# Patient Record
Sex: Female | Born: 1955 | Race: White | Hispanic: No | Marital: Single | Smoking: Former smoker
Health system: Southern US, Community
[De-identification: ages and names within clinical notes are randomized; demographics above are authoritative.]

## PROBLEM LIST (undated history)

## (undated) DIAGNOSIS — M1990 Unspecified osteoarthritis, unspecified site: Secondary | ICD-10-CM

## (undated) DIAGNOSIS — I471 Supraventricular tachycardia, unspecified: Secondary | ICD-10-CM

## (undated) DIAGNOSIS — M810 Age-related osteoporosis without current pathological fracture: Secondary | ICD-10-CM

## (undated) DIAGNOSIS — Z973 Presence of spectacles and contact lenses: Secondary | ICD-10-CM

## (undated) DIAGNOSIS — K219 Gastro-esophageal reflux disease without esophagitis: Secondary | ICD-10-CM

## (undated) DIAGNOSIS — D649 Anemia, unspecified: Secondary | ICD-10-CM

## (undated) DIAGNOSIS — T7840XA Allergy, unspecified, initial encounter: Secondary | ICD-10-CM

## (undated) DIAGNOSIS — N951 Menopausal and female climacteric states: Secondary | ICD-10-CM

## (undated) DIAGNOSIS — M797 Fibromyalgia: Secondary | ICD-10-CM

## (undated) DIAGNOSIS — Z9221 Personal history of antineoplastic chemotherapy: Secondary | ICD-10-CM

## (undated) DIAGNOSIS — K279 Peptic ulcer, site unspecified, unspecified as acute or chronic, without hemorrhage or perforation: Secondary | ICD-10-CM

## (undated) DIAGNOSIS — I499 Cardiac arrhythmia, unspecified: Secondary | ICD-10-CM

## (undated) DIAGNOSIS — I10 Essential (primary) hypertension: Secondary | ICD-10-CM

## (undated) DIAGNOSIS — B1920 Unspecified viral hepatitis C without hepatic coma: Secondary | ICD-10-CM

## (undated) DIAGNOSIS — Z5189 Encounter for other specified aftercare: Secondary | ICD-10-CM

## (undated) DIAGNOSIS — F17200 Nicotine dependence, unspecified, uncomplicated: Secondary | ICD-10-CM

## (undated) DIAGNOSIS — F419 Anxiety disorder, unspecified: Secondary | ICD-10-CM

## (undated) DIAGNOSIS — G709 Myoneural disorder, unspecified: Secondary | ICD-10-CM

## (undated) DIAGNOSIS — F32A Depression, unspecified: Secondary | ICD-10-CM

## (undated) DIAGNOSIS — J449 Chronic obstructive pulmonary disease, unspecified: Secondary | ICD-10-CM

## (undated) DIAGNOSIS — M069 Rheumatoid arthritis, unspecified: Secondary | ICD-10-CM

## (undated) DIAGNOSIS — F329 Major depressive disorder, single episode, unspecified: Secondary | ICD-10-CM

## (undated) DIAGNOSIS — C801 Malignant (primary) neoplasm, unspecified: Secondary | ICD-10-CM

## (undated) HISTORY — PX: ABDOMINAL HYSTERECTOMY: SHX81

## (undated) HISTORY — DX: Rheumatoid arthritis, unspecified: M06.9

## (undated) HISTORY — DX: Myoneural disorder, unspecified: G70.9

## (undated) HISTORY — DX: Encounter for other specified aftercare: Z51.89

## (undated) HISTORY — DX: Anxiety disorder, unspecified: F41.9

## (undated) HISTORY — DX: Fibromyalgia: M79.7

## (undated) HISTORY — DX: Unspecified osteoarthritis, unspecified site: M19.90

## (undated) HISTORY — PX: COLONOSCOPY: SHX174

## (undated) HISTORY — DX: Depression, unspecified: F32.A

## (undated) HISTORY — DX: Supraventricular tachycardia, unspecified: I47.10

## (undated) HISTORY — PX: DILATION AND CURETTAGE OF UTERUS: SHX78

## (undated) HISTORY — DX: Peptic ulcer, site unspecified, unspecified as acute or chronic, without hemorrhage or perforation: K27.9

## (undated) HISTORY — DX: Anemia, unspecified: D64.9

## (undated) HISTORY — PX: JOINT REPLACEMENT: SHX530

## (undated) HISTORY — DX: Menopausal and female climacteric states: N95.1

## (undated) HISTORY — DX: Major depressive disorder, single episode, unspecified: F32.9

## (undated) HISTORY — DX: Allergy, unspecified, initial encounter: T78.40XA

## (undated) HISTORY — PX: UPPER GI ENDOSCOPY: SHX6162

## (undated) HISTORY — DX: Gastro-esophageal reflux disease without esophagitis: K21.9

## (undated) HISTORY — DX: Supraventricular tachycardia: I47.1

## (undated) HISTORY — DX: Age-related osteoporosis without current pathological fracture: M81.0

## (undated) HISTORY — PX: BREAST SURGERY: SHX581

---
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
## 1984-03-20 HISTORY — PX: NASAL SINUS SURGERY: SHX719

## 1998-05-26 ENCOUNTER — Encounter: Payer: Self-pay | Admitting: Cardiology

## 1998-05-26 ENCOUNTER — Ambulatory Visit (HOSPITAL_COMMUNITY): Admission: RE | Admit: 1998-05-26 | Discharge: 1998-05-26 | Payer: Self-pay | Admitting: Cardiology

## 1998-10-29 ENCOUNTER — Emergency Department (HOSPITAL_COMMUNITY): Admission: EM | Admit: 1998-10-29 | Discharge: 1998-10-29 | Payer: Self-pay | Admitting: Emergency Medicine

## 1999-06-29 ENCOUNTER — Ambulatory Visit (HOSPITAL_COMMUNITY): Admission: RE | Admit: 1999-06-29 | Discharge: 1999-06-29 | Payer: Self-pay | Admitting: Cardiology

## 1999-06-29 ENCOUNTER — Encounter: Payer: Self-pay | Admitting: Cardiology

## 1999-08-15 ENCOUNTER — Ambulatory Visit (HOSPITAL_COMMUNITY): Admission: RE | Admit: 1999-08-15 | Discharge: 1999-08-15 | Payer: Self-pay | Admitting: Cardiology

## 1999-08-15 ENCOUNTER — Encounter: Payer: Self-pay | Admitting: Cardiology

## 1999-08-27 ENCOUNTER — Other Ambulatory Visit: Admission: RE | Admit: 1999-08-27 | Discharge: 1999-08-27 | Payer: Self-pay | Admitting: Obstetrics and Gynecology

## 2000-07-11 ENCOUNTER — Other Ambulatory Visit: Admission: RE | Admit: 2000-07-11 | Discharge: 2000-07-11 | Payer: Self-pay | Admitting: *Deleted

## 2000-07-11 ENCOUNTER — Encounter (INDEPENDENT_AMBULATORY_CARE_PROVIDER_SITE_OTHER): Payer: Self-pay | Admitting: Specialist

## 2000-09-17 HISTORY — PX: OTHER SURGICAL HISTORY: SHX169

## 2000-09-30 ENCOUNTER — Other Ambulatory Visit: Admission: RE | Admit: 2000-09-30 | Discharge: 2000-09-30 | Payer: Self-pay | Admitting: *Deleted

## 2000-09-30 ENCOUNTER — Encounter (INDEPENDENT_AMBULATORY_CARE_PROVIDER_SITE_OTHER): Payer: Self-pay

## 2002-05-18 ENCOUNTER — Ambulatory Visit (HOSPITAL_COMMUNITY): Admission: RE | Admit: 2002-05-18 | Discharge: 2002-05-18 | Payer: Self-pay | Admitting: *Deleted

## 2002-05-18 ENCOUNTER — Encounter: Payer: Self-pay | Admitting: *Deleted

## 2002-06-25 ENCOUNTER — Encounter: Payer: Self-pay | Admitting: *Deleted

## 2002-06-25 ENCOUNTER — Ambulatory Visit (HOSPITAL_COMMUNITY): Admission: RE | Admit: 2002-06-25 | Discharge: 2002-06-25 | Payer: Self-pay | Admitting: *Deleted

## 2002-06-26 ENCOUNTER — Inpatient Hospital Stay (HOSPITAL_COMMUNITY): Admission: EM | Admit: 2002-06-26 | Discharge: 2002-06-29 | Payer: Self-pay | Admitting: Emergency Medicine

## 2002-07-20 ENCOUNTER — Encounter: Admission: RE | Admit: 2002-07-20 | Discharge: 2002-07-20 | Payer: Self-pay | Admitting: Sports Medicine

## 2002-09-18 ENCOUNTER — Encounter (INDEPENDENT_AMBULATORY_CARE_PROVIDER_SITE_OTHER): Payer: Self-pay | Admitting: *Deleted

## 2002-10-14 ENCOUNTER — Encounter (INDEPENDENT_AMBULATORY_CARE_PROVIDER_SITE_OTHER): Payer: Self-pay | Admitting: Family Medicine

## 2002-10-14 ENCOUNTER — Encounter: Admission: RE | Admit: 2002-10-14 | Discharge: 2002-10-14 | Payer: Self-pay | Admitting: Family Medicine

## 2002-10-28 ENCOUNTER — Encounter: Payer: Self-pay | Admitting: Sports Medicine

## 2002-10-28 ENCOUNTER — Encounter: Admission: RE | Admit: 2002-10-28 | Discharge: 2002-10-28 | Payer: Self-pay | Admitting: Sports Medicine

## 2003-11-18 DIAGNOSIS — N951 Menopausal and female climacteric states: Secondary | ICD-10-CM

## 2003-11-18 HISTORY — DX: Menopausal and female climacteric states: N95.1

## 2003-12-02 ENCOUNTER — Encounter: Admission: RE | Admit: 2003-12-02 | Discharge: 2003-12-02 | Payer: Self-pay | Admitting: Family Medicine

## 2004-04-16 ENCOUNTER — Encounter: Admission: RE | Admit: 2004-04-16 | Discharge: 2004-04-16 | Payer: Self-pay | Admitting: Cardiology

## 2004-06-14 ENCOUNTER — Other Ambulatory Visit: Admission: RE | Admit: 2004-06-14 | Discharge: 2004-06-14 | Payer: Self-pay | Admitting: *Deleted

## 2005-09-03 ENCOUNTER — Encounter: Admission: RE | Admit: 2005-09-03 | Discharge: 2005-09-03 | Payer: Self-pay | Admitting: Cardiology

## 2006-01-02 ENCOUNTER — Other Ambulatory Visit: Admission: RE | Admit: 2006-01-02 | Discharge: 2006-01-02 | Payer: Self-pay | Admitting: *Deleted

## 2006-01-05 ENCOUNTER — Encounter: Admission: RE | Admit: 2006-01-05 | Discharge: 2006-01-05 | Payer: Self-pay | Admitting: Orthopedic Surgery

## 2006-01-22 ENCOUNTER — Encounter: Admission: RE | Admit: 2006-01-22 | Discharge: 2006-01-22 | Payer: Self-pay | Admitting: *Deleted

## 2006-07-17 DIAGNOSIS — K219 Gastro-esophageal reflux disease without esophagitis: Secondary | ICD-10-CM | POA: Insufficient documentation

## 2006-07-17 DIAGNOSIS — B1710 Acute hepatitis C without hepatic coma: Secondary | ICD-10-CM

## 2006-07-17 DIAGNOSIS — B1910 Unspecified viral hepatitis B without hepatic coma: Secondary | ICD-10-CM | POA: Insufficient documentation

## 2006-07-18 ENCOUNTER — Encounter (INDEPENDENT_AMBULATORY_CARE_PROVIDER_SITE_OTHER): Payer: Self-pay | Admitting: *Deleted

## 2007-02-16 ENCOUNTER — Emergency Department (HOSPITAL_COMMUNITY): Admission: EM | Admit: 2007-02-16 | Discharge: 2007-02-16 | Payer: Self-pay | Admitting: Emergency Medicine

## 2007-02-20 ENCOUNTER — Ambulatory Visit: Payer: Self-pay | Admitting: Cardiology

## 2007-03-16 ENCOUNTER — Ambulatory Visit: Payer: Self-pay

## 2007-03-16 ENCOUNTER — Encounter: Payer: Self-pay | Admitting: Cardiology

## 2007-04-10 ENCOUNTER — Ambulatory Visit: Payer: Self-pay | Admitting: Cardiology

## 2008-04-07 ENCOUNTER — Ambulatory Visit: Payer: Self-pay | Admitting: Cardiology

## 2008-07-23 ENCOUNTER — Emergency Department (HOSPITAL_COMMUNITY): Admission: EM | Admit: 2008-07-23 | Discharge: 2008-07-23 | Payer: Self-pay | Admitting: Emergency Medicine

## 2009-01-12 ENCOUNTER — Encounter (INDEPENDENT_AMBULATORY_CARE_PROVIDER_SITE_OTHER): Payer: Self-pay | Admitting: *Deleted

## 2009-01-16 ENCOUNTER — Telehealth: Payer: Self-pay | Admitting: Cardiology

## 2009-01-20 ENCOUNTER — Ambulatory Visit: Payer: Self-pay | Admitting: Oncology

## 2009-02-02 LAB — ERYTHROCYTE SEDIMENTATION RATE: Sed Rate: 32 mm/hr — ABNORMAL HIGH (ref 0–30)

## 2009-02-02 LAB — CBC WITH DIFFERENTIAL/PLATELET
BASO%: 0.2 % (ref 0.0–2.0)
HCT: 37.1 % (ref 34.8–46.6)
MCHC: 34.2 g/dL (ref 31.5–36.0)
MONO#: 0.5 10*3/uL (ref 0.1–0.9)
NEUT%: 65.6 % (ref 38.4–76.8)
RBC: 4.38 10*6/uL (ref 3.70–5.45)
WBC: 10.7 10*3/uL — ABNORMAL HIGH (ref 3.9–10.3)
lymph#: 3 10*3/uL (ref 0.9–3.3)

## 2009-02-02 LAB — CHCC SMEAR

## 2009-02-03 LAB — COMPREHENSIVE METABOLIC PANEL
ALT: 15 U/L (ref 0–35)
Albumin: 4.1 g/dL (ref 3.5–5.2)
CO2: 23 mEq/L (ref 19–32)
Calcium: 9 mg/dL (ref 8.4–10.5)
Chloride: 104 mEq/L (ref 96–112)
Potassium: 3.8 mEq/L (ref 3.5–5.3)
Sodium: 138 mEq/L (ref 135–145)
Total Bilirubin: 0.5 mg/dL (ref 0.3–1.2)
Total Protein: 7 g/dL (ref 6.0–8.3)

## 2009-02-03 LAB — JAK2 GENOTYPR: JAK2 GenotypR: NOT DETECTED

## 2009-02-28 ENCOUNTER — Ambulatory Visit: Payer: Self-pay | Admitting: Oncology

## 2009-03-02 LAB — IRON AND TIBC
%SAT: 19 % — ABNORMAL LOW (ref 20–55)
Iron: 68 ug/dL (ref 42–145)

## 2009-03-02 LAB — CBC WITH DIFFERENTIAL/PLATELET
Basophils Absolute: 0 10*3/uL (ref 0.0–0.1)
Eosinophils Absolute: 0.3 10*3/uL (ref 0.0–0.5)
HGB: 12.9 g/dL (ref 11.6–15.9)
MCV: 84.9 fL (ref 79.5–101.0)
MONO%: 5.2 % (ref 0.0–14.0)
NEUT#: 5.4 10*3/uL (ref 1.5–6.5)
Platelets: 316 10*3/uL (ref 145–400)
RDW: 13.1 % (ref 11.2–14.5)

## 2009-03-14 ENCOUNTER — Ambulatory Visit: Payer: Self-pay | Admitting: Gastroenterology

## 2009-03-14 ENCOUNTER — Encounter (INDEPENDENT_AMBULATORY_CARE_PROVIDER_SITE_OTHER): Payer: Self-pay | Admitting: *Deleted

## 2009-03-30 ENCOUNTER — Telehealth (INDEPENDENT_AMBULATORY_CARE_PROVIDER_SITE_OTHER): Payer: Self-pay | Admitting: *Deleted

## 2009-04-06 ENCOUNTER — Ambulatory Visit: Payer: Self-pay | Admitting: Gastroenterology

## 2009-04-06 ENCOUNTER — Ambulatory Visit (HOSPITAL_COMMUNITY): Admission: RE | Admit: 2009-04-06 | Discharge: 2009-04-06 | Payer: Self-pay | Admitting: Gastroenterology

## 2009-04-10 ENCOUNTER — Encounter: Payer: Self-pay | Admitting: Gastroenterology

## 2009-06-16 ENCOUNTER — Ambulatory Visit: Payer: Self-pay | Admitting: Cardiology

## 2009-06-16 DIAGNOSIS — IMO0001 Reserved for inherently not codable concepts without codable children: Secondary | ICD-10-CM

## 2009-06-16 DIAGNOSIS — I471 Supraventricular tachycardia: Secondary | ICD-10-CM | POA: Insufficient documentation

## 2009-06-16 DIAGNOSIS — F17200 Nicotine dependence, unspecified, uncomplicated: Secondary | ICD-10-CM | POA: Insufficient documentation

## 2009-07-06 ENCOUNTER — Ambulatory Visit (HOSPITAL_COMMUNITY): Admission: RE | Admit: 2009-07-06 | Discharge: 2009-07-06 | Payer: Self-pay | Admitting: Cardiology

## 2009-07-06 ENCOUNTER — Ambulatory Visit: Payer: Self-pay

## 2009-07-06 ENCOUNTER — Ambulatory Visit: Payer: Self-pay | Admitting: Cardiovascular Disease

## 2009-07-06 ENCOUNTER — Encounter: Payer: Self-pay | Admitting: Cardiology

## 2010-03-22 ENCOUNTER — Telehealth: Payer: Self-pay | Admitting: Cardiology

## 2010-05-24 ENCOUNTER — Ambulatory Visit: Admit: 2010-05-24 | Payer: Self-pay | Admitting: Cardiology

## 2010-06-19 NOTE — Assessment & Plan Note (Signed)
Summary: f1y/dm  Medications Added FERROUS SULFATE 325 (65 FE) MG TABS (FERROUS SULFATE) as needed MIRALAX  POWD (POLYETHYLENE GLYCOL 3350) as needed CITRUCEL  POWD (METHYLCELLULOSE (LAXATIVE)) as needed        Referring Provider:  n/a Primary Provider:  Abbe Amsterdam, MD    History of Present Illness: [name] is a pleasant  female who I have seen in the pastsecondary to a documented SVT.  She apparently had an episode in the ambulance and was documented to be as high as 200, but the strips are not available. Stress echocardiogram in October 2008 showed normal LV function and there was no ischemia. Previous monitor in October of 2008 showed sinus with occasional PACs and PVCs. I last saw her in November of 2009. Since then she apparently has been diagnosed with fibromyalgia. There is also a question of rheumatoid arthritis. She has significant dyspnea on exertion by her report. It is relieved with rest. It is not associated with chest pain. There is no orthopnea, PND or pedal edema. She also complains of significant fatigue. Note she occasionally feels her heart "skipping" but she has not had sustained palpitations similar to her SVT symptoms.  Current Medications (verified): 1)  Atenolol 50 Mg Tabs (Atenolol) .... One Tablet By Mouth Two Times A Day 2)  Savella 50 Mg Tabs (Milnacipran Hcl) .... One Tablet By Mouth Two Times A Day 3)  Xanax 0.5 Mg Tabs (Alprazolam) .... One Tablet By Mouth Two Times A Day 4)  Oxycodone-Acetaminophen 7.5-325 Mg Tabs (Oxycodone-Acetaminophen) .... One Tablet By Mouth Four Times Daily 5)  Ferrous Sulfate 325 (65 Fe) Mg Tabs (Ferrous Sulfate) .... As Needed 6)  Vitamin D 1000 Unit Tabs (Cholecalciferol) .... One Tablet By Mouth Three Times A Day 7)  Senior Multivitamin Plus  Tabs (Multiple Vitamins-Minerals) .... One Tablet By Mouth Once Daily 8)  Prilosec Otc 20 Mg Tbec (Omeprazole Magnesium) .... One Tablet By Mouth Once Daily 9)  Ibuprofen 200 Mg Tabs  (Ibuprofen) .... As Needed 10)  Aleve 220 Mg Tabs (Naproxen Sodium) .... As Needed 11)  Senna-Plus 8.6-50 Mg Tabs (Sennosides-Docusate Sodium) .... As Needed For Constipaiton 12)  Lyrica 50 Mg Caps (Pregabalin) .Marland Kitchen.. 1 By Mouth Two Times A Day 13)  Miralax  Powd (Polyethylene Glycol 3350) .... As Needed 14)  Citrucel  Powd (Methylcellulose (Laxative)) .... As Needed  Allergies: 1)  ! 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 Normand Sloop with  6-pack on weekend); H/o 12-pack/day EtOH abuse in 1993; Had prescription drug abuse problem  1993; Has 2 dogs and 5 cats; Did inpatient substance abuse rehab in 1993    Review of Systems       Patient complain of fatigue and symptoms of fibromyalgia but no  fevers or chills, productive cough, hemoptysis, dysphasia, odynophagia, melena, hematochezia, dysuria, hematuria, rash, seizure activity, orthopnea, PND, pedal edema, claudication. Remaining systems are negative.   Vital Signs:  Patient profile:   55 year old female Height:      64 inches Weight:      170 pounds BMI:      29.29 Pulse rate:   73 / minute Resp:     14 per minute BP sitting:   102 / 70  (left arm)  Vitals Entered By: Kem Parkinson (June 16, 2009 10:06 AM)  Physical Exam  General:  Well-developed well-nourished in no acute distress.  Skin is warm and dry.  HEENT is normal.  Neck is supple. No thyromegaly.  Chest is clear to auscultation with normal expansion.  Cardiovascular exam is regular rate and rhythm.  Abdominal exam nontender or distended. No masses palpated. Extremities show no edema. neuro grossly intact    EKG  Procedure date:  06/16/2009  Findings:      Sinus rhythm at a rate of 73. Axis normal. No ST changes noted.  Impression & Recommendations:  Problem # 1:  PSVT (ICD-427.0) Symptoms are reasonably well controlled on atenolol. Will continue. If they worsen in the future then we will consider a repeat monitor. Her updated medication list for this problem includes:    Atenolol 50 Mg Tabs (Atenolol) ..... One tablet by mouth two times a day  Problem # 2:  TOBACCO ABUSE (ICD-305.1) Patient counseled on discontinuing for between 3-10 minutes.  Problem # 3:  DYSPNEA (ICD-786.05) This may be related to deconditioning. She is not volume overloaded on examination. Check echocardiogram to quantify LV function. Her updated medication list for this problem includes:    Atenolol 50 Mg Tabs (Atenolol) ..... One tablet by mouth two times a day  Problem # 4:  FIBROMYALGIA (ICD-729.1) Management per primary care.  Problem # 5:  HEPATITIS C (ICD-070.51)  Problem # 6:  GASTROESOPHAGEAL REFLUX, NO ESOPHAGITIS (ICD-530.81)  Her updated medication list for this problem includes:    Prilosec Otc 20 Mg Tbec (Omeprazole magnesium) ..... One tablet by mouth once daily  Other Orders: Echocardiogram (Echo)  Patient Instructions: 1)  Your physician recommends that you schedule a follow-up appointment in: ONE YEAR 2)  Your physician has requested that you have an  echocardiogram.  Echocardiography is a painless test that uses sound waves to create images of your heart. It provides your doctor with information about the size and shape of your heart and how well your heart's chambers and valves are working.  This procedure takes approximately one hour. There are no restrictions for this procedure.

## 2010-06-19 NOTE — Progress Notes (Signed)
Summary: SOB   Phone Note Call from Patient Call back at [facility] Phone [phone]   Caller: Patient Summary of Call: SOB Initial call taken by: Judie Grieve,  March 22, 2010 2:45 PM  Follow-up for Phone Call        lmtcb Scherrie Bateman, LPN  March 27, 2010 5:08 PM SPOKE WITH PT MADE APPT WITH DR Jens Som FOR JAN 2012  STATES MOTHER JUST EXPIRED ATTRIBUTES COMPLAINTS TO  THE LOSS. INSTRUCTED IF S/S WORSEN TO CALL BACK TO GET EARLIER APPT PT VERBALIZED UNDERSTANDING. Follow-up by: Scherrie Bateman, LPN,  March 30, 2010 9:15 AM  Additional Follow-up for Phone Call Additional follow up Details #1::        ok Ferman Hamming, MD, [facility]  March 30, 2010 9:17 AM

## 2010-07-17 ENCOUNTER — Encounter: Payer: Self-pay | Admitting: Cardiology

## 2010-07-17 ENCOUNTER — Ambulatory Visit (INDEPENDENT_AMBULATORY_CARE_PROVIDER_SITE_OTHER): Payer: Self-pay | Admitting: Cardiology

## 2010-07-17 DIAGNOSIS — R0602 Shortness of breath: Secondary | ICD-10-CM

## 2010-07-17 DIAGNOSIS — I471 Supraventricular tachycardia: Secondary | ICD-10-CM

## 2010-07-26 NOTE — Assessment & Plan Note (Signed)
Summary: per check [email or web address]  Medications Added XANAX 0.5 MG TABS (ALPRAZOLAM) one tablet by mouth three times a day OXYCODONE-ACETAMINOPHEN 10-325 MG TABS (OXYCODONE-ACETAMINOPHEN) 4 times daily LYRICA 100 MG CAPS (PREGABALIN) 1 tab by mouth two times a day ENBREL 50 MG/ML SOLN (ETANERCEPT) weekly        Referring Provider:  n/a Primary Provider:  Abbe Amsterdam, MD   CC:  sob.  History of Present Illness: [name] is a pleasant  female who I have seen in the pastsecondary to a documented SVT.  She apparently had an episode in the ambulance and was documented to be as high as 200, but the strips are not available. Stress echocardiogram in October 2008 showed normal LV function and there was no ischemia. Previous monitor in October of 2008 showed sinus with occasional PACs and PVCs. Echocardiogram in February of 2011 showed normal LV function. I last saw her in Jan 2011. Since then, the patient describes continuing dyspnea both with exertion and at rest. There is no orthopnea or PND. She does not have exertional chest pain. She has had 2 brief episodes of palpitations but her SVT is reasonably well controlled.  Current Medications (verified): 1)  Atenolol 50 Mg Tabs (Atenolol) .... One Tablet By Mouth Two Times A Day 2)  Xanax 0.5 Mg Tabs (Alprazolam) .... One Tablet By Mouth Three Times A Day 3)  Oxycodone-Acetaminophen 10-325 Mg Tabs (Oxycodone-Acetaminophen) .... 4 Times Daily 4)  Ferrous Sulfate 325 (65 Fe) Mg Tabs (Ferrous Sulfate) .... As Needed 5)  Vitamin D 1000 Unit Tabs (Cholecalciferol) .... One Tablet By Mouth Three Times A Day 6)  Senior Multivitamin Plus  Tabs (Multiple Vitamins-Minerals) .... One Tablet By Mouth Once Daily 7)  Prilosec Otc 20 Mg Tbec (Omeprazole Magnesium) .... One Tablet By Mouth Once Daily 8)  Ibuprofen 200 Mg Tabs (Ibuprofen) .... As Needed 9)  Aleve 220 Mg Tabs (Naproxen Sodium) .... As Needed 10)  Senna-Plus 8.6-50 Mg Tabs  (Sennosides-Docusate Sodium) .... As Needed For Constipaiton 11)  Lyrica 100 Mg Caps (Pregabalin) .Marland Kitchen.. 1 Tab By Mouth Two Times A Day 12)  Miralax  Powd (Polyethylene Glycol 3350) .... As Needed 13)  Citrucel  Powd (Methylcellulose (Laxative)) .... As Needed 14)  Enbrel 50 Mg/ml Soln (Etanercept) .... Weekly  Allergies: 1)  ! 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 C-section - 09/17/1980, D&C -, Hysterectomy--for dysmenorrhea - 09/17/2000, Sinus surgery for deviated septum - 05/21/1983, Tonsillectomy - 05/20/1961  Social History: Reviewed history from 03/14/2009 and no changes required. 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 Normand Sloop with  6-pack on weekend); H/o 12-pack/day EtOH abuse in 1993; Had prescription drug abuse problem  1993; Has 2 dogs and 5 cats; Did inpatient substance abuse rehab in 1993    Review of Systems       Multiple complaints including dyspnea, fibromyalgia but no fevers or chills, productive cough, hemoptysis, dysphasia, odynophagia,  melena, hematochezia, dysuria, hematuria, rash, seizure activity, orthopnea, PND, pedal edema, claudication. Remaining systems are negative.   Vital Signs:  Patient profile:   55 year old female Height:      64 inches Weight:      179 pounds BMI:      30.84 Pulse rate:   66 / minute Resp:     14 per minute BP sitting:   115 / 70  (right arm)  Vitals Entered By: Kem Parkinson (July 17, 2010 12:27 PM)  Physical Exam  General:  Well-developed well-nourished in no acute distress.  Skin is warm and dry.  HEENT is normal.  Neck is supple. No thyromegaly.  Chest is clear to auscultation with normal expansion.  Cardiovascular exam is regular rate and rhythm.  Abdominal exam nontender or distended. No masses palpated. Extremities show no edema. neuro grossly intact    EKG  Procedure date:  07/17/2010  Findings:      Sinus rhythm at a rate of 66. Minor nonspecific ST changes.  Impression & Recommendations:  Problem # 1:  DYSPNEA (ICD-786.05)  LV function normal. Not volume overloaded on exam. I do not think is cardiac related. Her updated medication list for this problem includes:    Atenolol 50 Mg Tabs (Atenolol) ..... One tablet by mouth two times a day  Her updated medication list for this problem includes:    Atenolol 50 Mg Tabs (Atenolol) ..... One tablet by mouth two times a day  Problem # 2:  TOBACCO ABUSE (ICD-305.1) Patient counseled on discontinuing.  Problem # 3:  FIBROMYALGIA (ICD-729.1) Management per primary care.  Problem # 4:  PSVT (ICD-427.0)  Continue beta blocker. Her updated medication list for this problem includes:    Atenolol 50 Mg Tabs (Atenolol) ..... One tablet by mouth two times a day  Her updated medication list for this problem includes:    Atenolol 50 Mg Tabs (Atenolol) ..... One tablet by mouth two times a day  Patient Instructions: 1)  Your physician wants you to follow-up in: ONE YEAR   You will receive a reminder letter in the mail two months in advance. If you don't receive a letter, please call our office to schedule the follow-up appointment. Prescriptions: ATENOLOL 50 MG TABS (ATENOLOL) one tablet by mouth two times a day  #180 x 4   Entered by:   Deliah Goody, RN    Authorized by:   Ferman Hamming, MD, [facility]   Signed by:   Deliah Goody, RN on 07/17/2010   Method used:   Electronically to        [facility] (retail)       [address]       [place], [place]  [address]       Ph: [phone] or [phone]       Fax: [phone]   RxID:   [id number]

## 2010-08-22 LAB — DIFFERENTIAL
Basophils Absolute: 0 10*3/uL (ref 0.0–0.1)
Basophils Relative: 0 % (ref 0–1)
Neutro Abs: 5.8 10*3/uL (ref 1.7–7.7)
Neutrophils Relative %: 53 % (ref 43–77)

## 2010-08-22 LAB — CBC
MCHC: 34.1 g/dL (ref 30.0–36.0)
RDW: 14 % (ref 11.5–15.5)

## 2010-08-30 LAB — URINALYSIS, ROUTINE W REFLEX MICROSCOPIC
Glucose, UA: NEGATIVE mg/dL
Hgb urine dipstick: NEGATIVE
Specific Gravity, Urine: 1.007 (ref 1.005–1.030)
pH: 5.5 (ref 5.0–8.0)

## 2010-08-30 LAB — BODY FLUID CULTURE: Culture: NO GROWTH

## 2010-08-30 LAB — DIFFERENTIAL
Eosinophils Relative: 2 % (ref 0–5)
Lymphocytes Relative: 21 % (ref 12–46)
Lymphs Abs: 2.9 10*3/uL (ref 0.7–4.0)
Neutro Abs: 10.2 10*3/uL — ABNORMAL HIGH (ref 1.7–7.7)

## 2010-08-30 LAB — BASIC METABOLIC PANEL
BUN: 12 mg/dL (ref 6–23)
Calcium: 9.3 mg/dL (ref 8.4–10.5)
GFR calc non Af Amer: >60 mL/min (ref >60)
Potassium: 4.6 mEq/L (ref 3.5–5.1)
Sodium: 140 mEq/L (ref 135–145)

## 2010-08-30 LAB — SYNOVIAL CELL COUNT + DIFF, W/ CRYSTALS
Crystals, Fluid: NONE SEEN
Lymphocytes-Synovial Fld: 2 % (ref 0–20)

## 2010-08-30 LAB — LACTATE DEHYDROGENASE, PLEURAL OR PERITONEAL FLUID: LD, Fluid: 265 U/L — ABNORMAL HIGH (ref 3–23)

## 2010-08-30 LAB — CBC
HCT: 42 % (ref 36.0–46.0)
Hemoglobin: 14.7 g/dL (ref 12.0–15.0)
Platelets: 278 10*3/uL (ref 150–400)
WBC: 14.1 10*3/uL — ABNORMAL HIGH (ref 4.0–10.5)

## 2010-08-30 LAB — PROTEIN, BODY FLUID: Total protein, fluid: 3 g/dL

## 2010-10-02 NOTE — Assessment & Plan Note (Signed)
[facility] OFFICE NOTE   NAME:FIELDSAnalie, [name]                        MRN:          [id number]  DATE:04/10/2007                            DOB:          Mar 05, 1956    [name] is a very pleasant 55 year old female who I recently saw on  February 20, 2007.  She has a history of what sounds to be SVT.  On  September 29, she apparently had an episode and was taken to the  emergency room at [facility].  She had documented SVT per the  EMS record, but I do not have those strips available.  It broke before  she reached the hospital.  Since I last saw her, we did schedule her to  have a stress echocardiogram which was performed on March 16, 2007.  The stress echo was negative based on the dictated report by Dr.  Dietrich Pates.  She also had a transthoracic echocardiogram on October 27  that showed normal left ventricular function and no other significant  abnormalities were noted.  A CardioNet monitor showed normal sinus  rhythm with premature atrial contractions and premature ventricular  contractions, but there were no sustained arrhythmias.  However, note  she did not have any episodes of sustained palpitations during that  time.  Since I saw her, she occasionally feels her heart skip, but  there were no sustained arrhythmias.   MEDICATIONS:  1. Atenolol 25 mg p.o. b.i.d.  2. Prilosec over the counter.   PHYSICAL EXAMINATION:  Blood pressure of 126/90 and pulse was 78.  She  weighs 155 pounds.  Her HEENT is normal.  Her neck is supple.  Her chest is clear.  CARDIOVASCULAR EXAM:  Reveals a regular rate and rhythm.  ABDOMINAL EXAM:  Shows no tenderness.  EXTREMITIES:  Show no edema.   DIAGNOSES:  1. Probable supraventricular tachycardia:  [name] had no sustained      arrhythmias while she had her CardioNet monitor.  However,      description is consistent with supraventricular tachycardia, and  she apparently had this documented en route to [facility]      with previous episode.  We were not able to obtain those strips.      We have discussed the options today, and we have decided to      increase her atenolol to 50 mg in the morning and 25 in the      evening.  If her symptoms worsen in the future, then we could      consider ablation at that time.  If she does have these symptoms      without resolving, we would like for her to be seen for an      electrocardiogram or rhythm strip to document her arrhythmia.  2. History of peptic ulcer disease:  She will continue on Prilosec.  3. Tobacco abuse:  We discussed the importance of discontinuing this.   I will see her back in approximately 6 months.     Madolyn Frieze Jens Som, MD, [facility]  Electronically  Signed    BSC/MedQ  DD: 04/10/2007  DT: 04/10/2007  Job #: [id number]   cc:   Pearline Cables, MD

## 2010-10-02 NOTE — Assessment & Plan Note (Signed)
[facility] OFFICE NOTE   NAME:FIELDSRetaj, [name]                        MRN:          [id number]  DATE:02/20/2007                            DOB:          February 08, 1956    [name] Crawford is a very pleasant 55 year old female who we are asked to  evaluate for supraventricular tachycardia.   The patient states that since age 44, she has had intermittent  palpitations.  They are sudden in onset and described as her heart  racing.  Her heart rate can increase to 200 beats per minute by her  report.  She states she can usually control this with deep breathing and  it resolves after several minutes.  She has seen Dr. Smitty Cords in cardiology  in the past and has been treated with beta-blockers including Inderal  and is now on atenolol.  However, she had a recent episode on February 16, 2007, where her symptoms would not resolve.  There was associated  chest tightness as well as dyspnea and mild presyncope.  There was no  frank syncope or pedal edema.  She called EMS but en route her SVT  resolved.  I do not have any of the strips available.  Note, in the emergency room her hemoglobin was normal as was were her  electrolytes.  She also had a negative CK-MB and troponin I x1.  A chest  x-ray showed chronic changes but there was no active disease.  Because  of her SVT, she was referred to [place].  Note, she otherwise does not have  significant dyspnea on exertion, orthopnea, PND, pedal edema,  presyncope, syncope, exertional chest pain.  Also note there is no  relation of her palpitations to activities and they can occur at any  time.  She says they typically resolve when she takes a deep breath and  holds it by her report.   MEDICATIONS:  1. Atenolol 25 mg p.o. b.i.d.  2. Prilosec over-the-counter.  3. She recently took a Z-Pak.   ALLERGIES:  CODEINE.   FAMILY HISTORY:  Negative for rhythm disturbances or sudden death.  There is no  coronary artery disease in her immediate family.   SOCIAL HISTORY:  She does smoke and has for approximately 36 years.  She  consumes alcohol occasionally as well.   PAST MEDICAL HISTORY:  There is no diabetes mellitus, hypertension, or  hyperlipidemia.  She does have a history of:  1. Gastroesophageal reflux disease.  2. Peptic ulcer disease.  3. Hepatitis C.  4. She has had a prior hysterectomy.  5. C-section.  6. Sinus surgery.  7. She does have a history of sinusitis.   REVIEW OF SYSTEMS:  She denies any headaches, fever or chills.  She has  had a problem with sinusitis recently as well as mild malaise and  nausea.  There is no hemoptysis at present.  There is no dysphagia,  odynophagia, melena, or hematochezia.  There is no dysuria or hematuria.  There are no rashes or seizure activity.  There is no orthopnea, PND, or  pedal  edema.  The remaining systems are negative.   PHYSICAL EXAMINATION:  VITAL SIGNS:  Show a blood pressure of 110/72 and  her pulse is 74.  She weighs 146 pounds.  GENERAL:  She is well developed and well nourished in no acute distress.  SKIN:  Warm and dry.  MENTAL STATUS:  She does not appear to be depressed and there is  peripheral clubbing.  BACK:  Normal.  HEENT:  Normal, other than sinusitis.  Her eyelids are normal.  NECK:  Supple with a normal upstroke bilaterally.  I could not  appreciate bruits.  There is no jugular venous distention and no  thyromegaly is noted.  CHEST:  Clear to auscultation with normal expansion.  CARDIOVASCULAR:  Revealed a regular rate and rhythm.  Normal S1 S2.  There are no murmurs, rubs, or gallops noted.  Her PMI is nondisplaced.  ABDOMEN:  Nontender and nondistended.  Positive bowel sounds.  No  hepatosplenomegaly.  No mass appreciated. There is no abdominal bruit.  She has 2 plus femoral pulses bilaterally with no bruits.  EXTREMITIES:  Show no edema and I can palpate no cords.  She has 2 plus  dorsalis pedis pulses  bilaterally.  NEUROLOGIC:  Grossly intact.   Her electrocardiogram from February 16, 2007, shows a sinus rhythm at a  rate of 89.  There are no significant ST changes.   DIAGNOSES:  1. Supraventricular tachycardia.  [name] Crawford' description is      consistent with an supraventricular tachycardia and apparently her      rhythm strip from the ambulance showed an supraventricular      tachycardia.  We will try to obtain those.  She will continue on      her atenolol.  Given the recurrence of her symptoms and the      frequency despite beta-blockade, she will most likely benefit for      referral to one of our electrophysiologists for consideration of      ablation.  However, we need to document the rhythm first.  I will      schedule her to have a monitor to further evaluate.  She also is      complaining of chest tightness at times when she has these      symptoms.  We will schedule her for a stress echocardiogram.  I      will also check a TSH.  2. Chest pain.  As per number one, we will plan to proceed with a      stress echocardiogram.  3. History of peptic ulcer disease, reflux.  She will continue on her      Prilosec.  4. Recent sinusitis.  She just completed a course of azithromycin.      She will follow up with Dr. Patsy Lager concerning this issue.  I will      see her back in 4-6 weeks.     Madolyn Frieze Jens Som, MD, [facility]  Electronically Signed    BSC/MedQ  DD: 02/20/2007  DT: 02/20/2007  Job #: [id number]   cc:   Pearline Cables, MD

## 2010-10-02 NOTE — Cardiovascular Report (Signed)
[facility]                                ECHOCARDIOGRAM   NAME:[name], [name]                        MRN:          [id number]  DATE:03/16/2007                            DOB:          1956/02/11    DATE OF SERVICE:  March 16, 2007.   REFERRED BY:  Dr. Madolyn Frieze. Crenshaw and Dr. Shanda Bumps C Copland.   CLINICAL DATA:  This 55 year old woman with possible PSVT and chest  pain.   DESCRIPTION OF PROCEDURE:  The treadmill exercise performed to a work  load of 8.1 METS and a heart rate of 137, 81% of the patient's age-  predicted maximum.  Exercise was discontinued due to dyspnea and leg  fatigue.  No chest discomfort was reported.  Her blood pressure  increased from a resting value of 115/80 to 180/80 immediately following  exercise, a normal response.  No arrhythmias were noted.   ELECTROCARDIOGRAM:  Normal sinus rhythm.  Delayed R-wave progression.  Minor ST segment flattening.   STRESS ELECTROCARDIOGRAM:  Insignificant up-sloping ST segment  depression.   BASELINE ECHOCARDIOGRAM:  Normal changer dimension; normal aortic,  mitral and tricuspid valves.  Normal left ventricular dimension.  No  left ventricular dysfunction.  Hyperdynamic regional and global  function.   POST-EXERCISE ECHOCARDIOGRAPHIC IMAGES:  Slight increase in left  ventricular contractility.   IMPRESSION:  Negative stress echocardiogram, revealing somewhat impaired  exercise tolerance, a negative stress electrocardiogram and no  convincing echocardiographic evidence for ischemia.  Due to hyperdynamic  systolic function at baseline, the post-stress images failed to reveal  much change.     Gerrit Friends. Dietrich Pates, MD, [facility]  Electronically Signed    RMR/MedQ  DD: 03/16/2007  DT: 03/17/2007  Job #: [id number]

## 2010-10-02 NOTE — Assessment & Plan Note (Signed)
[facility]                            CARDIOLOGY OFFICE NOTE   NAME:FIELDSRoena, [name]                        MRN:          [id number]  DATE:04/07/2008                            DOB:          February 29, 1956    [name] is a pleasant 55 year old female who I last saw in October  2008.  She does have a history of SVT that we have not been able to  document.  She apparently had an episode in the ambulance and was  documented to be as high as 200, but the strips are not available.  Since I last saw her, she occasionally feels her heart skip and has  had brief palpitations that feel like her SVT, but these terminated  easily with Valsalva.  She has these approximately 2 times per month.  She has not had dyspnea, chest pain, or syncope.   Her medications include:  1. Atenolol 50 mg in the morning and 25 in the evening.  2. Prilosec.  3. Xanax 0.25 mg p.o. b.i.d.  4. Multivitamin.  5. Vitamin D.   PHYSICAL EXAMINATION:  GENERAL:  Today shows a blood pressure of 131/85  and a pulse of 76.  HEENT:  Normal.  NECK:  Supple.  CHEST:  Clear.  CARDIOVASCULAR:  Regular rate and rhythm.  ABDOMEN:  No tenderness.  EXTREMITIES:  No edema.   Her electrocardiogram shows a sinus rhythm at a rate of 78.  The axis is  normal.  There are nonspecific ST changes.   DIAGNOSES:  1. Supraventricular tachycardia - Mrs. Prest' symptoms appear to be      reasonably well controlled.  She is having mild increased      palpitations, and I will increase her atenolol to 50 mg p.o. b.i.d.      She has not had sustained arrhythmias.  If these worsen in the      future, then we could consider a CardioNet monitor and possible      referral to EP for ablation.  2. Tobacco abuse - we discussed the importance of discontinuing this      for between 3-10 minutes.  3. History of vague chest pain previously - she did have a stress      echocardiogram on March 16, 2007, that showed no  significant      ischemia.  4. History of peptic ulcer disease and reflux - she will continue on      her present medications.  5. Remote history of sinusitis.   I will see her back in 1 year or sooner as necessary.     Madolyn Frieze Jens Som, MD, [facility]  Electronically Signed    BSC/MedQ  DD: 04/07/2008  DT: 04/07/2008  Job #: [id number]

## 2010-10-05 NOTE — Discharge Summary (Signed)
NAME:  [name], [name]                           ACCOUNT NO.:  [id number]   MEDICAL RECORD NO.:  [id number]                   PATIENT TYPE:  INP   LOCATION:  3018                                 FACILITY:  [facility]   PHYSICIAN:  Alvester Morin, M.D.               DATE OF BIRTH:  1956/01/31   DATE OF ADMISSION:  06/26/2002  DATE OF DISCHARGE:  06/29/2002                                 DISCHARGE SUMMARY   DISCHARGE DIAGNOSES:  1. Anemia.  2. Hypotension.  3. Chronic hepatitis C.  4. Chronic hepatitis B.  5. Paroxysmal atrial tachycardia.  6. Hypocalcemia.  7. Thrombocytopenia.  8. Leukopenia.  9. Dyslipidemia.  10.      Tobacco use.  11.      Nausea.   DISCHARGE MEDICATIONS:  1. Viscous lidocaine 3-4 tsp. apply to lips and/or mouth q.3h. p.r.n. pain.  2. Os-Cal +D 500 mg tabs take 4 tabs p.o. with breakfast, 3 tabs p.o. with     lunch, and 3 tabs p.o. with dinner.  3. Atenolol 25 mg 1 tab p.o. b.i.d.  4. Multivitamin 1 p.o. daily.  5. Celexa 10 mg p.o. daily.   DISCHARGE INSTRUCTIONS:  Pain management with ibuprofen as needed and with  viscous lidocaine, as above, for mouth pain.  Activity as tolerated.   DISCHARGE DIET:  Balanced diet with good calcium intake (dairy products,  calcium fortified OJ).   SPECIAL INSTRUCTIONS:  Call the office or return to the emergency room if  develops extreme weakness, lethargy, muscle spasm, seizure activity,  bleeding, fainting, or otherwise feels worse.  Follow up Tuesday, July 20, 2002, 3 p.m. at the [facility] with Georgina Peer, M.D.   CONSULTATIONS:  None.   PROCEDURES:  Transfusion/infusion of packed red blood cells.   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  In October 2003 the  patient began treatment with pegylated Interferon and  ribavirin.  Since  beginning treatment, the patient reported that  the Interferon injections  usually were followed by 2-3 days of nausea, diarrhea, myalgias, and  sleepiness which then resolved only to return following the next weekly  Interferon injection.  However, the patient reports that since her last  Interferon injection which was prior to admission, she had no respite from  her symptoms such as nausea, diarrhea, myalgias, or weakness.  Over the  course of eight days she became progressively weaker, feeling faint and not  like herself.  She reported diarrhea after eating which was nonbloody and  with no foul odor.  Two days prior to admission she saw her  gastroenterologist Dortha Kern, M.D. who encouraged her to come to the  emergency room.  The patient did not  do so at that time thinking that it  would get better.  She did have blood work done on the day prior to  admission as well as a chest x-ray and finally came into the emergency room  on the day of admission because she had not improved and was in fact,  getting worse.  She denied any fevers.  She reported chills which were  normal for her.  She also denied any sex contacts, any blood in her urine or  stool although she did report some bleeding of her gums and some blisters on  her tongue and lips.  The patient also reported weight loss since beginning  treatment.  Prior to beginning on the hepatitis treatments she weighed  approximately 148 pounds and on her last visit to Dr. Ardeen Garland office on  February 5th she weighed 119 pounds.  Most notably, she had a 10-pound  weight loss over the last month.  During this recent illness the patient  reports dizziness and imbalanced when on her feet as well as some vertigo.  She has had to walk by holding onto the wall recently secondary to these  symptoms.   On exam in the emergency room the patient was afebrile, her blood pressure  initially 85/56 which increased to 93/55 after 600  mL of IV fluids.  Her  heart rate was in the 80s with a normal respiratory rate  and an O2  saturation of 100% on room air.  In general she was an ill-appearing, middle  aged white female awake and alert and slender in habitus.  Her conjunctivae  were pale.  Her sclerae were anicteric.  Her lips were dry but her mucosa  moist.  No gross bleeding was seen in the oropharynx although what appeared  to be dried blood was seen on her teeth around the gum line.  Small blisters  were seen on her tongue and few petechia were seen on her palates.  No  lymphadenopathy was present.  Lungs were clear to auscultation with some  good effort in air flow.  Her heart sounds were distant with a normal S1 and  S2.  No murmurs and strong symmetric radial pulses.  Her nail beds did  appear pale.  Her abdomen was soft with normal bowel sounds.  Mild  suprapubic tenderness.  No guarding or rebound and no hepatosplenomegaly.  On rectal exam she was heme-negative.  Her neurological exam was nonfocal.  Skin exam showed no rashes but dry, cracked skin diffusely on left palmar  surface and several of her fingers.   ADMISSION LABORATORIES:  Actually from the day prior to admission and an  outside lab showed a white count decreased at 3.0 with 50% granulocytes, 31%  lymphs, and 11% eosinophils.  A hemoglobin of 8.1 with a hematocrit of 28.4.  The MCV was elevated at 111.8 and the RDW was elevated at 16.7.  Platelets  were normal at 216.  A BMP was normal with sodium 137, potassium 4.4,  chloride 108,  bicarb 20, BUN 8, creatinine 1.30, and glucose of 90.  LFTs  were normal except for a slightly elevated alk phos of 131.  Her AST was 17,  ALT less than 8, bilirubin 1.0, protein 7.0, albumin 3.8, and calcium  slightly decreased at 3.1.  A pre-albumin was 17.1 slightly decreased.  A  fasting lipid showed a total cholesterol of 102, triglycerides elevated at 190, HDL decreased at 25, and LDL good at 39.  Hepatitis C-RNA  and hepatitis  B-DNA quantitative levels are pending.  In the emergency room her urinalysis  was normal.  Blood and urine cultures are obtained and a H[place] showed a  hemoglobin of 7.1 with hematocrit of 21.7.   ADMISSION DIAGNOSTIC DATA:  Chest x-ray PA and lateral on February 6th  showed bronchial changes on unchanged from a previous May 18, 2002,  chest x-ray.   The patient was admitted for workup and alteration of her anemia as well as  her hypotension.   HOSPITAL COURSE:  Problem #1 - ANEMIA:  A CBC drawn upon admission showed  severe anemia with a hemoglobin of 5.8 and a hematocrit of 17.8 so the  patient was transfused with two units packed red blood cells.  Prior to the  transfusion studies were done on her blood to determine the etiology of her  anemia.  An LDH was normal at 175, haptoglobin was low at 8, B12 and folate  were normal at 528 and 16.4 respectively.  A retic count was found to be 2.0  which went uncorrected for her anemia produced a retic index of 0.3 which  was an inadequate response to her anemia.  Throughout her hospitalization  the patient continued to have heme-negative stools.  Following transfusion  the patient's hemoglobin increased to 8.7 and over the course of the next  two days remained stable so that at discharge her hemoglobin was 8.5 with a  hematocrit of 25.6.  Research into the effects of ribavirin and Interferon  found that both can produce a mixed anemia secondary to hemolysis as well as  bone marrow suppression.  No evidence of hemolysis was seen on her labs.  Evidence of bone marrow suppression was found and that her reticulocyte  count was also decreased.  This suppression was the most likely etiology for  her anemia and during hospitalization and at the time of discharge, the  patient's ribavirin and Interferon were held.  However, she states that  previous medications should be discontinued if hemoglobin becomes less than  8.5 during  treatment and that the anemia is usually reversible within weeks.  At the time of discharge, the patient's weakness had resolved as did her  pallor.  Her hemoglobin should be monitored closely over the coming weeks  and if she decides to restart this medication, it is possible that Epogen or  Procrit are options to help counteract the anemia caused by these  medications.  Her folate was elevated at 743.   Problem #2 - HYPOTENSION:  As mentioned above, on admission the patient's  systolic blood pressures were in the 80s-90s.  However, with IV fluids her  systolic blood pressures increased so that at the time of discharge they  were in the 120s.  Therefore, her hypotension was likely secndary to  dehydration.   Problem #3 - CHRONIC HEPATITIS B AND CHRONIC HEPATITIS C:  Per the patient's  old records she has type 1 hepatitis C which is the type that does not  respond as quickly to Interferon.  As mentioned above, the patient's Ribavirin and Interferon were discontinued during this hospitalization and  the patient decided not to restart these medications at the time of  discharge secondary to the adverse side effects including problem #1.  Her  quantitative viral loads need to be followed up on.  The patient is also  interested in finding another doctor to help with the management of her  hepatitis.  She possibly would be a good candidate  for the new hepatitis  clinic which opened in town.  As mentioned above, on admission her LFTs were  within normal limits.  The patient was placed on Celexa at admission _____  of her hepatitis medications secondary to the possible side effect of  depression.  The patient is no longer interested in continuing on this  medicine but did agree to remain on it until she is followed up in an  outpatient setting.  As she is on the lowest dose of Celexa, it is probably  unnecessary but I will look into this.  In addition, the small blisters and  irritation of her  lips is likely a side effect from the Interferon and  ribavirin.  The patient was using magic mouthwash which somewhat relieved  the pain during hospitalization.  She was discharged home with viscous  lidocaine to help with the pain.  She was instructed to contact me if these  blisters did not improve within the next week.   Problem #4 - HYPOCALCEMIA:  On admission the patient's calcium was low even  when corrected for albumin as it was 8.26.  On hospital day #2 her calcium  continued to decline.  A magnesium level was checked and found to be within  normal limits at 2.3, phosphate was a low normal at 2.5, albumin was low at  2.7, and the ionized calcium was very low at 0.98.  Prior to hospitalization  the patient was asymptomatic and therefore received oral supplementation  with Os-Cal + vitamin D.  The etiology of her hypocalcemia is at this time  unresolved.  A serum PTH and vitamin B levels are pending and need to be  followed up on.  The differential diagnosis included renal failure which is  unlikely as the patient had normal BUN and creatinine and no elevated  phosphates.  Rhabdomyolysis which is unlikely as the patient was without  symptoms and had no elevated phosphate.  Vitamin D deficiency which is a  possibility considering the patient's recent diarrhea and illness which may  have kept her from adequate intake.  In addition, hypoparathyroidism is a  possibility.  At discharge the patient was given a prescription for Os-Cal  +D which if taken as directed should provide 2 grams of elemental calcium a  day.  Her calcium level needs to be checked within the next couple of weeks  to see if it is correcting.  HIV is in the differential for hypocalcemia and  needs to be considered.  The patient states that she was tested  approximately in 1995 and was negative at that time.  She expressed no  interest in being tested for it at this time but this should be addressed in the future if  she remains hypocalcemic with an unknown etiology.   Problem #5 - NAUSEA:  During hospitalization the patient was without nausea  and did not require any medications for this.  She was able to tolerate p.o.  It is likely that this nausea was an exaggerated effect of the Interferon  and Ribavirin as it resolved on cessation of these medications.   Problem #6 - DYSLIPIDEMIA:  As mentioned above, the patient had a very low  HDL on her fasting lipid panel which is possibly secondary to her hepatitis  as she also has a very low LDL.  However, her triglyceride level was  elevated and this should be addressed as an outpatient with possible  medication use to control this.   Problem #7 -  THROMBOCYTOPENIA:  On admission the patient's platelets were  163 and as expected, decreased somewhat following transfusion to a nadir of  135.  At the time of discharge the platelets had increased back up to 141.  This should be followed in the outpatient setting to ensure improvement.   Problem #8 - LEUKOPENIA:  On admission the patient was found to have a  white count of approximately 3.  This decreased to a nadir of 2.7 but at the  time of discharge had increased somewhat to 3.4.  Etiology is possible bone  marrow suppression secondary to her hepatitis medications and therefore,  should improve following cessation of these medications.  This should be  followed as an outpatient.   Problem #9 - TOBACCO USE:  During hospitalization the patient refrained from  smoking and did not require a nicotine patch.  She received smoking  cessation counseling while in the hospital and cessation needs to continue  to be encouraged as an outpatient.  It is likely that the findings on the  chest x-rays in December of 2003 and June 25, 2002, are secondary to  smoking more other than any other sort of infectious process.           Georgina Peer, M.D.                 Alvester Morin, M.D.    JM/MEDQ  D:   06/29/2002  T:  06/30/2002  Job:  [id number]   cc:   Georgina Peer, M.D.  [address] [place], [place] [address]  Fax: [phone]

## 2010-11-06 ENCOUNTER — Encounter: Payer: Self-pay | Admitting: Cardiovascular Disease

## 2011-02-28 LAB — COMPREHENSIVE METABOLIC PANEL WITH GFR
ALT: 27
AST: 29
Alkaline Phosphatase: 125 — ABNORMAL HIGH
CO2: 23
Calcium: 9.2
Chloride: 111
GFR calc Af Amer: >60
GFR calc non Af Amer: >60
Glucose, Bld: 95
Potassium: 3.7
Sodium: 140

## 2011-02-28 LAB — CBC
HCT: 44.2
Hemoglobin: 14.9
MCHC: 33.8
MCV: 88.2
Platelets: 337
RBC: 5.02
RDW: 13.1
WBC: 12.8 — ABNORMAL HIGH

## 2011-02-28 LAB — URINALYSIS, ROUTINE W REFLEX MICROSCOPIC
Bilirubin Urine: NEGATIVE
Glucose, UA: NEGATIVE
Hgb urine dipstick: NEGATIVE
Ketones, ur: NEGATIVE
Nitrite: NEGATIVE
Protein, ur: NEGATIVE
Specific Gravity, Urine: 1.005
Urobilinogen, UA: 0.2
pH: 7

## 2011-02-28 LAB — DIFFERENTIAL
Basophils Absolute: 0.1
Basophils Relative: 0
Eosinophils Absolute: 0.1
Eosinophils Relative: 1
Lymphocytes Relative: 25
Lymphs Abs: 3.2
Monocytes Absolute: 0.7
Monocytes Relative: 6
Neutro Abs: 8.7 — ABNORMAL HIGH
Neutrophils Relative %: 68

## 2011-02-28 LAB — COMPREHENSIVE METABOLIC PANEL
Albumin: 3.6
BUN: 7
Creatinine, Ser: 0.68
Total Bilirubin: 0.9
Total Protein: 7.3

## 2011-02-28 LAB — POCT CARDIAC MARKERS
CKMB, poc: 1.7
Myoglobin, poc: 117
Operator id: 285841
Troponin i, poc: <0.05

## 2011-08-27 ENCOUNTER — Encounter: Payer: Self-pay | Admitting: Cardiology

## 2011-08-27 ENCOUNTER — Ambulatory Visit (INDEPENDENT_AMBULATORY_CARE_PROVIDER_SITE_OTHER): Payer: Self-pay | Admitting: Cardiology

## 2011-08-27 VITALS — BP 123/92 | HR 90 | Ht 65.0 in | Wt 163.0 lb

## 2011-08-27 DIAGNOSIS — F17200 Nicotine dependence, unspecified, uncomplicated: Secondary | ICD-10-CM

## 2011-08-27 DIAGNOSIS — I471 Supraventricular tachycardia, unspecified: Secondary | ICD-10-CM

## 2011-08-27 MED ORDER — ATENOLOL 50 MG PO TABS: 50.0000 mg | ORAL_TABLET | Freq: Two times a day (BID) | ORAL | Status: DC

## 2011-08-27 NOTE — Assessment & Plan Note (Signed)
Patient is having worsening palpitations off of her beta blocker. Resume atenolol 50 mg by mouth twice a day. We can consider referral for ablation if symptoms continue despite beta-blockade.

## 2011-08-27 NOTE — Patient Instructions (Signed)
The current medical regimen is effective;  continue present plan and medications.  Follow up in 6 months with Dr Crenshaw.  You will receive a letter in the mail 2 months before you are due.  Please call us when you receive this letter to schedule your follow up appointment.  

## 2011-08-27 NOTE — Assessment & Plan Note (Signed)
Patient counseled on discontinuing. 

## 2011-08-27 NOTE — Progress Notes (Signed)
HPI: [name] is a pleasant  female who I have seen in the past secondary to documented SVT.  She apparently had an episode in the ambulance and was documented to be as high as 200, but the strips are not available. Stress echocardiogram in October 2008 showed normal LV function and there was no ischemia. Previous monitor in October of 2008 showed sinus with occasional PACs and PVCs. Echocardiogram in February of 2011 showed normal LV function. I last saw her in Feb 2012. Since then, she does have dyspnea on exertion but no orthopnea, PND, pedal edema or chest pain. She ran out of her atenolol and since then has developed recurrent episodes of palpitations. They are sudden in onset and associated with dizziness. They resolve with taking atenolol. She was doing well with no palpitations prior to running out of her atenolol.  Current Outpatient Prescriptions  Medication Sig Dispense Refill  . ibuprofen (ADVIL,MOTRIN) 200 MG tablet Take 200 mg by mouth every 6 (six) hours as needed.        . Multiple Vitamin (MULTIVITAMIN) tablet Take 1 tablet by mouth daily.        Marland Kitchen ALPRAZolam (XANAX) 0.5 MG tablet Take 0.5 mg by mouth 3 (three) times daily.        Marland Kitchen atenolol (TENORMIN) 50 MG tablet Take 50 mg by mouth 2 (two) times daily.        . cholecalciferol (VITAMIN D) 1000 UNITS tablet Take 1,000 Units by mouth 3 (three) times daily.        Marland Kitchen etanercept (ENBREL) 50 MG/ML injection Inject 50 mg into the skin once a week.        . Ferrous Sulfate Dried (KP FERROUS SULFATE) 202 (65 FE) MG TABS Take by mouth as needed.        . methylcellulose (CITRUCEL) oral powder Take by mouth as needed.        . naproxen sodium (ANAPROX) 220 MG tablet Take 220 mg by mouth as needed.        Marland Kitchen omeprazole (PRILOSEC OTC) 20 MG tablet Take 20 mg by mouth daily.        Marland Kitchen oxyCODONE-acetaminophen (PERCOCET) 10-325 MG per tablet Take 1 tablet by mouth 4 (four) times daily.        . polyethylene glycol powder (MIRALAX) powder Take  17 g by mouth as needed.        . pregabalin (LYRICA) 100 MG capsule Take 100 mg by mouth 2 (two) times daily.        Marland Kitchen senna-docusate (SENOKOT-S) 8.6-50 MG per tablet Take 1 tablet by mouth as needed.           Past Medical History  Diagnosis Date  . 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), hypoglycemia 2/04-resolved with PO calcium + D, SVT, was treated with iterferon and ribavirin-10/3-2/04  . GERD (gastroesophageal reflux disease)   . PUD (peptic ulcer disease)   . Fibromyalgia   . Rheumatoid arthritis     possible  . SVT (supraventricular tachycardia)     Past Surgical History  Procedure Date  . Cesarean section 09/17/80  . Dilation and curettage of uterus   . Hysterectomy-for dysmenorrhea 09/17/00  . Nasal sinus surgery 03/20/84    for deviated septum  . Tonsillectomy 05/20/61    History   Social History  . Marital Status: Divorced    Spouse Name: N/A    Number of Children:  N/A  . Years of Education: N/A   Occupational History  . Not on file.   Social History Main Topics  . Smoking status: Current Everyday Smoker  . Smokeless tobacco: Not on file   Comment: 2 ppd since 56 yo   . Alcohol Use: No     denies; reports 3-4 beers/night to Dr. 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 Sexually Active: Not on file   Other Topics Concern  . Not on file   Social History Narrative   Divorced, lives with son [name] (born 6); parents nearby. 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 Hepatitis B & CHas 2 dogs and 5 catsDid inpatient substance abuse rehab in 1993    ROS: Arthralgias from rheumatoid arthritis but no fevers or chills, productive cough, hemoptysis, dysphasia, odynophagia, melena, hematochezia,  dysuria, hematuria, rash, seizure activity, orthopnea, PND, pedal edema, claudication. Remaining systems are negative.  Physical Exam: Well-developed well-nourished in no acute distress.  Skin is warm and dry.  HEENT is normal.  Neck is supple. No thyromegaly.  Chest is clear to auscultation with normal expansion.  Cardiovascular exam is regular rate and rhythm.  Abdominal exam nontender or distended. No masses palpated. Extremities show no edema. neuro grossly intact  ECG sinus rhythm at a rate of 90. Left axis deviation. No ST changes.

## 2012-05-19 ENCOUNTER — Other Ambulatory Visit: Payer: Self-pay | Admitting: Cardiology

## 2013-01-16 ENCOUNTER — Ambulatory Visit: Payer: Self-pay | Admitting: Family Medicine

## 2013-01-16 VITALS — BP 154/90 | HR 92 | Temp 99.1°F | Resp 18 | Ht 64.25 in | Wt 174.2 lb

## 2013-01-16 DIAGNOSIS — F418 Other specified anxiety disorders: Secondary | ICD-10-CM

## 2013-01-16 DIAGNOSIS — F430 Acute stress reaction: Secondary | ICD-10-CM

## 2013-01-16 DIAGNOSIS — F341 Dysthymic disorder: Secondary | ICD-10-CM

## 2013-01-16 MED ORDER — ALPRAZOLAM 0.5 MG PO TABS: 0.5000 mg | ORAL_TABLET | Freq: Two times a day (BID) | ORAL | Status: DC | PRN

## 2013-01-16 MED ORDER — SERTRALINE HCL 50 MG PO TABS: 50.0000 mg | ORAL_TABLET | Freq: Every day | ORAL | Status: DC

## 2013-01-16 NOTE — Progress Notes (Signed)
[address]   [place], [place]  [address]   [phone]  Subjective:    Patient ID: [name], female    DOB: [birth year], 57 y.o.   MRN: [id number]  HPI This 57 y.o. female presents for evaluation of depression/anxiety.  Father underwent lung cancer resection in July; one week later, suffered PTX.  Admitted x 21 days.  Developed hospital psychosis.  Father losing weight; at discharge, was talking pain.   Pt stopped narcotics due to delirium.  Got father in [facility] for rehab.  Father with dementia.   Dr. Patsy Lager previously prescribed Xanax which worked well.   Last Xanax two years ago.   Diagnosed with RA in 2010; diagnosed with fibromyalgia at that time as well. Tried Celexa in past; made lips feel funny.  Review of Systems  Constitutional: Negative for fever, chills, diaphoresis, activity change, appetite change and fatigue.  Musculoskeletal: Positive for arthralgias.  Neurological: Negative for dizziness, syncope, speech difficulty, weakness, light-headedness, numbness and headaches.  Psychiatric/Behavioral: Positive for sleep disturbance, dysphoric mood and decreased concentration. Negative for suicidal ideas, confusion and self-injury. The patient is nervous/anxious.    Past Medical History  Diagnosis Date  . 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), hypoglycemia 2/04-resolved with PO calcium + D, SVT, was treated with iterferon and ribavirin-10/3-2/04  . GERD (gastroesophageal reflux disease)   . PUD (peptic ulcer disease)   . Fibromyalgia   . Rheumatoid arthritis(714.0)     possible  . SVT (supraventricular tachycardia)   . Depression   . Osteoporosis    Allergies  Allergen Reactions  . Codeine    Current Outpatient Prescriptions on File Prior to Visit  Medication Sig Dispense Refill  . atenolol (TENORMIN) 50 MG tablet TAKE 1 TABLET BY MOUTH 2 TIMES A DAY  60 tablet  6  . Multiple  Vitamin (MULTIVITAMIN) tablet Take 1 tablet by mouth daily.         No current facility-administered medications on file prior to visit.   History   Social History  . Marital Status: Divorced    Spouse Name: N/A    Number of Children: N/A  . Years of Education: N/A   Occupational History  . Not on file.   Social History Main Topics  . Smoking status: Current Every Day Smoker  . Smokeless tobacco: Not on file     Comment: 2 ppd since 57 yo   . Alcohol Use: No     Comment: denies; reports 3-4 beers/night to Dr. 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 Sexual Activity: Not on file   Other Topics Concern  . Not on file   Social History Narrative   Divorced, lives with son [name] (born [birth year]); parents nearby. 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 Colon cancer Neg Hx   . Cancer Father 75    lung  . Dementia Mother       Objective:   Physical Exam  Nursing note  and vitals reviewed. Constitutional: She is oriented to person, place, and time. She appears well-developed and well-nourished. No distress.  HENT:  Head: Normocephalic and atraumatic.  Cardiovascular: Normal rate, regular rhythm and normal heart sounds.   Pulmonary/Chest: Effort normal and breath sounds normal.  Neurological: She is alert and oriented to person, place, and time. No cranial nerve deficit. She exhibits normal muscle tone. Coordination normal.  Skin: She is not diaphoretic.  Psychiatric: She has a normal mood and affect. Her behavior is normal. Judgment and thought content normal.  Tearful.       Assessment & Plan:  Depression with anxiety - Plan:  sertraline (ZOLOFT) 50 MG tablet, ALPRAZolam (XANAX) 0.5 MG tablet  Stress reaction   1.  Stress Reaction:  New.  Counseling provided during visit; father recently with declining health and delirium.   2.  Depression with anxiety: New.  Rx for Zoloft and Xanax provided; counseling provided in office; no SI.  Follow-up in two months.    Meds ordered this encounter  Medications  . sertraline (ZOLOFT) 50 MG tablet    Sig: Take 1 tablet (50 mg total) by mouth daily.    Dispense:  30 tablet    Refill:  5  . ALPRAZolam (XANAX) 0.5 MG tablet    Sig: Take 1 tablet (0.5 mg total) by mouth 2 (two) times daily as needed for sleep.    Dispense:  45 tablet    Refill:  0

## 2013-02-14 ENCOUNTER — Other Ambulatory Visit: Payer: Self-pay | Admitting: Family Medicine

## 2013-02-15 NOTE — Telephone Encounter (Signed)
Please call in refill of Xanax as approved. 

## 2013-02-16 ENCOUNTER — Other Ambulatory Visit: Payer: Self-pay | Admitting: Family Medicine

## 2013-02-16 ENCOUNTER — Telehealth: Payer: Self-pay

## 2013-02-16 NOTE — Telephone Encounter (Signed)
Prescription phoned in on 9/28 not received by pharmacy.  Gave verbal order for Alprazolam 0.5 mg, 1 PO BID PRN, #45, No Refills per Dr. Michaelle Copas original order.

## 2013-02-16 NOTE — Telephone Encounter (Signed)
I already called this to the pharmacy.

## 2013-02-16 NOTE — Telephone Encounter (Signed)
Pt states that [facility] [address] does not have rx for alprazelam that we called in for pt on 02/14/13 she would like to have someone call the rx in. Best# [phone]

## 2013-02-17 NOTE — Telephone Encounter (Signed)
Called in.

## 2013-03-04 ENCOUNTER — Other Ambulatory Visit: Payer: Self-pay | Admitting: Cardiology

## 2013-03-11 ENCOUNTER — Other Ambulatory Visit: Payer: Self-pay | Admitting: Family Medicine

## 2013-03-11 NOTE — Telephone Encounter (Signed)
Called in Rx and [facility] for pt that done and needs OV before next RF needed.

## 2013-03-11 NOTE — Telephone Encounter (Signed)
Please call in refill of Alprazolam to pharmacy; please also call patient and advise that she needs to RTC in upcoming month to follow-up on anxiety (Zoloft, Xanax).

## 2013-04-03 ENCOUNTER — Ambulatory Visit: Payer: Self-pay | Admitting: Family Medicine

## 2013-04-03 VITALS — BP 142/86 | HR 68 | Temp 97.6°F | Resp 18 | Ht 64.5 in | Wt 172.4 lb

## 2013-04-03 DIAGNOSIS — M797 Fibromyalgia: Secondary | ICD-10-CM

## 2013-04-03 DIAGNOSIS — F341 Dysthymic disorder: Secondary | ICD-10-CM

## 2013-04-03 DIAGNOSIS — F418 Other specified anxiety disorders: Secondary | ICD-10-CM

## 2013-04-03 DIAGNOSIS — IMO0001 Reserved for inherently not codable concepts without codable children: Secondary | ICD-10-CM

## 2013-04-03 DIAGNOSIS — Z8719 Personal history of other diseases of the digestive system: Secondary | ICD-10-CM

## 2013-04-03 DIAGNOSIS — M069 Rheumatoid arthritis, unspecified: Secondary | ICD-10-CM | POA: Insufficient documentation

## 2013-04-03 LAB — COMPREHENSIVE METABOLIC PANEL
ALT: 17 U/L (ref 0–35)
AST: 18 U/L (ref 0–37)
Albumin: 4.5 g/dL (ref 3.5–5.2)
Alkaline Phosphatase: 107 U/L (ref 39–117)
BUN: 15 mg/dL (ref 6–23)
CO2: 25 mEq/L (ref 19–32)
Calcium: 9.3 mg/dL (ref 8.4–10.5)
Chloride: 104 mEq/L (ref 96–112)
Creat: 0.76 mg/dL (ref 0.50–1.10)
Glucose, Bld: 84 mg/dL (ref 70–99)
Potassium: 4.5 mEq/L (ref 3.5–5.3)
Sodium: 139 mEq/L (ref 135–145)
Total Bilirubin: 0.4 mg/dL (ref 0.3–1.2)
Total Protein: 7.8 g/dL (ref 6.0–8.3)

## 2013-04-03 MED ORDER — ALPRAZOLAM 0.5 MG PO TABS: 0.5000 mg | ORAL_TABLET | Freq: Every day | ORAL | Status: DC | PRN

## 2013-04-03 MED ORDER — SERTRALINE HCL 50 MG PO TABS: 50.0000 mg | ORAL_TABLET | Freq: Every day | ORAL | Status: DC

## 2013-04-03 NOTE — Progress Notes (Signed)
  Subjective:    Patient ID: [name], female    DOB: 1956-03-16, 57 y.o.   MRN: [id number]  HPI    Review of Systems     Objective:   Physical Exam        Assessment & Plan:

## 2013-04-03 NOTE — Progress Notes (Signed)
Subjective:  This chart was scribed for [name] Sidle, MD by Carl Best, Medical Scribe. This patient was seen in Room 5 and the patient's care was started at 2:00 PM.  Patient ID: [name], female    DOB: 1955/06/28, 57 y.o.   MRN: [id number]  HPI HPI Comments: [name] is a 57 y.o. female with a history of rheumatoid arthritis and fibromyalgia who presents to the [facility] complaining of anxiety that started on December 02, 2012 after finding out that her father was having medical problems.  She states that her father was diagnosed with a tumor on his lung on December 02, 2012 and was hospitalized after his lung collapsed.  The patient states that her father was later diagnosed with dementia and is now living in the alzheimer's unit at [facility].  The patient states that her mother had dementia as well.  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  The patient denies being married.  She states that she is experiencing some financial difficulties.    She states that she has been advised to take antidepressants and Xanax but stopped taking them in 2012.  She states that she would like to refill her Xanax and Zoloft medication.  She states that she experienced some nausea when she first started taking Zoloft but it has since subsided.  The patient denies shortness of breath and constipation as an associated symptomsShe states that she takes 1.5 doses of Xanax a day.    She states that she was treated for Hepatitis C and was cured of her symptoms.  She states that she has not had a recheck of her symptoms since 2005.   Past Medical History  Diagnosis Date  . 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), hypoglycemia 2/04-resolved with PO calcium + D, SVT, was treated with iterferon and ribavirin-10/3-2/04  . GERD (gastroesophageal reflux disease)   . PUD  (peptic ulcer disease)   . Fibromyalgia   . Rheumatoid arthritis(714.0)     possible  . SVT (supraventricular tachycardia)   . Depression   . Osteoporosis    Past Surgical History  Procedure Laterality Date  . Cesarean section  09/17/80  . Dilation and curettage of uterus    . Hysterectomy-for dysmenorrhea  09/17/00  . Nasal sinus surgery  03/20/84    for deviated septum  . Tonsillectomy  05/20/61  . Abdominal hysterectomy     History   Social History  . Marital Status: Divorced    Spouse Name: N/A    Number of Children: N/A  . Years of Education: N/A   Occupational History  . Not on file.   Social History Main Topics  . Smoking status: Current Every Day Smoker  . Smokeless tobacco: Not on file     Comment: 2 ppd since 57 yo   . Alcohol Use: No     Comment: denies; reports 3-4 beers/night to Dr. 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 Sexual Activity: Not on file   Other Topics Concern  . Not on file   Social History Narrative   Divorced, lives with son [name] (born 68); parents nearby. 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 of technical college    Hepatitis B & C   Has 2 dogs and 5 cats   Did inpatient substance abuse rehab in 1993         Filed Vitals:   04/03/13 1350  BP: 142/86  Pulse: 68  Temp: 97.6 F (36.4 C)  TempSrc: Oral  Resp: 18  Height: 5' 4.5" (1.638 m)  Weight: 172 lb 6.4 oz (78.2 kg)  SpO2: 97%   Overall patient is doing much better Review of Systems History of hepatitis B and hepatitis C  Objective:  Physical Exam HEENT: Unremarkable Chest: Clear Heart: Regular no murmur Abdomen: Soft nontender without HSM or mass    Assessment & Plan:   Overall patient is doing much better Depression with anxiety - Plan: sertraline (ZOLOFT) 50 MG tablet, Comprehensive  metabolic panel, ALPRAZolam (XANAX) 0.5 MG tablet, DISCONTINUED: ALPRAZolam (XANAX) 0.5 MG tablet  Fibromyalgia - Plan: Comprehensive metabolic panel  Rheumatoid arthritis - Plan: Comprehensive metabolic panel  H/O chronic hepatitis - Plan: Comprehensive metabolic panel Check CMET Follow up 6 months.  Signed, [name] Sidle, MD

## 2013-04-16 ENCOUNTER — Other Ambulatory Visit: Payer: Self-pay | Admitting: Cardiology

## 2013-05-28 ENCOUNTER — Other Ambulatory Visit: Payer: Self-pay | Admitting: Cardiology

## 2013-06-01 ENCOUNTER — Other Ambulatory Visit: Payer: Self-pay | Admitting: *Deleted

## 2013-06-01 MED ORDER — ATENOLOL 50 MG PO TABS: ORAL_TABLET | ORAL | Status: DC

## 2013-07-05 ENCOUNTER — Encounter: Payer: Self-pay | Admitting: Cardiology

## 2013-07-05 ENCOUNTER — Ambulatory Visit (INDEPENDENT_AMBULATORY_CARE_PROVIDER_SITE_OTHER): Payer: Self-pay | Admitting: Cardiology

## 2013-07-05 VITALS — BP 130/88 | HR 70 | Ht 64.0 in | Wt 166.0 lb

## 2013-07-05 DIAGNOSIS — I471 Supraventricular tachycardia, unspecified: Secondary | ICD-10-CM

## 2013-07-05 MED ORDER — ATENOLOL 50 MG PO TABS
ORAL_TABLET | ORAL | Status: DC
Start: 2013-07-05 — End: 2014-02-26

## 2013-07-05 NOTE — Assessment & Plan Note (Signed)
Patient counseled on discontinuing. 

## 2013-07-05 NOTE — Assessment & Plan Note (Signed)
No recent episodes.continue beta blocker. We can refer for ablation if she has frequent episodes in the future.

## 2013-07-05 NOTE — Patient Instructions (Signed)
Your physician wants you to follow-up in: ONE YEAR WITH DR CRENSHAW You will receive a reminder letter in the mail two months in advance. If you don't receive a letter, please call our office to schedule the follow-up appointment.  

## 2013-07-05 NOTE — Progress Notes (Signed)
HPI: FU SVT. She apparently had an episode in the ambulance and was documented to be as high as 200, but the strips are not available. Stress echocardiogram in October 2008 showed normal LV function and there was no ischemia. Previous monitor in October of 2008 showed sinus with occasional PACs and PVCs. Echocardiogram in February of 2011 showed normal LV function. I last saw her in April 2013. Since then, she denies dyspnea, chest pain or syncope. Occasional brief flutters but no sustained palpitations.   Current Outpatient Prescriptions  Medication Sig Dispense Refill  . ALPRAZolam (XANAX) 0.5 MG tablet Take 1 tablet (0.5 mg total) by mouth daily as needed for anxiety.  60 tablet  4  . atenolol (TENORMIN) 50 MG tablet TAKE 1 TABLET BY MOUTH 2 TIMES A DAY  60 tablet  0  . Multiple Vitamin (MULTIVITAMIN) tablet Take 1 tablet by mouth daily.        Marland Kitchen omeprazole (PRILOSEC) 20 MG capsule Take 20 mg by mouth daily.      . sertraline (ZOLOFT) 50 MG tablet Take 1 tablet (50 mg total) by mouth daily.  30 tablet  5   No current facility-administered medications for this visit.     Past Medical History  Diagnosis Date  . 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), hypoglycemia 2/04-resolved with PO calcium + D, SVT, was treated with iterferon and ribavirin-10/3-2/04  . GERD (gastroesophageal reflux disease)   . PUD (peptic ulcer disease)   . Fibromyalgia   . Rheumatoid arthritis(714.0)     possible  . SVT (supraventricular tachycardia)   . Depression   . Osteoporosis     Past Surgical History  Procedure Laterality Date  . Cesarean section  09/17/80  . Dilation and curettage of uterus    . Hysterectomy-for dysmenorrhea  09/17/00  . Nasal sinus surgery  03/20/84    for deviated septum  . Tonsillectomy  05/20/61  . Abdominal hysterectomy      History   Social History  . Marital Status: Divorced    Spouse  Name: N/A    Number of Children: N/A  . Years of Education: N/A   Occupational History  . Not on file.   Social History Main Topics  . Smoking status: Current Every Day Smoker -- 1.50 packs/day    Types: Cigarettes  . Smokeless tobacco: Not on file     Comment: 2 ppd since 58 yo   . Alcohol Use: No     Comment: denies; reports 3-4 beers/night to Dr. 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 Sexual Activity: Not on file   Other Topics Concern  . Not on file   Social History Narrative   Divorced, lives with son Grayland Ormond (born 68); parents nearby. 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 Remaining systems are negative.  Physical Exam: Well-developed well-nourished in no  acute distress.  Skin is warm and dry.  HEENT is normal.  Neck is supple.  Chest is clear to auscultation with normal expansion.  Cardiovascular exam is regular rate and rhythm.  Abdominal exam nontender or distended. No masses palpated. Extremities show no edema. neuro grossly intact  ECG sinus rhythm at a rate of 70. No ST changes.

## 2013-07-26 ENCOUNTER — Other Ambulatory Visit: Payer: Self-pay | Admitting: Family Medicine

## 2013-09-14 ENCOUNTER — Other Ambulatory Visit: Payer: Self-pay | Admitting: Family Medicine

## 2013-09-15 NOTE — Telephone Encounter (Signed)
Called in.

## 2013-12-01 ENCOUNTER — Other Ambulatory Visit (HOSPITAL_COMMUNITY): Payer: Self-pay | Admitting: *Deleted

## 2013-12-01 DIAGNOSIS — N6320 Unspecified lump in the left breast, unspecified quadrant: Secondary | ICD-10-CM

## 2013-12-14 ENCOUNTER — Telehealth: Payer: Self-pay | Admitting: Cardiology

## 2013-12-14 ENCOUNTER — Encounter (HOSPITAL_COMMUNITY): Payer: Self-pay

## 2013-12-14 ENCOUNTER — Ambulatory Visit (HOSPITAL_COMMUNITY)
Admission: RE | Admit: 2013-12-14 | Discharge: 2013-12-14 | Disposition: A | Discharge status: Home / Self Care | Payer: Medicaid Other | Source: Ambulatory Visit | Attending: Obstetrics and Gynecology | Admitting: Obstetrics and Gynecology

## 2013-12-14 VITALS — BP 146/102 | Temp 98.2°F | Ht 64.0 in | Wt 171.8 lb

## 2013-12-14 DIAGNOSIS — R5383 Other fatigue: Secondary | ICD-10-CM | POA: Diagnosis not present

## 2013-12-14 DIAGNOSIS — R42 Dizziness and giddiness: Secondary | ICD-10-CM | POA: Diagnosis not present

## 2013-12-14 DIAGNOSIS — I4902 Ventricular flutter: Secondary | ICD-10-CM | POA: Insufficient documentation

## 2013-12-14 DIAGNOSIS — N63 Unspecified lump in unspecified breast: Secondary | ICD-10-CM | POA: Diagnosis not present

## 2013-12-14 DIAGNOSIS — Z90710 Acquired absence of both cervix and uterus: Secondary | ICD-10-CM | POA: Insufficient documentation

## 2013-12-14 DIAGNOSIS — R5381 Other malaise: Secondary | ICD-10-CM | POA: Diagnosis not present

## 2013-12-14 DIAGNOSIS — Z01419 Encounter for gynecological examination (general) (routine) without abnormal findings: Secondary | ICD-10-CM

## 2013-12-14 DIAGNOSIS — Z124 Encounter for screening for malignant neoplasm of cervix: Secondary | ICD-10-CM | POA: Diagnosis not present

## 2013-12-14 DIAGNOSIS — N6489 Other specified disorders of breast: Secondary | ICD-10-CM | POA: Diagnosis not present

## 2013-12-14 DIAGNOSIS — R030 Elevated blood-pressure reading, without diagnosis of hypertension: Secondary | ICD-10-CM | POA: Diagnosis not present

## 2013-12-14 HISTORY — DX: Unspecified viral hepatitis C without hepatic coma: B19.20

## 2013-12-14 NOTE — Addendum Note (Signed)
Encounter addended by: Rondell Reams, LPN on: 10/24/3014  0:10 PM<BR>     Documentation filed: Clinical Notes

## 2013-12-14 NOTE — Addendum Note (Signed)
Encounter addended by: Shirley Muscat, RN on: 12/14/2013 12:17 PM<BR>     Documentation filed: Visit Diagnoses, Notes Section

## 2013-12-14 NOTE — Addendum Note (Signed)
Encounter addended by: Rondell Reams, LPN on: 3/53/6144  3:15 PM<BR>     Documentation filed: Visit Diagnoses, Orders

## 2013-12-14 NOTE — Telephone Encounter (Signed)
I talked to this pt and she stated her job was really stressing her out and was causing her blood pressure to be elevated. I told the the pt to see her PCP and if her PCP thought Dr Stanford Breed needs to see then make an appointment

## 2013-12-14 NOTE — Progress Notes (Signed)
Patient ID: [name], female   DOB: [birth year], 58 y.o.   MRN: [id number] Patient seen in [facility] clinic today. Complained of fatigue, heart flutters, and dizziness. Blood pressure was elevated today. Advised patient to contact cardiologist or go to ED for evaluation.

## 2013-12-14 NOTE — Progress Notes (Addendum)
Complaints of left lower breast being bruised that started in March. Patient stated it has gotten smaller since she first noticed it. Patient stated there is a lump that is painful and itchy within the left breast. Patient stated she has noticed some left breast redness and that the nipple is larger than the right.   Pap Smear:  Pap smear completed today. Patients last Pap smear was 10/14/2002 and normal. Per patient has no history of an abnormal Pap smear. Patient has a history of Genital Warts in 1998 or 1999. Patient has a history of a hysterectomy 09/30/2000 for AUB that only her uterus was removed. Patient still has her cervix and bilateral ovaries. Last Pap smear result and hysterectomy report is in EPIC.   Physical exam: Breasts Breasts symmetrical. No skin abnormalities right breast. Small bruised area left lower breast, small reddened patch of skin left outer breast, and reddened area under breast that patient stated itches that is consistent with yeast. Suggested to patient to try OTC Lotrimin cream and keep area dry. No nipple retraction bilateral breasts. No nipple discharge bilateral breasts. No lymphadenopathy. No lumps palpated bilateral breasts. Patient complained of left outer breast and right center breast tenderness on exam. Referred patient to the [facility] for bilateral diagnostic mammogram. Appointment scheduled for Thursday, December 16, 2013 at [place].         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 Offered to refer patient to the [facility] to have the HPV lesion treated. Patient refused treatment at this time.        Vagina Vagina pink and normal texture. No lesions or discharge observed in vagina.          Cervix Cervix is present. Cervix pink and of normal texture. No discharge observed.     Uterus Uterus is absent due to  history of hysterectomy.   Adnexae Bilateral ovaries present and palpable. No tenderness on palpation.          Rectovaginal No rectal exam completed today since patient had no rectal complaints. No skin abnormalities observed on exam.       Smoking cessation discussed with patient. Patient referred to the [facility] Quit line and given information about smoking cessation classes offered at the [facility].

## 2013-12-14 NOTE — Patient Instructions (Signed)
Explained to [name] that [name] will cover Pap smears and co-testing every 5 years unless has a history of abnormal Pap smears. Referred patient to the [name] for bilateral diagnostic mammogram. Appointment scheduled for Thursday, December 16, 2013 at [place]. Patient aware of appointment and will be there. Let patient know will follow up with her within the next couple weeks with results of Pap smear by phone. [name] Verbalized understanding.

## 2013-12-14 NOTE — Telephone Encounter (Signed)
New Message  Pt called states that she is feeling very bad. Within the last 3 weeks she has progressively experienced SOB Weakness and Nausea. She says as of today her BP is 146/102 sitting down. She reports that she had an appt with the breast center who informed her to contact her cardiologist. Please call back to discuss.

## 2013-12-16 ENCOUNTER — Ambulatory Visit
Admission: RE | Admit: 2013-12-16 | Discharge: 2013-12-16 | Disposition: A | Discharge status: Home / Self Care | Payer: No Typology Code available for payment source | Source: Ambulatory Visit | Attending: Obstetrics and Gynecology | Admitting: Obstetrics and Gynecology

## 2013-12-16 ENCOUNTER — Other Ambulatory Visit (HOSPITAL_COMMUNITY): Payer: Self-pay | Admitting: Obstetrics and Gynecology

## 2013-12-16 DIAGNOSIS — N6320 Unspecified lump in the left breast, unspecified quadrant: Secondary | ICD-10-CM

## 2013-12-16 DIAGNOSIS — N6310 Unspecified lump in the right breast, unspecified quadrant: Secondary | ICD-10-CM

## 2013-12-20 ENCOUNTER — Other Ambulatory Visit (INDEPENDENT_AMBULATORY_CARE_PROVIDER_SITE_OTHER): Payer: Self-pay | Admitting: General Surgery

## 2013-12-20 ENCOUNTER — Telehealth (HOSPITAL_COMMUNITY): Payer: Self-pay | Admitting: *Deleted

## 2013-12-20 ENCOUNTER — Telehealth: Payer: Self-pay | Admitting: *Deleted

## 2013-12-20 DIAGNOSIS — C50911 Malignant neoplasm of unspecified site of right female breast: Secondary | ICD-10-CM

## 2013-12-20 DIAGNOSIS — C50411 Malignant neoplasm of upper-outer quadrant of right female breast: Secondary | ICD-10-CM | POA: Insufficient documentation

## 2013-12-20 DIAGNOSIS — D0511 Intraductal carcinoma in situ of right breast: Secondary | ICD-10-CM

## 2013-12-20 LAB — CYTOLOGY - PAP

## 2013-12-20 NOTE — Telephone Encounter (Signed)
Received call back from patient.  Confirmed [facility] for 12/29/13 at 8am .  Instructions and contact information given.

## 2013-12-20 NOTE — Telephone Encounter (Signed)
Left message for a return phone call to schedule patient for [facility] 12/29/13.  Awaiting patient response.

## 2013-12-20 NOTE — Telephone Encounter (Signed)
Telephoned patient at home # and advised of normal pap smear results. Next pap smear due in 5 years. Patient will meet with me next week 8/12 to fill out [facility] paperwork. Patient voiced understanding.

## 2013-12-23 ENCOUNTER — Ambulatory Visit
Admission: RE | Admit: 2013-12-23 | Discharge: 2013-12-23 | Disposition: A | Discharge status: Home / Self Care | Payer: No Typology Code available for payment source | Source: Ambulatory Visit | Attending: General Surgery | Admitting: General Surgery

## 2013-12-23 DIAGNOSIS — C50911 Malignant neoplasm of unspecified site of right female breast: Secondary | ICD-10-CM

## 2013-12-23 DIAGNOSIS — D0511 Intraductal carcinoma in situ of right breast: Secondary | ICD-10-CM

## 2013-12-23 MED ORDER — GADOBENATE DIMEGLUMINE 529 MG/ML IV SOLN
16.0000 mL | Freq: Once | INTRAVENOUS | Status: AC | PRN
  Administered 2013-12-23: 16 mL via INTRAVENOUS

## 2013-12-28 ENCOUNTER — Telehealth: Payer: Self-pay

## 2013-12-28 NOTE — Telephone Encounter (Signed)
PT STATES SHE WAS DIAGNOSED AT [facility] WITH CANCER AND GIVEN XANAX 0.5 MGS, WOULD LIKE TO HAVE A REFILL AND TAKE 3 A DAY, IT MAY BE TO EARLY TO GET REFILLS BUT SHE TOOK MORE THAN USUAL WITH SO MUCH STUFF GOING ON. PLEASE CALL PT AT [phone] OR [phone]

## 2013-12-28 NOTE — Telephone Encounter (Signed)
Patient needs to be seen to increase the xanax

## 2013-12-28 NOTE — Telephone Encounter (Signed)
She is having a great deal of anxiety and would like to have her xanax increased to TID Please advise.

## 2013-12-29 ENCOUNTER — Telehealth: Payer: Self-pay | Admitting: *Deleted

## 2013-12-29 ENCOUNTER — Encounter: Payer: Self-pay | Admitting: Dietician

## 2013-12-29 ENCOUNTER — Telehealth: Payer: Self-pay | Admitting: Hematology and Oncology

## 2013-12-29 ENCOUNTER — Ambulatory Visit (HOSPITAL_BASED_OUTPATIENT_CLINIC_OR_DEPARTMENT_OTHER): Payer: Medicaid Other | Admitting: General Surgery

## 2013-12-29 ENCOUNTER — Ambulatory Visit
Admission: RE | Admit: 2013-12-29 | Discharge: 2013-12-29 | Disposition: A | Discharge status: Home / Self Care | Payer: Medicaid Other | Source: Ambulatory Visit | Attending: Radiation Oncology | Admitting: Radiation Oncology

## 2013-12-29 ENCOUNTER — Encounter: Payer: Self-pay | Admitting: *Deleted

## 2013-12-29 ENCOUNTER — Encounter: Payer: Self-pay | Admitting: Hematology and Oncology

## 2013-12-29 ENCOUNTER — Other Ambulatory Visit (INDEPENDENT_AMBULATORY_CARE_PROVIDER_SITE_OTHER): Payer: Self-pay | Admitting: General Surgery

## 2013-12-29 ENCOUNTER — Other Ambulatory Visit (HOSPITAL_BASED_OUTPATIENT_CLINIC_OR_DEPARTMENT_OTHER): Payer: Medicaid Other

## 2013-12-29 ENCOUNTER — Ambulatory Visit: Payer: No Typology Code available for payment source

## 2013-12-29 ENCOUNTER — Ambulatory Visit (HOSPITAL_BASED_OUTPATIENT_CLINIC_OR_DEPARTMENT_OTHER): Payer: Medicaid Other | Admitting: Hematology and Oncology

## 2013-12-29 ENCOUNTER — Ambulatory Visit: Payer: No Typology Code available for payment source | Admitting: Physical Therapy

## 2013-12-29 VITALS — BP 162/88 | HR 65 | Temp 98.0°F | Resp 20 | Ht 64.0 in | Wt 174.6 lb

## 2013-12-29 DIAGNOSIS — C50411 Malignant neoplasm of upper-outer quadrant of right female breast: Secondary | ICD-10-CM

## 2013-12-29 DIAGNOSIS — C50419 Malignant neoplasm of upper-outer quadrant of unspecified female breast: Secondary | ICD-10-CM

## 2013-12-29 DIAGNOSIS — Z170 Estrogen receptor positive status [ER+]: Secondary | ICD-10-CM

## 2013-12-29 DIAGNOSIS — R928 Other abnormal and inconclusive findings on diagnostic imaging of breast: Secondary | ICD-10-CM

## 2013-12-29 DIAGNOSIS — M810 Age-related osteoporosis without current pathological fracture: Secondary | ICD-10-CM

## 2013-12-29 LAB — CBC WITH DIFFERENTIAL/PLATELET
BASO%: 0.6 % (ref 0.0–2.0)
Basophils Absolute: 0.1 10*3/uL (ref 0.0–0.1)
EOS%: 2.7 % (ref 0.0–7.0)
Eosinophils Absolute: 0.3 10*3/uL (ref 0.0–0.5)
HEMATOCRIT: 45 % (ref 34.8–46.6)
HGB: 14.8 g/dL (ref 11.6–15.9)
LYMPH#: 3 10*3/uL (ref 0.9–3.3)
LYMPH%: 31.2 % (ref 14.0–49.7)
MCH: 29.5 pg (ref 25.1–34.0)
MCHC: 32.9 g/dL (ref 31.5–36.0)
MCV: 89.8 fL (ref 79.5–101.0)
MONO#: 0.6 10*3/uL (ref 0.1–0.9)
MONO%: 6.2 % (ref 0.0–14.0)
NEUT#: 5.7 10*3/uL (ref 1.5–6.5)
NEUT%: 59.3 % (ref 38.4–76.8)
Platelets: 243 10*3/uL (ref 145–400)
RBC: 5.01 10*6/uL (ref 3.70–5.45)
RDW: 12.9 % (ref 11.2–14.5)
WBC: 9.6 10*3/uL (ref 3.9–10.3)

## 2013-12-29 LAB — COMPREHENSIVE METABOLIC PANEL (CC13)
ALT: 20 U/L (ref 0–55)
AST: 24 U/L (ref 5–34)
Albumin: 3.6 g/dL (ref 3.5–5.0)
Alkaline Phosphatase: 99 U/L (ref 40–150)
Anion Gap: 11 mEq/L (ref 3–11)
BILIRUBIN TOTAL: 0.46 mg/dL (ref 0.20–1.20)
BUN: 11 mg/dL (ref 7.0–26.0)
CALCIUM: 8.7 mg/dL (ref 8.4–10.4)
CHLORIDE: 107 meq/L (ref 98–109)
CO2: 21 mEq/L — ABNORMAL LOW (ref 22–29)
Creatinine: 0.7 mg/dL (ref 0.6–1.1)
Glucose: 91 mg/dl (ref 70–140)
Potassium: 3.9 mEq/L (ref 3.5–5.1)
Sodium: 139 mEq/L (ref 136–145)
Total Protein: 7.2 g/dL (ref 6.4–8.3)

## 2013-12-29 NOTE — Telephone Encounter (Signed)
Lm for pt to RTC 

## 2013-12-29 NOTE — Progress Notes (Signed)
Chief complaint:  Right breast cancer  Referring MD: Edilia Bo, MD  HISTORY:  Pt is a 58 yo F who presents with a new diagnosis of right breast cancer.  [name] is here for consultation at the request of Dr. Edilia Bo.  [name] presented with new bruising and soreness of the left breast.  [name] was found to have a palpable mass and asymmetry on the right on imaging.  The left breast was negative for any findings.  [name] did not palpate the mass prior to this.  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  Ki67 is 24%.  MRI demonstrated this finding with a satellite nodule inferior 1.5 cm to the main mass and calcifications extending to nipple.  [name] has a history of possible RA and fibromyalgia.  [name] was on many chronic pain meds, but [name] discontinued them due to the side effects of sedation and loss of concentration.   [name] has a family history of father with lung cancer, paternal cousin with breast cancer, paternal uncle with lung cancer, 2 maternal aunts with breast cancer, and a maternal aunt with stomach cancer.  [name] had menarche at age 55, and had stopped having periods in 2002.  [name] had one child at age 64.  [name] did use hormonal contraception for around 10 years.  [name] is up to date on her colonoscopy, bone density, and pap smear.    Past Medical History  Diagnosis Date  . 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), hypoglycemia 2/04-resolved with PO calcium + D, SVT, was treated with iterferon and ribavirin-10/3-2/04  . GERD (gastroesophageal reflux disease)   . PUD (peptic ulcer disease)   . Fibromyalgia   . Rheumatoid arthritis(714.0)     possible  . SVT (supraventricular tachycardia)   . Depression   . Osteoporosis   . Hepatitis C     Pt was given too much interferon - critical care x2d, 5 days total hosp  . Anxiety     Past Surgical History  Procedure Laterality  Date  . Cesarean section  09/17/80  . Dilation and curettage of uterus    . Hysterectomy-for dysmenorrhea  09/17/00  . Nasal sinus surgery  03/20/84    for deviated septum  . Tonsillectomy  05/20/61  . Abdominal hysterectomy      Current Outpatient Prescriptions  Medication Sig Dispense Refill  . ALPRAZolam (XANAX) 0.5 MG tablet TAKE 1 TABLET BY MOUTH TWICE A DAY AS NEEDED  60 tablet  5  . atenolol (TENORMIN) 50 MG tablet TAKE 1 TABLET BY MOUTH 2 TIMES A DAY  60 tablet  12  . Multiple Vitamin (MULTIVITAMIN) tablet Take 1 tablet by mouth daily.        . naproxen sodium (ANAPROX) 220 MG tablet Take 220 mg by mouth 2 (two) times daily with a meal.      . omeprazole (PRILOSEC) 20 MG capsule Take 20 mg by mouth daily.      . sertraline (ZOLOFT) 50 MG tablet Take 1 tablet (50 mg total) by mouth daily.  30 tablet  5   No current facility-administered medications for this visit.     Allergies  Allergen Reactions  . Codeine Itching and Nausea Only    Pt has had codeine with pre-administration to prevent reaction without any problem.      Family History  Problem Relation Age of Onset  .  Colon cancer Neg Hx   . Cancer Father 89    lung  . Dementia Father   . Dementia Mother   . Hypertension Mother   . Hyperlipidemia Mother   . Breast cancer Maternal Aunt   . Cancer Paternal Uncle   . Breast cancer Maternal Aunt   . Breast cancer Maternal Aunt      History   Social History  . Marital Status: Divorced    Spouse Name: N/A    Number of Children: N/A  . Years of Education: N/A   Social History Main Topics  . Smoking status: Current Every Day Smoker -- 2.00 packs/day for 45 years    Types: Cigarettes  . Smokeless tobacco: Never Used     Comment: 2 ppd since 58 yo   . Alcohol Use: Yes     Comment: denies; reports 3-4 beers/night to Dr. 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 Sexual Activity: Not Currently    Birth Control/ Protection: Surgical   Other Topics Concern  . Not on file   Social History Narrative   Divorced, lives with son [name] (born [birth year]); parents nearby. 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    Hepatitis B & C   Has 2 dogs and 5 cats   Did inpatient substance abuse rehab in [place]: 12 point review of systems negative other than HPI and PMH except for fatigue, palpitations, shortness of breath, heartburn, ulcer, joint pain, back pain, headaches, anxiety, depression, h/o transfusions, sinus problems.    EXAM: Wt Readings from Last 3 Encounters:  12/29/13 174 lb 9.6 oz (79.198 kg)  12/14/13 171 lb 12.8 oz (77.928 kg)  07/05/13 166 lb (75.297 kg)   Temp Readings from Last 3 Encounters:  12/29/13 98 F (36.7 C) Oral  12/14/13 98.2 F (36.8 C) Oral  04/03/13 97.6 F (36.4 C) Oral   BP Readings from Last 3 Encounters:  12/29/13 162/88  12/14/13 146/102  07/05/13 130/88   Pulse Readings from Last 3 Encounters:  12/29/13 65  07/05/13 70  04/03/13 68     Wt Readings from Last 3 Encounters:  12/29/13 174 lb 9.6 oz (79.198 kg)  12/14/13 171 lb 12.8 oz (77.928 kg)  07/05/13 166 lb (75.297 kg)     Gen:  No acute distress.  Well nourished and well groomed.   Neurological: Alert and oriented to person, place, and time. Coordination normal.  Head: Normocephalic and atraumatic.  Eyes: Conjunctivae are normal. Pupils are equal, round, and reactive to light. No scleral icterus.  Neck: Normal range of motion. Neck supple. No tracheal deviation or thyromegaly present.  Cardiovascular: Normal rate, regular rhythm, normal heart sounds and intact distal pulses.  Exam reveals no gallop and no friction rub.  No murmur heard. Breast: palpable mass on right, no palpable mass on the left.  No  palpable lymphadenopathy.  No skin dimpling or nipple retraction, no nipple discharge.   Respiratory: Effort normal.  No respiratory distress. No chest wall tenderness. Breath sounds normal.  No wheezes, rales or rhonchi.  GI: Soft. Bowel sounds are normal. The abdomen is soft and nontender.  There is no rebound and no  guarding.  Musculoskeletal: Normal range of motion. Extremities are nontender.  Lymphadenopathy: No cervical, preauricular, postauricular or axillary adenopathy is present Skin: Skin is warm and dry. No rash noted. No diaphoresis. No erythema. No pallor. No clubbing, cyanosis, or edema.   Psychiatric: Normal mood and affect. Behavior is normal. Judgment and thought content normal.    LABORATORY RESULTS: Available labs are reviewed   Recent Results (from the past 2160 hour(s))  CYTOLOGY - PAP     Status: None   Collection Time    12/14/13 12:00 AM      Result Value Ref Range   CYTOLOGY - PAP PAP RESULT    CBC WITH DIFFERENTIAL     Status: None   Collection Time    12/29/13  7:59 AM      Result Value Ref Range   WBC 9.6  3.9 - 10.3 10e3/uL   NEUT# 5.7  1.5 - 6.5 10e3/uL   HGB 14.8  11.6 - 15.9 g/dL   HCT 45.0  34.8 - 46.6 %   Platelets 243  145 - 400 10e3/uL   MCV 89.8  79.5 - 101.0 fL   MCH 29.5  25.1 - 34.0 pg   MCHC 32.9  31.5 - 36.0 g/dL   RBC 5.01  3.70 - 5.45 10e6/uL   RDW 12.9  11.2 - 14.5 %   lymph# 3.0  0.9 - 3.3 10e3/uL   MONO# 0.6  0.1 - 0.9 10e3/uL   Eosinophils Absolute 0.3  0.0 - 0.5 10e3/uL   Basophils Absolute 0.1  0.0 - 0.1 10e3/uL   NEUT% 59.3  38.4 - 76.8 %   LYMPH% 31.2  14.0 - 49.7 %   MONO% 6.2  0.0 - 14.0 %   EOS% 2.7  0.0 - 7.0 %   BASO% 0.6  0.0 - 2.0 %  COMPREHENSIVE METABOLIC PANEL (SV77)     Status: Abnormal   Collection Time    12/29/13  7:59 AM      Result Value Ref Range   Sodium 139  136 - 145 mEq/L   Potassium 3.9  3.5 - 5.1 mEq/L   Chloride 107  98 - 109 mEq/L   CO2 21 (*) 22 - 29 mEq/L   Glucose 91  70 - 140 mg/dl   BUN  11.0  7.0 - 26.0 mg/dL   Creatinine 0.7  0.6 - 1.1 mg/dL   Total Bilirubin 0.46  0.20 - 1.20 mg/dL   Alkaline Phosphatase 99  40 - 150 U/L   AST 24  5 - 34 U/L   ALT 20  0 - 55 U/L   Total Protein 7.2  6.4 - 8.3 g/dL   Albumin 3.6  3.5 - 5.0 g/dL   Calcium 8.7  8.4 - 10.4 mg/dL   Anion Gap 11  3 - 11 mEq/L     RADIOLOGY RESULTS: See E-Chart or I-Site for most recent results.  Images and reports are reviewed.  Mr Breast Bilateral W Wo Contrast  12/23/2013   CLINICAL DATA:  Patient is a 58 year old female with recent diagnosis of right breast IDC and DCIS on image guided biopsy.  LABS:  Creatinine 0.5, BUN 13, GFR 127  EXAM: BILATERAL BREAST MRI WITH AND WITHOUT CONTRAST  TECHNIQUE: Multiplanar, multisequence MR images of both breasts were obtained prior to and following the intravenous administration of 61m of MultiHance.  THREE-DIMENSIONAL MR IMAGE RENDERING ON INDEPENDENT WORKSTATION:  Three-dimensional MR images were rendered by post-processing of the original MR data on  an independent workstation. The three-dimensional MR images were interpreted, and findings are reported in the following complete MRI report for this study. Three dimensional images were evaluated at the independent DynaCad workstation  COMPARISON:  12/16/2013 mammography  FINDINGS: Breast composition: Heterogeneous fibroglandular tissue.  Background parenchymal enhancement: The multiplanar, multi sequence bilateral breast MR demonstrates moderate background parenchymal enhancement with scattered foci of non mass enhancement with benign MR features in each breast.  Right breast: A large heterogeneously enhancing irregular mass with spiculated and irregular margins is identified in the upper midline right breast at anterior depth with internal focus of metallic clip artifact consistent with biopsy-proven disease. This mass measures approximately 4.4 x 3 x 2.5 cm (transverse by AP by CC dimension). Incidental note is made of  prominent associated vasculature. Directly inferior to the index mass by approximately 1.5 cm, note is made of a sub cm round mass with irregular margins most consistent with a small satellite lesion. Clumped non mass enhancement extends from the anterior margin of the index mass to less than 1 cm from the base of the nipple.  Left breast: No abnormal mass or enhancement in the left breast.  Lymph nodes: The axillary and internal mammary lymph node chains are symmetric.  Ancillary findings:  None.  IMPRESSION: 1. Biopsy-proven malignancy in the upper midline right breast measures 4.4 x 3 x 2.5 cm. A small satellite mass is identified approximately 1.5 cm directly inferior to the index lesion. 2. Abnormal enhancement at site of disease extends to within 1 cm from the base of the right nipple. 3. No MR findings to suggest contralateral malignancy. 4. No adenopathy.  RECOMMENDATION: Recommend continued surgical management.  BI-RADS CATEGORY  6: Known biopsy-proven malignancy.   Electronically Signed   By: Andres Shad   On: 12/23/2013 12:01   Mm Digital Diagnostic Bilat  12/16/2013   CLINICAL DATA:  Patient is a 58 year old with a family history breast cancer diagnosed in aunt at age 24 who reports focal bruising on the left breast since March. [name] also describes bilateral breast tenderness.  EXAM: DIGITAL DIAGNOSTIC  BILATERAL MAMMOGRAM WITH CAD  ULTRASOUND BILATERAL BREAST  COMPARISON:  01/22/2006 mammogram  ACR Breast Density Category c: The breast tissue is heterogeneously dense, which may obscure small masses.  FINDINGS: Digital bilateral diagnostic mammography demonstrates a heterogeneously dense parenchymal pattern which may obscure detection of small masses. A metallic BB has been placed by the technologist overlying the lower inner left breast in region of concern as indicated by the patient. Spot-compression view in the tangential projection reveals effacement of normal fibroglandular tissue.  In the  right breast, an asymmetric density is identified in the upper midline right breast at anterior depth. This minimally effaces with spot compression magnification views and is associated with coarse heterogeneous microcalcifications spanning approximately 4 x 4 x 4.5 cm. This finding is highly suggestive of malignancy.  Mammographic images were processed with CAD.  On physical exam, a firm palpable mass is identified in the upper slightly outer right breast.  Targeted ultrasound is performed in the palpable area of concern in the right breast in the focal area of skin discoloration in the left breast. In the right breast examination demonstrates a heterogeneously hypoechoic irregular mass with indistinct margins at 11 o'clock 2 cm from the nipple. Given the indistinct margins exact dimensions are difficult to determine, though this measures approximately 4.1 x 3.3 x 1.9 cm in diameter and represents an excellent sonographic correlate to the mammographic finding of  note. Scattered areas of internal increased echogenicity are identified under real-time sonography suggestive of internal calcium. The leading edge of this mass is approximately 2 cm from the base of the nipple. Ultrasound of the right axilla demonstrates a few morphologically normal lymph nodes.  A focal spot of discoloration/erythema is visually identified in the inferior midline left breast at 6 o'clock approximately 7 cm from the nipple. The patient reports this has been present and unchanged since March of this year. Ultrasound evaluation demonstrates a heterogeneously hypoechoic oval mass with parallel orientation located entirely within the skin surface measuring approximately 1 x 1.2 x 0.2 cm in diameter. These findings are nonspecific on ultrasound, but differential considerations include a benign dermal cyst  IMPRESSION: 1. A palpable 4.1 cm diameter mass/asymmetry with associated coarse heterogeneous microcalcifications at 11 o'clock 2 cm from  the right nipple demonstrates mammographic and sonographic features highly suggestive of malignancy. 2. Focal discoloration of the skin in the inferior midline left breast corresponds to a 1.2 cm diameter dermal based mass.  RECOMMENDATION: 1. Recommend ultrasound-guided core needle biopsy of the palpable mass in the upper slightly outer right breast. 2. Recommend continued clinical management and consideration of referral to a dermatologist for the 1.2 cm diameter dermal based mass with associated discoloration of the skin in the inferior midline left breast. 3. Patient is tentatively scheduled for ultrasound-guided core needle biopsy the right breast later today.  I have discussed the findings and recommendations with the patient. Results were also provided in writing at the conclusion of the visit. If applicable, a reminder letter will be sent to the patient regarding the next appointment.  BI-RADS CATEGORY  5: Highly suggestive of malignancy.   Electronically Signed   By: Andres Shad   On: 12/16/2013 11:00   Mm Digital Diagnostic Unilat R  12/16/2013   CLINICAL DATA:  Status post ultrasound-guided core needle biopsy of the right breast  EXAM: DIAGNOSTIC RIGHT MAMMOGRAM POST ULTRASOUND BIOPSY  COMPARISON:  Previous exams  FINDINGS: Mammographic images were obtained following ultrasound guided biopsy of a palpable mass in the upper outer right breast. This demonstrates satisfactory positioning of the ribbon metal tissue marker located near the center of the biopsied mass/asymmetry with associated heterogeneous microcalcification. Minimal post biopsy changes are also identified.  IMPRESSION: Satisfactory positioning of the ribbon shaped metal tissue marker following ultrasound-guided core needle biopsy. Pathology is pending.  Final Assessment: Post Procedure Mammograms for Marker Placement   Electronically Signed   By: Andres Shad   On: 12/16/2013 11:50   US Breast Ltd Uni Left Inc Axilla  12/16/2013    CLINICAL DATA:  Patient is a 58 year old with a family history breast cancer diagnosed in aunt at age 58 who reports focal bruising on the left breast since March. [name] also describes bilateral breast tenderness.  EXAM: DIGITAL DIAGNOSTIC  BILATERAL MAMMOGRAM WITH CAD  ULTRASOUND BILATERAL BREAST  COMPARISON:  01/22/2006 mammogram  ACR Breast Density Category c: The breast tissue is heterogeneously dense, which may obscure small masses.  FINDINGS: Digital bilateral diagnostic mammography demonstrates a heterogeneously dense parenchymal pattern which may obscure detection of small masses. A metallic BB has been placed by the technologist overlying the lower inner left breast in region of concern as indicated by the patient. Spot-compression view in the tangential projection reveals effacement of normal fibroglandular tissue.  In the right breast, an asymmetric density is identified in the upper midline right breast at anterior depth. This minimally effaces with spot compression magnification views  and is associated with coarse heterogeneous microcalcifications spanning approximately 4 x 4 x 4.5 cm. This finding is highly suggestive of malignancy.  Mammographic images were processed with CAD.  On physical exam, a firm palpable mass is identified in the upper slightly outer right breast.  Targeted ultrasound is performed in the palpable area of concern in the right breast in the focal area of skin discoloration in the left breast. In the right breast examination demonstrates a heterogeneously hypoechoic irregular mass with indistinct margins at 11 o'clock 2 cm from the nipple. Given the indistinct margins exact dimensions are difficult to determine, though this measures approximately 4.1 x 3.3 x 1.9 cm in diameter and represents an excellent sonographic correlate to the mammographic finding of note. Scattered areas of internal increased echogenicity are identified under real-time sonography suggestive of internal  calcium. The leading edge of this mass is approximately 2 cm from the base of the nipple. Ultrasound of the right axilla demonstrates a few morphologically normal lymph nodes.  A focal spot of discoloration/erythema is visually identified in the inferior midline left breast at 6 o'clock approximately 7 cm from the nipple. The patient reports this has been present and unchanged since March of this year. Ultrasound evaluation demonstrates a heterogeneously hypoechoic oval mass with parallel orientation located entirely within the skin surface measuring approximately 1 x 1.2 x 0.2 cm in diameter. These findings are nonspecific on ultrasound, but differential considerations include a benign dermal cyst  IMPRESSION: 1. A palpable 4.1 cm diameter mass/asymmetry with associated coarse heterogeneous microcalcifications at 11 o'clock 2 cm from the right nipple demonstrates mammographic and sonographic features highly suggestive of malignancy. 2. Focal discoloration of the skin in the inferior midline left breast corresponds to a 1.2 cm diameter dermal based mass.  RECOMMENDATION: 1. Recommend ultrasound-guided core needle biopsy of the palpable mass in the upper slightly outer right breast. 2. Recommend continued clinical management and consideration of referral to a dermatologist for the 1.2 cm diameter dermal based mass with associated discoloration of the skin in the inferior midline left breast. 3. Patient is tentatively scheduled for ultrasound-guided core needle biopsy the right breast later today.  I have discussed the findings and recommendations with the patient. Results were also provided in writing at the conclusion of the visit. If applicable, a reminder letter will be sent to the patient regarding the next appointment.  BI-RADS CATEGORY  5: Highly suggestive of malignancy.   Electronically Signed   By: Andres Shad   On: 12/16/2013 11:00   US Breast Ltd Uni Right Inc Axilla  12/16/2013   CLINICAL DATA:   Patient is a 58 year old with a family history breast cancer diagnosed in aunt at age 37 who reports focal bruising on the left breast since March. [name] also describes bilateral breast tenderness.  EXAM: DIGITAL DIAGNOSTIC  BILATERAL MAMMOGRAM WITH CAD  ULTRASOUND BILATERAL BREAST  COMPARISON:  01/22/2006 mammogram  ACR Breast Density Category c: The breast tissue is heterogeneously dense, which may obscure small masses.  FINDINGS: Digital bilateral diagnostic mammography demonstrates a heterogeneously dense parenchymal pattern which may obscure detection of small masses. A metallic BB has been placed by the technologist overlying the lower inner left breast in region of concern as indicated by the patient. Spot-compression view in the tangential projection reveals effacement of normal fibroglandular tissue.  In the right breast, an asymmetric density is identified in the upper midline right breast at anterior depth. This minimally effaces with spot compression magnification views and is  associated with coarse heterogeneous microcalcifications spanning approximately 4 x 4 x 4.5 cm. This finding is highly suggestive of malignancy.  Mammographic images were processed with CAD.  On physical exam, a firm palpable mass is identified in the upper slightly outer right breast.  Targeted ultrasound is performed in the palpable area of concern in the right breast in the focal area of skin discoloration in the left breast. In the right breast examination demonstrates a heterogeneously hypoechoic irregular mass with indistinct margins at 11 o'clock 2 cm from the nipple. Given the indistinct margins exact dimensions are difficult to determine, though this measures approximately 4.1 x 3.3 x 1.9 cm in diameter and represents an excellent sonographic correlate to the mammographic finding of note. Scattered areas of internal increased echogenicity are identified under real-time sonography suggestive of internal calcium. The leading  edge of this mass is approximately 2 cm from the base of the nipple. Ultrasound of the right axilla demonstrates a few morphologically normal lymph nodes.  A focal spot of discoloration/erythema is visually identified in the inferior midline left breast at 6 o'clock approximately 7 cm from the nipple. The patient reports this has been present and unchanged since March of this year. Ultrasound evaluation demonstrates a heterogeneously hypoechoic oval mass with parallel orientation located entirely within the skin surface measuring approximately 1 x 1.2 x 0.2 cm in diameter. These findings are nonspecific on ultrasound, but differential considerations include a benign dermal cyst  IMPRESSION: 1. A palpable 4.1 cm diameter mass/asymmetry with associated coarse heterogeneous microcalcifications at 11 o'clock 2 cm from the right nipple demonstrates mammographic and sonographic features highly suggestive of malignancy. 2. Focal discoloration of the skin in the inferior midline left breast corresponds to a 1.2 cm diameter dermal based mass.  RECOMMENDATION: 1. Recommend ultrasound-guided core needle biopsy of the palpable mass in the upper slightly outer right breast. 2. Recommend continued clinical management and consideration of referral to a dermatologist for the 1.2 cm diameter dermal based mass with associated discoloration of the skin in the inferior midline left breast. 3. Patient is tentatively scheduled for ultrasound-guided core needle biopsy the right breast later today.  I have discussed the findings and recommendations with the patient. Results were also provided in writing at the conclusion of the visit. If applicable, a reminder letter will be sent to the patient regarding the next appointment.  BI-RADS CATEGORY  5: Highly suggestive of malignancy.   Electronically Signed   By: Andres Shad   On: 12/16/2013 11:00   [place] Rt Breast Bx W Loc Dev 1st Lesion Img Bx Spec US Guide  12/17/2013   ADDENDUM REPORT:  12/17/2013 17:03  ADDENDUM: Pathology for the ultrasound-guided core needle biopsy of the right breast is reported as invasive ductal carcinoma and ductal carcinoma in situ. Please refer to pathology report for further details. The malignant histology is concordant with pre biopsy imaging. Recommend consultation with a breast surgeon. The patient is scheduled for the multidisciplinary Clinic on 12/29/2013 at a.m. These results and recommendations were discussed with the patient by telephone at 4:15 p.m. on 12/17/2013.   Electronically Signed   By: Andres Shad   On: 12/17/2013 17:03   12/17/2013   CLINICAL DATA:  Patient is a 58 year old female with a palpable mass in the upper outer right breast highly suggestive for malignancy.  EXAM: ULTRASOUND GUIDED RIGHT BREAST CORE NEEDLE BIOPSY  COMPARISON:  Previous exams.  PROCEDURE: I met with the patient and we discussed the procedure of ultrasound-guided  biopsy, including benefits and alternatives. We discussed the high likelihood of a successful procedure. We discussed the risks of the procedure including infection, bleeding, tissue injury, clip migration, and inadequate sampling. Informed written consent was given. The usual time-out protocol was performed immediately prior to the procedure.  Using sterile technique and 2% Lidocaine as local anesthetic, under direct ultrasound visualization, a 12 gauge vacuum-assisteddevice was used to perform biopsy of hypoechoic irregular palpable mass using a lateral approach. At the conclusion of the procedure, a ribbon shaped tissue marker clip was deployed into biopsied mass. Follow-up 2-view mammogram was performed and dictated separately.  IMPRESSION: Ultrasound-guided biopsy of a palpable irregular mass in the upper outer right breast. No apparent complications. Pathology is pending.  Electronically Signed: By: Andres Shad On: 12/16/2013 11:51      ASSESSMENT AND PLAN: Breast cancer of upper-outer quadrant of right  female breast Pt has a T2 tumor with a sattellite nodule inferior to this.  We will biopsy this to ascertain if [name] is a breast conservation candidate.    [name] will get neoadjuvant chemotherapy for her 2 positive disease and for size.  If the second area is not cancer, we may be able to do breast conservation.    I reviewed port a cath placement with her.  I discussed risks of bleeding, infection, damage to adjacent structures, need for additional procedures, malposition, malfunction, pneumothorax, etc.    I discussed post op restrictions and recovery.  I advised that [name] will have restrictions for 1 week.    I will see her back in for the port, then near the midpoint of the chemotherapy.     45 min spent in evaluation, examination, counseling, and coordination of care.    Milus Height MD Surgical Oncology, General and [facility]      Visit Diagnoses: 1. Breast cancer of upper-outer quadrant of right female breast     Primary Care Physician: Lamar Blinks, MD  Other care team Bertis Ruddy

## 2013-12-29 NOTE — Progress Notes (Signed)
Checked in new patient with no financial issues prior to seeing the dr. She has appt card and I gave her [facility] packet. She has not been out of the country and I left message for Gabriel Cirri that she is here for [facility]

## 2013-12-29 NOTE — Progress Notes (Unsigned)
Patient was seen by RD during [facility] on 12/29/2013  Provided pt with folder of educational materials regarding general nutrition recommendations for breast cancer patients, plant-based diets, antioxidants, cancer facts vs myths, and information on organic foods  Explained importance of healthy nutrition during treatments and encouraged pt to consume daily recommended amount of fruits and vegetables, emphasizing variety of intake for maximum antioxidant and synergistic health benefits. Promoted adequate fiber intake, with use of whole grain and whole wheat products, beans, and lentils. Encouraged patient to follow a low fat diet with use of heart healthy fats, and to opt for plant-based proteins weekly  Diet recall indicated pt typically skips breakfast, and consumes large fast food breakfast on weekends. Pt tends to consume fast food during lunch break, and will occasionally cook pasta dishes or pizza for dinners.  Patient had questions regarding general breast cancer diet recommendation. Pt's son concerned with pt's pain/fatigue from rheumatoid arthritis and the relationship with his mother's poor eating habits. Discussed affordable breakfast supplements, healthier fast food options, and reviewed possible snack/lunch ideas to bring to work.   Encouraged pt to maintain healthy weight during treatments, with gradual weight loss as warranted post procedures.  Expect fair compliance. Pt's son in agreement to assist with patient's compliance with diet recommendations. Encouraged pt to set weekly goals to make specific, obtainable changes. Appeared apprehensive at first, however more agreeable and optimistic and end of education.  Provided pt with outpatient oncology RD contact information. Encouraged pt to contact RD with additional follow up questions or nutrition-related concerns.  Atlee Abide MS RD LDN Clinical Dietitian [phone]

## 2013-12-29 NOTE — Assessment & Plan Note (Signed)
Pt has a T2 tumor with a sattellite nodule inferior to this.  We will biopsy this to ascertain if she is a breast conservation candidate.    She will get neoadjuvant chemotherapy for her 2 positive disease and for size.  If the second area is not cancer, we may be able to do breast conservation.    I reviewed port a cath placement with her.  I discussed risks of bleeding, infection, damage to adjacent structures, need for additional procedures, malposition, malfunction, pneumothorax, etc.    I discussed post op restrictions and recovery.  I advised that she will have restrictions for 1 week.    I will see her back in for the port, then near the midpoint of the chemotherapy.

## 2013-12-29 NOTE — Telephone Encounter (Signed)
Per staff message and POF I have scheduled appts. Advised scheduler of appts. JMW  

## 2013-12-29 NOTE — Assessment & Plan Note (Addendum)
1. 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  Discussed with her extensively about her pathology report including the receptors and their significance based on the chewable discussion we recommend neoadjuvant chemotherapy with Taxotere carboplatin Herceptin and Perclose amount given once every 3 weeks for 6 cycles followed by restaging MRI of the breasts followed by surgery. Menarche complete staging I would like to get a PET/CT scan.  We will obtain consultation from surgery to place a port. we'll also get an echocardiogram (pre-herceptin) and establish her for chemotherapy education class. Genetics consultation would also be obtained in her extensive family history of multiple cancers including breast cancer.  Discussed healthcare power of attorney: she will be provided paperwork to fill that.  2. Goals of chemotherapy: cure assuming PET/CT is negative for distant metastatic disease 3. chemotherapy counseling: I discussed the risks and benefits of chemotherapy including the risk of allergy or anaphylaxis risk of infection fatigue bruising from low blood counts risk of nausea vomiting cardiac toxicities related to Herceptin Perjeta and she appears to understand these risks and benefits and is willing to proceed with the plan.

## 2013-12-29 NOTE — Patient Instructions (Signed)
Implanted Port Insertion  An implanted port is a central line that has a round shape and is placed under the skin. It is used as a long-term IV access for:    Medicines, such as chemotherapy.    Fluids.    Liquid nutrition, such as total parenteral nutrition (TPN).    Blood samples.   LET YOUR HEALTH CARE PROVIDER KNOW ABOUT:   Allergies to food or medicine.    Medicines taken, including vitamins, herbs, eye drops, creams, and over-the-counter medicines.    Any allergies to heparin.   Use of steroids (by mouth or creams).    Previous problems with anesthetics or numbing medicines.    History of bleeding problems or blood clots.    Previous surgery.    Other health problems, including diabetes and kidney problems.    Possibility of pregnancy, if this applies.  RISKS AND COMPLICATIONS  Generally, this is a safe procedure. However, as with any procedure, problems can occur. Possible problems include:   Damage to the blood vessel, bruising, or bleeding at the puncture site.    Infection.   Blood clot in the vessel that the port is in.   Breakdown of the skin over your port.   Very rarely a person may develop a condition called a pneumothorax, a collection of air in the chest that may cause one of the lungs to collapse. The placement of these catheters with the appropriate imaging guidance significantly decreases the risk of a pneumothorax.   BEFORE THE PROCEDURE    Your health care provider may want you to have blood tests. These tests can help tell how well your kidneys and liver are working. They can also show how well your blood clots.    If you take blood thinners (anticoagulant medicines), ask your health care provider when you should stop taking them.    Make arrangements for someone to drive you home. This is necessary if you have been sedated for your procedure.   PROCEDURE   Port insertion usually takes about 30-45 minutes.    An IV needle will be inserted in your arm.  Medicine for pain and medicine to help relax you (sedative) will flow directly into your body through this needle.    You will lie on an exam table, and you will be connected to monitors to keep track of your heart rate, blood pressure, and breathing throughout the procedure.   An oxygen monitoring device may be attached to your finger. Oxygen will be given.    Everything will be kept as germ free (sterile) as possible during the procedure. The skin near the point of the incision will be cleansed with antiseptic, and the area will be draped with sterile towels. The skin and deeper tissues over the port area will be made numb with a local anesthetic.   Two small cuts (incisions) will be made in the skin to insert the port. One will be made in the neck to obtain access to the vein where the catheter will lie.    Because the port reservoir will be placed under the skin, a small skin incision will be made in the upper chest, and a small pocket for the port will be made under the skin. The catheter that will be connected to the port tunnels to a large central vein in the chest. A small, raised area will remain on your body at the site of the reservoir when the procedure is complete.   The port placement   will be done under imaging guidance to ensure the proper placement.   The reservoir has a silicone covering that can be punctured with a special needle.    The port will be flushed with normal saline, and blood will be drawn to make sure it is working properly.   There will be nothing remaining outside the skin when the procedure is finished.    Incisions will be held together by stitches, surgical glue, or a special tape.  AFTER THE PROCEDURE   You will stay in a recovery area until the anesthesia has worn off. Your blood pressure and pulse will be checked.   A final chest X-ray will be taken to check the placement of the port and to ensure that there is no injury to your lung.  Document Released:  02/24/2013 Document Revised: 09/20/2013 Document Reviewed: 02/24/2013  [employer] Patient Information 2015 [employer]. This information is not intended to replace advice given to you by your health care provider. Make sure you discuss any questions you have with your health care provider.

## 2013-12-29 NOTE — Progress Notes (Signed)
Radiation Oncology         [phone]) [phone] ________________________________  Initial outpatient Consultation - Date: 12/29/2013   Name: [name] MRN: [id number]   DOB: 08-Oct-1955  REFERRING PHYSICIAN: [name], MD  STAGE: Breast cancer of upper-outer quadrant of right female breast   Primary site: Breast (Right)   Staging method: AJCC 7th Edition   Clinical: Stage IIA (T2, N0, cM0)   Summary: Stage IIA (T2, N0, cM0)   Clinical comments: Staged at breast conference 12/29/13.  HISTORY OF PRESENT ILLNESS::[name] is a 58 y.o. female  Who presented for a mammogram after noticing bruising on the left breast.  Her mammogram on that side was negative for malignancy.  However on the right breast calcifications were noted measuring about 4 cm. 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 This was ER positive PR negative HER-2 positive with a Ki-67 of 24%. MRI of the bilateral breasts was performed which showed a 4 cm mass with a satellite nodule about a centimeter away and clumped enhancement extending almost to the base of nipple. She has had some soreness in her right breast since her biopsy. She presents with her son today for treatment recommendations for multidisciplinary clinic. She has met with surgery medical oncology. She is interested in breast conservation. She has no personal history of breast cancer. She is GX P1. She had menses at 13 and is postmenopausal. She never took hormone replacement therapy.Marland Kitchen  PREVIOUS RADIATION THERAPY: No  PAST MEDICAL HISTORY:  has a past medical history of Perimenopausal (7/05); GERD (gastroesophageal reflux disease); PUD (peptic ulcer disease); Fibromyalgia; Rheumatoid arthritis(714.0); SVT (supraventricular tachycardia); Depression; Osteoporosis; Hepatitis C; and Anxiety.    PAST SURGICAL HISTORY: Past Surgical History  Procedure  Laterality Date  . Cesarean section  09/17/80  . Dilation and curettage of uterus    . Hysterectomy-for dysmenorrhea  09/17/00  . Nasal sinus surgery  03/20/84    for deviated septum  . Tonsillectomy  05/20/61  . Abdominal hysterectomy      FAMILY HISTORY:  Family History  Problem Relation Age of Onset  . Colon cancer Neg Hx   . Cancer Father 33    lung  . Dementia Father   . Dementia Mother   . Hypertension Mother   . Hyperlipidemia Mother   . Breast cancer Maternal Aunt   . Cancer Paternal Uncle   . Breast cancer Maternal Aunt   . Breast cancer Maternal Aunt     SOCIAL HISTORY:  History  Substance Use Topics  . Smoking status: Current Every Day Smoker -- 2.00 packs/day for 45 years    Types: Cigarettes  . Smokeless tobacco: Never Used     Comment: 2 ppd since 58 yo   . Alcohol Use: Yes     Comment: denies; reports 3-4 beers/night to Dr. 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 ALPRAZolam (XANAX) 0.5 MG tablet TAKE 1 TABLET BY MOUTH TWICE A DAY AS NEEDED  60 tablet  5  . atenolol (TENORMIN) 50 MG tablet TAKE 1 TABLET BY MOUTH 2 TIMES A DAY  60 tablet  12  . Multiple Vitamin (MULTIVITAMIN) tablet Take 1 tablet by mouth daily.        . naproxen sodium (  ANAPROX) 220 MG tablet Take 220 mg by mouth 2 (two) times daily with a meal.      . omeprazole (PRILOSEC) 20 MG capsule Take 20 mg by mouth daily.      . sertraline (ZOLOFT) 50 MG tablet Take 1 tablet (50 mg total) by mouth daily.  30 tablet  5   No current facility-administered medications for this encounter.    REVIEW OF SYSTEMS:  A 15 point review of systems is documented in the electronic medical record. This was obtained by the nursing staff. However, I reviewed this with the patient to discuss relevant findings and make appropriate changes.  Pertinent items are noted in HPI.  PHYSICAL  EXAM: There were no vitals filed for this visit.. . Pleasant female in no distress sitting comfortably examining table. Alert and oriented x3. Bruising associated with a palpable mass in the upper outer position of the right breast. No cervical, supraclavicular, or axillary adenopathy. No palpable abnormalities of the left breast. CN II-XII are tested and intact.   LABORATORY DATA:  Lab Results  Component Value Date   WBC 9.6 12/29/2013   HGB 14.8 12/29/2013   HCT 45.0 12/29/2013   MCV 89.8 12/29/2013   PLT 243 12/29/2013   Lab Results  Component Value Date   NA 139 12/29/2013   K 3.9 12/29/2013   CL 104 04/03/2013   CO2 21* 12/29/2013   Lab Results  Component Value Date   ALT 20 12/29/2013   AST 24 12/29/2013   ALKPHOS 99 12/29/2013   BILITOT 0.46 12/29/2013     RADIOGRAPHY: Mr Breast Bilateral W Wo Contrast  12/23/2013   CLINICAL DATA:  Patient is a 58 year old female with recent diagnosis of right breast IDC and DCIS on image guided biopsy.  LABS:  Creatinine 0.5, BUN 13, GFR 127  EXAM: BILATERAL BREAST MRI WITH AND WITHOUT CONTRAST  TECHNIQUE: Multiplanar, multisequence MR images of both breasts were obtained prior to and following the intravenous administration of 82m of MultiHance.  THREE-DIMENSIONAL MR IMAGE RENDERING ON INDEPENDENT WORKSTATION:  Three-dimensional MR images were rendered by post-processing of the original MR data on an independent workstation. The three-dimensional MR images were interpreted, and findings are reported in the following complete MRI report for this study. Three dimensional images were evaluated at the independent DynaCad workstation  COMPARISON:  12/16/2013 mammography  FINDINGS: Breast composition: Heterogeneous fibroglandular tissue.  Background parenchymal enhancement: The multiplanar, multi sequence bilateral breast MR demonstrates moderate background parenchymal enhancement with scattered foci of non mass enhancement with benign MR features in each breast.   Right breast: A large heterogeneously enhancing irregular mass with spiculated and irregular margins is identified in the upper midline right breast at anterior depth with internal focus of metallic clip artifact consistent with biopsy-proven disease. This mass measures approximately 4.4 x 3 x 2.5 cm (transverse by AP by CC dimension). Incidental note is made of prominent associated vasculature. Directly inferior to the index mass by approximately 1.5 cm, note is made of a sub cm round mass with irregular margins most consistent with a small satellite lesion. Clumped non mass enhancement extends from the anterior margin of the index mass to less than 1 cm from the base of the nipple.  Left breast: No abnormal mass or enhancement in the left breast.  Lymph nodes: The axillary and internal mammary lymph node chains are symmetric.  Ancillary findings:  None.  IMPRESSION: 1. Biopsy-proven malignancy in the upper midline right breast measures 4.4 x 3 x  2.5 cm. A small satellite mass is identified approximately 1.5 cm directly inferior to the index lesion. 2. Abnormal enhancement at site of disease extends to within 1 cm from the base of the right nipple. 3. No MR findings to suggest contralateral malignancy. 4. No adenopathy.  RECOMMENDATION: Recommend continued surgical management.  BI-RADS CATEGORY  6: Known biopsy-proven malignancy.   Electronically Signed   By: Andres Shad   On: 12/23/2013 12:01   Mm Digital Diagnostic Bilat  12/16/2013   CLINICAL DATA:  Patient is a 58 year old with a family history breast cancer diagnosed in aunt at age 56 who reports focal bruising on the left breast since March. She also describes bilateral breast tenderness.  EXAM: DIGITAL DIAGNOSTIC  BILATERAL MAMMOGRAM WITH CAD  ULTRASOUND BILATERAL BREAST  COMPARISON:  01/22/2006 mammogram  ACR Breast Density Category c: The breast tissue is heterogeneously dense, which may obscure small masses.  FINDINGS: Digital bilateral diagnostic  mammography demonstrates a heterogeneously dense parenchymal pattern which may obscure detection of small masses. A metallic BB has been placed by the technologist overlying the lower inner left breast in region of concern as indicated by the patient. Spot-compression view in the tangential projection reveals effacement of normal fibroglandular tissue.  In the right breast, an asymmetric density is identified in the upper midline right breast at anterior depth. This minimally effaces with spot compression magnification views and is associated with coarse heterogeneous microcalcifications spanning approximately 4 x 4 x 4.5 cm. This finding is highly suggestive of malignancy.  Mammographic images were processed with CAD.  On physical exam, a firm palpable mass is identified in the upper slightly outer right breast.  Targeted ultrasound is performed in the palpable area of concern in the right breast in the focal area of skin discoloration in the left breast. In the right breast examination demonstrates a heterogeneously hypoechoic irregular mass with indistinct margins at 11 o'clock 2 cm from the nipple. Given the indistinct margins exact dimensions are difficult to determine, though this measures approximately 4.1 x 3.3 x 1.9 cm in diameter and represents an excellent sonographic correlate to the mammographic finding of note. Scattered areas of internal increased echogenicity are identified under real-time sonography suggestive of internal calcium. The leading edge of this mass is approximately 2 cm from the base of the nipple. Ultrasound of the right axilla demonstrates a few morphologically normal lymph nodes.  A focal spot of discoloration/erythema is visually identified in the inferior midline left breast at 6 o'clock approximately 7 cm from the nipple. The patient reports this has been present and unchanged since March of this year. Ultrasound evaluation demonstrates a heterogeneously hypoechoic oval mass with  parallel orientation located entirely within the skin surface measuring approximately 1 x 1.2 x 0.2 cm in diameter. These findings are nonspecific on ultrasound, but differential considerations include a benign dermal cyst  IMPRESSION: 1. A palpable 4.1 cm diameter mass/asymmetry with associated coarse heterogeneous microcalcifications at 11 o'clock 2 cm from the right nipple demonstrates mammographic and sonographic features highly suggestive of malignancy. 2. Focal discoloration of the skin in the inferior midline left breast corresponds to a 1.2 cm diameter dermal based mass.  RECOMMENDATION: 1. Recommend ultrasound-guided core needle biopsy of the palpable mass in the upper slightly outer right breast. 2. Recommend continued clinical management and consideration of referral to a dermatologist for the 1.2 cm diameter dermal based mass with associated discoloration of the skin in the inferior midline left breast. 3. Patient is tentatively scheduled for  ultrasound-guided core needle biopsy the right breast later today.  I have discussed the findings and recommendations with the patient. Results were also provided in writing at the conclusion of the visit. If applicable, a reminder letter will be sent to the patient regarding the next appointment.  BI-RADS CATEGORY  5: Highly suggestive of malignancy.   Electronically Signed   By: Andres Shad   On: 12/16/2013 11:00   Mm Digital Diagnostic Unilat R  12/16/2013   CLINICAL DATA:  Status post ultrasound-guided core needle biopsy of the right breast  EXAM: DIAGNOSTIC RIGHT MAMMOGRAM POST ULTRASOUND BIOPSY  COMPARISON:  Previous exams  FINDINGS: Mammographic images were obtained following ultrasound guided biopsy of a palpable mass in the upper outer right breast. This demonstrates satisfactory positioning of the ribbon metal tissue marker located near the center of the biopsied mass/asymmetry with associated heterogeneous microcalcification. Minimal post biopsy  changes are also identified.  IMPRESSION: Satisfactory positioning of the ribbon shaped metal tissue marker following ultrasound-guided core needle biopsy. Pathology is pending.  Final Assessment: Post Procedure Mammograms for Marker Placement   Electronically Signed   By: Andres Shad   On: 12/16/2013 11:50   US Breast Ltd Uni Left Inc Axilla  12/16/2013   CLINICAL DATA:  Patient is a 58 year old with a family history breast cancer diagnosed in aunt at age 55 who reports focal bruising on the left breast since March. She also describes bilateral breast tenderness.  EXAM: DIGITAL DIAGNOSTIC  BILATERAL MAMMOGRAM WITH CAD  ULTRASOUND BILATERAL BREAST  COMPARISON:  01/22/2006 mammogram  ACR Breast Density Category c: The breast tissue is heterogeneously dense, which may obscure small masses.  FINDINGS: Digital bilateral diagnostic mammography demonstrates a heterogeneously dense parenchymal pattern which may obscure detection of small masses. A metallic BB has been placed by the technologist overlying the lower inner left breast in region of concern as indicated by the patient. Spot-compression view in the tangential projection reveals effacement of normal fibroglandular tissue.  In the right breast, an asymmetric density is identified in the upper midline right breast at anterior depth. This minimally effaces with spot compression magnification views and is associated with coarse heterogeneous microcalcifications spanning approximately 4 x 4 x 4.5 cm. This finding is highly suggestive of malignancy.  Mammographic images were processed with CAD.  On physical exam, a firm palpable mass is identified in the upper slightly outer right breast.  Targeted ultrasound is performed in the palpable area of concern in the right breast in the focal area of skin discoloration in the left breast. In the right breast examination demonstrates a heterogeneously hypoechoic irregular mass with indistinct margins at 11 o'clock 2 cm  from the nipple. Given the indistinct margins exact dimensions are difficult to determine, though this measures approximately 4.1 x 3.3 x 1.9 cm in diameter and represents an excellent sonographic correlate to the mammographic finding of note. Scattered areas of internal increased echogenicity are identified under real-time sonography suggestive of internal calcium. The leading edge of this mass is approximately 2 cm from the base of the nipple. Ultrasound of the right axilla demonstrates a few morphologically normal lymph nodes.  A focal spot of discoloration/erythema is visually identified in the inferior midline left breast at 6 o'clock approximately 7 cm from the nipple. The patient reports this has been present and unchanged since March of this year. Ultrasound evaluation demonstrates a heterogeneously hypoechoic oval mass with parallel orientation located entirely within the skin surface measuring approximately 1 x 1.2 x 0.2  cm in diameter. These findings are nonspecific on ultrasound, but differential considerations include a benign dermal cyst  IMPRESSION: 1. A palpable 4.1 cm diameter mass/asymmetry with associated coarse heterogeneous microcalcifications at 11 o'clock 2 cm from the right nipple demonstrates mammographic and sonographic features highly suggestive of malignancy. 2. Focal discoloration of the skin in the inferior midline left breast corresponds to a 1.2 cm diameter dermal based mass.  RECOMMENDATION: 1. Recommend ultrasound-guided core needle biopsy of the palpable mass in the upper slightly outer right breast. 2. Recommend continued clinical management and consideration of referral to a dermatologist for the 1.2 cm diameter dermal based mass with associated discoloration of the skin in the inferior midline left breast. 3. Patient is tentatively scheduled for ultrasound-guided core needle biopsy the right breast later today.  I have discussed the findings and recommendations with the patient.  Results were also provided in writing at the conclusion of the visit. If applicable, a reminder letter will be sent to the patient regarding the next appointment.  BI-RADS CATEGORY  5: Highly suggestive of malignancy.   Electronically Signed   By: Andres Shad   On: 12/16/2013 11:00   US Breast Ltd Uni Right Inc Axilla  12/16/2013   CLINICAL DATA:  Patient is a 58 year old with a family history breast cancer diagnosed in aunt at age 49 who reports focal bruising on the left breast since March. She also describes bilateral breast tenderness.  EXAM: DIGITAL DIAGNOSTIC  BILATERAL MAMMOGRAM WITH CAD  ULTRASOUND BILATERAL BREAST  COMPARISON:  01/22/2006 mammogram  ACR Breast Density Category c: The breast tissue is heterogeneously dense, which may obscure small masses.  FINDINGS: Digital bilateral diagnostic mammography demonstrates a heterogeneously dense parenchymal pattern which may obscure detection of small masses. A metallic BB has been placed by the technologist overlying the lower inner left breast in region of concern as indicated by the patient. Spot-compression view in the tangential projection reveals effacement of normal fibroglandular tissue.  In the right breast, an asymmetric density is identified in the upper midline right breast at anterior depth. This minimally effaces with spot compression magnification views and is associated with coarse heterogeneous microcalcifications spanning approximately 4 x 4 x 4.5 cm. This finding is highly suggestive of malignancy.  Mammographic images were processed with CAD.  On physical exam, a firm palpable mass is identified in the upper slightly outer right breast.  Targeted ultrasound is performed in the palpable area of concern in the right breast in the focal area of skin discoloration in the left breast. In the right breast examination demonstrates a heterogeneously hypoechoic irregular mass with indistinct margins at 11 o'clock 2 cm from the nipple. Given the  indistinct margins exact dimensions are difficult to determine, though this measures approximately 4.1 x 3.3 x 1.9 cm in diameter and represents an excellent sonographic correlate to the mammographic finding of note. Scattered areas of internal increased echogenicity are identified under real-time sonography suggestive of internal calcium. The leading edge of this mass is approximately 2 cm from the base of the nipple. Ultrasound of the right axilla demonstrates a few morphologically normal lymph nodes.  A focal spot of discoloration/erythema is visually identified in the inferior midline left breast at 6 o'clock approximately 7 cm from the nipple. The patient reports this has been present and unchanged since March of this year. Ultrasound evaluation demonstrates a heterogeneously hypoechoic oval mass with parallel orientation located entirely within the skin surface measuring approximately 1 x 1.2 x 0.2 cm in  diameter. These findings are nonspecific on ultrasound, but differential considerations include a benign dermal cyst  IMPRESSION: 1. A palpable 4.1 cm diameter mass/asymmetry with associated coarse heterogeneous microcalcifications at 11 o'clock 2 cm from the right nipple demonstrates mammographic and sonographic features highly suggestive of malignancy. 2. Focal discoloration of the skin in the inferior midline left breast corresponds to a 1.2 cm diameter dermal based mass.  RECOMMENDATION: 1. Recommend ultrasound-guided core needle biopsy of the palpable mass in the upper slightly outer right breast. 2. Recommend continued clinical management and consideration of referral to a dermatologist for the 1.2 cm diameter dermal based mass with associated discoloration of the skin in the inferior midline left breast. 3. Patient is tentatively scheduled for ultrasound-guided core needle biopsy the right breast later today.  I have discussed the findings and recommendations with the patient. Results were also provided  in writing at the conclusion of the visit. If applicable, a reminder letter will be sent to the patient regarding the next appointment.  BI-RADS CATEGORY  5: Highly suggestive of malignancy.   Electronically Signed   By: Andres Shad   On: 12/16/2013 11:00   [place] Rt Breast Bx W Loc Dev 1st Lesion Img Bx Spec US Guide  12/17/2013   ADDENDUM REPORT: 12/17/2013 17:03  ADDENDUM: Pathology for the ultrasound-guided core needle biopsy of the right breast is reported as invasive ductal carcinoma and ductal carcinoma in situ. Please refer to pathology report for further details. The malignant histology is concordant with pre biopsy imaging. Recommend consultation with a breast surgeon. The patient is scheduled for the multidisciplinary Clinic on 12/29/2013 at a.m. These results and recommendations were discussed with the patient by telephone at 4:15 p.m. on 12/17/2013.   Electronically Signed   By: Andres Shad   On: 12/17/2013 17:03   12/17/2013   CLINICAL DATA:  Patient is a 58 year old female with a palpable mass in the upper outer right breast highly suggestive for malignancy.  EXAM: ULTRASOUND GUIDED RIGHT BREAST CORE NEEDLE BIOPSY  COMPARISON:  Previous exams.  PROCEDURE: I met with the patient and we discussed the procedure of ultrasound-guided biopsy, including benefits and alternatives. We discussed the high likelihood of a successful procedure. We discussed the risks of the procedure including infection, bleeding, tissue injury, clip migration, and inadequate sampling. Informed written consent was given. The usual time-out protocol was performed immediately prior to the procedure.  Using sterile technique and 2% Lidocaine as local anesthetic, under direct ultrasound visualization, a 12 gauge vacuum-assisteddevice was used to perform biopsy of hypoechoic irregular palpable mass using a lateral approach. At the conclusion of the procedure, a ribbon shaped tissue marker clip was deployed into biopsied mass.  Follow-up 2-view mammogram was performed and dictated separately.  IMPRESSION: Ultrasound-guided biopsy of a palpable irregular mass in the upper outer right breast. No apparent complications. Pathology is pending.  Electronically Signed: By: Andres Shad On: 12/16/2013 11:51      IMPRESSION: T2N0 Invasive Ductal Carcinoma of the right breast.   PLAN: We discussed the role of radiation and decreasing local failures in patients who undergo lumpectomy. We discussed the retrospective data showing an increase in failure rates in patients who have a pathologic complete response and do not undergo radiation. For this reason I would recommended radiation to the whole breast followed by boost to the tumor bed if she is a breast conservation candidate after neoadjuvant chemotherapy.  She would not require post mastectomy radiation with the knowledge we have now  but of course that would need to be evaluated after chemo for final recommendations. We discussed the process of simulation the placement tattoos. We discussed possible side effects during treatment including but not limited to skin irritation darkness and fatigue. We discussed long-term effects of treatment which are extremely unlikely but possible including damage to the lungs and ribs. We discussed the low likelihood of secondary malignancies. I will see her back for radiation recommendations following her chemotherapy and surgery.    I spent 60 minutes  face to face with the patient and more than 50% of that time was spent in counseling and/or coordination of care.   ------------------------------------------------  Thea Silversmith, MD

## 2013-12-29 NOTE — Progress Notes (Signed)
[place] Psychosocial Distress Screening Clinical Social Work  Patient completed distress screening protocol, and scored a 9 on the Psychosocial Distress Thermometer which indicates severe distress. Clinical Social Worker met with patient and patient's son in [facility] to assess for distress and other psychosocial needs.  Patient stated that after meeting with the treatment team her level of distress had decreased.  Patient reported a history of anxiety and depression.  Patient had been prescribed medication which is managed through her PCP.  CSW and patient discussed the emotional effects of treatment and the importance of emotional support.  Patient stated she has multiple family members that live close to her and are positive support.  Patient also plans to continue working through treatment, but she is aware of the financial advocates and resources available.  CSw informed patient of the support team and support services at [facility].  CSw encouraged patient to call with any questions or concerns.           Clinical Social Worker follow up needed: No.     ONCBCN DISTRESS SCREENING 12/29/2013  Screening Type Initial Screening  Elta [place] the number that describes how much distress you have been experiencing in the past week 9  Practical problem type Housing;Insurance;Work/school;Transportation  Family Problem type Other (comment)  Emotional problem type Depression;Nervousness/Anxiety;Adjusting to illness;Feeling hopeless  Physical Problem type Pain;Nausea/vomiting;Breathing;Changes in urination  Physician notified of physical symptoms Yes  Referral to clinical psychology No  Referral to clinical social work Yes  Referral to dietition No  Referral to financial advocate Yes  Referral to support programs No  Referral to palliative care No  Other death of father in Dec   Zakyra [place], MSW, LCSW, OSW-C Clinical Social Worker [employer] [phone]

## 2013-12-29 NOTE — Progress Notes (Signed)
[place] NOTE  Patient Care Team: Darreld Mclean, MD as PCP - General (Family Medicine) Rulon Eisenmenger, MD as Consulting Physician (Hematology and Oncology) [name], MD as Consulting Physician (General Surgery) Thea Silversmith, MD as Consulting Physician (Radiation Oncology)  CHIEF COMPLAINTS/PURPOSE OF CONSULTATION:  New patient visit for breast cancer  HISTORY OF PRESENTING ILLNESS:  [name] 58 y.o. female is here because of recent diagnosis of right sided invasive breast cancer. She has extensive history of breast cancer diagnoses date 3 she borders and focal bruising on the left breast in since March and chills bilateral breast tenderness she underwent diagnostic mammogram on 12/16/2013 that revealed a palpable 4.1 cm mass with microcalcifications at the 11:00 2 cm from the right nipple along with focal discoloration the left breast 1.2 cm Demal based abnormality she underwent ultrasound on 12/06/2013 that continued to show the 4.1 cm mass in the discoloration in the left breast she underwent ultrasound guided core needle biopsy of the right breast mass that revealed invasive ductal carcinoma along with DCIS ER 100% positive PR 0% and HER-2 was amplified for her to/CEP 17 ratio of 4.28 it was grade 3 she underwent MRI of the breasts that revealed a 4.4 x 3 x 2.5 cm mass along with a subcentimeter satellite lesion. She was presented at the multidisciplinary tumor Board this morning and she see her today to discuss the treatment plan she reports no new concerns other than the anxiety of a diagnosed with breast cancer.  I reviewed her records extensively and collaborated the history with the patient.  SUMMARY OF ONCOLOGIC HISTORY:   Breast cancer of upper-outer quadrant of right female breast   12/16/2013 Initial Biopsy Breast cancer of upper-outer quadrant of right female breast; Invasive Ductal Cancer Er 100%, PR: 0%; Her2 Positive Ratio 4.28: Ki 67:24%;     12/23/2013 Breast MRI Right Breast 4.4 x 3 x 2.5 cm and a sub cm satellite lesion. No LN    In terms of breast cancer risk profile:  She menarched at early age of 31 and went to menopause at age 54  She had one pregnancy, her first child was born at age 58  She has received birth control pills for approximately 5 years.  She was never exposed to fertility medications or hormone replacement therapy.  She has family history of Breast and GI cancer   MEDICAL HISTORY:  Past Medical History  Diagnosis Date  . 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), hypoglycemia 2/04-resolved with PO calcium + D, SVT, was treated with iterferon and ribavirin-10/3-2/04  . GERD (gastroesophageal reflux disease)   . PUD (peptic ulcer disease)   . Fibromyalgia   . Rheumatoid arthritis(714.0)     possible  . SVT (supraventricular tachycardia)   . Depression   . Osteoporosis   . Hepatitis C     Pt was given too much interferon - critical care x2d, 5 days total hosp  . Anxiety     SURGICAL HISTORY: Past Surgical History  Procedure Laterality Date  . Cesarean section  09/17/80  . Dilation and curettage of uterus    . Hysterectomy-for dysmenorrhea  09/17/00  . Nasal sinus surgery  03/20/84    for deviated septum  . Tonsillectomy  05/20/61  . Abdominal hysterectomy      SOCIAL HISTORY: History   Social History  . Marital Status: Divorced    Spouse Name:  N/A    Number of Children: N/A  . Years of Education: N/A   Occupational History  . Not on file.   Social History Main Topics  . Smoking status: Current Every Day Smoker -- 2.00 packs/day for 45 years    Types: Cigarettes  . Smokeless tobacco: Never Used     Comment: 2 ppd since 58 yo   . Alcohol Use: Yes     Comment: denies; reports 3-4 beers/night to Dr. 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 Sexual Activity: Not Currently    Birth Control/ Protection: Surgical   Other Topics Concern  . Not on file   Social History Narrative   Divorced, lives with son Grayland Ormond (born 61); parents nearby. 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    Hepatitis B & C   Has 2 dogs and 5 cats   Did inpatient substance abuse rehab in [place]: Family History  Problem Relation Age of Onset  . Colon cancer Neg Hx   . Cancer Father 68    lung  . Dementia Father   . Dementia Mother   . Hypertension Mother   . Hyperlipidemia Mother   . Breast cancer Maternal Aunt   . Cancer Paternal Uncle   . Breast cancer Maternal Aunt   . Breast cancer Maternal Aunt     ALLERGIES:  is allergic to codeine.  MEDICATIONS:  Current Outpatient Prescriptions  Medication Sig Dispense Refill  . ALPRAZolam (XANAX) 0.5 MG tablet TAKE 1 TABLET BY MOUTH TWICE A DAY AS NEEDED  60 tablet  5  . atenolol (TENORMIN) 50 MG tablet TAKE 1 TABLET BY MOUTH 2 TIMES A DAY  60 tablet  12  . Multiple Vitamin (MULTIVITAMIN) tablet Take 1 tablet by mouth daily.        . naproxen sodium (ANAPROX) 220 MG tablet Take 220 mg by mouth 2 (two) times daily with a meal.      . omeprazole (PRILOSEC) 20 MG capsule Take 20 mg by mouth daily.      . sertraline (ZOLOFT) 50 MG tablet Take 1 tablet (50 mg total) by mouth daily.  30 tablet  5   No current facility-administered medications for this visit.    REVIEW OF SYSTEMS:   Constitutional: Denies fevers, chills or abnormal night sweats Eyes: Denies blurriness of vision, double vision or watery eyes Ears, nose, mouth, throat, and face: Denies mucositis or sore throat Respiratory: Denies cough, dyspnea or wheezes Cardiovascular: Denies palpitation, chest discomfort or lower extremity swelling Gastrointestinal:  Denies nausea, heartburn  or change in bowel habits Skin: Denies abnormal skin rashes Lymphatics: Denies new lymphadenopathy or easy bruising Neurological:Denies numbness, tingling or new weaknesses Behavioral/Psych: Mood is stable, no new changes  Breasts: Tender and nodule in the right breast All other systems were reviewed with the patient and are negative.  PHYSICAL EXAMINATION: ECOG PERFORMANCE STATUS: 0 - Asymptomatic  Filed Vitals:   12/29/13 0831  BP: 162/88  Pulse: 65  Temp: 98 F (36.7 C)  Resp: 20   Filed Weights   12/29/13 0831  Weight: 174 lb 9.6 oz (79.198 kg)    GENERAL:alert, no distress and comfortable SKIN: skin color, texture, turgor are  normal, no rashes or significant lesions EYES: normal, conjunctiva are pink and non-injected, sclera clear OROPHARYNX:no exudate, no erythema and lips, buccal mucosa, and tongue normal  NECK: supple, thyroid normal size, non-tender, without nodularity LYMPH:  no palpable lymphadenopathy in the cervical, axillary or inguinal LUNGS: clear to auscultation and percussion with normal breathing effort HEART: regular rate & rhythm and no murmurs and no lower extremity edema ABDOMEN:abdomen soft, non-tender and normal bowel sounds Musculoskeletal:no cyanosis of digits and no clubbing  PSYCH: alert & oriented x 3 with fluent speech NEURO: no focal motor/sensory deficits BREAST: Right breast palpable abnormality is felt in the upper outer quadrant at least 4-5 cm in size no palpable lymphadenopathy no abnormalities felt in the left breast it appears to be a small bruise  LABORATORY DATA:  I have reviewed the data as listed Lab Results  Component Value Date   WBC 9.6 12/29/2013   HGB 14.8 12/29/2013   HCT 45.0 12/29/2013   MCV 89.8 12/29/2013   PLT 243 12/29/2013   Lab Results  Component Value Date   NA 139 12/29/2013   K 3.9 12/29/2013   CL 104 04/03/2013   CO2 21* 12/29/2013    RADIOGRAPHIC STUDIES: I have personally reviewed the radiological reports  and agreed with the findings in the report.   ASSESSMENT AND PLAN:  Breast cancer of upper-outer quadrant of right female breast 1. 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  Discussed with her extensively about her pathology report including the receptors and their significance based on the chewable discussion we recommend neoadjuvant chemotherapy with Taxotere carboplatin Herceptin and Perclose amount given once every 3 weeks for 6 cycles followed by restaging MRI of the breasts followed by surgery. Menarche complete staging I would like to get a PET/CT scan.  We will obtain consultation from surgery to place a port. we'll also get an echocardiogram (pre-herceptin) and establish her for chemotherapy education class. Genetics consultation would also be obtained in her extensive family history of multiple cancers including breast cancer.  Discussed healthcare power of attorney: she will be provided paperwork to fill that.  2. Goals of chemotherapy: cure assuming PET/CT is negative for distant metastatic disease 3. chemotherapy counseling: I discussed the risks and benefits of chemotherapy including the risk of allergy or anaphylaxis risk of infection fatigue bruising from low blood counts risk of nausea vomiting cardiac toxicities related to Herceptin [place] and she appears to understand these risks and benefits and is willing to proceed with the plan.   All questions were answered. The patient knows to call the clinic with any problems, questions or concerns. I spent 40 minutes counseling the patient face to face. The total time spent in the appointment was 60 minutes and more than 50% was on counseling.     Rulon Eisenmenger, MD 12/29/2013 11:01 AM

## 2014-01-02 ENCOUNTER — Telehealth: Payer: Self-pay

## 2014-01-02 NOTE — Telephone Encounter (Signed)
PATIENT STATES SHE CALLED LAST WEEK REGARDING HER XANAX. SOMEONE CALLED AND LEFT HER A MESSAGE, BUT SHE THINKS THE MESSAGE WAS NOT TAKEN CORRECTLY. SHE WAS NOT ASKING FOR HER XANAX TO BE CHANGED. SHE WANTS TO KNOW IF IT CAN BE FILLED ON MON. OR TUES. INSTEAD OF ON Friday? SHE WAS DIAGNOSED WITH CANCER AND HAS A LOT OF ANXIETY. SHE IS WORRIED ABOUT HER CANCER TREATMENTS, HER JOB AND NOT HAVING ANY MONEY. SHE IS TAKING A FEW MORE XANAX THAN SHE NORMALLY WOULD. SHE CAN COME INTO THE OFFICE NEXT MONTH IF NEED BE. (XANAX 0.5 MG) BEST PHONE [phone] (HOME)  House ([place])  [place]

## 2014-01-03 ENCOUNTER — Telehealth: Payer: Self-pay | Admitting: *Deleted

## 2014-01-03 MED ORDER — ALPRAZOLAM 0.5 MG PO TABS
0.5000 mg | ORAL_TABLET | Freq: Two times a day (BID) | ORAL | Status: DC | PRN
Start: 2014-01-03 — End: 2014-02-26

## 2014-01-03 NOTE — Telephone Encounter (Signed)
Left vm for pt to return call regarding [place] from 12/29/13. Contact information given.

## 2014-01-03 NOTE — Telephone Encounter (Signed)
Pt would like a refill on xanax not an increase Please advise

## 2014-01-03 NOTE — Telephone Encounter (Signed)
Faxed and pt notified on VM.

## 2014-01-04 ENCOUNTER — Ambulatory Visit
Admission: RE | Admit: 2014-01-04 | Discharge: 2014-01-04 | Disposition: A | Discharge status: Home / Self Care | Payer: No Typology Code available for payment source | Source: Ambulatory Visit | Attending: General Surgery | Admitting: General Surgery

## 2014-01-04 ENCOUNTER — Encounter: Payer: Self-pay | Admitting: *Deleted

## 2014-01-04 DIAGNOSIS — R928 Other abnormal and inconclusive findings on diagnostic imaging of breast: Secondary | ICD-10-CM

## 2014-01-04 MED ORDER — GADOBENATE DIMEGLUMINE 529 MG/ML IV SOLN
15.0000 mL | Freq: Once | INTRAVENOUS | Status: AC | PRN
  Administered 2014-01-04: 15 mL via INTRAVENOUS

## 2014-01-04 NOTE — Progress Notes (Signed)
Spoke to pt concerning [place] from 12/29/13. Pt denies questions regarding dx or treatment care plan. Confirmed future appts. Pt relate she had to delay having port d/t financial responsibilities. Informed pt we will r/s her f/u with Dr. Lindi Adie and 1st chemo and will call with new appt times. Encourage pt to call with further needs. Received verbal understanding. Contact information given.

## 2014-01-05 ENCOUNTER — Telehealth: Payer: Self-pay | Admitting: *Deleted

## 2014-01-05 ENCOUNTER — Other Ambulatory Visit: Payer: No Typology Code available for payment source

## 2014-01-05 NOTE — Telephone Encounter (Signed)
Per staff message and POF I have scheduled appts. Advised scheduler of appts. JMW  

## 2014-01-06 ENCOUNTER — Ambulatory Visit (HOSPITAL_COMMUNITY)
Admission: RE | Admit: 2014-01-06 | Discharge: 2014-01-06 | Disposition: A | Discharge status: Home / Self Care | Payer: Medicaid Other | Source: Ambulatory Visit | Attending: Hematology and Oncology | Admitting: Hematology and Oncology

## 2014-01-06 ENCOUNTER — Encounter: Payer: Self-pay | Admitting: *Deleted

## 2014-01-06 ENCOUNTER — Ambulatory Visit (HOSPITAL_COMMUNITY)
Admission: RE | Admit: 2014-01-06 | Discharge: 2014-01-06 | Disposition: A | Discharge status: Home / Self Care | Payer: No Typology Code available for payment source | Source: Ambulatory Visit | Attending: Hematology and Oncology | Admitting: Hematology and Oncology

## 2014-01-06 ENCOUNTER — Other Ambulatory Visit: Payer: No Typology Code available for payment source

## 2014-01-06 DIAGNOSIS — I517 Cardiomegaly: Secondary | ICD-10-CM

## 2014-01-06 DIAGNOSIS — C50411 Malignant neoplasm of upper-outer quadrant of right female breast: Secondary | ICD-10-CM

## 2014-01-06 DIAGNOSIS — I471 Supraventricular tachycardia, unspecified: Secondary | ICD-10-CM | POA: Insufficient documentation

## 2014-01-06 DIAGNOSIS — Z5111 Encounter for antineoplastic chemotherapy: Secondary | ICD-10-CM | POA: Insufficient documentation

## 2014-01-06 DIAGNOSIS — C50419 Malignant neoplasm of upper-outer quadrant of unspecified female breast: Secondary | ICD-10-CM | POA: Insufficient documentation

## 2014-01-06 LAB — GLUCOSE, CAPILLARY: Glucose-Capillary: 110 mg/dL — ABNORMAL HIGH (ref 70–99)

## 2014-01-06 MED ORDER — FLUDEOXYGLUCOSE F - 18 (FDG) INJECTION
10.1000 | Freq: Once | INTRAVENOUS | Status: AC | PRN
  Administered 2014-01-06: 10.1 via INTRAVENOUS

## 2014-01-06 NOTE — Progress Notes (Signed)
Echocardiogram 2D Echocardiogram has been performed.  [name] 01/06/2014, 11:07 AM

## 2014-01-07 ENCOUNTER — Telehealth: Payer: Self-pay

## 2014-01-07 ENCOUNTER — Telehealth: Payer: Self-pay | Admitting: *Deleted

## 2014-01-07 NOTE — Telephone Encounter (Signed)
Per staff message from Desk RN I have moved appt from 9/9 to 9/11

## 2014-01-07 NOTE — Telephone Encounter (Signed)
Returned pt to call to reschedule 1st chemo.  Moved chemo from 9/9 to 9/11.  Moved lab/MD from 9/8 to 9/10.  Confirmed genetics appt on 8/26.  Cxl appt on 9/17, reschedule for 10/2.    POF sent

## 2014-01-11 ENCOUNTER — Encounter: Payer: Self-pay | Admitting: *Deleted

## 2014-01-11 ENCOUNTER — Telehealth: Payer: Self-pay | Admitting: Hematology and Oncology

## 2014-01-11 NOTE — Telephone Encounter (Signed)
, °

## 2014-01-11 NOTE — Progress Notes (Signed)
Per Dr. Lindi Adie, he called patient and left message with PET/echo results. Patient to call clinic with any questions.

## 2014-01-12 ENCOUNTER — Ambulatory Visit: Payer: No Typology Code available for payment source | Admitting: Hematology and Oncology

## 2014-01-12 ENCOUNTER — Telehealth: Payer: Self-pay | Admitting: *Deleted

## 2014-01-12 ENCOUNTER — Other Ambulatory Visit: Payer: No Typology Code available for payment source

## 2014-01-12 NOTE — Telephone Encounter (Signed)
Patient called to request times of lab/MD appts. Called and LMOVM

## 2014-01-13 ENCOUNTER — Ambulatory Visit: Payer: No Typology Code available for payment source

## 2014-01-14 ENCOUNTER — Encounter (HOSPITAL_BASED_OUTPATIENT_CLINIC_OR_DEPARTMENT_OTHER): Payer: Self-pay | Admitting: *Deleted

## 2014-01-14 NOTE — Progress Notes (Signed)
Labs done cc-cbc cmet-12/29/13-echo-01/06/14,ekg 2/15 Smokes and drinks heavily

## 2014-01-20 ENCOUNTER — Ambulatory Visit (HOSPITAL_COMMUNITY): Payer: Medicaid Other

## 2014-01-20 ENCOUNTER — Encounter (HOSPITAL_BASED_OUTPATIENT_CLINIC_OR_DEPARTMENT_OTHER): Payer: Self-pay | Admitting: Anesthesiology

## 2014-01-20 ENCOUNTER — Ambulatory Visit (HOSPITAL_BASED_OUTPATIENT_CLINIC_OR_DEPARTMENT_OTHER)
Admission: RE | Admit: 2014-01-20 | Discharge: 2014-01-20 | Disposition: A | Discharge status: Home / Self Care | Payer: No Typology Code available for payment source | Source: Ambulatory Visit | Attending: General Surgery | Admitting: General Surgery

## 2014-01-20 ENCOUNTER — Encounter (HOSPITAL_BASED_OUTPATIENT_CLINIC_OR_DEPARTMENT_OTHER)
Admission: RE | Disposition: A | Discharge status: Home / Self Care | Payer: Self-pay | Source: Ambulatory Visit | Attending: General Surgery

## 2014-01-20 ENCOUNTER — Ambulatory Visit (HOSPITAL_BASED_OUTPATIENT_CLINIC_OR_DEPARTMENT_OTHER): Payer: Medicaid Other | Admitting: Anesthesiology

## 2014-01-20 DIAGNOSIS — IMO0001 Reserved for inherently not codable concepts without codable children: Secondary | ICD-10-CM | POA: Insufficient documentation

## 2014-01-20 DIAGNOSIS — K219 Gastro-esophageal reflux disease without esophagitis: Secondary | ICD-10-CM | POA: Insufficient documentation

## 2014-01-20 DIAGNOSIS — Z780 Asymptomatic menopausal state: Secondary | ICD-10-CM | POA: Insufficient documentation

## 2014-01-20 DIAGNOSIS — K279 Peptic ulcer, site unspecified, unspecified as acute or chronic, without hemorrhage or perforation: Secondary | ICD-10-CM | POA: Insufficient documentation

## 2014-01-20 DIAGNOSIS — M810 Age-related osteoporosis without current pathological fracture: Secondary | ICD-10-CM | POA: Insufficient documentation

## 2014-01-20 DIAGNOSIS — F17200 Nicotine dependence, unspecified, uncomplicated: Secondary | ICD-10-CM | POA: Insufficient documentation

## 2014-01-20 DIAGNOSIS — C50419 Malignant neoplasm of upper-outer quadrant of unspecified female breast: Secondary | ICD-10-CM | POA: Insufficient documentation

## 2014-01-20 DIAGNOSIS — Z801 Family history of malignant neoplasm of trachea, bronchus and lung: Secondary | ICD-10-CM | POA: Insufficient documentation

## 2014-01-20 DIAGNOSIS — Z8619 Personal history of other infectious and parasitic diseases: Secondary | ICD-10-CM | POA: Insufficient documentation

## 2014-01-20 DIAGNOSIS — I498 Other specified cardiac arrhythmias: Secondary | ICD-10-CM | POA: Insufficient documentation

## 2014-01-20 DIAGNOSIS — B1920 Unspecified viral hepatitis C without hepatic coma: Secondary | ICD-10-CM | POA: Insufficient documentation

## 2014-01-20 DIAGNOSIS — Z803 Family history of malignant neoplasm of breast: Secondary | ICD-10-CM | POA: Insufficient documentation

## 2014-01-20 DIAGNOSIS — Z920 Personal history of contraception: Secondary | ICD-10-CM | POA: Insufficient documentation

## 2014-01-20 DIAGNOSIS — F341 Dysthymic disorder: Secondary | ICD-10-CM | POA: Insufficient documentation

## 2014-01-20 HISTORY — DX: Presence of spectacles and contact lenses: Z97.3

## 2014-01-20 HISTORY — DX: Nicotine dependence, unspecified, uncomplicated: F17.200

## 2014-01-20 HISTORY — PX: PORTACATH PLACEMENT: SHX2246

## 2014-01-20 LAB — POCT HEMOGLOBIN-HEMACUE: Hemoglobin: 16.2 g/dL — ABNORMAL HIGH (ref 12.0–15.0)

## 2014-01-20 SURGERY — INSERTION, TUNNELED CENTRAL VENOUS DEVICE, WITH PORT
Anesthesia: General | Site: Chest | Laterality: Left

## 2014-01-20 MED ORDER — SODIUM CHLORIDE 0.9 % IV SOLN: 250.0000 mL | INTRAVENOUS | Status: DC | PRN

## 2014-01-20 MED ORDER — SUCCINYLCHOLINE CHLORIDE 20 MG/ML IJ SOLN
INTRAMUSCULAR | Status: DC | PRN
  Administered 2014-01-20: 100 mg via INTRAVENOUS

## 2014-01-20 MED ORDER — MEPERIDINE HCL 25 MG/ML IJ SOLN: 6.2500 mg | INTRAMUSCULAR | Status: DC | PRN

## 2014-01-20 MED ORDER — SODIUM CHLORIDE 0.9 % IJ SOLN: 3.0000 mL | Freq: Two times a day (BID) | INTRAMUSCULAR | Status: DC

## 2014-01-20 MED ORDER — BUPIVACAINE-EPINEPHRINE 0.5% -1:200000 IJ SOLN
INTRAMUSCULAR | Status: DC | PRN
  Administered 2014-01-20: 10 mL

## 2014-01-20 MED ORDER — ONDANSETRON HCL 4 MG/2ML IJ SOLN: 4.0000 mg | Freq: Once | INTRAMUSCULAR | Status: DC | PRN

## 2014-01-20 MED ORDER — FENTANYL CITRATE 0.05 MG/ML IJ SOLN
INTRAMUSCULAR | Status: DC | PRN
  Administered 2014-01-20 (×2): 50 ug via INTRAVENOUS

## 2014-01-20 MED ORDER — CEFAZOLIN SODIUM-DEXTROSE 2-3 GM-% IV SOLR
2.0000 g | INTRAVENOUS | Status: AC
  Administered 2014-01-20: 2 g via INTRAVENOUS

## 2014-01-20 MED ORDER — LIDOCAINE HCL (CARDIAC) 20 MG/ML IV SOLN
INTRAVENOUS | Status: DC | PRN
  Administered 2014-01-20: 100 mg via INTRAVENOUS

## 2014-01-20 MED ORDER — FENTANYL CITRATE 0.05 MG/ML IJ SOLN
INTRAMUSCULAR | Status: AC
  Filled 2014-01-20: qty 4

## 2014-01-20 MED ORDER — HYDROMORPHONE HCL PF 1 MG/ML IJ SOLN: 0.2500 mg | INTRAMUSCULAR | Status: DC | PRN

## 2014-01-20 MED ORDER — FENTANYL CITRATE 0.05 MG/ML IJ SOLN
50.0000 ug | INTRAMUSCULAR | Status: DC | PRN
Start: 2014-01-20 — End: 2014-01-20

## 2014-01-20 MED ORDER — MIDAZOLAM HCL 2 MG/2ML IJ SOLN: 1.0000 mg | INTRAMUSCULAR | Status: DC | PRN

## 2014-01-20 MED ORDER — HEPARIN SOD (PORK) LOCK FLUSH 100 UNIT/ML IV SOLN
INTRAVENOUS | Status: AC
  Filled 2014-01-20: qty 5

## 2014-01-20 MED ORDER — MIDAZOLAM HCL 5 MG/5ML IJ SOLN
INTRAMUSCULAR | Status: DC | PRN
  Administered 2014-01-20: 2 mg via INTRAVENOUS

## 2014-01-20 MED ORDER — SODIUM CHLORIDE 0.9 % IJ SOLN: 3.0000 mL | INTRAMUSCULAR | Status: DC | PRN

## 2014-01-20 MED ORDER — OXYCODONE HCL 5 MG PO TABS: 5.0000 mg | ORAL_TABLET | ORAL | Status: DC | PRN

## 2014-01-20 MED ORDER — HEPARIN SOD (PORK) LOCK FLUSH 100 UNIT/ML IV SOLN
INTRAVENOUS | Status: DC | PRN
  Administered 2014-01-20: 500 [IU] via INTRAVENOUS

## 2014-01-20 MED ORDER — DEXAMETHASONE SODIUM PHOSPHATE 4 MG/ML IJ SOLN
INTRAMUSCULAR | Status: DC | PRN
  Administered 2014-01-20: 8 mg via INTRAVENOUS

## 2014-01-20 MED ORDER — MIDAZOLAM HCL 2 MG/2ML IJ SOLN
INTRAMUSCULAR | Status: AC
  Filled 2014-01-20: qty 2

## 2014-01-20 MED ORDER — OXYCODONE HCL 5 MG PO TABS
5.0000 mg | ORAL_TABLET | Freq: Once | ORAL | Status: AC | PRN
  Administered 2014-01-20: 5 mg via ORAL

## 2014-01-20 MED ORDER — LACTATED RINGERS IV SOLN
INTRAVENOUS | Status: DC
  Administered 2014-01-20: 11:00:00 via INTRAVENOUS

## 2014-01-20 MED ORDER — ACETAMINOPHEN 325 MG PO TABS: 650.0000 mg | ORAL_TABLET | ORAL | Status: DC | PRN

## 2014-01-20 MED ORDER — ACETAMINOPHEN 650 MG RE SUPP: 650.0000 mg | RECTAL | Status: DC | PRN

## 2014-01-20 MED ORDER — PROPOFOL 10 MG/ML IV BOLUS
INTRAVENOUS | Status: DC | PRN
  Administered 2014-01-20: 150 mg via INTRAVENOUS

## 2014-01-20 MED ORDER — CEFAZOLIN SODIUM-DEXTROSE 2-3 GM-% IV SOLR
INTRAVENOUS | Status: AC
  Filled 2014-01-20: qty 50

## 2014-01-20 MED ORDER — OXYCODONE HCL 5 MG PO TABS
ORAL_TABLET | ORAL | Status: AC
  Filled 2014-01-20: qty 1

## 2014-01-20 MED ORDER — HEPARIN (PORCINE) IN NACL 2-0.9 UNIT/ML-% IJ SOLN
INTRAMUSCULAR | Status: DC | PRN
  Administered 2014-01-20: 1 via INTRAVENOUS

## 2014-01-20 MED ORDER — ONDANSETRON HCL 4 MG/2ML IJ SOLN
INTRAMUSCULAR | Status: DC | PRN
  Administered 2014-01-20: 4 mg via INTRAVENOUS

## 2014-01-20 MED ORDER — OXYCODONE-ACETAMINOPHEN 5-325 MG PO TABS: 1.0000 | ORAL_TABLET | ORAL | Status: DC | PRN

## 2014-01-20 MED ORDER — OXYCODONE HCL 5 MG/5ML PO SOLN: 5.0000 mg | Freq: Once | ORAL | Status: AC | PRN

## 2014-01-20 SURGICAL SUPPLY — 44 items
ADH SKN CLS APL DERMABOND .7 (GAUZE/BANDAGES/DRESSINGS) ×1
BAG DECANTER FOR FLEXI CONT (MISCELLANEOUS) ×3 IMPLANT
BLADE HEX COATED 2.75 (ELECTRODE) ×3 IMPLANT
BLADE SURG 11 STRL SS (BLADE) ×3 IMPLANT
BLADE SURG 15 STRL LF DISP TIS (BLADE) ×1 IMPLANT
BLADE SURG 15 STRL SS (BLADE) ×3
CHLORAPREP W/TINT 26ML (MISCELLANEOUS) ×3 IMPLANT
COVER MAYO STAND STRL (DRAPES) ×3 IMPLANT
COVER TABLE BACK 60X90 (DRAPES) ×3 IMPLANT
DECANTER SPIKE VIAL GLASS SM (MISCELLANEOUS) IMPLANT
DERMABOND ADVANCED (GAUZE/BANDAGES/DRESSINGS) ×2
DERMABOND ADVANCED .7 DNX12 (GAUZE/BANDAGES/DRESSINGS) ×1 IMPLANT
DRAPE C-ARM 42X72 X-RAY (DRAPES) ×3 IMPLANT
DRAPE LAPAROTOMY TRNSV 102X78 (DRAPE) ×3 IMPLANT
DRAPE UTILITY XL STRL (DRAPES) ×3 IMPLANT
DRSG TEGADERM 4X4.75 (GAUZE/BANDAGES/DRESSINGS) IMPLANT
ELECT REM PT RETURN 9FT ADLT (ELECTROSURGICAL) ×3
ELECTRODE REM PT RTRN 9FT ADLT (ELECTROSURGICAL) ×1 IMPLANT
GLOVE BIO SURGEON STRL SZ 6 (GLOVE) ×3 IMPLANT
GLOVE BIOGEL PI IND STRL 6.5 (GLOVE) ×1 IMPLANT
GLOVE BIOGEL PI IND STRL 7.0 (GLOVE) IMPLANT
GLOVE BIOGEL PI INDICATOR 6.5 (GLOVE) ×2
GLOVE BIOGEL PI INDICATOR 7.0 (GLOVE) ×2
GLOVE ECLIPSE 7.0 STRL STRAW (GLOVE) ×2 IMPLANT
GOWN STRL REUS W/ TWL LRG LVL3 (GOWN DISPOSABLE) ×1 IMPLANT
GOWN STRL REUS W/TWL 2XL LVL3 (GOWN DISPOSABLE) ×3 IMPLANT
GOWN STRL REUS W/TWL LRG LVL3 (GOWN DISPOSABLE) ×3
KIT PORT POWER 8FR ISP CVUE (Catheter) ×2 IMPLANT
NDL HYPO 25X1 1.5 SAFETY (NEEDLE) ×1 IMPLANT
NEEDLE HYPO 25X1 1.5 SAFETY (NEEDLE) ×3 IMPLANT
PACK BASIN DAY SURGERY FS (CUSTOM PROCEDURE TRAY) ×3 IMPLANT
PENCIL BUTTON HOLSTER BLD 10FT (ELECTRODE) ×3 IMPLANT
SLEEVE SCD COMPRESS KNEE MED (MISCELLANEOUS) ×3 IMPLANT
SPONGE GAUZE 4X4 12PLY STER LF (GAUZE/BANDAGES/DRESSINGS) IMPLANT
SUT MNCRL AB 4-0 PS2 18 (SUTURE) ×3 IMPLANT
SUT PROLENE 2 0 SH DA (SUTURE) ×6 IMPLANT
SUT VIC AB 3-0 SH 27 (SUTURE) ×3
SUT VIC AB 3-0 SH 27X BRD (SUTURE) ×1 IMPLANT
SUT VICRYL 3-0 CR8 SH (SUTURE) IMPLANT
SYR 5ML LUER SLIP (SYRINGE) ×3 IMPLANT
SYR CONTROL 10ML LL (SYRINGE) ×3 IMPLANT
SYRINGE 10CC LL (SYRINGE) ×1 IMPLANT
TOWEL OR 17X24 6PK STRL BLUE (TOWEL DISPOSABLE) ×3 IMPLANT
TOWEL OR NON WOVEN STRL DISP B (DISPOSABLE) ×3 IMPLANT

## 2014-01-20 NOTE — Anesthesia Preprocedure Evaluation (Signed)
Anesthesia Evaluation  Patient identified by MRN, date of birth, ID band Patient awake    Reviewed: Allergy & Precautions, H&P , NPO status , Patient's Chart, lab work & pertinent test results  Airway Mallampati: I TM Distance: >3 FB Neck ROM: Full    Dental   Pulmonary Current Smoker,          Cardiovascular     Neuro/Psych Anxiety Depression    GI/Hepatic GERD-  Medicated and Controlled,(+) Hepatitis -, C  Endo/Other    Renal/GU      Musculoskeletal   Abdominal   Peds  Hematology   Anesthesia Other Findings   Reproductive/Obstetrics                           Anesthesia Physical Anesthesia Plan  ASA: III  Anesthesia Plan: General   Post-op Pain Management:    Induction: Intravenous  Airway Management Planned: LMA  Additional Equipment:   Intra-op Plan:   Post-operative Plan: Extubation in OR  Informed Consent: I have reviewed the patients History and Physical, chart, labs and discussed the procedure including the risks, benefits and alternatives for the proposed anesthesia with the patient or authorized representative who has indicated his/her understanding and acceptance.     Plan Discussed with: CRNA and Surgeon  Anesthesia Plan Comments:         Anesthesia Quick Evaluation

## 2014-01-20 NOTE — Anesthesia Procedure Notes (Signed)
Procedure Name: Intubation Date/Time: 01/20/2014 11:05 AM Performed by: Lyndee Leo Pre-anesthesia Checklist: Patient identified, Emergency Drugs available, Suction available and Patient being monitored Patient Re-evaluated:Patient Re-evaluated prior to inductionOxygen Delivery Method: Circle System Utilized Preoxygenation: Pre-oxygenation with 100% oxygen Intubation Type: IV induction Ventilation: Mask ventilation without difficulty Laryngoscope Size: Mac and 3 Grade View: Grade IV Tube type: Oral Number of attempts: 1 Airway Equipment and Method: stylet and oral airway Placement Confirmation: ETT inserted through vocal cords under direct vision,  positive ETCO2 and breath sounds checked- equal and bilateral Secured at: 21 cm Tube secured with: Tape Dental Injury: Teeth and Oropharynx as per pre-operative assessment

## 2014-01-20 NOTE — Anesthesia Postprocedure Evaluation (Signed)
Anesthesia Post Note  Patient: [name]  Procedure(s) Performed: Procedure(s) (LRB): INSERTION PORT-A-CATH (Left)  Anesthesia type: general  Patient location: PACU  Post pain: Pain level controlled  Post assessment: Patient's Cardiovascular Status Stable  Last Vitals:  Filed Vitals:   01/20/14 1147  BP: 125/99  Pulse: 84  Temp:   Resp: 15    Post vital signs: Reviewed and stable  Level of consciousness: sedated  Complications: No apparent anesthesia complications

## 2014-01-20 NOTE — Op Note (Signed)
PREOPERATIVE DIAGNOSIS:  Right breast cancer     POSTOPERATIVE DIAGNOSIS:  Same     PROCEDURE: Right subclavian port placement, Bard ClearVue  Power Port, MRI safe, 8-French.      SURGEON:  [name], MD      ANESTHESIA:  General   FINDINGS:  Good venous return, easy flush, and tip of the catheter and   SVC 23.5 cm.      SPECIMEN:  None.      ESTIMATED BLOOD LOSS:  Minimal.      COMPLICATIONS:  None known.      PROCEDURE:  Pt was identified in the holding area and taken to   the operating room, where patient was placed supine on the operating room   table.  General LMA anesthesia was induced.  Patient's arms were tucked and the upper   chest and neck were prepped and draped in sterile fashion.  Time-out was   performed according to the surgical safety check list.  When all was   correct, we continued.   Local anesthetic was administered over this   area at the angle of the clavicle.  The vein was accessed with 1 pass of the needle. There was good venous return and the wire passed easily with no ectopy.   Fluoroscopy was used to confirm that the wire was in the vena cava.      The patient was placed back level and the area for the pocket was anethetized   with local anesthetic.  A 3-cm transverse incision was made with a #15   blade.  Cautery was used to divide the subcutaneous tissues down to the   pectoralis muscle.  An [employer] retractor was used to elevate the skin   while a pocket was created on top of the pectoralis fascia.  The port   was placed into the pocket to confirm that it was of adequate size.  The   catheter was preattached to the port.  The port was then secured to the   pectoralis fascia with four 2-0 Prolene sutures.  These were clamped and   not tied down yet.    The catheter was tunneled through to the wire exit   site.  The catheter was placed along the wire to determine what length it should be to be in the SVC.  The catheter was cut at 23.5 cm.  The  tunneler sheath and dilator were passed over the wire and the dilator and wire were removed.  The catheter was advanced through the tunneler sheath and the tunneler sheath was pulled away.  Care was taken to keep the catheter in the tunneler sheath as this occurred. This was advanced and the tunneler sheath was removed.  There was good venous   return and easy flush of the catheter.  The Prolene sutures were tied   down to the pectoral fascia.  The skin was reapproximated using 3-0   Vicryl interrupted deep dermal sutures.    Fluoroscopy was used to re-confirm good position of the catheter.  The skin   was then closed using 4-0 Monocryl in a subcuticular fashion.  The port was flushed with concentrated heparin flush as well.  The wounds were then cleaned, dried, and dressed with Dermabond.  The patient was awakened from anesthesia and taken to the PACU in stable condition.  Needle, sponge, and instrument counts were correct.               [name], MD

## 2014-01-20 NOTE — Transfer of Care (Signed)
Immediate Anesthesia Transfer of Care Note  Patient: [name]  Procedure(s) Performed: Procedure(s): INSERTION PORT-A-CATH (Left)  Patient Location: PACU  Anesthesia Type:General  Level of Consciousness: awake, oriented and patient cooperative  Airway & Oxygen Therapy: Patient Spontanous Breathing and Patient connected to face mask oxygen  Post-op Assessment: Report given to PACU RN and Post -op Vital signs reviewed and stable  Post vital signs: Reviewed and stable  Complications: No apparent anesthesia complications

## 2014-01-20 NOTE — H&P (View-Only) (Signed)
Chief complaint:  Right breast cancer  Referring MD: Edilia Bo, MD  HISTORY:  Pt is a 58 yo F who presents with a new diagnosis of right breast cancer.  She is here for consultation at the request of Dr. Edilia Bo.  She presented with new bruising and soreness of the left breast.  She was found to have a palpable mass and asymmetry on the right on imaging.  The left breast was negative for any findings.  She did not palpate the mass prior to this.  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  Ki67 is 24%.  MRI demonstrated this finding with a satellite nodule inferior 1.5 cm to the main mass and calcifications extending to nipple.  She has a history of possible RA and fibromyalgia.  She was on many chronic pain meds, but she discontinued them due to the side effects of sedation and loss of concentration.   She has a family history of father with lung cancer, paternal cousin with breast cancer, paternal uncle with lung cancer, 2 maternal aunts with breast cancer, and a maternal aunt with stomach cancer.  She had menarche at age 77, and had stopped having periods in 2002.  She had one child at age 42.  She did use hormonal contraception for around 10 years.  She is up to date on her colonoscopy, bone density, and pap smear.    Past Medical History  Diagnosis Date  . 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), hypoglycemia 2/04-resolved with PO calcium + D, SVT, was treated with iterferon and ribavirin-10/3-2/04  . GERD (gastroesophageal reflux disease)   . PUD (peptic ulcer disease)   . Fibromyalgia   . Rheumatoid arthritis(714.0)     possible  . SVT (supraventricular tachycardia)   . Depression   . Osteoporosis   . Hepatitis C     Pt was given too much interferon - critical care x2d, 5 days total hosp  . Anxiety     Past Surgical History  Procedure Laterality  Date  . Cesarean section  09/17/80  . Dilation and curettage of uterus    . Hysterectomy-for dysmenorrhea  09/17/00  . Nasal sinus surgery  03/20/84    for deviated septum  . Tonsillectomy  05/20/61  . Abdominal hysterectomy      Current Outpatient Prescriptions  Medication Sig Dispense Refill  . ALPRAZolam (XANAX) 0.5 MG tablet TAKE 1 TABLET BY MOUTH TWICE A DAY AS NEEDED  60 tablet  5  . atenolol (TENORMIN) 50 MG tablet TAKE 1 TABLET BY MOUTH 2 TIMES A DAY  60 tablet  12  . Multiple Vitamin (MULTIVITAMIN) tablet Take 1 tablet by mouth daily.        . naproxen sodium (ANAPROX) 220 MG tablet Take 220 mg by mouth 2 (two) times daily with a meal.      . omeprazole (PRILOSEC) 20 MG capsule Take 20 mg by mouth daily.      . sertraline (ZOLOFT) 50 MG tablet Take 1 tablet (50 mg total) by mouth daily.  30 tablet  5   No current facility-administered medications for this visit.     Allergies  Allergen Reactions  . Codeine Itching and Nausea Only    Pt has had codeine with pre-administration to prevent reaction without any problem.      Family History  Problem Relation Age of Onset  .  Colon cancer Neg Hx   . Cancer Father 47    lung  . Dementia Father   . Dementia Mother   . Hypertension Mother   . Hyperlipidemia Mother   . Breast cancer Maternal Aunt   . Cancer Paternal Uncle   . Breast cancer Maternal Aunt   . Breast cancer Maternal Aunt      History   Social History  . Marital Status: Divorced    Spouse Name: N/A    Number of Children: N/A  . Years of Education: N/A   Social History Main Topics  . Smoking status: Current Every Day Smoker -- 2.00 packs/day for 45 years    Types: Cigarettes  . Smokeless tobacco: Never Used     Comment: 2 ppd since 58 yo   . Alcohol Use: Yes     Comment: denies; reports 3-4 beers/night to Dr. 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 Sexual Activity: Not Currently    Birth Control/ Protection: Surgical   Other Topics Concern  . Not on file   Social History Narrative   Divorced, lives with son Grayland Ormond (born 9); parents nearby. 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    Hepatitis B & C   Has 2 dogs and 5 cats   Did inpatient substance abuse rehab in [place]: 12 point review of systems negative other than HPI and PMH except for fatigue, palpitations, shortness of breath, heartburn, ulcer, joint pain, back pain, headaches, anxiety, depression, h/o transfusions, sinus problems.    EXAM: Wt Readings from Last 3 Encounters:  12/29/13 174 lb 9.6 oz (79.198 kg)  12/14/13 171 lb 12.8 oz (77.928 kg)  07/05/13 166 lb (75.297 kg)   Temp Readings from Last 3 Encounters:  12/29/13 98 F (36.7 C) Oral  12/14/13 98.2 F (36.8 C) Oral  04/03/13 97.6 F (36.4 C) Oral   BP Readings from Last 3 Encounters:  12/29/13 162/88  12/14/13 146/102  07/05/13 130/88   Pulse Readings from Last 3 Encounters:  12/29/13 65  07/05/13 70  04/03/13 68     Wt Readings from Last 3 Encounters:  12/29/13 174 lb 9.6 oz (79.198 kg)  12/14/13 171 lb 12.8 oz (77.928 kg)  07/05/13 166 lb (75.297 kg)     Gen:  No acute distress.  Well nourished and well groomed.   Neurological: Alert and oriented to person, place, and time. Coordination normal.  Head: Normocephalic and atraumatic.  Eyes: Conjunctivae are normal. Pupils are equal, round, and reactive to light. No scleral icterus.  Neck: Normal range of motion. Neck supple. No tracheal deviation or thyromegaly present.  Cardiovascular: Normal rate, regular rhythm, normal heart sounds and intact distal pulses.  Exam reveals no gallop and no friction rub.  No murmur heard. Breast: palpable mass on right, no palpable mass on the left.  No  palpable lymphadenopathy.  No skin dimpling or nipple retraction, no nipple discharge.   Respiratory: Effort normal.  No respiratory distress. No chest wall tenderness. Breath sounds normal.  No wheezes, rales or rhonchi.  GI: Soft. Bowel sounds are normal. The abdomen is soft and nontender.  There is no rebound and no  guarding.  Musculoskeletal: Normal range of motion. Extremities are nontender.  Lymphadenopathy: No cervical, preauricular, postauricular or axillary adenopathy is present Skin: Skin is warm and dry. No rash noted. No diaphoresis. No erythema. No pallor. No clubbing, cyanosis, or edema.   Psychiatric: Normal mood and affect. Behavior is normal. Judgment and thought content normal.    LABORATORY RESULTS: Available labs are reviewed   Recent Results (from the past 2160 hour(s))  CYTOLOGY - PAP     Status: None   Collection Time    12/14/13 12:00 AM      Result Value Ref Range   CYTOLOGY - PAP PAP RESULT    CBC WITH DIFFERENTIAL     Status: None   Collection Time    12/29/13  7:59 AM      Result Value Ref Range   WBC 9.6  3.9 - 10.3 10e3/uL   NEUT# 5.7  1.5 - 6.5 10e3/uL   HGB 14.8  11.6 - 15.9 g/dL   HCT 45.0  34.8 - 46.6 %   Platelets 243  145 - 400 10e3/uL   MCV 89.8  79.5 - 101.0 fL   MCH 29.5  25.1 - 34.0 pg   MCHC 32.9  31.5 - 36.0 g/dL   RBC 5.01  3.70 - 5.45 10e6/uL   RDW 12.9  11.2 - 14.5 %   lymph# 3.0  0.9 - 3.3 10e3/uL   MONO# 0.6  0.1 - 0.9 10e3/uL   Eosinophils Absolute 0.3  0.0 - 0.5 10e3/uL   Basophils Absolute 0.1  0.0 - 0.1 10e3/uL   NEUT% 59.3  38.4 - 76.8 %   LYMPH% 31.2  14.0 - 49.7 %   MONO% 6.2  0.0 - 14.0 %   EOS% 2.7  0.0 - 7.0 %   BASO% 0.6  0.0 - 2.0 %  COMPREHENSIVE METABOLIC PANEL ([facility]08)     Status: Abnormal   Collection Time    12/29/13  7:59 AM      Result Value Ref Range   Sodium 139  136 - 145 mEq/L   Potassium 3.9  3.5 - 5.1 mEq/L   Chloride 107  98 - 109 mEq/L   CO2 21 (*) 22 - 29 mEq/L   Glucose 91  70 - 140 mg/dl   BUN  11.0  7.0 - 26.0 mg/dL   Creatinine 0.7  0.6 - 1.1 mg/dL   Total Bilirubin 0.46  0.20 - 1.20 mg/dL   Alkaline Phosphatase 99  40 - 150 U/L   AST 24  5 - 34 U/L   ALT 20  0 - 55 U/L   Total Protein 7.2  6.4 - 8.3 g/dL   Albumin 3.6  3.5 - 5.0 g/dL   Calcium 8.7  8.4 - 10.4 mg/dL   Anion Gap 11  3 - 11 mEq/L     RADIOLOGY RESULTS: See E-Chart or I-Site for most recent results.  Images and reports are reviewed.  Mr Breast Bilateral W Wo Contrast  12/23/2013   CLINICAL DATA:  Patient is a 58 year old female with recent diagnosis of right breast IDC and DCIS on image guided biopsy.  LABS:  Creatinine 0.5, BUN 13, GFR 127  EXAM: BILATERAL BREAST MRI WITH AND WITHOUT CONTRAST  TECHNIQUE: Multiplanar, multisequence MR images of both breasts were obtained prior to and following the intravenous administration of 49m of MultiHance.  THREE-DIMENSIONAL MR IMAGE RENDERING ON INDEPENDENT WORKSTATION:  Three-dimensional MR images were rendered by post-processing of the original MR data on  an independent workstation. The three-dimensional MR images were interpreted, and findings are reported in the following complete MRI report for this study. Three dimensional images were evaluated at the independent DynaCad workstation  COMPARISON:  12/16/2013 mammography  FINDINGS: Breast composition: Heterogeneous fibroglandular tissue.  Background parenchymal enhancement: The multiplanar, multi sequence bilateral breast MR demonstrates moderate background parenchymal enhancement with scattered foci of non mass enhancement with benign MR features in each breast.  Right breast: A large heterogeneously enhancing irregular mass with spiculated and irregular margins is identified in the upper midline right breast at anterior depth with internal focus of metallic clip artifact consistent with biopsy-proven disease. This mass measures approximately 4.4 x 3 x 2.5 cm (transverse by AP by CC dimension). Incidental note is made of  prominent associated vasculature. Directly inferior to the index mass by approximately 1.5 cm, note is made of a sub cm round mass with irregular margins most consistent with a small satellite lesion. Clumped non mass enhancement extends from the anterior margin of the index mass to less than 1 cm from the base of the nipple.  Left breast: No abnormal mass or enhancement in the left breast.  Lymph nodes: The axillary and internal mammary lymph node chains are symmetric.  Ancillary findings:  None.  IMPRESSION: 1. Biopsy-proven malignancy in the upper midline right breast measures 4.4 x 3 x 2.5 cm. A small satellite mass is identified approximately 1.5 cm directly inferior to the index lesion. 2. Abnormal enhancement at site of disease extends to within 1 cm from the base of the right nipple. 3. No MR findings to suggest contralateral malignancy. 4. No adenopathy.  RECOMMENDATION: Recommend continued surgical management.  BI-RADS CATEGORY  6: Known biopsy-proven malignancy.   Electronically Signed   By: Andres Shad   On: 12/23/2013 12:01   Mm Digital Diagnostic Bilat  12/16/2013   CLINICAL DATA:  Patient is a 58 year old with a family history breast cancer diagnosed in aunt at age 70 who reports focal bruising on the left breast since March. She also describes bilateral breast tenderness.  EXAM: DIGITAL DIAGNOSTIC  BILATERAL MAMMOGRAM WITH CAD  ULTRASOUND BILATERAL BREAST  COMPARISON:  01/22/2006 mammogram  ACR Breast Density Category c: The breast tissue is heterogeneously dense, which may obscure small masses.  FINDINGS: Digital bilateral diagnostic mammography demonstrates a heterogeneously dense parenchymal pattern which may obscure detection of small masses. A metallic BB has been placed by the technologist overlying the lower inner left breast in region of concern as indicated by the patient. Spot-compression view in the tangential projection reveals effacement of normal fibroglandular tissue.  In the  right breast, an asymmetric density is identified in the upper midline right breast at anterior depth. This minimally effaces with spot compression magnification views and is associated with coarse heterogeneous microcalcifications spanning approximately 4 x 4 x 4.5 cm. This finding is highly suggestive of malignancy.  Mammographic images were processed with CAD.  On physical exam, a firm palpable mass is identified in the upper slightly outer right breast.  Targeted ultrasound is performed in the palpable area of concern in the right breast in the focal area of skin discoloration in the left breast. In the right breast examination demonstrates a heterogeneously hypoechoic irregular mass with indistinct margins at 11 o'clock 2 cm from the nipple. Given the indistinct margins exact dimensions are difficult to determine, though this measures approximately 4.1 x 3.3 x 1.9 cm in diameter and represents an excellent sonographic correlate to the mammographic finding of  note. Scattered areas of internal increased echogenicity are identified under real-time sonography suggestive of internal calcium. The leading edge of this mass is approximately 2 cm from the base of the nipple. Ultrasound of the right axilla demonstrates a few morphologically normal lymph nodes.  A focal spot of discoloration/erythema is visually identified in the inferior midline left breast at 6 o'clock approximately 7 cm from the nipple. The patient reports this has been present and unchanged since March of this year. Ultrasound evaluation demonstrates a heterogeneously hypoechoic oval mass with parallel orientation located entirely within the skin surface measuring approximately 1 x 1.2 x 0.2 cm in diameter. These findings are nonspecific on ultrasound, but differential considerations include a benign dermal cyst  IMPRESSION: 1. A palpable 4.1 cm diameter mass/asymmetry with associated coarse heterogeneous microcalcifications at 11 o'clock 2 cm from  the right nipple demonstrates mammographic and sonographic features highly suggestive of malignancy. 2. Focal discoloration of the skin in the inferior midline left breast corresponds to a 1.2 cm diameter dermal based mass.  RECOMMENDATION: 1. Recommend ultrasound-guided core needle biopsy of the palpable mass in the upper slightly outer right breast. 2. Recommend continued clinical management and consideration of referral to a dermatologist for the 1.2 cm diameter dermal based mass with associated discoloration of the skin in the inferior midline left breast. 3. Patient is tentatively scheduled for ultrasound-guided core needle biopsy the right breast later today.  I have discussed the findings and recommendations with the patient. Results were also provided in writing at the conclusion of the visit. If applicable, a reminder letter will be sent to the patient regarding the next appointment.  BI-RADS CATEGORY  5: Highly suggestive of malignancy.   Electronically Signed   By: Andres Shad   On: 12/16/2013 11:00   Mm Digital Diagnostic Unilat R  12/16/2013   CLINICAL DATA:  Status post ultrasound-guided core needle biopsy of the right breast  EXAM: DIAGNOSTIC RIGHT MAMMOGRAM POST ULTRASOUND BIOPSY  COMPARISON:  Previous exams  FINDINGS: Mammographic images were obtained following ultrasound guided biopsy of a palpable mass in the upper outer right breast. This demonstrates satisfactory positioning of the ribbon metal tissue marker located near the center of the biopsied mass/asymmetry with associated heterogeneous microcalcification. Minimal post biopsy changes are also identified.  IMPRESSION: Satisfactory positioning of the ribbon shaped metal tissue marker following ultrasound-guided core needle biopsy. Pathology is pending.  Final Assessment: Post Procedure Mammograms for Marker Placement   Electronically Signed   By: Andres Shad   On: 12/16/2013 11:50   US Breast Ltd Uni Left Inc Axilla  12/16/2013    CLINICAL DATA:  Patient is a 58 year old with a family history breast cancer diagnosed in aunt at age 23 who reports focal bruising on the left breast since March. She also describes bilateral breast tenderness.  EXAM: DIGITAL DIAGNOSTIC  BILATERAL MAMMOGRAM WITH CAD  ULTRASOUND BILATERAL BREAST  COMPARISON:  01/22/2006 mammogram  ACR Breast Density Category c: The breast tissue is heterogeneously dense, which may obscure small masses.  FINDINGS: Digital bilateral diagnostic mammography demonstrates a heterogeneously dense parenchymal pattern which may obscure detection of small masses. A metallic BB has been placed by the technologist overlying the lower inner left breast in region of concern as indicated by the patient. Spot-compression view in the tangential projection reveals effacement of normal fibroglandular tissue.  In the right breast, an asymmetric density is identified in the upper midline right breast at anterior depth. This minimally effaces with spot compression magnification views  and is associated with coarse heterogeneous microcalcifications spanning approximately 4 x 4 x 4.5 cm. This finding is highly suggestive of malignancy.  Mammographic images were processed with CAD.  On physical exam, a firm palpable mass is identified in the upper slightly outer right breast.  Targeted ultrasound is performed in the palpable area of concern in the right breast in the focal area of skin discoloration in the left breast. In the right breast examination demonstrates a heterogeneously hypoechoic irregular mass with indistinct margins at 11 o'clock 2 cm from the nipple. Given the indistinct margins exact dimensions are difficult to determine, though this measures approximately 4.1 x 3.3 x 1.9 cm in diameter and represents an excellent sonographic correlate to the mammographic finding of note. Scattered areas of internal increased echogenicity are identified under real-time sonography suggestive of internal  calcium. The leading edge of this mass is approximately 2 cm from the base of the nipple. Ultrasound of the right axilla demonstrates a few morphologically normal lymph nodes.  A focal spot of discoloration/erythema is visually identified in the inferior midline left breast at 6 o'clock approximately 7 cm from the nipple. The patient reports this has been present and unchanged since March of this year. Ultrasound evaluation demonstrates a heterogeneously hypoechoic oval mass with parallel orientation located entirely within the skin surface measuring approximately 1 x 1.2 x 0.2 cm in diameter. These findings are nonspecific on ultrasound, but differential considerations include a benign dermal cyst  IMPRESSION: 1. A palpable 4.1 cm diameter mass/asymmetry with associated coarse heterogeneous microcalcifications at 11 o'clock 2 cm from the right nipple demonstrates mammographic and sonographic features highly suggestive of malignancy. 2. Focal discoloration of the skin in the inferior midline left breast corresponds to a 1.2 cm diameter dermal based mass.  RECOMMENDATION: 1. Recommend ultrasound-guided core needle biopsy of the palpable mass in the upper slightly outer right breast. 2. Recommend continued clinical management and consideration of referral to a dermatologist for the 1.2 cm diameter dermal based mass with associated discoloration of the skin in the inferior midline left breast. 3. Patient is tentatively scheduled for ultrasound-guided core needle biopsy the right breast later today.  I have discussed the findings and recommendations with the patient. Results were also provided in writing at the conclusion of the visit. If applicable, a reminder letter will be sent to the patient regarding the next appointment.  BI-RADS CATEGORY  5: Highly suggestive of malignancy.   Electronically Signed   By: Andres Shad   On: 12/16/2013 11:00   US Breast Ltd Uni Right Inc Axilla  12/16/2013   CLINICAL DATA:   Patient is a 58 year old with a family history breast cancer diagnosed in aunt at age 43 who reports focal bruising on the left breast since March. She also describes bilateral breast tenderness.  EXAM: DIGITAL DIAGNOSTIC  BILATERAL MAMMOGRAM WITH CAD  ULTRASOUND BILATERAL BREAST  COMPARISON:  01/22/2006 mammogram  ACR Breast Density Category c: The breast tissue is heterogeneously dense, which may obscure small masses.  FINDINGS: Digital bilateral diagnostic mammography demonstrates a heterogeneously dense parenchymal pattern which may obscure detection of small masses. A metallic BB has been placed by the technologist overlying the lower inner left breast in region of concern as indicated by the patient. Spot-compression view in the tangential projection reveals effacement of normal fibroglandular tissue.  In the right breast, an asymmetric density is identified in the upper midline right breast at anterior depth. This minimally effaces with spot compression magnification views and is  associated with coarse heterogeneous microcalcifications spanning approximately 4 x 4 x 4.5 cm. This finding is highly suggestive of malignancy.  Mammographic images were processed with CAD.  On physical exam, a firm palpable mass is identified in the upper slightly outer right breast.  Targeted ultrasound is performed in the palpable area of concern in the right breast in the focal area of skin discoloration in the left breast. In the right breast examination demonstrates a heterogeneously hypoechoic irregular mass with indistinct margins at 11 o'clock 2 cm from the nipple. Given the indistinct margins exact dimensions are difficult to determine, though this measures approximately 4.1 x 3.3 x 1.9 cm in diameter and represents an excellent sonographic correlate to the mammographic finding of note. Scattered areas of internal increased echogenicity are identified under real-time sonography suggestive of internal calcium. The leading  edge of this mass is approximately 2 cm from the base of the nipple. Ultrasound of the right axilla demonstrates a few morphologically normal lymph nodes.  A focal spot of discoloration/erythema is visually identified in the inferior midline left breast at 6 o'clock approximately 7 cm from the nipple. The patient reports this has been present and unchanged since March of this year. Ultrasound evaluation demonstrates a heterogeneously hypoechoic oval mass with parallel orientation located entirely within the skin surface measuring approximately 1 x 1.2 x 0.2 cm in diameter. These findings are nonspecific on ultrasound, but differential considerations include a benign dermal cyst  IMPRESSION: 1. A palpable 4.1 cm diameter mass/asymmetry with associated coarse heterogeneous microcalcifications at 11 o'clock 2 cm from the right nipple demonstrates mammographic and sonographic features highly suggestive of malignancy. 2. Focal discoloration of the skin in the inferior midline left breast corresponds to a 1.2 cm diameter dermal based mass.  RECOMMENDATION: 1. Recommend ultrasound-guided core needle biopsy of the palpable mass in the upper slightly outer right breast. 2. Recommend continued clinical management and consideration of referral to a dermatologist for the 1.2 cm diameter dermal based mass with associated discoloration of the skin in the inferior midline left breast. 3. Patient is tentatively scheduled for ultrasound-guided core needle biopsy the right breast later today.  I have discussed the findings and recommendations with the patient. Results were also provided in writing at the conclusion of the visit. If applicable, a reminder letter will be sent to the patient regarding the next appointment.  BI-RADS CATEGORY  5: Highly suggestive of malignancy.   Electronically Signed   By: Andres Shad   On: 12/16/2013 11:00   [place] Rt Breast Bx W Loc Dev 1st Lesion Img Bx Spec US Guide  12/17/2013   ADDENDUM REPORT:  12/17/2013 17:03  ADDENDUM: Pathology for the ultrasound-guided core needle biopsy of the right breast is reported as invasive ductal carcinoma and ductal carcinoma in situ. Please refer to pathology report for further details. The malignant histology is concordant with pre biopsy imaging. Recommend consultation with a breast surgeon. The patient is scheduled for the multidisciplinary Clinic on 12/29/2013 at a.m. These results and recommendations were discussed with the patient by telephone at 4:15 p.m. on 12/17/2013.   Electronically Signed   By: Andres Shad   On: 12/17/2013 17:03   12/17/2013   CLINICAL DATA:  Patient is a 58 year old female with a palpable mass in the upper outer right breast highly suggestive for malignancy.  EXAM: ULTRASOUND GUIDED RIGHT BREAST CORE NEEDLE BIOPSY  COMPARISON:  Previous exams.  PROCEDURE: I met with the patient and we discussed the procedure of ultrasound-guided  biopsy, including benefits and alternatives. We discussed the high likelihood of a successful procedure. We discussed the risks of the procedure including infection, bleeding, tissue injury, clip migration, and inadequate sampling. Informed written consent was given. The usual time-out protocol was performed immediately prior to the procedure.  Using sterile technique and 2% Lidocaine as local anesthetic, under direct ultrasound visualization, a 12 gauge vacuum-assisteddevice was used to perform biopsy of hypoechoic irregular palpable mass using a lateral approach. At the conclusion of the procedure, a ribbon shaped tissue marker clip was deployed into biopsied mass. Follow-up 2-view mammogram was performed and dictated separately.  IMPRESSION: Ultrasound-guided biopsy of a palpable irregular mass in the upper outer right breast. No apparent complications. Pathology is pending.  Electronically Signed: By: Andres Shad On: 12/16/2013 11:51      ASSESSMENT AND PLAN: Breast cancer of upper-outer quadrant of right  female breast Pt has a T2 tumor with a sattellite nodule inferior to this.  We will biopsy this to ascertain if she is a breast conservation candidate.    She will get neoadjuvant chemotherapy for her 2 positive disease and for size.  If the second area is not cancer, we may be able to do breast conservation.    I reviewed port a cath placement with her.  I discussed risks of bleeding, infection, damage to adjacent structures, need for additional procedures, malposition, malfunction, pneumothorax, etc.    I discussed post op restrictions and recovery.  I advised that she will have restrictions for 1 week.    I will see her back in for the port, then near the midpoint of the chemotherapy.     45 min spent in evaluation, examination, counseling, and coordination of care.    Milus Height MD Surgical Oncology, General and [facility]      Visit Diagnoses: 1. Breast cancer of upper-outer quadrant of right female breast     Primary Care Physician: Lamar Blinks, MD  Other care team Bertis Ruddy

## 2014-01-20 NOTE — Interval H&P Note (Signed)
History and Physical Interval Note:  01/20/2014 10:36 AM  [name]  has presented today for surgery, with the diagnosis of right breast cancer  The various methods of treatment have been discussed with the patient and family. After consideration of risks, benefits and other options for treatment, the patient has consented to  Procedure(s): INSERTION PORT-A-CATH (N/A) as a surgical intervention .  The patient's history has been reviewed, patient examined, no change in status, stable for surgery.  I have reviewed the patient's chart and labs.  Questions were answered to the patient's satisfaction.     Yolinda Duerr

## 2014-01-20 NOTE — Discharge Instructions (Addendum)
[facility] °Office Phone Number [phone] ° ° POST OP INSTRUCTIONS ° °Always review your discharge instruction sheet given to you by the facility where your surgery was performed. ° °IF YOU HAVE DISABILITY OR FAMILY LEAVE FORMS, YOU MUST BRING THEM TO THE OFFICE FOR PROCESSING.  DO NOT GIVE THEM TO YOUR DOCTOR. ° °1. A prescription for pain medication may be given to you upon discharge.  Take your pain medication as prescribed, if needed.  If narcotic pain medicine is not needed, then you may take acetaminophen (Tylenol) or ibuprofen (Advil) as needed. °2. Take your usually prescribed medications unless otherwise directed °3. If you need a refill on your pain medication, please contact your pharmacy.  They will contact our office to request authorization.  Prescriptions will not be filled after 5pm or on week-ends. °4. You should eat very light the first 24 hours after surgery, such as soup, crackers, pudding, etc.  Resume your normal diet the day after surgery °5. It is common to experience some constipation if taking pain medication after surgery.  Increasing fluid intake and taking a stool softener will usually help or prevent this problem from occurring.  A mild laxative (Milk of Magnesia or Miralax) should be taken according to package directions if there are no bowel movements after 48 hours. °6. You may shower in 48 hours.  The surgical glue will flake off in 2-3 weeks.   °7. ACTIVITIES:  No strenuous activity or heavy lifting for 1 week.   °a. You may drive when you no longer are taking prescription pain medication, you can comfortably wear a seatbelt, and you can safely maneuver your car and apply brakes. °b. RETURN TO WORK:  __________1 week_______________ °You should see your doctor in the office for a follow-up appointment approximately three-four weeks after your surgery.   ° °WHEN TO CALL YOUR DOCTOR: °1. Fever over 101.0 °2. Nausea and/or vomiting. °3. Extreme swelling or  bruising. °4. Continued bleeding from incision. °5. Increased pain, redness, or drainage from the incision. ° °The clinic staff is available to answer your questions during regular business hours.  Please don’t hesitate to call and ask to speak to one of the nurses for clinical concerns.  If you have a medical emergency, go to the nearest emergency room or call 911.  A surgeon from [facility] is always on call at the hospital. ° °For further questions, please visit [email or web address]  ° ° ° °Post Anesthesia Home Care Instructions ° °Activity: °Get plenty of rest for the remainder of the day. A responsible adult should stay with you for 24 hours following the procedure.  °For the next 24 hours, DO NOT: °-Drive a car °-Operate machinery °-Drink alcoholic beverages °-Take any medication unless instructed by your physician °-Make any legal decisions or sign important papers. ° °Meals: °Start with liquid foods such as gelatin or soup. Progress to regular foods as tolerated. Avoid greasy, spicy, heavy foods. If nausea and/or vomiting occur, drink only clear liquids until the nausea and/or vomiting subsides. Call your physician if vomiting continues. ° °Special Instructions/Symptoms: °Your throat may feel dry or sore from the anesthesia or the breathing tube placed in your throat during surgery. If this causes discomfort, gargle with warm salt water. The discomfort should disappear within 24 hours. ° °

## 2014-01-21 ENCOUNTER — Encounter (HOSPITAL_BASED_OUTPATIENT_CLINIC_OR_DEPARTMENT_OTHER): Payer: Self-pay | Admitting: General Surgery

## 2014-01-25 ENCOUNTER — Other Ambulatory Visit: Payer: Medicaid Other

## 2014-01-25 ENCOUNTER — Ambulatory Visit: Payer: Self-pay | Admitting: Hematology and Oncology

## 2014-01-26 ENCOUNTER — Other Ambulatory Visit: Payer: Self-pay | Admitting: Hematology and Oncology

## 2014-01-26 ENCOUNTER — Ambulatory Visit: Payer: Medicaid Other

## 2014-01-26 DIAGNOSIS — C50411 Malignant neoplasm of upper-outer quadrant of right female breast: Secondary | ICD-10-CM

## 2014-01-26 MED ORDER — ONDANSETRON HCL 8 MG PO TABS: 8.0000 mg | ORAL_TABLET | Freq: Two times a day (BID) | ORAL | Status: DC

## 2014-01-26 MED ORDER — DEXAMETHASONE 4 MG PO TABS: 8.0000 mg | ORAL_TABLET | Freq: Two times a day (BID) | ORAL | Status: DC

## 2014-01-26 MED ORDER — LORAZEPAM 0.5 MG PO TABS: 0.5000 mg | ORAL_TABLET | Freq: Four times a day (QID) | ORAL | Status: DC | PRN

## 2014-01-26 MED ORDER — PROCHLORPERAZINE MALEATE 10 MG PO TABS: 10.0000 mg | ORAL_TABLET | Freq: Four times a day (QID) | ORAL | Status: DC | PRN

## 2014-01-27 ENCOUNTER — Other Ambulatory Visit: Payer: Self-pay

## 2014-01-27 ENCOUNTER — Other Ambulatory Visit: Payer: Self-pay | Admitting: *Deleted

## 2014-01-27 DIAGNOSIS — C50412 Malignant neoplasm of upper-outer quadrant of left female breast: Secondary | ICD-10-CM

## 2014-01-27 DIAGNOSIS — C50411 Malignant neoplasm of upper-outer quadrant of right female breast: Secondary | ICD-10-CM

## 2014-01-27 MED ORDER — LIDOCAINE-PRILOCAINE 2.5-2.5 % EX CREA: TOPICAL_CREAM | CUTANEOUS | Status: DC

## 2014-01-27 MED ORDER — LIDOCAINE-PRILOCAINE 2.5-2.5 % EX CREA
1.0000 | TOPICAL_CREAM | CUTANEOUS | Status: DC | PRN
Start: 2014-01-27 — End: 2014-02-26

## 2014-01-28 ENCOUNTER — Ambulatory Visit (HOSPITAL_BASED_OUTPATIENT_CLINIC_OR_DEPARTMENT_OTHER): Payer: Self-pay | Admitting: Hematology and Oncology

## 2014-01-28 ENCOUNTER — Telehealth: Payer: Self-pay | Admitting: Hematology and Oncology

## 2014-01-28 ENCOUNTER — Other Ambulatory Visit (HOSPITAL_BASED_OUTPATIENT_CLINIC_OR_DEPARTMENT_OTHER): Payer: Self-pay

## 2014-01-28 ENCOUNTER — Telehealth: Payer: Self-pay | Admitting: *Deleted

## 2014-01-28 ENCOUNTER — Ambulatory Visit (HOSPITAL_BASED_OUTPATIENT_CLINIC_OR_DEPARTMENT_OTHER): Payer: Self-pay

## 2014-01-28 VITALS — BP 131/82 | HR 77 | Temp 97.9°F | Resp 18 | Ht 64.0 in | Wt 171.4 lb

## 2014-01-28 VITALS — BP 140/80 | HR 69 | Temp 98.7°F | Resp 20

## 2014-01-28 DIAGNOSIS — C50419 Malignant neoplasm of upper-outer quadrant of unspecified female breast: Secondary | ICD-10-CM

## 2014-01-28 DIAGNOSIS — Z170 Estrogen receptor positive status [ER+]: Secondary | ICD-10-CM

## 2014-01-28 DIAGNOSIS — C50411 Malignant neoplasm of upper-outer quadrant of right female breast: Secondary | ICD-10-CM

## 2014-01-28 DIAGNOSIS — F411 Generalized anxiety disorder: Secondary | ICD-10-CM

## 2014-01-28 DIAGNOSIS — Z5112 Encounter for antineoplastic immunotherapy: Secondary | ICD-10-CM

## 2014-01-28 DIAGNOSIS — Z5111 Encounter for antineoplastic chemotherapy: Secondary | ICD-10-CM

## 2014-01-28 LAB — CBC WITH DIFFERENTIAL/PLATELET
BASO%: 0.7 % (ref 0.0–2.0)
BASOS ABS: 0.1 10*3/uL (ref 0.0–0.1)
EOS ABS: 0.3 10*3/uL (ref 0.0–0.5)
EOS%: 2.7 % (ref 0.0–7.0)
HEMATOCRIT: 45.5 % (ref 34.8–46.6)
HGB: 14.9 g/dL (ref 11.6–15.9)
LYMPH%: 29.3 % (ref 14.0–49.7)
MCH: 29.5 pg (ref 25.1–34.0)
MCHC: 32.8 g/dL (ref 31.5–36.0)
MCV: 90 fL (ref 79.5–101.0)
MONO#: 0.6 10*3/uL (ref 0.1–0.9)
MONO%: 5.7 % (ref 0.0–14.0)
NEUT%: 61.6 % (ref 38.4–76.8)
NEUTROS ABS: 6 10*3/uL (ref 1.5–6.5)
Platelets: 243 10*3/uL (ref 145–400)
RBC: 5.05 10*6/uL (ref 3.70–5.45)
RDW: 13.2 % (ref 11.2–14.5)
WBC: 9.8 10*3/uL (ref 3.9–10.3)
lymph#: 2.9 10*3/uL (ref 0.9–3.3)

## 2014-01-28 LAB — COMPREHENSIVE METABOLIC PANEL (CC13)
ALK PHOS: 110 U/L (ref 40–150)
ALT: 18 U/L (ref 0–55)
ANION GAP: 12 meq/L — AB (ref 3–11)
AST: 22 U/L (ref 5–34)
Albumin: 3.6 g/dL (ref 3.5–5.0)
BUN: 8.6 mg/dL (ref 7.0–26.0)
CO2: 23 meq/L (ref 22–29)
Calcium: 8.9 mg/dL (ref 8.4–10.4)
Chloride: 106 mEq/L (ref 98–109)
Creatinine: 0.7 mg/dL (ref 0.6–1.1)
GLUCOSE: 104 mg/dL (ref 70–140)
Potassium: 4.3 mEq/L (ref 3.5–5.1)
Sodium: 140 mEq/L (ref 136–145)
TOTAL PROTEIN: 7.3 g/dL (ref 6.4–8.3)
Total Bilirubin: 0.26 mg/dL (ref 0.20–1.20)

## 2014-01-28 MED ORDER — DIPHENHYDRAMINE HCL 25 MG PO CAPS
50.0000 mg | ORAL_CAPSULE | Freq: Once | ORAL | Status: AC
  Administered 2014-01-28: 50 mg via ORAL

## 2014-01-28 MED ORDER — SODIUM CHLORIDE 0.9 % IV SOLN
Freq: Once | INTRAVENOUS | Status: AC
  Administered 2014-01-28: 10:00:00 via INTRAVENOUS

## 2014-01-28 MED ORDER — DOCETAXEL CHEMO INJECTION 160 MG/16ML
75.0000 mg/m2 | Freq: Once | INTRAVENOUS | Status: AC
  Administered 2014-01-28: 140 mg via INTRAVENOUS
  Filled 2014-01-28: qty 14

## 2014-01-28 MED ORDER — SODIUM CHLORIDE 0.9 % IV SOLN
840.0000 mg | Freq: Once | INTRAVENOUS | Status: AC
  Administered 2014-01-28: 840 mg via INTRAVENOUS
  Filled 2014-01-28: qty 28

## 2014-01-28 MED ORDER — ONDANSETRON 16 MG/50ML IVPB (CHCC)
INTRAVENOUS | Status: AC
  Filled 2014-01-28: qty 16

## 2014-01-28 MED ORDER — ACETAMINOPHEN 325 MG PO TABS
ORAL_TABLET | ORAL | Status: AC
Start: 2014-01-28 — End: 2014-01-28
  Filled 2014-01-28: qty 2

## 2014-01-28 MED ORDER — ACETAMINOPHEN 325 MG PO TABS
650.0000 mg | ORAL_TABLET | Freq: Once | ORAL | Status: AC
  Administered 2014-01-28: 650 mg via ORAL

## 2014-01-28 MED ORDER — ONDANSETRON 16 MG/50ML IVPB (CHCC)
16.0000 mg | Freq: Once | INTRAVENOUS | Status: AC
  Administered 2014-01-28: 16 mg via INTRAVENOUS

## 2014-01-28 MED ORDER — SODIUM CHLORIDE 0.9 % IV SOLN
700.0000 mg | Freq: Once | INTRAVENOUS | Status: AC
  Administered 2014-01-28: 700 mg via INTRAVENOUS
  Filled 2014-01-28: qty 70

## 2014-01-28 MED ORDER — DEXAMETHASONE SODIUM PHOSPHATE 20 MG/5ML IJ SOLN
INTRAMUSCULAR | Status: AC
  Filled 2014-01-28: qty 5

## 2014-01-28 MED ORDER — HEPARIN SOD (PORK) LOCK FLUSH 100 UNIT/ML IV SOLN
500.0000 [IU] | Freq: Once | INTRAVENOUS | Status: AC | PRN
  Administered 2014-01-28: 500 [IU]
  Filled 2014-01-28: qty 5

## 2014-01-28 MED ORDER — SODIUM CHLORIDE 0.9 % IJ SOLN
10.0000 mL | INTRAMUSCULAR | Status: DC | PRN
  Administered 2014-01-28: 10 mL
  Filled 2014-01-28: qty 10

## 2014-01-28 MED ORDER — DIPHENHYDRAMINE HCL 25 MG PO CAPS
ORAL_CAPSULE | ORAL | Status: AC
  Filled 2014-01-28: qty 2

## 2014-01-28 MED ORDER — TRASTUZUMAB CHEMO INJECTION 440 MG
8.0000 mg/kg | Freq: Once | INTRAVENOUS | Status: AC
  Administered 2014-01-28: 609 mg via INTRAVENOUS
  Filled 2014-01-28: qty 29

## 2014-01-28 MED ORDER — DEXAMETHASONE SODIUM PHOSPHATE 20 MG/5ML IJ SOLN
20.0000 mg | Freq: Once | INTRAMUSCULAR | Status: AC
  Administered 2014-01-28: 20 mg via INTRAVENOUS

## 2014-01-28 NOTE — Progress Notes (Signed)
Report received from Call A Nurse 01/27/14 445 pm.  Pt called in reporting she could not afford anti-emetics and needed them transferred to [place].  Pt was instructed by Triage nurse to call [facility] outpatient to have scrips transferred.  Pt voiced understanding.    Called Raquel when she came in to the office -pt needed to sign her grant form.  Pt signed and is making arrangements for pickup.  Pt given prescription for Ativan to fill.

## 2014-01-28 NOTE — Addendum Note (Signed)
Addended by: Prentiss Bells on: 01/28/2014 12:36 PM   Modules accepted: Orders

## 2014-01-28 NOTE — Telephone Encounter (Signed)
, °

## 2014-01-28 NOTE — Patient Instructions (Addendum)
[facility] Discharge Instructions for Patients Receiving Chemotherapy Today you received the following chemotherapy agents  Taxol, Cytoxan, Herceptin,Prejeta  To help prevent nausea and vomiting after your treatment, we encourage you to take your nausea medication  Compazine as directed    If you develop nausea and vomiting that is not controlled by your nausea medication, call the clinic.   BELOW ARE SYMPTOMS THAT SHOULD BE REPORTED IMMEDIATELY:  *FEVER GREATER THAN 100.5 F  *CHILLS WITH OR WITHOUT FEVER  NAUSEA AND VOMITING THAT IS NOT CONTROLLED WITH YOUR NAUSEA MEDICATION  *UNUSUAL SHORTNESS OF BREATH  *UNUSUAL BRUISING OR BLEEDING  TENDERNESS IN MOUTH AND THROAT WITH OR WITHOUT PRESENCE OF ULCERS  *URINARY PROBLEMS  *BOWEL PROBLEMS  UNUSUAL RASH Items with * indicate a potential emergency and should be followed up as soon as possible.  Feel free to call the clinic you have any questions or concerns. The clinic phone number is [phone].

## 2014-01-28 NOTE — Addendum Note (Signed)
Addended by: Prentiss Bells on: 01/28/2014 12:49 PM   Modules accepted: Orders

## 2014-01-28 NOTE — Telephone Encounter (Signed)
Per staff message and POF I have scheduled appts. Advised scheduler of appts. JMW  

## 2014-01-28 NOTE — Assessment & Plan Note (Signed)
1. 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 Patient had a PET/CT scan for staging which did not show distant metastatic disease but did show bilateral parotid nodules of unclear significance. Echocardiogram showed EF of 55-60%. Patient is worried to start neoadjuvant chemotherapy after having gone through chemotherapy education. She has difficulty filling her prescriptions. We were able to obtain assistance for her medications with the help of our cancer found.  2. I went over the chemotherapy regimen once again. All of her questions including antiemetics regimen were answered. She will come back in one week for toxicity check. She will see Mendel Ryder a nurse practitioner at that time. We will do CBC CMP and followup.  3. we will monitor very closely for chemotherapy related toxicities. 4. 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 This will also help calm her the side effects of Decadron. 5. prescription for Decadron, Compazine, Ativan and Zofran were sent along with prescription for EMLA cream

## 2014-01-28 NOTE — Progress Notes (Signed)
Patient Care Team: Darreld Mclean, MD as PCP - General (Family Medicine) Rulon Eisenmenger, MD as Consulting Physician (Hematology and Oncology) [name], MD as Consulting Physician (General Surgery) Thea Silversmith, MD as Consulting Physician (Radiation Oncology)  DIAGNOSIS: Breast cancer of upper-outer quadrant of right female breast   Primary site: Breast (Right)   Staging method: AJCC 7th Edition   Clinical: Stage IIA (T2, N0, cM0)   Summary: Stage IIA (T2, N0, cM0)   Clinical comments: Staged at breast conference 12/29/13.   SUMMARY OF ONCOLOGIC HISTORY:   Breast cancer of upper-outer quadrant of right female breast   12/16/2013 Mammogram 1. A palpable 4.1 cm diameter mass/asymmetry with associated coarse heterogeneous microcalcifications at 11 o'clock 2 cm from the right nipple demonstrates mammographic and sonographic features highly suggestive of malignancy.    12/16/2013 Initial Biopsy Breast cancer of upper-outer quadrant of right female breast; Invasive Ductal Cancer Er 100%, PR: 0%; Her2 Positive Ratio 4.28: Ki 67:24%;    12/23/2013 Breast MRI Right Breast 4.4 x 3 x 2.5 cm and a sub cm satellite lesion. No LN   01/28/2014 -  Neo-Adjuvant Chemotherapy Taxotere, carboplatin, Herceptin, Perjeta every 3 weeks x6 cycles    CHIEF COMPLIANT: Patient is here for cycle one neoadjuvant chemotherapy.  INTERVAL HISTORY: [name] is a 58 year old Caucasian lady with above-mentioned history of large right-sided breast cancer that was HER-2 positive. We recommended neoadjuvant chemotherapy. She had a port placement recently. After couple of days of pain, the pain had gone away. She has been very anxious to get started with chemotherapy. She is here to review the results of the PET CT scan, echocardiogram, to sign the consent and to start for cycle of neoadjuvant chemotherapy with Taxotere, carboplatin, Herceptin, Perjeta. Our plan is to give her 6 cycles of chemotherapy once every 3 weeks with  Neulasta on day 2.   REVIEW OF SYSTEMS:   Constitutional: Denies fevers, chills or abnormal weight loss Eyes: Denies blurriness of vision Ears, nose, mouth, throat, and face: Denies mucositis or sore throat Respiratory: Denies cough, dyspnea or wheezes Cardiovascular: Denies palpitation, chest discomfort or lower extremity swelling Gastrointestinal:  Denies nausea, heartburn or change in bowel habits Skin: Denies abnormal skin rashes Lymphatics: Denies new lymphadenopathy or easy bruising Neurological:Denies numbness, tingling or new weaknesses Behavioral/Psych: Mood is stable, no new changes  Breast: Bruising in the breast fascia biopsy on the right side, left-sided chest port is still mildly tender All other systems were reviewed with the patient and are negative.  I have reviewed the past medical history, past surgical history, social history and family history with the patient and they are unchanged from previous note.  ALLERGIES:  is allergic to codeine.  MEDICATIONS:  Current Outpatient Prescriptions  Medication Sig Dispense Refill  . ALPRAZolam (XANAX) 0.5 MG tablet Take 1 tablet (0.5 mg total) by mouth 2 (two) times daily as needed for anxiety.  60 tablet  5  . atenolol (TENORMIN) 50 MG tablet TAKE 1 TABLET BY MOUTH 2 TIMES A DAY  60 tablet  12  . dexamethasone (DECADRON) 4 MG tablet Take 2 tablets (8 mg total) by mouth 2 (two) times daily. Start the day before Taxotere. Then again the day after chemo for 3 days.  30 tablet  1  . lidocaine-prilocaine (EMLA) cream Apply 1 application topically as needed. Apply to [facility] a cath site 2 hours prior to chemotherapy appointment  30 g  2  . LORazepam (ATIVAN) 0.5 MG tablet Take 1  tablet (0.5 mg total) by mouth every 6 (six) hours as needed (Nausea or vomiting).  30 tablet  0  . Multiple Vitamin (MULTIVITAMIN) tablet Take 1 tablet by mouth daily.        . naproxen sodium (ANAPROX) 220 MG tablet Take 220 mg by mouth 2 (two) times daily  with a meal.      . omeprazole (PRILOSEC) 20 MG capsule Take 20 mg by mouth every evening.       . ondansetron (ZOFRAN) 8 MG tablet Take 1 tablet (8 mg total) by mouth 2 (two) times daily. Start the day after chemo for 3 days. Then take as needed for nausea or vomiting.  30 tablet  1  . oxyCODONE-acetaminophen (ROXICET) 5-325 MG per tablet Take 1-2 tablets by mouth every 4 (four) hours as needed for severe pain.  30 tablet  0  . prochlorperazine (COMPAZINE) 10 MG tablet Take 1 tablet (10 mg total) by mouth every 6 (six) hours as needed (Nausea or vomiting).  30 tablet  1  . sertraline (ZOLOFT) 50 MG tablet Take 50 mg by mouth at bedtime.       No current facility-administered medications for this visit.    PHYSICAL EXAMINATION: ECOG PERFORMANCE STATUS: 1 - Symptomatic but completely ambulatory  Filed Vitals:   01/28/14 0852  BP: 131/82  Pulse: 77  Temp: 97.9 F (36.6 C)  Resp: 18   Filed Weights   01/28/14 0852  Weight: 171 lb 6.4 oz (77.747 kg)    GENERAL:alert, no distress and comfortable SKIN: skin color, texture, turgor are normal, no rashes or significant lesions EYES: normal, Conjunctiva are pink and non-injected, sclera clear OROPHARYNX:no exudate, no erythema and lips, buccal mucosa, and tongue normal  NECK: supple, thyroid normal size, non-tender, without nodularity LYMPH:  no palpable lymphadenopathy in the cervical, axillary or inguinal LUNGS: clear to auscultation and percussion with normal breathing effort HEART: regular rate & rhythm and no murmurs and no lower extremity edema ABDOMEN:abdomen soft, non-tender and normal bowel sounds Musculoskeletal:no cyanosis of digits and no clubbing  NEURO: alert & oriented x 3 with fluent speech, no focal motor/sensory deficits PSYCH: Anxious but not depressed   LABORATORY DATA:  I have reviewed the data as listed   Chemistry      Component Value Date/Time   NA 140 01/28/2014 0832   NA 139 04/03/2013 1420   K 4.3  01/28/2014 0832   K 4.5 04/03/2013 1420   CL 104 04/03/2013 1420   CO2 23 01/28/2014 0832   CO2 25 04/03/2013 1420   BUN 8.6 01/28/2014 0832   BUN 15 04/03/2013 1420   CREATININE 0.7 01/28/2014 0832   CREATININE 0.76 04/03/2013 1420   CREATININE 0.65 02/02/2009 1034      Component Value Date/Time   CALCIUM 8.9 01/28/2014 0832   CALCIUM 9.3 04/03/2013 1420   ALKPHOS 110 01/28/2014 0832   ALKPHOS 107 04/03/2013 1420   AST 22 01/28/2014 0832   AST 18 04/03/2013 1420   ALT 18 01/28/2014 0832   ALT 17 04/03/2013 1420   BILITOT 0.26 01/28/2014 0832   BILITOT 0.4 04/03/2013 1420       Lab Results  Component Value Date   WBC 9.8 01/28/2014   HGB 14.9 01/28/2014   HCT 45.5 01/28/2014   MCV 90.0 01/28/2014   PLT 243 01/28/2014   NEUTROABS 6.0 01/28/2014     RADIOGRAPHIC STUDIES: I have personally reviewed the radiology reports and agreed with their findings. No results found.  ASSESSMENT & PLAN:  Breast cancer of upper-outer quadrant of right female breast 1. 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 Patient had a PET/CT scan for staging which did not show distant metastatic disease but did show bilateral parotid nodules of unclear significance. Echocardiogram showed EF of 55-60%. Patient is worried to start neoadjuvant chemotherapy after having gone through chemotherapy education. She has difficulty filling her prescriptions. We were able to obtain assistance for her medications with the help of our cancer found.  2. I went over the chemotherapy regimen once again. All of her questions including antiemetics regimen were answered. She will come back in one week for toxicity check. She will see Mendel Ryder a nurse practitioner at that time. We will do CBC CMP and followup.  3. we will  monitor very closely for chemotherapy related toxicities. 4. 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 This will also help calm her the side effects of Decadron. 5. prescription for Decadron, Compazine, Ativan and Zofran were sent along with prescription for EMLA cream     No orders of the defined types were placed in this encounter.   The patient has a good understanding of the overall plan. she agrees with it. She will call with any problems that may develop before her next visit here.  I spent 25 minutes counseling the patient face to face. The total time spent in the appointment was 30 minutes and more than 50% was on counseling and review of test results    Rulon Eisenmenger, MD 01/28/2014 9:28 AM

## 2014-01-29 ENCOUNTER — Ambulatory Visit (HOSPITAL_BASED_OUTPATIENT_CLINIC_OR_DEPARTMENT_OTHER): Payer: Self-pay

## 2014-01-29 VITALS — BP 164/95 | HR 82 | Temp 98.0°F

## 2014-01-29 DIAGNOSIS — C50411 Malignant neoplasm of upper-outer quadrant of right female breast: Secondary | ICD-10-CM

## 2014-01-29 DIAGNOSIS — C50419 Malignant neoplasm of upper-outer quadrant of unspecified female breast: Secondary | ICD-10-CM

## 2014-01-29 DIAGNOSIS — Z5189 Encounter for other specified aftercare: Secondary | ICD-10-CM

## 2014-01-29 MED ORDER — PEGFILGRASTIM INJECTION 6 MG/0.6ML
6.0000 mg | Freq: Once | SUBCUTANEOUS | Status: AC
  Administered 2014-01-29: 6 mg via SUBCUTANEOUS

## 2014-01-29 NOTE — Patient Instructions (Signed)
Pegfilgrastim injection What is this medicine? PEGFILGRASTIM (peg fil GRA stim) is a long-acting granulocyte colony-stimulating factor that stimulates the growth of neutrophils, a type of white blood cell important in the body's fight against infection. It is used to reduce the incidence of fever and infection in patients with certain types of cancer who are receiving chemotherapy that affects the bone marrow. This medicine may be used for other purposes; ask your health care provider or pharmacist if you have questions. COMMON BRAND NAME(S): Neulasta What should I tell my health care provider before I take this medicine? They need to know if you have any of these conditions: -latex allergy -ongoing radiation therapy -sickle cell disease -skin reactions to acrylic adhesives (On-Body Injector only) -an unusual or allergic reaction to pegfilgrastim, filgrastim, other medicines, foods, dyes, or preservatives -pregnant or trying to get pregnant -breast-feeding How should I use this medicine? This medicine is for injection under the skin. If you get this medicine at home, you will be taught how to prepare and give the pre-filled syringe or how to use the On-body Injector. Refer to the patient Instructions for Use for detailed instructions. Use exactly as directed. Take your medicine at regular intervals. Do not take your medicine more often than directed. It is important that you put your used needles and syringes in a special sharps container. Do not put them in a trash can. If you do not have a sharps container, call your pharmacist or healthcare provider to get one. Talk to your pediatrician regarding the use of this medicine in children. Special care may be needed. Overdosage: If you think you have taken too much of this medicine contact a poison control center or emergency room at once. NOTE: This medicine is only for you. Do not share this medicine with others. What if I miss a dose? It is  important not to miss your dose. Call your doctor or health care professional if you miss your dose. If you miss a dose due to an On-body Injector failure or leakage, a new dose should be administered as soon as possible using a single prefilled syringe for manual use. What may interact with this medicine? Interactions have not been studied. Give your health care provider a list of all the medicines, herbs, non-prescription drugs, or dietary supplements you use. Also tell them if you smoke, drink alcohol, or use illegal drugs. Some items may interact with your medicine. This list may not describe all possible interactions. Give your health care provider a list of all the medicines, herbs, non-prescription drugs, or dietary supplements you use. Also tell them if you smoke, drink alcohol, or use illegal drugs. Some items may interact with your medicine. What should I watch for while using this medicine? You may need blood work done while you are taking this medicine. If you are going to need a MRI, CT scan, or other procedure, tell your doctor that you are using this medicine (On-Body Injector only). What side effects may I notice from receiving this medicine? Side effects that you should report to your doctor or health care professional as soon as possible: -allergic reactions like skin rash, itching or hives, swelling of the face, lips, or tongue -dizziness -fever -pain, redness, or irritation at site where injected -pinpoint red spots on the skin -shortness of breath or breathing problems -stomach or side pain, or pain at the shoulder -swelling -tiredness -trouble passing urine Side effects that usually do not require medical attention (report to your doctor   or health care professional if they continue or are bothersome): -bone pain -muscle pain This list may not describe all possible side effects. Call your doctor for medical advice about side effects. You may report side effects to FDA at  1-800-FDA-1088. Where should I keep my medicine? Keep out of the reach of children. Store pre-filled syringes in a refrigerator between 2 and 8 degrees C (36 and 46 degrees F). Do not freeze. Keep in carton to protect from light. Throw away this medicine if it is left out of the refrigerator for more than 48 hours. Throw away any unused medicine after the expiration date. NOTE: This sheet is a summary. It may not cover all possible information. If you have questions about this medicine, talk to your doctor, pharmacist, or health care provider.  2015, Elsevier/Gold Standard. (2013-08-05 16:14:05)  

## 2014-01-31 ENCOUNTER — Telehealth: Payer: Self-pay | Admitting: *Deleted

## 2014-01-31 ENCOUNTER — Other Ambulatory Visit: Payer: Self-pay | Admitting: Family Medicine

## 2014-01-31 ENCOUNTER — Encounter: Payer: Self-pay | Admitting: *Deleted

## 2014-01-31 NOTE — Telephone Encounter (Signed)
Does not like taking steroid-took for 2 days post tx and stopped last night. More fatigue today and decreased appetite, but is pushing fluids and snacking. No n/v. Mild aches in back and left hip controlled with Aleve.

## 2014-01-31 NOTE — Progress Notes (Signed)
[facility] Psychosocial Distress Screening Clinical Social Work  Clinical Social Work was referred by distress screening protocol.  The patient scored a 10 on the Psychosocial Distress Thermometer which indicates severe distress. Clinical Social Worker contacted patient at home to assess for distress and other psychosocial needs.  CSW was unable to reach patient by phone.  CSW left message offering support and encouraging patient to call back with needs or concerns.  CSW will continue attempting to contact patient.      [facility] DISTRESS SCREENING 01/28/2014  Screening Type   [place]ta [place] the number that describes how much distress you have been experiencing in the past week 10  Practical problem type   Family Problem type   Emotional problem type Depression;Nervousness/Anxiety;Adjusting to illness;Feeling hopeless  Spiritual/Religous concerns type Facing my mortality  Physical Problem type Pain;Nausea/vomiting  Physician notified of physical symptoms   Referral to clinical psychology   Referral to clinical social work   Referral to dietition   Referral to financial advocate   Referral to support programs   Referral to palliative care   Other     Johnnye Lana, MSW, LCSW, OSW-C Clinical Social Worker [place] [phone]

## 2014-02-02 ENCOUNTER — Other Ambulatory Visit: Payer: Self-pay

## 2014-02-02 ENCOUNTER — Telehealth: Payer: Self-pay | Admitting: Hematology and Oncology

## 2014-02-02 ENCOUNTER — Telehealth: Payer: Self-pay | Admitting: Medical Oncology

## 2014-02-02 ENCOUNTER — Other Ambulatory Visit: Payer: Self-pay | Admitting: Family Medicine

## 2014-02-02 ENCOUNTER — Ambulatory Visit (HOSPITAL_BASED_OUTPATIENT_CLINIC_OR_DEPARTMENT_OTHER): Payer: Self-pay | Admitting: Hematology and Oncology

## 2014-02-02 ENCOUNTER — Telehealth: Payer: Self-pay

## 2014-02-02 ENCOUNTER — Ambulatory Visit (HOSPITAL_BASED_OUTPATIENT_CLINIC_OR_DEPARTMENT_OTHER): Payer: Self-pay

## 2014-02-02 VITALS — BP 138/78 | HR 66 | Temp 97.7°F | Resp 18

## 2014-02-02 VITALS — BP 145/81 | HR 70 | Temp 97.9°F | Resp 20 | Ht 64.0 in | Wt 171.2 lb

## 2014-02-02 DIAGNOSIS — C50419 Malignant neoplasm of upper-outer quadrant of unspecified female breast: Secondary | ICD-10-CM

## 2014-02-02 DIAGNOSIS — B3781 Candidal esophagitis: Secondary | ICD-10-CM

## 2014-02-02 DIAGNOSIS — R197 Diarrhea, unspecified: Secondary | ICD-10-CM

## 2014-02-02 DIAGNOSIS — B370 Candidal stomatitis: Secondary | ICD-10-CM | POA: Insufficient documentation

## 2014-02-02 DIAGNOSIS — Z170 Estrogen receptor positive status [ER+]: Secondary | ICD-10-CM

## 2014-02-02 DIAGNOSIS — K521 Toxic gastroenteritis and colitis: Secondary | ICD-10-CM

## 2014-02-02 DIAGNOSIS — E860 Dehydration: Secondary | ICD-10-CM

## 2014-02-02 DIAGNOSIS — C50411 Malignant neoplasm of upper-outer quadrant of right female breast: Secondary | ICD-10-CM

## 2014-02-02 LAB — CBC WITH DIFFERENTIAL/PLATELET
BASO%: 0.4 % (ref 0.0–2.0)
Basophils Absolute: 0 10*3/uL (ref 0.0–0.1)
EOS%: 0.9 % (ref 0.0–7.0)
Eosinophils Absolute: 0.1 10*3/uL (ref 0.0–0.5)
HCT: 46 % (ref 34.8–46.6)
HGB: 15 g/dL (ref 11.6–15.9)
LYMPH#: 3.3 10*3/uL (ref 0.9–3.3)
LYMPH%: 29.7 % (ref 14.0–49.7)
MCH: 29.5 pg (ref 25.1–34.0)
MCHC: 32.6 g/dL (ref 31.5–36.0)
MCV: 90.3 fL (ref 79.5–101.0)
MONO#: 0.2 10*3/uL (ref 0.1–0.9)
MONO%: 1.5 % (ref 0.0–14.0)
NEUT#: 7.5 10*3/uL — ABNORMAL HIGH (ref 1.5–6.5)
NEUT%: 67.5 % (ref 38.4–76.8)
Platelets: 169 10*3/uL (ref 145–400)
RBC: 5.09 10*6/uL (ref 3.70–5.45)
RDW: 13.5 % (ref 11.2–14.5)
WBC: 11.2 10*3/uL — ABNORMAL HIGH (ref 3.9–10.3)

## 2014-02-02 LAB — COMPREHENSIVE METABOLIC PANEL (CC13)
ALBUMIN: 3.9 g/dL (ref 3.5–5.0)
ALT: 24 U/L (ref 0–55)
AST: 24 U/L (ref 5–34)
Alkaline Phosphatase: 138 U/L (ref 40–150)
Anion Gap: 10 mEq/L (ref 3–11)
BUN: 11.1 mg/dL (ref 7.0–26.0)
CHLORIDE: 106 meq/L (ref 98–109)
CO2: 22 mEq/L (ref 22–29)
Calcium: 8.7 mg/dL (ref 8.4–10.4)
Creatinine: 0.7 mg/dL (ref 0.6–1.1)
Glucose: 86 mg/dl (ref 70–140)
POTASSIUM: 4 meq/L (ref 3.5–5.1)
SODIUM: 138 meq/L (ref 136–145)
Total Bilirubin: 0.83 mg/dL (ref 0.20–1.20)
Total Protein: 7.4 g/dL (ref 6.4–8.3)

## 2014-02-02 LAB — MAGNESIUM (CC13): Magnesium: 2.1 mg/dl (ref 1.5–2.5)

## 2014-02-02 MED ORDER — SODIUM CHLORIDE 0.9 % IJ SOLN
10.0000 mL | INTRAMUSCULAR | Status: DC | PRN
  Administered 2014-02-02: 10 mL via INTRAVENOUS
  Filled 2014-02-02: qty 10

## 2014-02-02 MED ORDER — NYSTATIN 100000 UNIT/ML MT SUSP
5.0000 mL | Freq: Four times a day (QID) | OROMUCOSAL | Status: DC
Start: 2014-02-02 — End: 2014-02-04

## 2014-02-02 MED ORDER — SODIUM CHLORIDE 0.9 % IV SOLN
INTRAVENOUS | Status: DC
  Administered 2014-02-02: 11:00:00 via INTRAVENOUS
  Filled 2014-02-02: qty 1000

## 2014-02-02 MED ORDER — HEPARIN SOD (PORK) LOCK FLUSH 100 UNIT/ML IV SOLN
500.0000 [IU] | Freq: Once | INTRAVENOUS | Status: AC
  Administered 2014-02-02: 500 [IU] via INTRAVENOUS
  Filled 2014-02-02: qty 5

## 2014-02-02 MED ORDER — NYSTATIN 100000 UNIT/ML MT SUSP: 5.0000 mL | Freq: Four times a day (QID) | OROMUCOSAL | Status: DC

## 2014-02-02 NOTE — Progress Notes (Signed)
Triage Call Report rcvd from [facility].  Pt called in 02/02/14 7:50 am.  Sent to scan.

## 2014-02-02 NOTE — Patient Instructions (Signed)

## 2014-02-02 NOTE — Assessment & Plan Note (Signed)
Oral thrush: I prescribed her nystatin swish and swallow.

## 2014-02-02 NOTE — Telephone Encounter (Signed)
Patient requesting a refill on her "Zoloft" medication. Per patient she only has 1 pill left. Patient aware she is do for a follow up and states she will come in one day after work to our walk in clinic. Patient states she is unable to set up an appointment at this time because she is doing chemo for breast cancer. Patient requesting a 1 month supply until she can come in to be seen. Please send to [facility]. Patient's call back number is [phone]

## 2014-02-02 NOTE — Progress Notes (Signed)
Patient Care Team: [name] Mclean, MD as PCP - General (Family Medicine) Rulon Eisenmenger, MD as Consulting Physician (Hematology and Oncology) [name], MD as Consulting Physician (General Surgery) Thea Silversmith, MD as Consulting Physician (Radiation Oncology)  DIAGNOSIS: Breast cancer of upper-outer quadrant of right female breast   Primary site: Breast (Right)   Staging method: AJCC 7th Edition   Clinical: Stage IIA (T2, N0, cM0)   Summary: Stage IIA (T2, N0, cM0)   Clinical comments: Staged at breast conference 12/29/13.  SUMMARY OF ONCOLOGIC HISTORY:   Breast cancer of upper-outer quadrant of right female breast   12/16/2013 Mammogram 1. A palpable 4.1 cm diameter mass/asymmetry with associated coarse heterogeneous microcalcifications at 11 o'clock 2 cm from the right nipple demonstrates mammographic and sonographic features highly suggestive of malignancy.    12/16/2013 Initial Biopsy Breast cancer of upper-outer quadrant of right female breast; Invasive Ductal Cancer Er 100%, PR: 0%; Her2 Positive Ratio 4.28: Ki 67:24%;    12/23/2013 Breast MRI Right Breast 4.4 x 3 x 2.5 cm and a sub cm satellite lesion. No LN   01/28/2014 -  Neo-Adjuvant Chemotherapy Taxotere, carboplatin, Herceptin, Perjeta every 3 weeks x6 cycles    CHIEF COMPLIANT: Diarrhea since she received chemotherapy  INTERVAL HISTORY: [name] is a 58 year old Caucasian lady with above-mentioned history of right-sided breast cancer. She is being started on neoadjuvant chemotherapy with TCH-P. She developed profound diarrhea 3 days after starting chemotherapy. She does not know if she picked up a bug from the colleague in her work place but it is related to chemotherapy. The diarrhea started at 1 AM last night and it lasted for about 7 hours and she called in this morning and we asked her to come back to the clinic. She reports of the diarrhea just stopped about an hour ago. She just feels weak and tired because of all  this diarrhea. She has sore buttock from the diarrhea. She denies any fevers or chills. Mild nausea mainly because of all this diarrhea. She has taken Zofran which has helped somewhat. She is trying to drink as much water as she can but she feels very thirsty in spite of that. She also complains of sore throat.   REVIEW OF SYSTEMS:   Constitutional: Denies fevers, chills or abnormal weight loss Eyes: Denies blurriness of vision Ears, nose, mouth, throat, and face: Throat shows evidence of mild thrush. Respiratory: Denies cough, dyspnea or wheezes Cardiovascular: Denies palpitation, chest discomfort or lower extremity swelling Gastrointestinal:  Diarrhea as stated above Skin: Denies abnormal skin rashes Lymphatics: Denies new lymphadenopathy or easy bruising Neurological:Denies numbness, tingling or new weaknesses Behavioral/Psych: Mood is stable, no new changes  All other systems were reviewed with the patient and are negative.  I have reviewed the past medical history, past surgical history, social history and family history with the patient and they are unchanged from previous note.  ALLERGIES:  is allergic to codeine.  MEDICATIONS:  Current Outpatient Prescriptions  Medication Sig Dispense Refill  . ALPRAZolam (XANAX) 0.5 MG tablet Take 1 tablet (0.5 mg total) by mouth 2 (two) times daily as needed for anxiety.  60 tablet  5  . atenolol (TENORMIN) 50 MG tablet TAKE 1 TABLET BY MOUTH 2 TIMES A DAY  60 tablet  12  . dexamethasone (DECADRON) 4 MG tablet Take 2 tablets (8 mg total) by mouth 2 (two) times daily. Start the day before Taxotere. Then again the day after chemo for 3 days.  30 tablet  1  . lidocaine-prilocaine (EMLA) cream Apply 1 application topically as needed. Apply to [facility] a cath site 2 hours prior to chemotherapy appointment  30 g  2  . LORazepam (ATIVAN) 0.5 MG tablet Take 1 tablet (0.5 mg total) by mouth every 6 (six) hours as needed (Nausea or vomiting).  30 tablet  0  .  Multiple Vitamin (MULTIVITAMIN) tablet Take 1 tablet by mouth daily.        . naproxen sodium (ANAPROX) 220 MG tablet Take 220 mg by mouth 2 (two) times daily with a meal.      . nystatin (MYCOSTATIN) 100000 UNIT/ML suspension Take 5 mLs (500,000 Units total) by mouth 4 (four) times daily.  60 mL  0  . omeprazole (PRILOSEC) 20 MG capsule Take 20 mg by mouth every evening.       . ondansetron (ZOFRAN) 8 MG tablet Take 1 tablet (8 mg total) by mouth 2 (two) times daily. Start the day after chemo for 3 days. Then take as needed for nausea or vomiting.  30 tablet  1  . oxyCODONE-acetaminophen (ROXICET) 5-325 MG per tablet Take 1-2 tablets by mouth every 4 (four) hours as needed for severe pain.  30 tablet  0  . prochlorperazine (COMPAZINE) 10 MG tablet Take 1 tablet (10 mg total) by mouth every 6 (six) hours as needed (Nausea or vomiting).  30 tablet  1  . sertraline (ZOLOFT) 50 MG tablet Take 50 mg by mouth at bedtime.       Current Facility-Administered Medications  Medication Dose Route Frequency Provider Last Rate Last Dose  . sodium chloride 0.9 % 1,000 mL with potassium chloride 10 mEq, magnesium sulfate 2 g infusion   Intravenous Continuous Rulon Eisenmenger, MD        PHYSICAL EXAMINATION: ECOG PERFORMANCE STATUS: 1 - Symptomatic but completely ambulatory  Filed Vitals:   02/02/14 1018  BP: 145/81  Pulse: 70  Temp: 97.9 F (36.6 C)  Resp: 20   Filed Weights   02/02/14 1018  Weight: 171 lb 3.2 oz (77.656 kg)    GENERAL:alert, no distress and comfortable SKIN: skin color, texture, turgor are normal, no rashes or significant lesions EYES: normal, Conjunctiva are pink and non-injected, sclera clear OROPHARYNX: Redness in the posterior pharyngeal wall with evidence of mild thrush NECK: supple, thyroid normal size, non-tender, without nodularity LYMPH:  no palpable lymphadenopathy in the cervical, axillary or inguinal LUNGS: clear to auscultation and percussion with normal breathing  effort HEART: regular rate & rhythm and no murmurs and no lower extremity edema ABDOMEN:abdomen soft, non-tender and normal bowel sounds Musculoskeletal:no cyanosis of digits and no clubbing  NEURO: alert & oriented x 3 with fluent speech, no focal motor/sensory deficits  LABORATORY DATA:  I have reviewed the data as listed   Chemistry      Component Value Date/Time   NA 140 01/28/2014 0832   NA 139 04/03/2013 1420   K 4.3 01/28/2014 0832   K 4.5 04/03/2013 1420   CL 104 04/03/2013 1420   CO2 23 01/28/2014 0832   CO2 25 04/03/2013 1420   BUN 8.6 01/28/2014 0832   BUN 15 04/03/2013 1420   CREATININE 0.7 01/28/2014 0832   CREATININE 0.76 04/03/2013 1420   CREATININE 0.65 02/02/2009 1034      Component Value Date/Time   CALCIUM 8.9 01/28/2014 0832   CALCIUM 9.3 04/03/2013 1420   ALKPHOS 110 01/28/2014 0832   ALKPHOS 107 04/03/2013 1420   AST 22 01/28/2014 6789  AST 18 04/03/2013 1420   ALT 18 01/28/2014 0832   ALT 17 04/03/2013 1420   BILITOT 0.26 01/28/2014 0832   BILITOT 0.4 04/03/2013 1420       Lab Results  Component Value Date   WBC 9.8 01/28/2014   HGB 14.9 01/28/2014   HCT 45.5 01/28/2014   MCV 90.0 01/28/2014   PLT 243 01/28/2014   NEUTROABS 6.0 01/28/2014     RADIOGRAPHIC STUDIES: I have personally reviewed the radiology reports and agreed with their findings. No results found.   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 Receive cycle 1 of chemotherapy last Friday. We will check CBC CMP today along with magnesium.    Diarrhea due to drug Diarrhea for the past 7 hours: It could be related to Perjeta versus viral gastroenteritis. We will give her 1 L of normal saline with 10 of potassium and 2 g of magnesium today. I provided her instructions on how to take Imodium. She will call us if  the diarrhea does not subside. I encouraged her to continue to drink water or Gatorade to get fluids electrolytes.  Thrush of mouth and esophagus Oral thrush: I prescribed her nystatin swish and swallow.    Orders Placed This Encounter  Procedures  . CBC with Differential    Standing Status: Future     Number of Occurrences:      Standing Expiration Date: 02/02/2015  . Comprehensive metabolic panel (Cmet) - [facility]    Standing Status: Future     Number of Occurrences:      Standing Expiration Date: 02/02/2015  . Magnesium - [facility]    Standing Status: Future     Number of Occurrences:      Standing Expiration Date: 02/02/2015   The patient has a good understanding of the overall plan. she agrees with it. She will call with any problems that may develop before her next visit here.  I spent 30 minutes counseling the patient face to face. The total time spent in the appointment was 40 minutes and more than 50% was on counseling and review of test results    Rulon Eisenmenger, MD 02/02/2014 10:43 AM

## 2014-02-02 NOTE — Assessment & Plan Note (Signed)
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 Receive cycle 1 of chemotherapy last Friday. We will check CBC CMP today along with magnesium.

## 2014-02-02 NOTE — Telephone Encounter (Signed)
, °

## 2014-02-02 NOTE — Telephone Encounter (Signed)
Nurse call to treatment room this am reporting patient called reporting to have had "seveer diarrhea x 20 + times between 1am - 8am" reports "bottom raw", headache, sore throat, runny nose, denies fever.  Patient received first time tx 09/11  With herceptin, perjeta, carbo and taxotere, with neulasta @ 09/12.  MD informed.

## 2014-02-02 NOTE — Telephone Encounter (Signed)
Good until yesterday.  Sore throat, cough and cold.  Headache. 1 am - violent diarrhea - cramping - approximately 20 times between 1 and 8 am.  Denies n/v.  People at her work are sick.  Denies temp.  Let pt know I would speak with Dr Lindi Adie and let her know. Per Dr. Lindi Adie - bring her in.   Let pt know Dr Lindi Adie wants to see her - we will work her in.  Patient is on her way.

## 2014-02-02 NOTE — Assessment & Plan Note (Signed)
Diarrhea for the past 7 hours: It could be related to Perjeta versus viral gastroenteritis. We will give her 1 L of normal saline with 10 of potassium and 2 g of magnesium today. I provided her instructions on how to take Imodium. She will call us if the diarrhea does not subside. I encouraged her to continue to drink water or Gatorade to get fluids electrolytes.

## 2014-02-02 NOTE — Telephone Encounter (Signed)
per pof to sch pt sch-sent MW emailt osch appt

## 2014-02-03 ENCOUNTER — Other Ambulatory Visit: Payer: No Typology Code available for payment source

## 2014-02-03 ENCOUNTER — Telehealth: Payer: Self-pay | Admitting: *Deleted

## 2014-02-03 ENCOUNTER — Ambulatory Visit: Payer: No Typology Code available for payment source

## 2014-02-03 NOTE — Telephone Encounter (Signed)
Per staff message and POF I have scheduled appts. Advised scheduler of appts. JMW  

## 2014-02-03 NOTE — Telephone Encounter (Signed)
Pt needs a refill of zoloft please!

## 2014-02-04 ENCOUNTER — Ambulatory Visit: Payer: Medicaid Other

## 2014-02-04 ENCOUNTER — Ambulatory Visit: Payer: Self-pay | Admitting: Adult Health

## 2014-02-04 ENCOUNTER — Other Ambulatory Visit: Payer: Self-pay

## 2014-02-04 ENCOUNTER — Other Ambulatory Visit: Payer: Medicaid Other

## 2014-02-04 DIAGNOSIS — B370 Candidal stomatitis: Secondary | ICD-10-CM

## 2014-02-04 DIAGNOSIS — B3781 Candidal esophagitis: Principal | ICD-10-CM

## 2014-02-04 MED ORDER — NYSTATIN 100000 UNIT/ML MT SUSP: 5.0000 mL | Freq: Four times a day (QID) | OROMUCOSAL | Status: DC

## 2014-02-04 NOTE — Telephone Encounter (Signed)
Returned pt call - requesting refill on [employer].  Per Dr Lindi Adie - ok to refill.  Scrip sent to [facility] - let pt know that receipt was confirmed by pharm.  Pt voiced understanding.

## 2014-02-10 ENCOUNTER — Encounter: Payer: Self-pay | Admitting: Hematology and Oncology

## 2014-02-10 NOTE — Progress Notes (Signed)
Patient left message to call her about funds and what they will pay. I called her back and had to leave a message.

## 2014-02-10 NOTE — Progress Notes (Signed)
Per DSS. Ms [name], no application on file for patient. She does have [facility]. Will confirm if she has applied for regular medicaid.

## 2014-02-11 ENCOUNTER — Other Ambulatory Visit: Payer: Self-pay | Admitting: Hematology and Oncology

## 2014-02-15 ENCOUNTER — Other Ambulatory Visit: Payer: Self-pay | Admitting: *Deleted

## 2014-02-15 ENCOUNTER — Encounter: Payer: Self-pay | Admitting: Hematology and Oncology

## 2014-02-15 ENCOUNTER — Other Ambulatory Visit: Payer: Self-pay

## 2014-02-15 DIAGNOSIS — C50411 Malignant neoplasm of upper-outer quadrant of right female breast: Secondary | ICD-10-CM

## 2014-02-15 MED ORDER — FLUCONAZOLE 100 MG PO TABS: 100.0000 mg | ORAL_TABLET | Freq: Every day | ORAL | Status: DC

## 2014-02-15 MED ORDER — LORAZEPAM 0.5 MG PO TABS: 0.5000 mg | ORAL_TABLET | Freq: Four times a day (QID) | ORAL | Status: DC | PRN

## 2014-02-15 NOTE — Progress Notes (Signed)
Returned call to pt - per chart - Ativan is refilled.  No refill on mouthwash - order for diflucan called in to pharmacy.  Pt will need to discuss percocet with MD when she is in on Friday.  Pt voiced understanding.

## 2014-02-15 NOTE — Progress Notes (Signed)
Left message for patient again.

## 2014-02-17 ENCOUNTER — Other Ambulatory Visit: Payer: Self-pay | Admitting: *Deleted

## 2014-02-17 DIAGNOSIS — B3781 Candidal esophagitis: Principal | ICD-10-CM

## 2014-02-17 DIAGNOSIS — B370 Candidal stomatitis: Secondary | ICD-10-CM

## 2014-02-17 MED ORDER — NYSTATIN 100000 UNIT/ML MT SUSP: 5.0000 mL | Freq: Four times a day (QID) | OROMUCOSAL | Status: DC

## 2014-02-18 ENCOUNTER — Telehealth: Payer: Self-pay | Admitting: Hematology and Oncology

## 2014-02-18 ENCOUNTER — Ambulatory Visit (HOSPITAL_BASED_OUTPATIENT_CLINIC_OR_DEPARTMENT_OTHER): Payer: Self-pay | Admitting: Hematology and Oncology

## 2014-02-18 ENCOUNTER — Other Ambulatory Visit (HOSPITAL_BASED_OUTPATIENT_CLINIC_OR_DEPARTMENT_OTHER): Payer: Self-pay

## 2014-02-18 ENCOUNTER — Ambulatory Visit (HOSPITAL_BASED_OUTPATIENT_CLINIC_OR_DEPARTMENT_OTHER): Payer: Self-pay

## 2014-02-18 ENCOUNTER — Other Ambulatory Visit: Payer: Self-pay

## 2014-02-18 VITALS — BP 157/93 | HR 71 | Temp 98.8°F | Resp 18 | Ht 64.0 in | Wt 173.8 lb

## 2014-02-18 DIAGNOSIS — Z5112 Encounter for antineoplastic immunotherapy: Secondary | ICD-10-CM

## 2014-02-18 DIAGNOSIS — C50412 Malignant neoplasm of upper-outer quadrant of left female breast: Secondary | ICD-10-CM

## 2014-02-18 DIAGNOSIS — C50411 Malignant neoplasm of upper-outer quadrant of right female breast: Secondary | ICD-10-CM

## 2014-02-18 DIAGNOSIS — R197 Diarrhea, unspecified: Secondary | ICD-10-CM

## 2014-02-18 DIAGNOSIS — R439 Unspecified disturbances of smell and taste: Secondary | ICD-10-CM

## 2014-02-18 LAB — CBC WITH DIFFERENTIAL/PLATELET
BASO%: 0.2 % (ref 0.0–2.0)
Basophils Absolute: 0 10*3/uL (ref 0.0–0.1)
EOS ABS: 0 10*3/uL (ref 0.0–0.5)
EOS%: 0.2 % (ref 0.0–7.0)
HCT: 40.4 % (ref 34.8–46.6)
HGB: 13.4 g/dL (ref 11.6–15.9)
LYMPH%: 12.2 % — AB (ref 14.0–49.7)
MCH: 29.6 pg (ref 25.1–34.0)
MCHC: 33.2 g/dL (ref 31.5–36.0)
MCV: 89.2 fL (ref 79.5–101.0)
MONO#: 0.6 10*3/uL (ref 0.1–0.9)
MONO%: 3.4 % (ref 0.0–14.0)
NEUT#: 15.7 10*3/uL — ABNORMAL HIGH (ref 1.5–6.5)
NEUT%: 84 % — ABNORMAL HIGH (ref 38.4–76.8)
PLATELETS: 271 10*3/uL (ref 145–400)
RBC: 4.53 10*6/uL (ref 3.70–5.45)
RDW: 14 % (ref 11.2–14.5)
WBC: 18.7 10*3/uL — ABNORMAL HIGH (ref 3.9–10.3)
lymph#: 2.3 10*3/uL (ref 0.9–3.3)

## 2014-02-18 LAB — COMPREHENSIVE METABOLIC PANEL (CC13)
ALK PHOS: 91 U/L (ref 40–150)
ALT: 18 U/L (ref 0–55)
AST: 11 U/L (ref 5–34)
Albumin: 3.6 g/dL (ref 3.5–5.0)
Anion Gap: 9 mEq/L (ref 3–11)
BILIRUBIN TOTAL: 0.51 mg/dL (ref 0.20–1.20)
BUN: 9.4 mg/dL (ref 7.0–26.0)
CO2: 23 mEq/L (ref 22–29)
Calcium: 8.5 mg/dL (ref 8.4–10.4)
Chloride: 102 mEq/L (ref 98–109)
Creatinine: 0.7 mg/dL (ref 0.6–1.1)
GLUCOSE: 103 mg/dL (ref 70–140)
Potassium: 4.3 mEq/L (ref 3.5–5.1)
Sodium: 134 mEq/L — ABNORMAL LOW (ref 136–145)
TOTAL PROTEIN: 6.3 g/dL — AB (ref 6.4–8.3)

## 2014-02-18 MED ORDER — ACETAMINOPHEN 325 MG PO TABS
ORAL_TABLET | ORAL | Status: AC
  Filled 2014-02-18: qty 2

## 2014-02-18 MED ORDER — SODIUM CHLORIDE 0.9 % IV SOLN
420.0000 mg | Freq: Once | INTRAVENOUS | Status: AC
  Administered 2014-02-18: 420 mg via INTRAVENOUS
  Filled 2014-02-18: qty 14

## 2014-02-18 MED ORDER — DIPHENHYDRAMINE HCL 25 MG PO CAPS
ORAL_CAPSULE | ORAL | Status: AC
  Filled 2014-02-18: qty 1

## 2014-02-18 MED ORDER — DEXAMETHASONE SODIUM PHOSPHATE 20 MG/5ML IJ SOLN
20.0000 mg | Freq: Once | INTRAMUSCULAR | Status: AC
  Administered 2014-02-18: 20 mg via INTRAVENOUS

## 2014-02-18 MED ORDER — OXYCODONE-ACETAMINOPHEN 5-325 MG PO TABS: 1.0000 | ORAL_TABLET | ORAL | Status: DC | PRN

## 2014-02-18 MED ORDER — ONDANSETRON 16 MG/50ML IVPB (CHCC)
INTRAVENOUS | Status: AC
  Filled 2014-02-18: qty 16

## 2014-02-18 MED ORDER — DIPHENHYDRAMINE HCL 25 MG PO CAPS
50.0000 mg | ORAL_CAPSULE | Freq: Once | ORAL | Status: AC
  Administered 2014-02-18: 50 mg via ORAL

## 2014-02-18 MED ORDER — ONDANSETRON 16 MG/50ML IVPB (CHCC)
16.0000 mg | Freq: Once | INTRAVENOUS | Status: AC
  Administered 2014-02-18: 16 mg via INTRAVENOUS

## 2014-02-18 MED ORDER — DEXAMETHASONE SODIUM PHOSPHATE 20 MG/5ML IJ SOLN
INTRAMUSCULAR | Status: AC
  Filled 2014-02-18: qty 5

## 2014-02-18 MED ORDER — DOCETAXEL CHEMO INJECTION 160 MG/16ML
75.0000 mg/m2 | Freq: Once | INTRAVENOUS | Status: AC
  Administered 2014-02-18: 140 mg via INTRAVENOUS
  Filled 2014-02-18: qty 14

## 2014-02-18 MED ORDER — ACETAMINOPHEN 325 MG PO TABS
650.0000 mg | ORAL_TABLET | Freq: Once | ORAL | Status: AC
  Administered 2014-02-18: 650 mg via ORAL

## 2014-02-18 MED ORDER — TRASTUZUMAB CHEMO INJECTION 440 MG
6.0000 mg/kg | Freq: Once | INTRAVENOUS | Status: AC
  Administered 2014-02-18: 462 mg via INTRAVENOUS
  Filled 2014-02-18: qty 22

## 2014-02-18 MED ORDER — SODIUM CHLORIDE 0.9 % IJ SOLN
10.0000 mL | INTRAMUSCULAR | Status: DC | PRN
  Administered 2014-02-18: 10 mL
  Filled 2014-02-18: qty 10

## 2014-02-18 MED ORDER — HEPARIN SOD (PORK) LOCK FLUSH 100 UNIT/ML IV SOLN
500.0000 [IU] | Freq: Once | INTRAVENOUS | Status: AC | PRN
  Administered 2014-02-18: 500 [IU]
  Filled 2014-02-18: qty 5

## 2014-02-18 MED ORDER — SODIUM CHLORIDE 0.9 % IV SOLN
Freq: Once | INTRAVENOUS | Status: AC
  Administered 2014-02-18: 10:00:00 via INTRAVENOUS

## 2014-02-18 MED ORDER — SODIUM CHLORIDE 0.9 % IV SOLN
700.0000 mg | Freq: Once | INTRAVENOUS | Status: AC
  Administered 2014-02-18: 700 mg via INTRAVENOUS
  Filled 2014-02-18: qty 70

## 2014-02-18 NOTE — Assessment & Plan Note (Signed)
1. 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  Currently on neoadjuvant chemotherapy with Taxotere, carboplatin, trastuzumab, Pertuzumab once every 3 weeks. Today is cycle 2 day 1.  2. chemotherapy toxicities: Diarrhea: Started D5 of chemotherapy most likely related to Pertuzumab. Lasted 4-5 days. Instructed her to take prophylactic Imodium starting D5 1 tablet in the morning.  Insomnia: Patient was taking dexamethasone for the entire time over the past 3 weeks instead of the first 3 days after chemotherapy. I instructed her to take 2 tablets of Ativan for the first 3 days after chemotherapy at bedtime. After that she can take 1 tablet as needed at bedtime. I also instructed her to stop taking Decadron after the first 3 days after chemotherapy.  Thrush: I suspect this is also because of taking too much Decadron. It has completely cleared with oral Diflucan.  Loss of taste: Related to chemotherapy. I instructed her to wine foods that taste better and to continue to increase her protein intake.  Alopecia: Related to chemotherapy. I renewed her prescription for oxycodone.  We will place an appointment in one week for IV fluids if she gets diarrhea as needed. Because the patient works full-time, she like to not come for nadir count check and would like to come on the on the day of chemotherapy which would be in 3 weeks.

## 2014-02-18 NOTE — Progress Notes (Signed)
Patient Care Team: Darreld Mclean, MD as PCP - General (Family Medicine) Rulon Eisenmenger, MD as Consulting Physician (Hematology and Oncology) [name], MD as Consulting Physician (General Surgery) Thea Silversmith, MD as Consulting Physician (Radiation Oncology)  DIAGNOSIS: Breast cancer of upper-outer quadrant of right female breast   Primary site: Breast (Right)   Staging method: AJCC 7th Edition   Clinical: Stage IIA (T2, N0, cM0)   Summary: Stage IIA (T2, N0, cM0)   Clinical comments: Staged at breast conference 12/29/13.   SUMMARY OF ONCOLOGIC HISTORY:   Breast cancer of upper-outer quadrant of right female breast   12/16/2013 Mammogram 1. A palpable 4.1 cm diameter mass/asymmetry with associated coarse heterogeneous microcalcifications at 11 o'clock 2 cm from the right nipple demonstrates mammographic and sonographic features highly suggestive of malignancy.    12/16/2013 Initial Biopsy Breast cancer of upper-outer quadrant of right female breast; Invasive Ductal Cancer Er 100%, PR: 0%; Her2 Positive Ratio 4.28: Ki 67:24%;    12/23/2013 Breast MRI Right Breast 4.4 x 3 x 2.5 cm and a sub cm satellite lesion. No LN   01/28/2014 -  Neo-Adjuvant Chemotherapy Taxotere, carboplatin, Herceptin, Perjeta every 3 weeks x6 cycles    CHIEF COMPLIANT: Patient is here for cycle 2 of chemotherapy  INTERVAL HISTORY: [name] is a 58 year old Caucasian with above-mentioned history of HER-2 positive right breast cancer currently being treated with neoadjuvant chemotherapy with Taxotere carboplatin, trastuzumab and Pertuzumab. After cycle one she had profound diarrhea for 4-5 days. The diarrhea resolved with Imodium. She developed thrush that required nystatin swish and swallow but she continued to have excellent thrush symptoms and finding prescribed Diflucan which completely cleared her symptoms. It appears that she has been taking dexamethasone continuously for the last 3 weeks 8 mg twice a day.  This has clearly made her extremely anxious with difficulty in sleeping and also causing oral thrush. She did not have much nausea or vomiting. Her taste is markedly changed but she is trying different foods especially pudding that taste good.   REVIEW OF SYSTEMS:   Constitutional: Denies fevers, chills or abnormal weight loss, alopecia, loss of taste Eyes: Denies blurriness of vision Ears, nose, mouth, throat, and face: Thrush in the mouth is clear Respiratory: Denies cough, dyspnea or wheezes Cardiovascular: Denies palpitation, chest discomfort or lower extremity swelling Gastrointestinal:  Diarrhea has improved Skin: Denies abnormal skin rashes Lymphatics: Denies new lymphadenopathy or easy bruising Neurological:Denies numbness, tingling or new weaknesses Behavioral/Psych: Mood is stable, no new changes  All other systems were reviewed with the patient and are negative.  I have reviewed the past medical history, past surgical history, social history and family history with the patient and they are unchanged from previous note.  ALLERGIES:  is allergic to codeine.  MEDICATIONS:  Current Outpatient Prescriptions  Medication Sig Dispense Refill  . ALPRAZolam (XANAX) 0.5 MG tablet Take 1 tablet (0.5 mg total) by mouth 2 (two) times daily as needed for anxiety.  60 tablet  5  . atenolol (TENORMIN) 50 MG tablet TAKE 1 TABLET BY MOUTH 2 TIMES A DAY  60 tablet  12  . dexamethasone (DECADRON) 4 MG tablet Take 2 tablets (8 mg total) by mouth 2 (two) times daily. Start the day before Taxotere. Then again the day after chemo for 3 days.  30 tablet  1  . fluconazole (DIFLUCAN) 100 MG tablet Take 1 tablet (100 mg total) by mouth daily.  7 tablet  0  . lidocaine-prilocaine (EMLA) cream  Apply 1 application topically as needed. Apply to [facility] a cath site 2 hours prior to chemotherapy appointment  30 g  2  . LORazepam (ATIVAN) 0.5 MG tablet Take 1 tablet (0.5 mg total) by mouth every 6 (six) hours as  needed (Nausea or vomiting).  30 tablet  0  . Multiple Vitamin (MULTIVITAMIN) tablet Take 1 tablet by mouth daily.        . naproxen sodium (ANAPROX) 220 MG tablet Take 220 mg by mouth 2 (two) times daily with a meal.      . nystatin (MYCOSTATIN) 100000 UNIT/ML suspension Take 5 mLs (500,000 Units total) by mouth 4 (four) times daily.  60 mL  0  . omeprazole (PRILOSEC) 20 MG capsule Take 20 mg by mouth every evening.       . ondansetron (ZOFRAN) 8 MG tablet Take 1 tablet (8 mg total) by mouth 2 (two) times daily. Start the day after chemo for 3 days. Then take as needed for nausea or vomiting.  30 tablet  1  . oxyCODONE-acetaminophen (ROXICET) 5-325 MG per tablet Take 1-2 tablets by mouth every 4 (four) hours as needed for severe pain.  30 tablet  0  . prochlorperazine (COMPAZINE) 10 MG tablet Take 1 tablet (10 mg total) by mouth every 6 (six) hours as needed (Nausea or vomiting).  30 tablet  1  . sertraline (ZOLOFT) 50 MG tablet Take 50 mg by mouth at bedtime.      . sertraline (ZOLOFT) 50 MG tablet TAKE 1 TABLET (50 MG TOTAL) BY MOUTH DAILY.  30 tablet  5   No current facility-administered medications for this visit.    PHYSICAL EXAMINATION: ECOG PERFORMANCE STATUS: 1 - Symptomatic but completely ambulatory  Filed Vitals:   02/18/14 0851  BP: 157/93  Pulse: 71  Temp: 98.8 F (37.1 C)  Resp: 18   Filed Weights   02/18/14 0851  Weight: 173 lb 12.8 oz (78.835 kg)    GENERAL:alert, no distress and comfortable SKIN: skin color, texture, turgor are normal, no rashes or significant lesions EYES: normal, Conjunctiva are pink and non-injected, sclera clear OROPHARYNX:no exudate, no erythema and lips, buccal mucosa, and tongue normal  NECK: supple, thyroid normal size, non-tender, without nodularity LYMPH:  no palpable lymphadenopathy in the cervical, axillary or inguinal LUNGS: clear to auscultation and percussion with normal breathing effort HEART: regular rate & rhythm and no murmurs  and no lower extremity edema ABDOMEN:abdomen soft, non-tender and normal bowel sounds Musculoskeletal:no cyanosis of digits and no clubbing  NEURO: alert & oriented x 3 with fluent speech, no focal motor/sensory deficits  LABORATORY DATA:  I have reviewed the data as listed   Chemistry      Component Value Date/Time   NA 134* 02/18/2014 0814   NA 139 04/03/2013 1420   K 4.3 02/18/2014 0814   K 4.5 04/03/2013 1420   CL 104 04/03/2013 1420   CO2 23 02/18/2014 0814   CO2 25 04/03/2013 1420   BUN 9.4 02/18/2014 0814   BUN 15 04/03/2013 1420   CREATININE 0.7 02/18/2014 0814   CREATININE 0.76 04/03/2013 1420   CREATININE 0.65 02/02/2009 1034      Component Value Date/Time   CALCIUM 8.5 02/18/2014 0814   CALCIUM 9.3 04/03/2013 1420   ALKPHOS 91 02/18/2014 0814   ALKPHOS 107 04/03/2013 1420   AST 11 02/18/2014 0814   AST 18 04/03/2013 1420   ALT 18 02/18/2014 0814   ALT 17 04/03/2013 1420   BILITOT 0.51  02/18/2014 0814   BILITOT 0.4 04/03/2013 1420       Lab Results  Component Value Date   WBC 18.7* 02/18/2014   HGB 13.4 02/18/2014   HCT 40.4 02/18/2014   MCV 89.2 02/18/2014   PLT 271 02/18/2014   NEUTROABS 15.7* 02/18/2014     RADIOGRAPHIC STUDIES: I have personally reviewed the radiology reports and agreed with their findings. No results found.   ASSESSMENT & PLAN:  Breast cancer of upper-outer quadrant of right female breast 1. 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  Currently on neoadjuvant chemotherapy with Taxotere, carboplatin, trastuzumab, Pertuzumab once every 3 weeks. Today is cycle 2 day 1.  2. chemotherapy toxicities: Diarrhea: Started D5 of chemotherapy most likely related to Pertuzumab. Lasted 4-5 days. Instructed her to take prophylactic Imodium  starting D5 1 tablet in the morning.  Insomnia: Patient was taking dexamethasone for the entire time over the past 3 weeks instead of the first 3 days after chemotherapy. I instructed her to take 2 tablets of Ativan for the first 3 days after chemotherapy at bedtime. After that she can take 1 tablet as needed at bedtime. I also instructed her to stop taking Decadron after the first 3 days after chemotherapy.  Thrush: I suspect this is also because of taking too much Decadron. It has completely cleared with oral Diflucan.  Loss of taste: Related to chemotherapy. I instructed her to wine foods that taste better and to continue to increase her protein intake.  Alopecia: Related to chemotherapy. I renewed her prescription for oxycodone.  We will place an appointment in one week for IV fluids if she gets diarrhea as needed. Because the patient works full-time, she like to not come for nadir count check and would like to come on the on the day of chemotherapy which would be in 3 weeks.   No orders of the defined types were placed in this encounter.   The patient has a good understanding of the overall plan. she agrees with it. She will call with any problems that may develop before her next visit here.  I spent 25 minutes counseling the patient face to face. The total time spent in the appointment was 30 minutes and more than 50% was on counseling and review of test results    Rulon Eisenmenger, MD 02/18/2014 9:35 AM

## 2014-02-18 NOTE — Telephone Encounter (Signed)
, °

## 2014-02-18 NOTE — Patient Instructions (Addendum)
[name] Discharge Instructions for Patients Receiving Chemotherapy  Today you received the following chemotherapy agents Taxotere, Herceptin, Perjeta and Carboplatin  To help prevent nausea and vomiting after your treatment, we encourage you to take your nausea medication as prescribed.   If you develop nausea and vomiting that is not controlled by your nausea medication, call the clinic.   BELOW ARE SYMPTOMS THAT SHOULD BE REPORTED IMMEDIATELY:  *FEVER GREATER THAN 100.5 F  *CHILLS WITH OR WITHOUT FEVER  NAUSEA AND VOMITING THAT IS NOT CONTROLLED WITH YOUR NAUSEA MEDICATION  *UNUSUAL SHORTNESS OF BREATH  *UNUSUAL BRUISING OR BLEEDING  TENDERNESS IN MOUTH AND THROAT WITH OR WITHOUT PRESENCE OF ULCERS  *URINARY PROBLEMS  *BOWEL PROBLEMS  UNUSUAL RASH Items with * indicate a potential emergency and should be followed up as soon as possible.  Feel free to call the clinic you have any questions or concerns. The clinic phone number is [phone].

## 2014-02-19 ENCOUNTER — Ambulatory Visit (HOSPITAL_BASED_OUTPATIENT_CLINIC_OR_DEPARTMENT_OTHER): Payer: Self-pay

## 2014-02-19 VITALS — BP 153/75 | HR 72 | Temp 98.4°F

## 2014-02-19 DIAGNOSIS — C50411 Malignant neoplasm of upper-outer quadrant of right female breast: Secondary | ICD-10-CM

## 2014-02-19 DIAGNOSIS — C50412 Malignant neoplasm of upper-outer quadrant of left female breast: Secondary | ICD-10-CM

## 2014-02-19 DIAGNOSIS — Z5189 Encounter for other specified aftercare: Secondary | ICD-10-CM

## 2014-02-19 MED ORDER — PEGFILGRASTIM INJECTION 6 MG/0.6ML
6.0000 mg | Freq: Once | SUBCUTANEOUS | Status: AC
  Administered 2014-02-19: 6 mg via SUBCUTANEOUS

## 2014-02-19 NOTE — Patient Instructions (Signed)
Pegfilgrastim injection What is this medicine? PEGFILGRASTIM (peg fil GRA stim) is a long-acting granulocyte colony-stimulating factor that stimulates the growth of neutrophils, a type of white blood cell important in the body's fight against infection. It is used to reduce the incidence of fever and infection in patients with certain types of cancer who are receiving chemotherapy that affects the bone marrow. This medicine may be used for other purposes; ask your health care provider or pharmacist if you have questions. COMMON BRAND NAME(S): Neulasta What should I tell my health care provider before I take this medicine? They need to know if you have any of these conditions: -latex allergy -ongoing radiation therapy -sickle cell disease -skin reactions to acrylic adhesives (On-Body Injector only) -an unusual or allergic reaction to pegfilgrastim, filgrastim, other medicines, foods, dyes, or preservatives -pregnant or trying to get pregnant -breast-feeding How should I use this medicine? This medicine is for injection under the skin. If you get this medicine at home, you will be taught how to prepare and give the pre-filled syringe or how to use the On-body Injector. Refer to the patient Instructions for Use for detailed instructions. Use exactly as directed. Take your medicine at regular intervals. Do not take your medicine more often than directed. It is important that you put your used needles and syringes in a special sharps container. Do not put them in a trash can. If you do not have a sharps container, call your pharmacist or healthcare provider to get one. Talk to your pediatrician regarding the use of this medicine in children. Special care may be needed. Overdosage: If you think you have taken too much of this medicine contact a poison control center or emergency room at once. NOTE: This medicine is only for you. Do not share this medicine with others. What if I miss a dose? It is  important not to miss your dose. Call your doctor or health care professional if you miss your dose. If you miss a dose due to an On-body Injector failure or leakage, a new dose should be administered as soon as possible using a single prefilled syringe for manual use. What may interact with this medicine? Interactions have not been studied. Give your health care provider a list of all the medicines, herbs, non-prescription drugs, or dietary supplements you use. Also tell them if you smoke, drink alcohol, or use illegal drugs. Some items may interact with your medicine. This list may not describe all possible interactions. Give your health care provider a list of all the medicines, herbs, non-prescription drugs, or dietary supplements you use. Also tell them if you smoke, drink alcohol, or use illegal drugs. Some items may interact with your medicine. What should I watch for while using this medicine? You may need blood work done while you are taking this medicine. If you are going to need a MRI, CT scan, or other procedure, tell your doctor that you are using this medicine (On-Body Injector only). What side effects may I notice from receiving this medicine? Side effects that you should report to your doctor or health care professional as soon as possible: -allergic reactions like skin rash, itching or hives, swelling of the face, lips, or tongue -dizziness -fever -pain, redness, or irritation at site where injected -pinpoint red spots on the skin -shortness of breath or breathing problems -stomach or side pain, or pain at the shoulder -swelling -tiredness -trouble passing urine Side effects that usually do not require medical attention (report to your doctor   or health care professional if they continue or are bothersome): -bone pain -muscle pain This list may not describe all possible side effects. Call your doctor for medical advice about side effects. You may report side effects to FDA at  1-800-FDA-1088. Where should I keep my medicine? Keep out of the reach of children. Store pre-filled syringes in a refrigerator between 2 and 8 degrees C (36 and 46 degrees F). Do not freeze. Keep in carton to protect from light. Throw away this medicine if it is left out of the refrigerator for more than 48 hours. Throw away any unused medicine after the expiration date. NOTE: This sheet is a summary. It may not cover all possible information. If you have questions about this medicine, talk to your doctor, pharmacist, or health care provider.  2015, Elsevier/Gold Standard. (2013-08-05 16:14:05)  

## 2014-02-21 ENCOUNTER — Encounter: Payer: Self-pay | Admitting: Hematology and Oncology

## 2014-02-21 ENCOUNTER — Telehealth: Payer: Self-pay | Admitting: *Deleted

## 2014-02-21 ENCOUNTER — Telehealth: Payer: Self-pay

## 2014-02-21 NOTE — Telephone Encounter (Signed)
Returned pt call re: side effects.  Pt reports she woke Saturday night with extreme back and chest pain, palpitations and rash around her waist and in her pubic area.  Pt reports rash is like a diaper rash, no bumps, red and streaky, with a burning sensation.  Pt reports feeling bad Sunday also and worse today with weakness and nausea.  She did not go to work today, "she feels terrible".  Pt denies SOB.  Advised patient to take claritin and Tylenol - verified with Dr Lindi Adie that claritin ok with her tachycardia. If pain pain is very bad, she can take her pain meds.  Advised patient to use benadryl or hydrocortison cream for rash,  Or she can try taking PO benadryl.  Advised patient to call us if anything worsens, or if her rash does not resolve.  Pt voiced understanding.

## 2014-02-21 NOTE — Telephone Encounter (Signed)
Per staff message I have adjusted 10/23.

## 2014-02-21 NOTE — Progress Notes (Signed)
Patient said she told landlord to send me w-9. I will call and see if he can fax it. I have copy of lease. I told her will send original back to her.

## 2014-02-22 ENCOUNTER — Encounter: Payer: Self-pay | Admitting: Hematology and Oncology

## 2014-02-22 NOTE — Progress Notes (Signed)
Called and spoke with landlord and he said he mailed the w-9 to me.

## 2014-02-23 ENCOUNTER — Ambulatory Visit (HOSPITAL_COMMUNITY)
Admission: RE | Admit: 2014-02-23 | Discharge: 2014-02-23 | Disposition: A | Discharge status: Home / Self Care | Payer: Medicaid Other | Source: Ambulatory Visit | Attending: Vascular Surgery | Admitting: Vascular Surgery

## 2014-02-23 ENCOUNTER — Ambulatory Visit (HOSPITAL_BASED_OUTPATIENT_CLINIC_OR_DEPARTMENT_OTHER): Payer: Self-pay

## 2014-02-23 ENCOUNTER — Ambulatory Visit (HOSPITAL_BASED_OUTPATIENT_CLINIC_OR_DEPARTMENT_OTHER): Payer: Medicaid Other

## 2014-02-23 ENCOUNTER — Telehealth: Payer: Self-pay | Admitting: *Deleted

## 2014-02-23 ENCOUNTER — Other Ambulatory Visit: Payer: Self-pay | Admitting: *Deleted

## 2014-02-23 ENCOUNTER — Ambulatory Visit (HOSPITAL_BASED_OUTPATIENT_CLINIC_OR_DEPARTMENT_OTHER): Payer: Self-pay | Admitting: Nurse Practitioner

## 2014-02-23 VITALS — BP 127/77 | HR 63 | Temp 97.8°F | Resp 18 | Ht 64.0 in | Wt 171.3 lb

## 2014-02-23 DIAGNOSIS — Y848 Other medical procedures as the cause of abnormal reaction of the patient, or of later complication, without mention of misadventure at the time of the procedure: Secondary | ICD-10-CM | POA: Diagnosis not present

## 2014-02-23 DIAGNOSIS — E860 Dehydration: Secondary | ICD-10-CM

## 2014-02-23 DIAGNOSIS — T82898A Other specified complication of vascular prosthetic devices, implants and grafts, initial encounter: Secondary | ICD-10-CM | POA: Insufficient documentation

## 2014-02-23 DIAGNOSIS — R600 Localized edema: Secondary | ICD-10-CM

## 2014-02-23 DIAGNOSIS — C50411 Malignant neoplasm of upper-outer quadrant of right female breast: Secondary | ICD-10-CM

## 2014-02-23 DIAGNOSIS — L271 Localized skin eruption due to drugs and medicaments taken internally: Secondary | ICD-10-CM

## 2014-02-23 DIAGNOSIS — C801 Malignant (primary) neoplasm, unspecified: Secondary | ICD-10-CM | POA: Diagnosis not present

## 2014-02-23 LAB — CBC WITH DIFFERENTIAL/PLATELET
BASO%: 0.6 % (ref 0.0–2.0)
Basophils Absolute: 0.1 10*3/uL (ref 0.0–0.1)
EOS%: 0.9 % (ref 0.0–7.0)
Eosinophils Absolute: 0.1 10*3/uL (ref 0.0–0.5)
HEMATOCRIT: 41.2 % (ref 34.8–46.6)
HGB: 14.1 g/dL (ref 11.6–15.9)
LYMPH#: 4 10*3/uL — AB (ref 0.9–3.3)
LYMPH%: 40.8 % (ref 14.0–49.7)
MCH: 30 pg (ref 25.1–34.0)
MCHC: 34.2 g/dL (ref 31.5–36.0)
MCV: 87.7 fL (ref 79.5–101.0)
MONO#: 0.3 10*3/uL (ref 0.1–0.9)
MONO%: 2.8 % (ref 0.0–14.0)
NEUT#: 5.4 10*3/uL (ref 1.5–6.5)
NEUT%: 54.9 % (ref 38.4–76.8)
NRBC: 0 % (ref 0–0)
Platelets: 120 10*3/uL — ABNORMAL LOW (ref 145–400)
RBC: 4.7 10*6/uL (ref 3.70–5.45)
RDW: 13.9 % (ref 11.2–14.5)
WBC: 9.8 10*3/uL (ref 3.9–10.3)

## 2014-02-23 LAB — COMPREHENSIVE METABOLIC PANEL (CC13)
ALK PHOS: 110 U/L (ref 40–150)
ALT: 24 U/L (ref 0–55)
AST: 19 U/L (ref 5–34)
Albumin: 3.6 g/dL (ref 3.5–5.0)
Anion Gap: 6 mEq/L (ref 3–11)
BUN: 9 mg/dL (ref 7.0–26.0)
CO2: 26 mEq/L (ref 22–29)
CREATININE: 0.7 mg/dL (ref 0.6–1.1)
Calcium: 8.5 mg/dL (ref 8.4–10.4)
Chloride: 96 mEq/L — ABNORMAL LOW (ref 98–109)
GLUCOSE: 86 mg/dL (ref 70–140)
Potassium: 4.4 mEq/L (ref 3.5–5.1)
Sodium: 128 mEq/L — ABNORMAL LOW (ref 136–145)
Total Bilirubin: 1.11 mg/dL (ref 0.20–1.20)
Total Protein: 6.1 g/dL — ABNORMAL LOW (ref 6.4–8.3)

## 2014-02-23 LAB — MAGNESIUM (CC13): MAGNESIUM: 2.1 mg/dL (ref 1.5–2.5)

## 2014-02-23 MED ORDER — LORAZEPAM 0.5 MG PO TABS: 0.5000 mg | ORAL_TABLET | Freq: Four times a day (QID) | ORAL | Status: DC | PRN

## 2014-02-23 MED ORDER — SODIUM CHLORIDE 0.9 % IV SOLN
INTRAVENOUS | Status: AC
  Administered 2014-02-23: 12:00:00 via INTRAVENOUS

## 2014-02-23 NOTE — Telephone Encounter (Signed)
Received note from call-a-nurse that patient would like to be seen in office today. Sent to scan.

## 2014-02-23 NOTE — Patient Instructions (Addendum)
Dehydration, Adult Dehydration is when you lose more fluids from the body than you take in. Vital organs like the kidneys, brain, and heart cannot function without a proper amount of fluids and salt. Any loss of fluids from the body can cause dehydration.  CAUSES   Vomiting.  Diarrhea.  Excessive sweating.  Excessive urine output.  Fever. SYMPTOMS  Mild dehydration  Thirst.  Dry lips.  Slightly dry mouth. Moderate dehydration  Very dry mouth.  Sunken eyes.  Skin does not bounce back quickly when lightly pinched and released.  Dark urine and decreased urine production.  Decreased tear production.  Headache. Severe dehydration  Very dry mouth.  Extreme thirst.  Rapid, weak pulse (more than 100 beats per minute at rest).  Cold hands and feet.  Not able to sweat in spite of heat and temperature.  Rapid breathing.  Blue lips.  Confusion and lethargy.  Difficulty being awakened.  Minimal urine production.  No tears. DIAGNOSIS  Your caregiver will diagnose dehydration based on your symptoms and your exam. Blood and urine tests will help confirm the diagnosis. The diagnostic evaluation should also identify the cause of dehydration. TREATMENT  Treatment of mild or moderate dehydration can often be done at home by increasing the amount of fluids that you drink. It is best to drink small amounts of fluid more often. Drinking too much at one time can make vomiting worse. Refer to the home care instructions below. Severe dehydration needs to be treated at the hospital where you will probably be given intravenous (IV) fluids that contain water and electrolytes. HOME CARE INSTRUCTIONS   Ask your caregiver about specific rehydration instructions.  Drink enough fluids to keep your urine clear or pale yellow.  Drink small amounts frequently if you have nausea and vomiting.  Eat as you normally do.  Avoid:  Foods or drinks high in sugar.  Carbonated  drinks.  Juice.  Extremely hot or cold fluids.  Drinks with caffeine.  Fatty, greasy foods.  Alcohol.  Tobacco.  Overeating.  Gelatin desserts.  Wash your hands well to avoid spreading bacteria and viruses.  Only take over-the-counter or prescription medicines for pain, discomfort, or fever as directed by your caregiver.  Ask your caregiver if you should continue all prescribed and over-the-counter medicines.  Keep all follow-up appointments with your caregiver. SEEK MEDICAL CARE IF:  You have abdominal pain and it increases or stays in one area (localizes).  You have a rash, stiff neck, or severe headache.  You are irritable, sleepy, or difficult to awaken.  You are weak, dizzy, or extremely thirsty. SEEK IMMEDIATE MEDICAL CARE IF:   You are unable to keep fluids down or you get worse despite treatment.  You have frequent episodes of vomiting or diarrhea.  You have blood or green matter (bile) in your vomit.  You have blood in your stool or your stool looks black and tarry.  You have not urinated in 6 to 8 hours, or you have only urinated a small amount of very dark urine.  You have a fever.  You faint. MAKE SURE YOU:   Understand these instructions.  Will watch your condition.  Will get help right away if you are not doing well or get worse. Document Released: 05/06/2005 Document Revised: 07/29/2011 Document Reviewed: 12/24/2010 [employer] Patient Information 2015 [employer]. This information is not intended to replace advice given to you by your health care provider. Make sure you discuss any questions you have with your health care   provider.  

## 2014-02-23 NOTE — Telephone Encounter (Signed)
Received notes from call-a-nurse stating that patient would like to be seen since she has been sick since Saturday. Called patient. She states that she is weak and feels terrible. States that she is drinking plenty of fluids and eating. Has some nausea but no vomiting, taking anti-nausea meds. Has had 2-3 episodes of diarrhea that started last night, taking immodium. No fever. Spoke with MD and patient to see Selena Lesser today. Placed orders for labs.

## 2014-02-23 NOTE — Progress Notes (Signed)
*  PRELIMINARY RESULTS* Vascular Ultrasound Left upper extremity venous duplex has been completed.  Preliminary findings: No evidence of deep or superficial thrombosis.  Called results to [employer].  Landry Mellow, RDMS, RVT  02/23/2014, 12:54 PM

## 2014-02-25 ENCOUNTER — Telehealth: Payer: Self-pay

## 2014-02-25 DIAGNOSIS — E860 Dehydration: Secondary | ICD-10-CM | POA: Insufficient documentation

## 2014-02-25 DIAGNOSIS — L271 Localized skin eruption due to drugs and medicaments taken internally: Secondary | ICD-10-CM | POA: Insufficient documentation

## 2014-02-25 DIAGNOSIS — R600 Localized edema: Secondary | ICD-10-CM | POA: Insufficient documentation

## 2014-02-25 NOTE — Assessment & Plan Note (Signed)
Patient symptoms of increased anxiety, insomnia, and overall achiness is most likely secondary to discontinuation of daily dexamethasone the patient was taking inadvertently.  Patient does feel dehydrated today.  Patient was given 1 L normal saline IV fluid rehydration while at the [facility] today.  Also, patient was requesting something to help her sleep.  I patient states that she takes between one and 2 of her lorazepam tablets at night to decrease her anxiety and help her sleep.  Advised patient that she could increase the lorazepam up to 1.5 mg (3 tabs) on occasion for sleeplessness.  Advised   caution with ambulation since lorazepam can make patient more sedated.

## 2014-02-25 NOTE — Assessment & Plan Note (Signed)
Patient has developed some significant hand foot syndrome; secondary to chemotherapy. She has cracks to her bilateral thumb tips; as well as some tiny cracks to her other fingertips.  Patient was advised to keep her hands and feet very well moisturized; wearing cotton gloves and socks to bed after moisturizing.  Also need to protect her hands and feet from extremes in temperature.

## 2014-02-25 NOTE — Assessment & Plan Note (Signed)
Patient received cycle 2 of her neoadjuvant Taxotere/carboplatinum/Herceptin/perjeta chemotherapy regimen on 02/18/2014.  She be due for her next cycle of chemotherapy on 03/11/2014.  Due to patient working a full-time job-patient prefers not to to return for a nadir visit in between chemotherapy days.

## 2014-02-25 NOTE — Telephone Encounter (Signed)
Returned pt call - she reports having diarrhea all night, a cough, red streaks in her throat, difficulty speaking, trouble sleeping.  Also states the rash she has in her groin area smells.  [facility] was able to see her but patient said she did not have transportation to come in today.  Advised patient if symptoms get worse patient should go to ED.  Pt voiced understanding.

## 2014-02-25 NOTE — Progress Notes (Signed)
[place]   Chief Complaint  Patient presents with  . Dehydration    HPI: [name] 58 y.o. female diagnosed with breast cancer.  Currently undergoing neoadjuvant Taxotere/carboplatin/Herceptin/Perjeta chemotherapy regimen.   Patient called the cancer Center today requesting urgent care visit.  She states that she received cycle 2 of her chemotherapy on 02/18/2014.  She inadvertently was taking dexamethasone 8 mg a twice-daily for the past 3 weeks.  She was complaining of increased anxiety, sleeplessness, some facial edema- and only discontinued the dexamethasone last week when she returned for a followup visit here at the [facility].  Since that time-patient has been feeling more achy and fatigued.  She feels that she is dehydrated.  She denies any specific nausea, vomiting, or diarrhea.  She denies any recent fevers or chills.  She does have some significant hand/foot syndrome with cracking of some of her fingertips.  She is also noted some mild edema over her left upper chest Port-A-Cath site.  HPI  CURRENT THERAPY: Upcoming Treatment Dates - BREAST TCH q21d (order Herceptin separately) Days with orders from any treatment category:  03/11/2014      SCHEDULING COMMUNICATION      ondansetron (ZOFRAN) IVPB 16 mg      Dexamethasone Sodium Phosphate (DECADRON) injection 20 mg      DOCEtaxel (TAXOTERE) 140 mg in dextrose 5 % 250 mL chemo infusion      CARBOplatin (PARAPLATIN) 700 mg in sodium chloride 0.9 % 250 mL chemo infusion      sodium chloride 0.9 % injection 10 mL      heparin lock flush 100 unit/mL      heparin lock flush 100 unit/mL      alteplase (CATHFLO ACTIVASE) injection 2 mg      sodium chloride 0.9 % injection 3 mL      Cold Pack 1 packet      diphenhydrAMINE (BENADRYL) injection 25 mg      famotidine (PEPCID) IVPB 20 mg      0.9 %  sodium chloride infusion      methylPREDNISolone sodium succinate (SOLU-MEDROL) 125 mg/2 mL injection 125 mg    EPINEPHrine (ADRENALIN) 0.1 MG/ML injection 0.25 mg      EPINEPHrine (ADRENALIN) 0.1 MG/ML injection 0.25 mg      EPINEPHrine (ADRENALIN) injection 0.5 mg      EPINEPHrine (ADRENALIN) injection 0.5 mg      diphenhydrAMINE (BENADRYL) injection 50 mg      albuterol (PROVENTIL) (2.5 MG/3ML) 0.083% nebulizer solution 2.5 mg      0.9 %  sodium chloride infusion      TREATMENT CONDITIONS 03/12/2014      SCHEDULING COMMUNICATION INJECTION      pegfilgrastim (NEULASTA) injection 6 mg 04/01/2014      SCHEDULING COMMUNICATION      ondansetron (ZOFRAN) IVPB 16 mg      Dexamethasone Sodium Phosphate (DECADRON) injection 20 mg      DOCEtaxel (TAXOTERE) 140 mg in dextrose 5 % 250 mL chemo infusion      CARBOplatin (PARAPLATIN) 700 mg in sodium chloride 0.9 % 250 mL chemo infusion      sodium chloride 0.9 % injection 10 mL      heparin lock flush 100 unit/mL      heparin lock flush 100 unit/mL      alteplase (CATHFLO ACTIVASE) injection 2 mg      sodium chloride 0.9 % injection 3 mL      Cold Pack  1 packet      diphenhydrAMINE (BENADRYL) injection 25 mg      famotidine (PEPCID) IVPB 20 mg      0.9 %  sodium chloride infusion      methylPREDNISolone sodium succinate (SOLU-MEDROL) 125 mg/2 mL injection 125 mg      EPINEPHrine (ADRENALIN) 0.1 MG/ML injection 0.25 mg      EPINEPHrine (ADRENALIN) 0.1 MG/ML injection 0.25 mg      EPINEPHrine (ADRENALIN) injection 0.5 mg      EPINEPHrine (ADRENALIN) injection 0.5 mg      diphenhydrAMINE (BENADRYL) injection 50 mg      albuterol (PROVENTIL) (2.5 MG/3ML) 0.083% nebulizer solution 2.5 mg      0.9 %  sodium chloride infusion      TREATMENT CONDITIONS    ROS  Past Medical History  Diagnosis Date  . 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), hypoglycemia 2/04-resolved with PO calcium + D, SVT, was treated with iterferon and ribavirin-10/3-2/04  . GERD (gastroesophageal  reflux disease)   . PUD (peptic ulcer disease)   . Fibromyalgia   . Rheumatoid arthritis(714.0)     possible  . SVT (supraventricular tachycardia)   . Depression   . Osteoporosis   . Hepatitis C     Pt was given too much interferon - critical care x2d, 5 days total hosp  . Anxiety   . Wears glasses   . Heavy smoker     Past Surgical History  Procedure Laterality Date  . Cesarean section  09/17/80  . Dilation and curettage of uterus    . Hysterectomy-for dysmenorrhea  09/17/00  . Nasal sinus surgery  03/20/84    for deviated septum  . Tonsillectomy  05/20/61  . Abdominal hysterectomy    . Colonoscopy    . Upper gi endoscopy    . Portacath placement Left 01/20/2014    Procedure: INSERTION PORT-A-CATH;  Surgeon: [name], MD;  Location: [facility];  Service: General;  Laterality: Left;    has HEPATITIS B; HEPATITIS C; TOBACCO ABUSE; PSVT; GASTROESOPHAGEAL REFLUX, NO ESOPHAGITIS; FIBROMYALGIA; Rheumatoid arthritis; Breast cancer of upper-outer quadrant of right female breast; Thrush of mouth and esophagus; Diarrhea due to drug; Extremity edema; Dehydration; and Hand foot syndrome on her problem list.     is allergic to codeine.    Medication List       This list is accurate as of: 02/23/14 11:59 PM.  Always use your most recent med list.               ALPRAZolam 0.5 MG tablet  Commonly known as:  XANAX  Take 1 tablet (0.5 mg total) by mouth 2 (two) times daily as needed for anxiety.     atenolol 50 MG tablet  Commonly known as:  TENORMIN  TAKE 1 TABLET BY MOUTH 2 TIMES A DAY     dexamethasone 4 MG tablet  Commonly known as:  DECADRON  Take 2 tablets (8 mg total) by mouth 2 (two) times daily. Start the day before Taxotere. Then again the day after chemo for 3 days.     fluconazole 100 MG tablet  Commonly known as:  DIFLUCAN  Take 1 tablet (100 mg total) by mouth daily.     lidocaine-prilocaine cream  Commonly known as:  EMLA  Apply 1 application  topically as needed. Apply to [facility] a cath site 2 hours prior to chemotherapy appointment  LORazepam 0.5 MG tablet  Commonly known as:  ATIVAN  Take 1-3 tablets (0.5-1.5 mg total) by mouth every 6 (six) hours as needed (do not take when taking Xanax.).     multivitamin tablet  Take 1 tablet by mouth daily.     naproxen sodium 220 MG tablet  Commonly known as:  ANAPROX  Take 220 mg by mouth 2 (two) times daily with a meal.     omeprazole 20 MG capsule  Commonly known as:  PRILOSEC  Take 20 mg by mouth every evening.     ondansetron 8 MG tablet  Commonly known as:  ZOFRAN  Take 1 tablet (8 mg total) by mouth 2 (two) times daily. Start the day after chemo for 3 days. Then take as needed for nausea or vomiting.     oxyCODONE-acetaminophen 5-325 MG per tablet  Commonly known as:  ROXICET  Take 1-2 tablets by mouth every 4 (four) hours as needed for severe pain.     prochlorperazine 10 MG tablet  Commonly known as:  COMPAZINE  Take 1 tablet (10 mg total) by mouth every 6 (six) hours as needed (Nausea or vomiting).     sertraline 50 MG tablet  Commonly known as:  ZOLOFT  Take 50 mg by mouth at bedtime.     sertraline 50 MG tablet  Commonly known as:  ZOLOFT  TAKE 1 TABLET (50 MG TOTAL) BY MOUTH DAILY.         PHYSICAL EXAMINATION  Blood pressure 127/77, pulse 63, temperature 97.8 F (36.6 C), temperature source Oral, resp. rate 18, height 5' 4"  (1.626 m), weight 171 lb 4.8 oz (77.701 kg), SpO2 98.00%.  Physical Exam  Nursing note and vitals reviewed. Constitutional: She is oriented to person, place, and time and well-developed, well-nourished, and in no distress.  HENT:  Head: Normocephalic and atraumatic.  Right Ear: External ear normal.  Left Ear: External ear normal.  Nose: Nose normal.  Mouth/Throat: Oropharynx is clear and moist.  Eyes: Conjunctivae and EOM are normal. Pupils are equal, round, and reactive to light. Right eye exhibits discharge. Left eye exhibits  no discharge. No scleral icterus.  Neck: Normal range of motion. Neck supple. No JVD present. No tracheal deviation present. No thyromegaly present.  Cardiovascular: Normal rate, regular rhythm, normal heart sounds and intact distal pulses.   Pulmonary/Chest: Effort normal. No respiratory distress. She has no wheezes.  Abdominal: Soft. Bowel sounds are normal. She exhibits no distension. There is no tenderness. There is no rebound.  Musculoskeletal: Normal range of motion. She exhibits edema. She exhibits no tenderness.  Left upper chest Port-A-Cath site intact with no evidence of infection or vein distention.  Does have trace edema to left collarbone region which is nontender, with no warmth or erythema.  All pulses are palpable in all extremities are warm.  Lymphadenopathy:    She has no cervical adenopathy.  Neurological: She is alert and oriented to person, place, and time. Gait normal.  Skin: Skin is warm and dry. No rash noted. No erythema.  Patient's bilateral thumb tips have a large cracks in them.  She also has some smaller cracks to her other fingertips as well.  No evidence of active infection to any of these areas.  Psychiatric: Affect normal.    LABORATORY DATA:. CBC  Lab Results  Component Value Date   WBC 9.8 02/23/2014   RBC 4.70 02/23/2014   HGB 14.1 02/23/2014   HCT 41.2 02/23/2014   PLT 120* 02/23/2014  MCV 87.7 02/23/2014   MCH 30.0 02/23/2014   MCHC 34.2 02/23/2014   RDW 13.9 02/23/2014   LYMPHSABS 4.0* 02/23/2014   MONOABS 0.3 02/23/2014   EOSABS 0.1 02/23/2014   BASOSABS 0.1 02/23/2014     CMET  Lab Results  Component Value Date   NA 128* 02/23/2014   K 4.4 02/23/2014   CL 104 04/03/2013   CO2 26 02/23/2014   GLUCOSE 86 02/23/2014   BUN 9.0 02/23/2014   CREATININE 0.7 02/23/2014   CALCIUM 8.5 02/23/2014   PROT 6.1* 02/23/2014   ALBUMIN 3.6 02/23/2014   AST 19 02/23/2014   ALT 24 02/23/2014   ALKPHOS 110 02/23/2014   BILITOT 1.11 02/23/2014   GFRNONAA >60 07/23/2008    GFRAA  Value: >60        The eGFR has been calculated using the MDRD equation. This calculation has not been validated in all clinical situations. eGFR's persistently <60 mL/min signify possible Chronic Kidney Disease. 07/23/2008   Radiology:  Result Narrative      *[facility] [employer]. [place], [place] [id number] [phone]  ------------------------------------------------------------------- Noninvasive Vascular Lab  Left Upper Extremity Venous Duplex Evaluation  Patient: Nguyet, Mercer MR #: [id number] Study Date: 02/23/2014 Gender: F Age: 41 Height: Weight: BSA: Pt. Status: Room:  REFERRING Darreld Mclean SONOGRAPHER Landry Mellow, RVT, RDMS ATTENDING Drue Second [id number], JJHERDE [id number] [place] [id number]  Reports also to:  ------------------------------------------------------------------- History and indications:  History  Diagnostic evaluation. Swelling around Port at clavicle area.  Cancer patient.  ------------------------------------------------------------------- Study information:  Add-on. Study status: Urgent. Procedure: The left internal jugular, left subclavian, left axillary, left basilic, left cephalic, left brachial, left radial, left ulnar, right internal jugular and right subclavian veins were studied. A vascular evaluation was performed with the patient in the supine position. Image quality was adequate. Left upper extremity venous duplex study. Doppler flow study including B-mode compression maneuvers of all visualized segments, color flow Doppler and selected views of pulsed wave Doppler. Birthdate: Patient birthdate: 1955-10-21. Age: Patient is 58 yr old. Sex: Gender: female. Study date: Study date: 02/23/2014. Study time: 12:30 PM. Location: Vascular laboratory. Patient status: Outpatient. Venous  flow:  +---------------------+-------+---------------------------------+ Location OverallFlow properties  +---------------------+-------+---------------------------------+ Left internal jugularPatent Phasic; spontaneous; compressible +---------------------+-------+---------------------------------+ Left subclavian Patent Phasic; spontaneous; compressible +---------------------+-------+---------------------------------+ Left axillary Patent Phasic; spontaneous; compressible +---------------------+-------+---------------------------------+ Left brachial Patent Phasic; spontaneous; compressible +---------------------+-------+---------------------------------+ Left cephalic Patent Compressible  +---------------------+-------+---------------------------------+ Left basilic Patent Compressible  +---------------------+-------+---------------------------------+ Left radial Patent Compressible  +---------------------+-------+---------------------------------+ Left ulnar Patent Compressible  +---------------------+-------+---------------------------------+ Right subclavian Patent Phasic; spontaneous; compressible +---------------------+-------+---------------------------------+  ------------------------------------------------------------------- Summary: No evidence of deep vein or superficial thrombosis involving the left upper extremity and right subclavian vein.  Other specific details can be found in the table(s) above. Prepared and Electronically Authenticated by  Tinnie Gens     ASSESSMENT/PLAN:    Breast cancer of upper-outer quadrant of right female breast  Assessment & Plan Patient received cycle 2 of her neoadjuvant Taxotere/carboplatinum/Herceptin/perjeta chemotherapy regimen on 02/18/2014.  She be due for her next cycle of chemotherapy on 03/11/2014.  Due to patient working a full-time job-patient prefers not to to return for a nadir  visit in between chemotherapy days.   Extremity edema  Assessment & Plan Patient did have some trace edema proximal to her left upper chest Port-A-Cath site.  Area had no erythema, no warmth, and no tenderness.  No vein distention noted.  Doppler ultrasound obtained at to rule  out DVT was negative.  Will continue to monitor closely.   Dehydration  Assessment & Plan Patient symptoms of increased anxiety, insomnia, and overall achiness is most likely secondary to discontinuation of daily dexamethasone the patient was taking inadvertently.  Patient does feel dehydrated today.  Patient was given 1 L normal saline IV fluid rehydration while at the [facility] today.  Also, patient was requesting something to help her sleep.  I patient states that she takes between one and 2 of her lorazepam tablets at night to decrease her anxiety and help her sleep.  Advised patient that she could increase the lorazepam up to 1.5 mg (3 tabs) on occasion for sleeplessness.  Advised   caution with ambulation since lorazepam can make patient more sedated.   Hand foot syndrome  Assessment & Plan Patient has developed some significant hand foot syndrome; secondary to chemotherapy. She has cracks to her bilateral thumb tips; as well as some tiny cracks to her other fingertips.  Patient was advised to keep her hands and feet very well moisturized; wearing cotton gloves and socks to bed after moisturizing.  Also need to protect her hands and feet from extremes in temperature.   Patient stated understanding of all instructions; and was in agreement with this plan of care. The patient knows to call the clinic with any problems, questions or concerns.   Review/collaboration with Dr. Lindi Adie regarding all aspects of patient's visit today.   Total time spent with patient was 40 minutes;  with greater than  75 percent of that time spent in face to face counseling regarding her symptoms, review of her ultrasound results, and  coordination of care and follow up.  Disclaimer: This note was dictated with voice recognition software. Similar sounding words can inadvertently be transcribed and may not be corrected upon review.   Drue Second, NP 02/25/2014

## 2014-02-25 NOTE — Assessment & Plan Note (Signed)
Patient did have some trace edema proximal to her left upper chest Port-A-Cath site.  Area had no erythema, no warmth, and no tenderness.  No vein distention noted.  Doppler ultrasound obtained at to rule out DVT was negative.  Will continue to monitor closely.

## 2014-02-26 ENCOUNTER — Emergency Department (HOSPITAL_COMMUNITY)
Admission: EM | Admit: 2014-02-26 | Discharge: 2014-02-26 | Disposition: A | Discharge status: Home / Self Care | Payer: Medicaid Other | Attending: Emergency Medicine | Admitting: Emergency Medicine

## 2014-02-26 ENCOUNTER — Emergency Department (HOSPITAL_COMMUNITY): Payer: Medicaid Other

## 2014-02-26 ENCOUNTER — Encounter (HOSPITAL_COMMUNITY): Payer: Self-pay | Admitting: Emergency Medicine

## 2014-02-26 DIAGNOSIS — F329 Major depressive disorder, single episode, unspecified: Secondary | ICD-10-CM | POA: Insufficient documentation

## 2014-02-26 DIAGNOSIS — Z720 Tobacco use: Secondary | ICD-10-CM | POA: Insufficient documentation

## 2014-02-26 DIAGNOSIS — Z79899 Other long term (current) drug therapy: Secondary | ICD-10-CM | POA: Insufficient documentation

## 2014-02-26 DIAGNOSIS — Z8742 Personal history of other diseases of the female genital tract: Secondary | ICD-10-CM | POA: Diagnosis not present

## 2014-02-26 DIAGNOSIS — Z8619 Personal history of other infectious and parasitic diseases: Secondary | ICD-10-CM | POA: Insufficient documentation

## 2014-02-26 DIAGNOSIS — F419 Anxiety disorder, unspecified: Secondary | ICD-10-CM | POA: Insufficient documentation

## 2014-02-26 DIAGNOSIS — J9801 Acute bronchospasm: Secondary | ICD-10-CM | POA: Insufficient documentation

## 2014-02-26 DIAGNOSIS — R05 Cough: Secondary | ICD-10-CM | POA: Insufficient documentation

## 2014-02-26 DIAGNOSIS — J029 Acute pharyngitis, unspecified: Secondary | ICD-10-CM | POA: Insufficient documentation

## 2014-02-26 DIAGNOSIS — R21 Rash and other nonspecific skin eruption: Secondary | ICD-10-CM | POA: Insufficient documentation

## 2014-02-26 DIAGNOSIS — K219 Gastro-esophageal reflux disease without esophagitis: Secondary | ICD-10-CM | POA: Insufficient documentation

## 2014-02-26 LAB — CBC WITH DIFFERENTIAL/PLATELET
Basophils Absolute: 0 10*3/uL (ref 0.0–0.1)
Basophils Relative: 0 % (ref 0–1)
EOS ABS: 0 10*3/uL (ref 0.0–0.7)
Eosinophils Relative: 0 % (ref 0–5)
HEMATOCRIT: 39.7 % (ref 36.0–46.0)
Hemoglobin: 13.7 g/dL (ref 12.0–15.0)
Lymphocytes Relative: 26 % (ref 12–46)
Lymphs Abs: 3.3 10*3/uL (ref 0.7–4.0)
MCH: 30.5 pg (ref 26.0–34.0)
MCHC: 34.5 g/dL (ref 30.0–36.0)
MCV: 88.4 fL (ref 78.0–100.0)
MONO ABS: 0.5 10*3/uL (ref 0.1–1.0)
Monocytes Relative: 4 % (ref 3–12)
NEUTROS ABS: 8.8 10*3/uL — AB (ref 1.7–7.7)
Neutrophils Relative %: 70 % (ref 43–77)
Platelets: 124 10*3/uL — ABNORMAL LOW (ref 150–400)
RBC: 4.49 MIL/uL (ref 3.87–5.11)
RDW: 13.9 % (ref 11.5–15.5)
WBC: 12.6 10*3/uL — ABNORMAL HIGH (ref 4.0–10.5)

## 2014-02-26 LAB — COMPREHENSIVE METABOLIC PANEL
ALBUMIN: 3.7 g/dL (ref 3.5–5.2)
ALT: 38 U/L — ABNORMAL HIGH (ref 0–35)
ANION GAP: 14 (ref 5–15)
AST: 25 U/L (ref 0–37)
Alkaline Phosphatase: 151 U/L — ABNORMAL HIGH (ref 39–117)
BUN: 7 mg/dL (ref 6–23)
CALCIUM: 8.6 mg/dL (ref 8.4–10.5)
CO2: 20 mEq/L (ref 19–32)
Chloride: 96 mEq/L (ref 96–112)
Creatinine, Ser: 0.59 mg/dL (ref 0.50–1.10)
GFR calc non Af Amer: >90 mL/min (ref >90)
Glucose, Bld: 100 mg/dL — ABNORMAL HIGH (ref 70–99)
Potassium: 4 mEq/L (ref 3.7–5.3)
Sodium: 130 mEq/L — ABNORMAL LOW (ref 137–147)
TOTAL PROTEIN: 6.5 g/dL (ref 6.0–8.3)
Total Bilirubin: 0.7 mg/dL (ref 0.3–1.2)

## 2014-02-26 MED ORDER — ALBUTEROL SULFATE (2.5 MG/3ML) 0.083% IN NEBU
5.0000 mg | INHALATION_SOLUTION | Freq: Once | RESPIRATORY_TRACT | Status: AC
  Administered 2014-02-26: 5 mg via RESPIRATORY_TRACT
  Filled 2014-02-26: qty 6

## 2014-02-26 MED ORDER — FLUCONAZOLE 200 MG PO TABS
200.0000 mg | ORAL_TABLET | Freq: Once | ORAL | Status: AC
  Administered 2014-02-26: 200 mg via ORAL
  Filled 2014-02-26: qty 1

## 2014-02-26 MED ORDER — ALBUTEROL SULFATE HFA 108 (90 BASE) MCG/ACT IN AERS
2.0000 | INHALATION_SPRAY | Freq: Once | RESPIRATORY_TRACT | Status: AC
  Administered 2014-02-26: 2 via RESPIRATORY_TRACT
  Filled 2014-02-26: qty 6.7

## 2014-02-26 MED ORDER — NYSTATIN 100000 UNIT/GM EX POWD: CUTANEOUS | Status: DC

## 2014-02-26 MED ORDER — FLUCONAZOLE 100 MG PO TABS: 100.0000 mg | ORAL_TABLET | Freq: Every day | ORAL | Status: DC

## 2014-02-26 NOTE — ED Provider Notes (Signed)
CSN: [id number]     Arrival date & time 02/26/14  1051 History   First MD Initiated Contact with Patient 02/26/14 1137     Chief Complaint  Patient presents with  . Rash     (Consider location/radiation/quality/duration/timing/severity/associated sxs/prior Treatment) HPI Comments: This is a 58 year old female with a past medical history of breast cancer currently under chemotherapy, PUD, fibromyalgia, rheumatoid arthritis, SVT, depression, osteoporosis hepatitis C and anxiety who presents to the emergency department complaining of a painful rash in her right groin area x1 week. Patient showed the rash to her oncologist 4 days ago who prescribed her hydrocortisone cream and told her to keep the area dry. States the rash is worsening and she noticed a blister to the area. It is not itchy. She is also complaining of productive cough with yellow mucus, generalized weakness, shortness of breath and a sore throat over the past few days. Denies any fevers. Last chemotherapy treatment was October 2, 8 days ago.  Patient is a 58 y.o. female presenting with rash. The history is provided by the patient.  Rash Associated symptoms: fatigue, shortness of breath and sore throat     Past Medical History  Diagnosis Date  . 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), hypoglycemia 2/04-resolved with PO calcium + D, SVT, was treated with iterferon and ribavirin-10/3-2/04  . GERD (gastroesophageal reflux disease)   . PUD (peptic ulcer disease)   . Fibromyalgia   . Rheumatoid arthritis(714.0)     possible  . SVT (supraventricular tachycardia)   . Depression   . Osteoporosis   . Hepatitis C     Pt was given too much interferon - critical care x2d, 5 days total hosp  . Anxiety   . Wears glasses   . Heavy smoker    Past Surgical History  Procedure Laterality Date  . Cesarean section  09/17/80  . Dilation and curettage of uterus     . Hysterectomy-for dysmenorrhea  09/17/00  . Nasal sinus surgery  03/20/84    for deviated septum  . Tonsillectomy  05/20/61  . Abdominal hysterectomy    . Colonoscopy    . Upper gi endoscopy    . Portacath placement Left 01/20/2014    Procedure: INSERTION PORT-A-CATH;  Surgeon: [name], MD;  Location: [facility];  Service: General;  Laterality: Left;   Family History  Problem Relation Age of Onset  . Colon cancer Neg Hx   . Cancer Father 57    lung  . Dementia Father   . Dementia Mother   . Hypertension Mother   . Hyperlipidemia Mother   . Breast cancer Maternal Aunt   . Cancer Paternal Uncle   . Breast cancer Maternal Aunt   . Breast cancer Maternal Aunt    History  Substance Use Topics  . Smoking status: Current Every Day Smoker -- 2.00 packs/day for 45 years    Types: Cigarettes  . Smokeless tobacco: Never Used     Comment: 2 ppd since 58 yo   . Alcohol Use: Yes     Comment: denies; reports 3-4 beers/night to Dr. 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  HENT: Positive for sore throat.   Respiratory: Positive for cough and shortness of breath.   Skin: Positive for rash.  All other systems reviewed and are negative.     Allergies  Codeine  Home Medications   Prior to Admission medications   Medication Sig Start Date End Date Taking? Authorizing Provider  ALPRAZolam Duanne Moron) 0.5 MG tablet Take 0.5 mg by mouth 3 (three) times daily as needed for anxiety (anxiety).   Yes Historical Provider, MD  atenolol (TENORMIN) 50 MG tablet Take 50 mg by mouth 2 (two) times daily.   Yes Historical Provider, MD  dexamethasone (DECADRON) 4 MG tablet Take 2 tablets (8 mg total) by mouth 2 (two) times daily. Start the day before Taxotere. Then again the day after chemo for 3 days. 01/26/14   Yes Rulon Eisenmenger, MD  lidocaine-prilocaine (EMLA) cream Apply 1 application topically as needed (port a cath site). Apply to [facility] a cath site 2 hours prior to chemotherapy appointment 01/27/14  Yes Rulon Eisenmenger, MD  LORazepam (ATIVAN) 0.5 MG tablet Take 1.5 mg by mouth at bedtime.   Yes Historical Provider, MD  naproxen sodium (ANAPROX) 220 MG tablet Take 440-660 mg by mouth 2 (two) times daily.    Yes Historical Provider, MD  omeprazole (PRILOSEC) 20 MG capsule Take 20 mg by mouth every evening.    Yes Historical Provider, MD  ondansetron (ZOFRAN) 8 MG tablet Take 8 mg by mouth every 8 (eight) hours as needed for nausea or vomiting (nausea).   Yes Historical Provider, MD  oxyCODONE-acetaminophen (ROXICET) 5-325 MG per tablet Take 1 tablet by mouth every 4 (four) hours as needed for severe pain (pain).   Yes Historical Provider, MD  prochlorperazine (COMPAZINE) 10 MG tablet Take 1 tablet (10 mg total) by mouth every 6 (six) hours as needed (Nausea or vomiting). 01/26/14  Yes Rulon Eisenmenger, MD  sertraline (ZOLOFT) 50 MG tablet Take 50 mg by mouth at bedtime. 04/03/13  Yes Kambra Beachem Haber, MD  fluconazole (DIFLUCAN) 100 MG tablet Take 1 tablet (100 mg total) by mouth daily. 02/26/14   Janis Cuffe M Mima Cranmore, PA-C  nystatin (MYCOSTATIN/NYSTOP) 100000 UNIT/GM POWD Apply to the affected area daily until resolved. 02/26/14   Zed Wanninger M Lovie Agresta, PA-C   BP 120/83  Pulse 97  Temp(Src) 97.7 F (36.5 C) (Oral)  Resp 14  SpO2 97% Physical Exam  Nursing note and vitals reviewed. Constitutional: She is oriented to person, place, and time. She appears well-developed and well-nourished. No distress.  HENT:  Head: Normocephalic and atraumatic.  Post oropharyngeal edema and edema without exudate.  Eyes: Conjunctivae and EOM are normal. Pupils are equal, round, and reactive to light.  Neck: Normal range of motion. Neck supple.  Cardiovascular: Normal rate, regular rhythm and normal heart sounds.   Pulmonary/Chest: Effort  normal. No respiratory distress.  Scattered inspiratory < expiratory wheezes bilateral.  Musculoskeletal: Normal range of motion. She exhibits no edema.  Neurological: She is alert and oriented to person, place, and time. No sensory deficit.  Skin: Skin is warm.  Moist, erythematous rash with some skin cracking and few pustules with white drainage in right groin area and under right side of pannus, fungal in appearance.  Psychiatric: She has a normal mood and affect. Her behavior is normal.    ED Course  Procedures (including critical care time) Labs Review Labs Reviewed  CBC WITH DIFFERENTIAL - Abnormal; Notable for the following:    WBC 12.6 (*)    Platelets 124 (*)  Neutro Abs 8.8 (*)    All other components within normal limits  COMPREHENSIVE METABOLIC PANEL - Abnormal; Notable for the following:    Sodium 130 (*)    Glucose, Bld 100 (*)    ALT 38 (*)    Alkaline Phosphatase 151 (*)    All other components within normal limits  WOUND CULTURE    Imaging Review Dg Chest 2 View  02/26/2014   CLINICAL DATA:  Cough, rash and history of breast carcinoma.  EXAM: CHEST - 2 VIEW  COMPARISON:  01/20/2014  FINDINGS: The heart size and mediastinal contours are within normal limits. Stable appearance of left-sided Port-A-Cath. Probable underlying mild chronic lung disease with bibasilar scarring. There is no evidence of pulmonary edema, consolidation, pneumothorax, nodule or pleural fluid. The visualized skeletal structures are unremarkable.  IMPRESSION: No active disease.   Electronically Signed   By: Aletta Edouard M.D.   On: 02/26/2014 13:28     EKG Interpretation None      MDM   Final diagnoses:  Rash  Bronchospasm   Pt non-toxic appearing and in NAD. AFVSS. Rash in groin area has a fungal appearance. No vesicles concerning for shingles. Area with white drainage cultured. Labs with leukocytosis of 12.6, however she did receive Neulasta on 10/3. Plan to treat with oral  fluconazole along with topical nystatin powder. Advised her to stop using hydrocortisone cream. Regarding shortness of breath and cough, wheezing completely subsided with neb treatment and pt reports she is feeling significantly better. CXR clear. O2 sat 97% on RA. No fevers. Pt is stable for d/c home. F/u with both PCP and oncology. Return precautions given. Patient states understanding of treatment care plan and is agreeable.  Case discussed with attending Dr. Tomi Bamberger who agrees with plan of care.   Carman Ching, PA-C 02/26/14 [phone]

## 2014-02-26 NOTE — ED Notes (Signed)
She tells me she has had weakness/cough and general discomfort since beginning chemo. For breast cancer on Sep't. 11 of this year.  She also mentions "burning, painful" vesicular rash at right lower abd/groin area.  She is in no distress and is mildly short of breath.  Plan to take to positive pressure room asap.

## 2014-02-26 NOTE — Discharge Instructions (Signed)
Take fluconazole daily as prescribed beginning tomorrow as you were given the first dose in the emergency department today. Apply topical nystatin powder daily until rash has resolved. Use albuterol inhaler every 4-6 hours as needed for cough and wheezing. Rest and stay well-hydrated. Followup with both your oncologist and PCP.  Asthma, Acute Bronchospasm Acute bronchospasm caused by asthma is also referred to as an asthma attack. Bronchospasm means your air passages become narrowed. The narrowing is caused by inflammation and tightening of the muscles in the air tubes (bronchi) in your lungs. This can make it hard to breathe or cause you to wheeze and cough. CAUSES Possible triggers are:  Animal dander from the skin, hair, or feathers of animals.  Dust mites contained in house dust.  Cockroaches.  Pollen from trees or grass.  Mold.  Cigarette or tobacco smoke.  Air pollutants such as dust, household cleaners, hair sprays, aerosol sprays, paint fumes, strong chemicals, or strong odors.  Cold air or weather changes. Cold air may trigger inflammation. Winds increase molds and pollens in the air.  Strong emotions such as crying or laughing hard.  Stress.  Certain medicines such as aspirin or beta-blockers.  Sulfites in foods and drinks, such as dried fruits and wine.  Infections or inflammatory conditions, such as a flu, cold, or inflammation of the nasal membranes (rhinitis).  Gastroesophageal reflux disease (GERD). GERD is a condition where stomach acid backs up into your esophagus.  Exercise or strenuous activity. SIGNS AND SYMPTOMS   Wheezing.  Excessive coughing, particularly at night.  Chest tightness.  Shortness of breath. DIAGNOSIS  Your health care provider will ask you about your medical history and perform a physical exam. A chest X-ray or blood testing may be performed to look for other causes of your symptoms or other conditions that may have triggered your  asthma attack. TREATMENT  Treatment is aimed at reducing inflammation and opening up the airways in your lungs. Most asthma attacks are treated with inhaled medicines. These include quick relief or rescue medicines (such as bronchodilators) and controller medicines (such as inhaled corticosteroids). These medicines are sometimes given through an inhaler or a nebulizer. Systemic steroid medicine taken by mouth or given through an IV tube also can be used to reduce the inflammation when an attack is moderate or severe. Antibiotic medicines are only used if a bacterial infection is present.  HOME CARE INSTRUCTIONS   Rest.  Drink plenty of liquids. This helps the mucus to remain thin and be easily coughed up. Only use caffeine in moderation and do not use alcohol until you have recovered from your illness.  Do not smoke. Avoid being exposed to secondhand smoke.  You play a critical role in keeping yourself in good health. Avoid exposure to things that cause you to wheeze or to have breathing problems.  Keep your medicines up-to-date and available. Carefully follow your health care provider's treatment plan.  Take your medicine exactly as prescribed.  When pollen or pollution is bad, keep windows closed and use an air conditioner or go to places with air conditioning.  Asthma requires careful medical care. See your health care provider for a follow-up as advised. If you are more than [redacted] weeks pregnant and you were prescribed any new medicines, let your obstetrician know about the visit and how you are doing. Follow up with your health care provider as directed.  After you have recovered from your asthma attack, make an appointment with your outpatient doctor to talk about  ways to reduce the likelihood of future attacks. If you do not have a doctor who manages your asthma, make an appointment with a primary care doctor to discuss your asthma. SEEK IMMEDIATE MEDICAL CARE IF:   You are getting  worse.  You have trouble breathing. If severe, call your local emergency services (911 in the [place]  You develop chest pain or discomfort.  You are vomiting.  You are not able to keep fluids down.  You are coughing up yellow, green, brown, or bloody sputum.  You have a fever and your symptoms suddenly get worse.  You have trouble swallowing. MAKE SURE YOU:   Understand these instructions.  Will watch your condition.  Will get help right away if you are not doing well or get worse. Document Released: 08/21/2006 Document Revised: 05/11/2013 Document Reviewed: 11/11/2012 [facility] Patient Information 2015 [place], [place]. This information is not intended to replace advice given to you by your health care provider. Make sure you discuss any questions you have with your health care provider.  Cutaneous Candidiasis Cutaneous candidiasis is a condition in which there is an overgrowth of yeast (candida) on the skin. Yeast normally live on the skin, but in small enough numbers not to cause any symptoms. In certain cases, increased growth of the yeast may cause an actual yeast infection. This kind of infection usually occurs in areas of the skin that are constantly warm and moist, such as the armpits or the groin. Yeast is the most common cause of diaper rash in babies and in people who cannot control their bowel movements (incontinence). CAUSES  The fungus that most often causes cutaneous candidiasis is Candida albicans. Conditions that can increase the risk of getting a yeast infection of the skin include:  Obesity.  Pregnancy.  Diabetes.  Taking antibiotic medicine.  Taking birth control pills.  Taking steroid medicines.  Thyroid disease.  An iron or zinc deficiency.  Problems with the immune system. SYMPTOMS   Red, swollen area of the skin.  Bumps on the skin.  Itchiness. DIAGNOSIS  The diagnosis of cutaneous candidiasis is usually based on its appearance. Light  scrapings of the skin may also be taken and viewed under a microscope to identify the presence of yeast. TREATMENT  Antifungal creams may be applied to the infected skin. In severe cases, oral medicines may be needed.  HOME CARE INSTRUCTIONS   Keep your skin clean and dry.  Maintain a healthy weight.  If you have diabetes, keep your blood sugar under control. SEEK IMMEDIATE MEDICAL CARE IF:  Your rash continues to spread despite treatment.  You have a fever, chills, or abdominal pain. Document Released: 01/22/2011 Document Revised: 07/29/2011 Document Reviewed: 01/22/2011 [facility] Patient Information 2015 [place], [place]. This information is not intended to replace advice given to you by your health care provider. Make sure you discuss any questions you have with your health care provider.  Rash A rash is a change in the color or texture of your skin. There are many different types of rashes. You may have other problems that accompany your rash. CAUSES   Infections.  Allergic reactions. This can include allergies to pets or foods.  Certain medicines.  Exposure to certain chemicals, soaps, or cosmetics.  Heat.  Exposure to poisonous plants.  Tumors, both cancerous and noncancerous. SYMPTOMS   Redness.  Scaly skin.  Itchy skin.  Dry or cracked skin.  Bumps.  Blisters.  Pain. DIAGNOSIS  Your caregiver may do a physical exam to determine  what type of rash you have. A skin sample (biopsy) may be taken and examined under a microscope. TREATMENT  Treatment depends on the type of rash you have. Your caregiver may prescribe certain medicines. For serious conditions, you may need to see a skin doctor (dermatologist). HOME CARE INSTRUCTIONS   Avoid the substance that caused your rash.  Do not scratch your rash. This can cause infection.  You may take cool baths to help stop itching.  Only take over-the-counter or prescription medicines as directed by your  caregiver.  Keep all follow-up appointments as directed by your caregiver. SEEK IMMEDIATE MEDICAL CARE IF:  You have increasing pain, swelling, or redness.  You have a fever.  You have new or severe symptoms.  You have body aches, diarrhea, or vomiting.  Your rash is not better after 3 days. MAKE SURE YOU:  Understand these instructions.  Will watch your condition.  Will get help right away if you are not doing well or get worse. Document Released: 04/26/2002 Document Revised: 07/29/2011 Document Reviewed: 02/18/2011 [facility] Patient Information 2015 [place], [place]. This information is not intended to replace advice given to you by your health care provider. Make sure you discuss any questions you have with your health care provider.

## 2014-02-26 NOTE — ED Notes (Signed)
Attempted to give neb, meds not in pyxis. Called pharmacy to send meds

## 2014-02-26 NOTE — ED Notes (Signed)
Pt notified that after sends her abx, we will d/c her home

## 2014-02-26 NOTE — ED Provider Notes (Signed)
Medical screening examination/treatment/procedure(s) were performed by non-physician practitioner and as supervising physician I was immediately available for consultation/collaboration.   EKG Interpretation None      Rolland Porter, MD, Abr[facility] Sander   Janice Norrie, MD 02/26/14 [phone]

## 2014-02-28 ENCOUNTER — Other Ambulatory Visit: Payer: Self-pay | Admitting: Hematology and Oncology

## 2014-02-28 ENCOUNTER — Telehealth: Payer: Self-pay

## 2014-02-28 ENCOUNTER — Encounter: Payer: Self-pay | Admitting: Hematology and Oncology

## 2014-02-28 ENCOUNTER — Telehealth: Payer: Self-pay | Admitting: Nurse Practitioner

## 2014-02-28 DIAGNOSIS — C50411 Malignant neoplasm of upper-outer quadrant of right female breast: Secondary | ICD-10-CM

## 2014-02-28 LAB — WOUND CULTURE

## 2014-02-28 MED ORDER — ALPRAZOLAM 0.5 MG PO TABS: 0.5000 mg | ORAL_TABLET | Freq: Three times a day (TID) | ORAL | Status: DC | PRN

## 2014-02-28 MED ORDER — FLUCONAZOLE 100 MG PO TABS: 100.0000 mg | ORAL_TABLET | Freq: Every day | ORAL | Status: DC

## 2014-02-28 MED ORDER — SERTRALINE HCL 50 MG PO TABS: 50.0000 mg | ORAL_TABLET | Freq: Every day | ORAL | Status: DC

## 2014-02-28 MED ORDER — NYSTATIN 100000 UNIT/GM EX POWD: CUTANEOUS | Status: DC

## 2014-02-28 MED ORDER — NYSTATIN 100000 UNIT/GM EX POWD
CUTANEOUS | Status: DC
Start: 2014-02-28 — End: 2014-02-28

## 2014-02-28 MED ORDER — PROCHLORPERAZINE MALEATE 10 MG PO TABS
10.0000 mg | ORAL_TABLET | Freq: Four times a day (QID) | ORAL | Status: DC | PRN
Start: 2014-02-28 — End: 2014-04-05

## 2014-02-28 MED ORDER — ONDANSETRON HCL 8 MG PO TABS: 8.0000 mg | ORAL_TABLET | Freq: Three times a day (TID) | ORAL | Status: DC | PRN

## 2014-02-28 NOTE — Progress Notes (Signed)
Faxed applications for perjeta, herceptin and neulasta assistance.

## 2014-02-28 NOTE — Telephone Encounter (Signed)
Pt notified- Pt request [facility] instead of [facility].

## 2014-02-28 NOTE — Telephone Encounter (Signed)
Pt received prescription for xanax from another prescriber today.  Per Dr Lindi Adie - Ativan refill not necessary.  Diflucan and nystatin printed in error.  Scrip re-entered.

## 2014-02-28 NOTE — Telephone Encounter (Signed)
Meds ordered this encounter  Medications  . ALPRAZolam (XANAX) 0.5 MG tablet    Sig: Take 1 tablet (0.5 mg total) by mouth 3 (three) times daily as needed for anxiety (anxiety).    Dispense:  90 tablet    Refill:  0  . sertraline (ZOLOFT) 50 MG tablet    Sig: Take 1 tablet (50 mg total) by mouth at bedtime.    Dispense:  90 tablet    Refill:  3

## 2014-02-28 NOTE — Telephone Encounter (Signed)
Pt states she is during Chemo and isn't able to come in for her refills, but would like to have her Zoloft 50mg s, Xanax 0.5mg s. Please call [phone] when ready for pick up

## 2014-02-28 NOTE — Telephone Encounter (Signed)
Per 10/12 POF labs/ov pt confirmed apt time .Marland Kitchen... KJ

## 2014-03-03 ENCOUNTER — Ambulatory Visit (HOSPITAL_BASED_OUTPATIENT_CLINIC_OR_DEPARTMENT_OTHER): Payer: Self-pay | Admitting: Nurse Practitioner

## 2014-03-03 ENCOUNTER — Encounter: Payer: Self-pay | Admitting: Hematology and Oncology

## 2014-03-03 ENCOUNTER — Other Ambulatory Visit (HOSPITAL_BASED_OUTPATIENT_CLINIC_OR_DEPARTMENT_OTHER): Payer: Self-pay

## 2014-03-03 VITALS — BP 116/71 | HR 76 | Temp 97.5°F | Resp 19 | Ht 64.0 in | Wt 159.4 lb

## 2014-03-03 DIAGNOSIS — R630 Anorexia: Secondary | ICD-10-CM

## 2014-03-03 DIAGNOSIS — C50811 Malignant neoplasm of overlapping sites of right female breast: Secondary | ICD-10-CM

## 2014-03-03 DIAGNOSIS — G4700 Insomnia, unspecified: Secondary | ICD-10-CM

## 2014-03-03 DIAGNOSIS — R740 Nonspecific elevation of levels of transaminase and lactic acid dehydrogenase [LDH]: Secondary | ICD-10-CM

## 2014-03-03 DIAGNOSIS — R432 Parageusia: Secondary | ICD-10-CM

## 2014-03-03 DIAGNOSIS — L271 Localized skin eruption due to drugs and medicaments taken internally: Secondary | ICD-10-CM

## 2014-03-03 DIAGNOSIS — C50411 Malignant neoplasm of upper-outer quadrant of right female breast: Secondary | ICD-10-CM

## 2014-03-03 DIAGNOSIS — R7401 Elevation of levels of liver transaminase levels: Secondary | ICD-10-CM

## 2014-03-03 DIAGNOSIS — R21 Rash and other nonspecific skin eruption: Secondary | ICD-10-CM

## 2014-03-03 DIAGNOSIS — R634 Abnormal weight loss: Secondary | ICD-10-CM

## 2014-03-03 DIAGNOSIS — B370 Candidal stomatitis: Secondary | ICD-10-CM

## 2014-03-03 DIAGNOSIS — B3781 Candidal esophagitis: Secondary | ICD-10-CM

## 2014-03-03 DIAGNOSIS — J40 Bronchitis, not specified as acute or chronic: Secondary | ICD-10-CM

## 2014-03-03 LAB — COMPREHENSIVE METABOLIC PANEL (CC13)
ALBUMIN: 3.5 g/dL (ref 3.5–5.0)
ALT: 67 U/L — ABNORMAL HIGH (ref 0–55)
AST: 34 U/L (ref 5–34)
Alkaline Phosphatase: 138 U/L (ref 40–150)
Anion Gap: 9 mEq/L (ref 3–11)
BUN: 4.5 mg/dL — AB (ref 7.0–26.0)
CO2: 24 mEq/L (ref 22–29)
Calcium: 9 mg/dL (ref 8.4–10.4)
Chloride: 104 mEq/L (ref 98–109)
Creatinine: 0.8 mg/dL (ref 0.6–1.1)
GLUCOSE: 97 mg/dL (ref 70–140)
POTASSIUM: 3.8 meq/L (ref 3.5–5.1)
SODIUM: 137 meq/L (ref 136–145)
TOTAL PROTEIN: 6.5 g/dL (ref 6.4–8.3)
Total Bilirubin: 0.64 mg/dL (ref 0.20–1.20)

## 2014-03-03 LAB — CBC WITH DIFFERENTIAL/PLATELET
BASO%: 0.5 % (ref 0.0–2.0)
Basophils Absolute: 0 10*3/uL (ref 0.0–0.1)
EOS%: 0.2 % (ref 0.0–7.0)
Eosinophils Absolute: 0 10*3/uL (ref 0.0–0.5)
HCT: 39.3 % (ref 34.8–46.6)
HGB: 13 g/dL (ref 11.6–15.9)
LYMPH%: 32.3 % (ref 14.0–49.7)
MCH: 30 pg (ref 25.1–34.0)
MCHC: 33.2 g/dL (ref 31.5–36.0)
MCV: 90.3 fL (ref 79.5–101.0)
MONO#: 0.3 10*3/uL (ref 0.1–0.9)
MONO%: 4 % (ref 0.0–14.0)
NEUT#: 5 10*3/uL (ref 1.5–6.5)
NEUT%: 63 % (ref 38.4–76.8)
PLATELETS: 162 10*3/uL (ref 145–400)
RBC: 4.35 10*6/uL (ref 3.70–5.45)
RDW: 14.1 % (ref 11.2–14.5)
WBC: 7.9 10*3/uL (ref 3.9–10.3)
lymph#: 2.6 10*3/uL (ref 0.9–3.3)

## 2014-03-03 MED ORDER — LORAZEPAM 0.5 MG PO TABS: 1.0000 mg | ORAL_TABLET | Freq: Every day | ORAL | Status: DC

## 2014-03-03 MED ORDER — MAGIC MOUTHWASH W/LIDOCAINE: 5.0000 mL | Freq: Four times a day (QID) | ORAL | Status: DC | PRN

## 2014-03-03 MED ORDER — LEVOFLOXACIN 500 MG PO TABS: 500.0000 mg | ORAL_TABLET | Freq: Every day | ORAL | Status: DC

## 2014-03-03 MED ORDER — FLUCONAZOLE 100 MG PO TABS: 100.0000 mg | ORAL_TABLET | Freq: Every day | ORAL | Status: DC

## 2014-03-03 NOTE — Progress Notes (Signed)
[employer] denied perjeta and herceptin stating the patient does not meet eligibility requirements, she still receives the [facility] discount.

## 2014-03-04 ENCOUNTER — Encounter: Payer: Self-pay | Admitting: Nurse Practitioner

## 2014-03-04 DIAGNOSIS — J40 Bronchitis, not specified as acute or chronic: Secondary | ICD-10-CM | POA: Insufficient documentation

## 2014-03-04 DIAGNOSIS — R21 Rash and other nonspecific skin eruption: Secondary | ICD-10-CM | POA: Insufficient documentation

## 2014-03-04 DIAGNOSIS — R432 Parageusia: Secondary | ICD-10-CM | POA: Insufficient documentation

## 2014-03-04 DIAGNOSIS — R634 Abnormal weight loss: Secondary | ICD-10-CM | POA: Insufficient documentation

## 2014-03-04 DIAGNOSIS — R630 Anorexia: Secondary | ICD-10-CM | POA: Insufficient documentation

## 2014-03-04 DIAGNOSIS — R740 Nonspecific elevation of levels of transaminase and lactic acid dehydrogenase [LDH]: Secondary | ICD-10-CM

## 2014-03-04 DIAGNOSIS — R7401 Elevation of levels of liver transaminase levels: Secondary | ICD-10-CM | POA: Insufficient documentation

## 2014-03-04 DIAGNOSIS — G4700 Insomnia, unspecified: Secondary | ICD-10-CM | POA: Insufficient documentation

## 2014-03-04 NOTE — Progress Notes (Signed)
SYMPTOM MANAGEMENT CLINIC   HPI: [name] 58 y.o. female diagnosed with right breast cancer.  Currently undergoing Taxotere/carboplatinum/Herceptin/Perjeta chemotherapy regimen.  Patient called the cancer Center today requesting urgent care visit.  She continues to complain of some chronic cough and occasional wheezing.  She also continues with a rash to her right groin and bilateral hands.  She states that this rash is essentially stable at present; but continues to be pruritic.  Patient presented to the emergency department on 02/26/2014 with the same complaints.  Chest x-ray obtained while in the emergency department showed no pneumonia.  Patient was treated for a probable fungal rash to her right groin and bilateral hands.  She was prescribed Diflucan 100 mg on a daily basis; as well as nystatin powder to   use on the rash.  Patient states that her rash appears stable; but she continues with her cough/wheezing.  She is complaining of a dry, non-productive cough.  She denies any recent fevers or chills.  She continues to complain of some mild esophagitis symptoms as well.  Patient also is complaining of her appetite; stating that she has decreased taste sensation and continues to lose some weight.  She also reports that that taking the lorazepam at night has helped her with her insomnia issues.  Also, patient was noted have significant hand/foot syndrome with cracking of multiple fingertips the at exam last week; but on her fingertips  do appear to be slowly healing.   HPI  CURRENT THERAPY: Upcoming Treatment Dates - BREAST TCH q21d (order Herceptin separately) Days with orders from any treatment category:  03/11/2014      SCHEDULING COMMUNICATION      ondansetron (ZOFRAN) IVPB 16 mg      Dexamethasone Sodium Phosphate (DECADRON) injection 20 mg      DOCEtaxel (TAXOTERE) 140 mg in dextrose 5 % 250 mL chemo infusion      CARBOplatin (PARAPLATIN) 700 mg in sodium chloride 0.9 %  250 mL chemo infusion      sodium chloride 0.9 % injection 10 mL      heparin lock flush 100 unit/mL      heparin lock flush 100 unit/mL      alteplase (CATHFLO ACTIVASE) injection 2 mg      sodium chloride 0.9 % injection 3 mL      Cold Pack 1 packet      diphenhydrAMINE (BENADRYL) injection 25 mg      famotidine (PEPCID) IVPB 20 mg      0.9 %  sodium chloride infusion      methylPREDNISolone sodium succinate (SOLU-MEDROL) 125 mg/2 mL injection 125 mg      EPINEPHrine (ADRENALIN) 0.1 MG/ML injection 0.25 mg      EPINEPHrine (ADRENALIN) 0.1 MG/ML injection 0.25 mg      EPINEPHrine (ADRENALIN) injection 0.5 mg      EPINEPHrine (ADRENALIN) injection 0.5 mg      diphenhydrAMINE (BENADRYL) injection 50 mg      albuterol (PROVENTIL) (2.5 MG/3ML) 0.083% nebulizer solution 2.5 mg      0.9 %  sodium chloride infusion      TREATMENT CONDITIONS 03/12/2014      SCHEDULING COMMUNICATION INJECTION      pegfilgrastim (NEULASTA) injection 6 mg 04/01/2014      SCHEDULING COMMUNICATION      ondansetron (ZOFRAN) IVPB 16 mg      Dexamethasone Sodium Phosphate (DECADRON) injection 20 mg      DOCEtaxel (TAXOTERE) 140 mg in dextrose  5 % 250 mL chemo infusion      CARBOplatin (PARAPLATIN) 700 mg in sodium chloride 0.9 % 250 mL chemo infusion      sodium chloride 0.9 % injection 10 mL      heparin lock flush 100 unit/mL      heparin lock flush 100 unit/mL      alteplase (CATHFLO ACTIVASE) injection 2 mg      sodium chloride 0.9 % injection 3 mL      Cold Pack 1 packet      diphenhydrAMINE (BENADRYL) injection 25 mg      famotidine (PEPCID) IVPB 20 mg      0.9 %  sodium chloride infusion      methylPREDNISolone sodium succinate (SOLU-MEDROL) 125 mg/2 mL injection 125 mg      EPINEPHrine (ADRENALIN) 0.1 MG/ML injection 0.25 mg      EPINEPHrine (ADRENALIN) 0.1 MG/ML injection 0.25 mg      EPINEPHrine (ADRENALIN) injection 0.5 mg      EPINEPHrine (ADRENALIN) injection 0.5 mg      diphenhydrAMINE  (BENADRYL) injection 50 mg      albuterol (PROVENTIL) (2.5 MG/3ML) 0.083% nebulizer solution 2.5 mg      0.9 %  sodium chloride infusion      TREATMENT CONDITIONS    ROS  Past Medical History  Diagnosis Date  . 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), hypoglycemia 2/04-resolved with PO calcium + D, SVT, was treated with iterferon and ribavirin-10/3-2/04  . GERD (gastroesophageal reflux disease)   . PUD (peptic ulcer disease)   . Fibromyalgia   . Rheumatoid arthritis(714.0)     possible  . SVT (supraventricular tachycardia)   . Depression   . Osteoporosis   . Hepatitis C     Pt was given too much interferon - critical care x2d, 5 days total hosp  . Anxiety   . Wears glasses   . Heavy smoker     Past Surgical History  Procedure Laterality Date  . Cesarean section  09/17/80  . Dilation and curettage of uterus    . Hysterectomy-for dysmenorrhea  09/17/00  . Nasal sinus surgery  03/20/84    for deviated septum  . Tonsillectomy  05/20/61  . Abdominal hysterectomy    . Colonoscopy    . Upper gi endoscopy    . Portacath placement Left 01/20/2014    Procedure: INSERTION PORT-A-CATH;  Surgeon: [name], MD;  Location: [place];  Service: General;  Laterality: Left;    has HEPATITIS B; HEPATITIS C; TOBACCO ABUSE; PSVT; GASTROESOPHAGEAL REFLUX, NO ESOPHAGITIS; FIBROMYALGIA; Rheumatoid arthritis; Breast cancer of upper-outer quadrant of right female breast; Thrush of mouth and esophagus; Diarrhea due to drug; Extremity edema; Dehydration; Hand foot syndrome; Rash; Bronchitis; Anorexia; Altered taste; Weight loss; Insomnia; and Transaminitis on her problem list.     is allergic to codeine.    Medication List       This list is accurate as of: 03/03/14 11:59 PM.  Always use your most recent med list.               ALPRAZolam 0.5 MG tablet  Commonly known as:  XANAX  Take 1 tablet  (0.5 mg total) by mouth 3 (three) times daily as needed for anxiety (anxiety).     atenolol 50 MG tablet  Commonly known as:  TENORMIN  Take 50 mg by mouth 2 (two) times daily.  dexamethasone 4 MG tablet  Commonly known as:  DECADRON  TAKE 2 TABLETS BY MOUTH 2 TIMES A DAY. START THE DAY BEFORE TAXOTERE, THEN AGAIN THE DAY AFTER CHEMO FOR 3 DAYS     fluconazole 100 MG tablet  Commonly known as:  DIFLUCAN  Take 1 tablet (100 mg total) by mouth daily.     levofloxacin 500 MG tablet  Commonly known as:  LEVAQUIN  Take 1 tablet (500 mg total) by mouth daily.     lidocaine-prilocaine cream  Commonly known as:  EMLA  Apply 1 application topically as needed (port a cath site). Apply to [facility] a cath site 2 hours prior to chemotherapy appointment     LORazepam 0.5 MG tablet  Commonly known as:  ATIVAN  Take 2 tablets (1 mg total) by mouth at bedtime.     magic mouthwash w/lidocaine Soln  Take 5 mLs by mouth 4 (four) times daily as needed for mouth pain (duke's formula with nystatin and lidocaine; all in 1:1 combo please.).     naproxen sodium 220 MG tablet  Commonly known as:  ANAPROX  Take 440-660 mg by mouth 2 (two) times daily.     nystatin 100000 UNIT/GM Powd  Apply to the affected area daily until resolved.     omeprazole 20 MG capsule  Commonly known as:  PRILOSEC  Take 20 mg by mouth every evening.     ondansetron 8 MG tablet  Commonly known as:  ZOFRAN  Take 1 tablet (8 mg total) by mouth every 8 (eight) hours as needed for nausea or vomiting (nausea).     prochlorperazine 10 MG tablet  Commonly known as:  COMPAZINE  Take 1 tablet (10 mg total) by mouth every 6 (six) hours as needed (Nausea or vomiting).     ROXICET 5-325 MG per tablet  Generic drug:  oxyCODONE-acetaminophen  Take 1 tablet by mouth every 4 (four) hours as needed for severe pain (pain).     sertraline 50 MG tablet  Commonly known as:  ZOLOFT  Take 1 tablet (50 mg total) by mouth at bedtime.           PHYSICAL EXAMINATION  Blood pressure 116/71, pulse 76, temperature 97.5 F (36.4 C), temperature source Oral, resp. rate 19, height 5\' 4"  (1.626 m), weight 159 lb 6.4 oz (72.303 kg), SpO2 100.00%.  Physical Exam  LABORATORY DATA:. Appointment on 03/03/2014  Component Date Value Ref Range Status  . WBC 03/03/2014 7.9  3.9 - 10.3 10e3/uL Final  . NEUT# 03/03/2014 5.0  1.5 - 6.5 10e3/uL Final  . HGB 03/03/2014 13.0  11.6 - 15.9 g/dL Final  . HCT 03/03/2014 39.3  34.8 - 46.6 % Final  . Platelets 03/03/2014 162  145 - 400 10e3/uL Final  . MCV 03/03/2014 90.3  79.5 - 101.0 fL Final  . MCH 03/03/2014 30.0  25.1 - 34.0 pg Final  . MCHC 03/03/2014 33.2  31.5 - 36.0 g/dL Final  . RBC 03/03/2014 4.35  3.70 - 5.45 10e6/uL Final  . RDW 03/03/2014 14.1  11.2 - 14.5 % Final  . lymph# 03/03/2014 2.6  0.9 - 3.3 10e3/uL Final  . MONO# 03/03/2014 0.3  0.1 - 0.9 10e3/uL Final  . Eosinophils Absolute 03/03/2014 0.0  0.0 - 0.5 10e3/uL Final  . Basophils Absolute 03/03/2014 0.0  0.0 - 0.1 10e3/uL Final  . NEUT% 03/03/2014 63.0  38.4 - 76.8 % Final  . LYMPH% 03/03/2014 32.3  14.0 - 49.7 % Final  .  MONO% 03/03/2014 4.0  0.0 - 14.0 % Final  . EOS% 03/03/2014 0.2  0.0 - 7.0 % Final  . BASO% 03/03/2014 0.5  0.0 - 2.0 % Final  . Sodium 03/03/2014 137  136 - 145 mEq/L Final  . Potassium 03/03/2014 3.8  3.5 - 5.1 mEq/L Final  . Chloride 03/03/2014 104  98 - 109 mEq/L Final  . CO2 03/03/2014 24  22 - 29 mEq/L Final  . Glucose 03/03/2014 97  70 - 140 mg/dl Final  . BUN 03/03/2014 4.5* 7.0 - 26.0 mg/dL Final  . Creatinine 03/03/2014 0.8  0.6 - 1.1 mg/dL Final  . Total Bilirubin 03/03/2014 0.64  0.20 - 1.20 mg/dL Final  . Alkaline Phosphatase 03/03/2014 138  40 - 150 U/L Final  . AST 03/03/2014 34  5 - 34 U/L Final  . ALT 03/03/2014 67* 0 - 55 U/L Final  . Total Protein 03/03/2014 6.5  6.4 - 8.3 g/dL Final  . Albumin 03/03/2014 3.5  3.5 - 5.0 g/dL Final  . Calcium 03/03/2014 9.0  8.4 - 10.4 mg/dL Final   . Anion Gap 03/03/2014 9  3 - 11 mEq/L Final   Rash culture:  6d ago Specimen Description  LEG   Special Requests  Immunocompromised   Gram Stain  RARE WBC PRESENT, PREDOMINANTLY PMN RARE SQUAMOUS EPITHELIAL CELLS PRESENT FEW GRAM POSITIVE RODS FEW GRAM POSITIVE COCCI IN PAIRS Performed at [facility]  Culture  MULTIPLE ORGANISMS PRESENT, NONE PREDOMINANT Note: NO STAPHYLOCOCCUS AUREUS ISOLATED NO GROUP A STREP (S.PYOGENES) ISOLATED Performed at [facility]  Report Status  02/28/2014 FINAL      RADIOGRAPHIC STUDIES: CLINICAL DATA: Cough, rash and history of breast carcinoma.  EXAM:  CHEST - 2 VIEW  COMPARISON: 01/20/2014  FINDINGS:  The heart size and mediastinal contours are within normal limits.  Stable appearance of left-sided Port-A-Cath. Probable underlying  mild chronic lung disease with bibasilar scarring. There is no  evidence of pulmonary edema, consolidation, pneumothorax, nodule or  pleural fluid. The visualized skeletal structures are unremarkable.  IMPRESSION:  No active disease.  Electronically Signed  By: Aletta Edouard M.D.  ASSESSMENT/PLAN:    Breast cancer of upper-outer quadrant of right female breast  Assessment & Plan Patient received cycle 2 of her neoadjuvant Taxotere/carboplatin/herceptin/perjeta chemotherapy regimen on 02/18/2014.  She will be due for cycle 3 of the same regimen on 03/11/2014.   Thrush of mouth and esophagus  Assessment & Plan No significant oral lesions or mucositis/thrush noted on exam today; the patient does continue to complain of some mild esophagitis.  She has almostcompleted 7 days of Diflucan oral therapy; is requesting a refill for another week.  Patient was prescribed Diflucan 100 mg on a daily basis for another 7 days.  She was also prescribed Magic mouthwash with lidocaine as well.   Hand foot syndrome  Assessment & Plan Chemotherapy -induced hand/foot syndrome once again noted. It does  appear that the cracks to patient's fingertips have slightly improved since exam last week.  Patient was once again encouraged to keep her hands/feet well moisturized; and to keep her hand/feet protected from extremes in temperature.   Rash  Assessment & Plan Patient presented to the emergency department on 02/26/2014 with a mild rash to her right groin area and to her bilateral hands.  A culture of the rash was obtained while in the emergency department; with results showing multiple organisms present; but no predominant.  No Staphylococcus aureus or group A strep isolated.  Patient was prescribed Diflucan 100 mg a day; as well as nystatin powder for rash.  Trace rash to right groin is resolving.  He significant rash has scabbed over on bilateral hands; but patient does also have continued rash on bilateral forearms to elbows.  Patient states the rash does occasionally itch.  Patient was encouraged to try Benadryl 25 mg every 6 hours and Pepcid 20 mg every 12 hours for the rash.  No other allergic symptoms noted whatsoever.   Bronchitis  Assessment & Plan Patient has been complaining of a cough and occasional wheezing for greater than a week.  She was evaluated for her cough/wheezing while in the emergency department last week; it was noted that her chest x-ray was clear with no pneumonia.  Patient has a dry cough on exam today; with no wheezing noted.  Patient was prescribed Levaquin 500 mg daily for probable bronchitis.   Anorexia  Assessment & Plan Patient continues complaining of some chronic anorexia; which is most likely secondary to chemotherapy.  The patient also continues with altered taste sensation secondary to chemotherapy as well.  Patient was encouraged to eat multiple small meals throughout the day at all possible.   Altered taste  Assessment & Plan Dysgeusia secondary to chemotherapy.  Patient encouraged to eat foods that still taste good to her.   Weight  loss  Assessment & Plan Patient continues to lose some weight.  She states that this is due to both  decreased taste sensation and lack of appetite.  Patient was encouraged to eat multiple small meals throughout the day and to eat foods is continue to taste good to her.   Insomnia  Assessment & Plan Patient does suffer with some chronic insomnia; but states that the taking lorazepam at night has helped.  She states she is finally able to sleep throughout the night; and feels much more rested.  She's requesting a refill of her lorazepam.   Transaminitis  Assessment & Plan ALT has increased from 38 up to 67.  Will continue to monitor closely.   Patient stated understanding of all instructions; and was in agreement with this plan of care. The patient knows to call the clinic with any problems, questions or concerns.   Review/collaboration with Dr.  Lindi Adie regarding all aspects of patient's visit today.   Total time spent with patient was  40 minutes;  with greater than  75 percent of that time spent in face to face counseling regarding  her symptoms, review of her emergency department visit and results, and coordination of care and follow up.  Disclaimer: This note was dictated with voice recognition software. Similar sounding words can inadvertently be transcribed and may not be corrected upon review.   Drue Second, NP 03/04/2014

## 2014-03-04 NOTE — Assessment & Plan Note (Signed)
Patient received cycle 2 of her neoadjuvant Taxotere/carboplatin/herceptin/perjeta chemotherapy regimen on 02/18/2014.  She will be due for cycle 3 of the same regimen on 03/11/2014.

## 2014-03-04 NOTE — Assessment & Plan Note (Addendum)
No significant oral lesions or mucositis/thrush noted on exam today; the patient does continue to complain of some mild esophagitis.  She has almostcompleted 7 days of Diflucan oral therapy; is requesting a refill for another week.  Patient was prescribed Diflucan 100 mg on a daily basis for another 7 days.  She was also prescribed Magic mouthwash with lidocaine as well.

## 2014-03-04 NOTE — Assessment & Plan Note (Signed)
Patient has been complaining of a cough and occasional wheezing for greater than a week.  She was evaluated for her cough/wheezing while in the emergency department last week; it was noted that her chest x-ray was clear with no pneumonia.  Patient has a dry cough on exam today; with no wheezing noted.  Patient was prescribed Levaquin 500 mg daily for probable bronchitis.

## 2014-03-04 NOTE — Assessment & Plan Note (Signed)
Dysgeusia secondary to chemotherapy.  Patient encouraged to eat foods that still taste good to her.

## 2014-03-04 NOTE — Assessment & Plan Note (Signed)
Patient continues to lose some weight.  She states that this is due to both  decreased taste sensation and lack of appetite.  Patient was encouraged to eat multiple small meals throughout the day and to eat foods is continue to taste good to her.

## 2014-03-04 NOTE — Assessment & Plan Note (Signed)
Patient does suffer with some chronic insomnia; but states that the taking lorazepam at night has helped.  She states she is finally able to sleep throughout the night; and feels much more rested.  She's requesting a refill of her lorazepam.

## 2014-03-04 NOTE — Assessment & Plan Note (Signed)
Patient continues complaining of some chronic anorexia; which is most likely secondary to chemotherapy.  The patient also continues with altered taste sensation secondary to chemotherapy as well.  Patient was encouraged to eat multiple small meals throughout the day at all possible.

## 2014-03-04 NOTE — Assessment & Plan Note (Signed)
Patient presented to the emergency department on 02/26/2014 with a mild rash to her right groin area and to her bilateral hands.  A culture of the rash was obtained while in the emergency department; with results showing multiple organisms present; but no predominant.  No Staphylococcus aureus or group A strep isolated.  Patient was prescribed Diflucan 100 mg a day; as well as nystatin powder for rash.  Trace rash to right groin is resolving.  He significant rash has scabbed over on bilateral hands; but patient does also have continued rash on bilateral forearms to elbows.  Patient states the rash does occasionally itch.  Patient was encouraged to try Benadryl 25 mg every 6 hours and Pepcid 20 mg every 12 hours for the rash.  No other allergic symptoms noted whatsoever.

## 2014-03-04 NOTE — Assessment & Plan Note (Signed)
Chemotherapy -induced hand/foot syndrome once again noted. It does appear that the cracks to patient's fingertips have slightly improved since exam last week.  Patient was once again encouraged to keep her hands/feet well moisturized; and to keep her hand/feet protected from extremes in temperature.

## 2014-03-04 NOTE — Assessment & Plan Note (Signed)
ALT has increased from 38 up to 67.  Will continue to monitor closely.

## 2014-03-09 ENCOUNTER — Telehealth: Payer: Self-pay

## 2014-03-09 ENCOUNTER — Telehealth: Payer: Self-pay | Admitting: Medical Oncology

## 2014-03-09 ENCOUNTER — Ambulatory Visit (HOSPITAL_BASED_OUTPATIENT_CLINIC_OR_DEPARTMENT_OTHER): Payer: Self-pay | Admitting: Nurse Practitioner

## 2014-03-09 VITALS — BP 129/79 | HR 82 | Temp 97.7°F | Resp 20 | Ht 64.0 in | Wt 167.8 lb

## 2014-03-09 DIAGNOSIS — C50411 Malignant neoplasm of upper-outer quadrant of right female breast: Secondary | ICD-10-CM

## 2014-03-09 DIAGNOSIS — L271 Localized skin eruption due to drugs and medicaments taken internally: Secondary | ICD-10-CM

## 2014-03-09 DIAGNOSIS — C50811 Malignant neoplasm of overlapping sites of right female breast: Secondary | ICD-10-CM

## 2014-03-09 MED ORDER — TRIAMCINOLONE ACETONIDE 0.1 % EX CREA
1.0000 | TOPICAL_CREAM | Freq: Three times a day (TID) | CUTANEOUS | Status: DC
Start: 2014-03-09 — End: 2014-04-08

## 2014-03-09 MED ORDER — DOXYCYCLINE HYCLATE 100 MG PO TABS
100.0000 mg | ORAL_TABLET | Freq: Two times a day (BID) | ORAL | Status: DC
Start: 2014-03-09 — End: 2014-03-28

## 2014-03-09 NOTE — Telephone Encounter (Signed)
Msg rcvd from patient re: hands.  Pt reported her hands are red and look as if they were put in scalding water.  Per Dr Lindi Adie, have pt see Retta Mac.  Spoke with Theatre manager to schedule

## 2014-03-09 NOTE — Telephone Encounter (Signed)
Per Dr. Geralyn Flash desk nurse, Karna Christmas, patient to be evaluated today with NP, Selena Lesser. Patient informed and will be here for 1:30 appt.   POF sent.

## 2014-03-09 NOTE — Telephone Encounter (Signed)
Patient called to inform [name] Lesser, NP that her "hands are still in terrible shape. Looks like someone put them in hot water, itch and blistered." Patient reports has been using lotion and powder as instructed. Patient states it is just on her hands, her arms have cleared up and that she doesn't want this to hold up her treatment that is scheduled for this Friday , "want to stay on schedule with my treatments." Patient states has not been taking benadryl as instructed because she cannot afford it, until Friday when she gets paid. Patient wants to make sure she doesn't need to be re assessed for this and reiterated several times that she does not want to delay her treatments because of this.  Informed patient that she has an appt with Dr. Lindi Adie this Friday prior to treatment and more than likely will assess hands at that time. Informed patient will review with MD and NP and call patient with additional instructions if given.   LOV 03/03/2014 F/U Labs/MD/Tx

## 2014-03-10 ENCOUNTER — Telehealth: Payer: Self-pay | Admitting: *Deleted

## 2014-03-10 ENCOUNTER — Encounter: Payer: Self-pay | Admitting: Nurse Practitioner

## 2014-03-10 ENCOUNTER — Other Ambulatory Visit: Payer: Self-pay

## 2014-03-10 DIAGNOSIS — C50411 Malignant neoplasm of upper-outer quadrant of right female breast: Secondary | ICD-10-CM

## 2014-03-10 NOTE — Assessment & Plan Note (Signed)
Patient's hand/foot syndrome continues.  As the cracking to her fingertips does appear to be slowly resolving.  However, the rash to her bilateral hands and wrists continues.  Advised patient will most likely hold the Taxotere portion of her chemotherapy regimen when she returns for chemotherapy this coming Friday, 03/11/2014.  Also, prescribed doxycycline for patient to take on a twice-daily basis.  Also prescribed triamcinolone cream to apply to her rash.

## 2014-03-10 NOTE — Progress Notes (Signed)
SYMPTOM MANAGEMENT CLINIC   HPI: [name] 58 y.o. female diagnosed with breast cancer.  Currently undergoing neoadjuvant Taxotere/carboplatinum/Herceptin/Perjeta chemotherapy regimen.  Patient called the cancer Center today requesting urgent care visit.  She has been suffering with some chronic hand/foot syndrome since initiating her chemotherapy regimen.  She states that the cracking to her fingertips is slowly improving; but her overall hand/wrist rash and irritation continues.  She denies any other new symptoms whatsoever.  She denies any recent fevers or chills.   Rash    CURRENT THERAPY: Upcoming Treatment Dates - BREAST TCH q21d (order Herceptin separately) Days with orders from any treatment category:  03/11/2014      SCHEDULING COMMUNICATION      ondansetron (ZOFRAN) IVPB 16 mg      Dexamethasone Sodium Phosphate (DECADRON) injection 20 mg      DOCEtaxel (TAXOTERE) 140 mg in dextrose 5 % 250 mL chemo infusion      CARBOplatin (PARAPLATIN) 700 mg in sodium chloride 0.9 % 250 mL chemo infusion      sodium chloride 0.9 % injection 10 mL      heparin lock flush 100 unit/mL      heparin lock flush 100 unit/mL      alteplase (CATHFLO ACTIVASE) injection 2 mg      sodium chloride 0.9 % injection 3 mL      Cold Pack 1 packet      diphenhydrAMINE (BENADRYL) injection 25 mg      famotidine (PEPCID) IVPB 20 mg      0.9 %  sodium chloride infusion      methylPREDNISolone sodium succinate (SOLU-MEDROL) 125 mg/2 mL injection 125 mg      EPINEPHrine (ADRENALIN) 0.1 MG/ML injection 0.25 mg      EPINEPHrine (ADRENALIN) 0.1 MG/ML injection 0.25 mg      EPINEPHrine (ADRENALIN) injection 0.5 mg      EPINEPHrine (ADRENALIN) injection 0.5 mg      diphenhydrAMINE (BENADRYL) injection 50 mg      albuterol (PROVENTIL) (2.5 MG/3ML) 0.083% nebulizer solution 2.5 mg      0.9 %  sodium chloride infusion      TREATMENT CONDITIONS 03/12/2014      SCHEDULING COMMUNICATION INJECTION  pegfilgrastim (NEULASTA) injection 6 mg 04/01/2014      SCHEDULING COMMUNICATION      ondansetron (ZOFRAN) IVPB 16 mg      Dexamethasone Sodium Phosphate (DECADRON) injection 20 mg      DOCEtaxel (TAXOTERE) 140 mg in dextrose 5 % 250 mL chemo infusion      CARBOplatin (PARAPLATIN) 700 mg in sodium chloride 0.9 % 250 mL chemo infusion      sodium chloride 0.9 % injection 10 mL      heparin lock flush 100 unit/mL      heparin lock flush 100 unit/mL      alteplase (CATHFLO ACTIVASE) injection 2 mg      sodium chloride 0.9 % injection 3 mL      Cold Pack 1 packet      diphenhydrAMINE (BENADRYL) injection 25 mg      famotidine (PEPCID) IVPB 20 mg      0.9 %  sodium chloride infusion      methylPREDNISolone sodium succinate (SOLU-MEDROL) 125 mg/2 mL injection 125 mg      EPINEPHrine (ADRENALIN) 0.1 MG/ML injection 0.25 mg      EPINEPHrine (ADRENALIN) 0.1 MG/ML injection 0.25 mg      EPINEPHrine (ADRENALIN) injection 0.5 mg  EPINEPHrine (ADRENALIN) injection 0.5 mg      diphenhydrAMINE (BENADRYL) injection 50 mg      albuterol (PROVENTIL) (2.5 MG/3ML) 0.083% nebulizer solution 2.5 mg      0.9 %  sodium chloride infusion      TREATMENT CONDITIONS    Review of Systems  Constitutional: Negative.   HENT: Negative.   Eyes: Negative.   Respiratory: Negative.   Cardiovascular: Negative.   Gastrointestinal: Negative.   Genitourinary: Negative.   Musculoskeletal: Negative.   Skin: Positive for rash. Negative for itching.  Neurological: Negative.   Endo/Heme/Allergies: Negative.   Psychiatric/Behavioral: Negative.   All other systems reviewed and are negative.   Past Medical History  Diagnosis Date  . 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), hypoglycemia 2/04-resolved with PO calcium + D, SVT, was treated with iterferon and ribavirin-10/3-2/04  . GERD (gastroesophageal reflux disease)   . PUD (peptic  ulcer disease)   . Fibromyalgia   . Rheumatoid arthritis(714.0)     possible  . SVT (supraventricular tachycardia)   . Depression   . Osteoporosis   . Hepatitis C     Pt was given too much interferon - critical care x2d, 5 days total hosp  . Anxiety   . Wears glasses   . Heavy smoker     Past Surgical History  Procedure Laterality Date  . Cesarean section  09/17/80  . Dilation and curettage of uterus    . Hysterectomy-for dysmenorrhea  09/17/00  . Nasal sinus surgery  03/20/84    for deviated septum  . Tonsillectomy  05/20/61  . Abdominal hysterectomy    . Colonoscopy    . Upper gi endoscopy    . Portacath placement Left 01/20/2014    Procedure: INSERTION PORT-A-CATH;  Surgeon: [name], MD;  Location: [facility];  Service: General;  Laterality: Left;    has HEPATITIS B; HEPATITIS C; TOBACCO ABUSE; PSVT; GASTROESOPHAGEAL REFLUX, NO ESOPHAGITIS; FIBROMYALGIA; Rheumatoid arthritis; Breast cancer of upper-outer quadrant of right female breast; Thrush of mouth and esophagus; Diarrhea due to drug; Extremity edema; Dehydration; Hand foot syndrome; Rash; Bronchitis; Anorexia; Altered taste; Weight loss; Insomnia; and Transaminitis on her problem list.     is allergic to codeine.    Medication List       This list is accurate as of: 03/09/14 11:59 PM.  Always use your most recent med list.               ALPRAZolam 0.5 MG tablet  Commonly known as:  XANAX  Take 1 tablet (0.5 mg total) by mouth 3 (three) times daily as needed for anxiety (anxiety).     atenolol 50 MG tablet  Commonly known as:  TENORMIN  Take 50 mg by mouth 2 (two) times daily.     dexamethasone 4 MG tablet  Commonly known as:  DECADRON  TAKE 2 TABLETS BY MOUTH 2 TIMES A DAY. START THE DAY BEFORE TAXOTERE, THEN AGAIN THE DAY AFTER CHEMO FOR 3 DAYS     doxycycline 100 MG tablet  Commonly known as:  VIBRA-TABS  Take 1 tablet (100 mg total) by mouth 2 (two) times daily.     fluconazole 100 MG  tablet  Commonly known as:  DIFLUCAN  Take 1 tablet (100 mg total) by mouth daily.     levofloxacin 500 MG tablet  Commonly known as:  LEVAQUIN  Take 1 tablet (500 mg total) by mouth daily.  lidocaine-prilocaine cream  Commonly known as:  EMLA  Apply 1 application topically as needed (port a cath site). Apply to [facility] a cath site 2 hours prior to chemotherapy appointment     LORazepam 0.5 MG tablet  Commonly known as:  ATIVAN  Take 2 tablets (1 mg total) by mouth at bedtime.     magic mouthwash w/lidocaine Soln  Take 5 mLs by mouth 4 (four) times daily as needed for mouth pain (duke's formula with nystatin and lidocaine; all in 1:1 combo please.).     naproxen sodium 220 MG tablet  Commonly known as:  ANAPROX  Take 440-660 mg by mouth 2 (two) times daily.     nystatin 100000 UNIT/GM Powd  Apply to the affected area daily until resolved.     omeprazole 20 MG capsule  Commonly known as:  PRILOSEC  Take 20 mg by mouth every evening.     ondansetron 8 MG tablet  Commonly known as:  ZOFRAN  Take 1 tablet (8 mg total) by mouth every 8 (eight) hours as needed for nausea or vomiting (nausea).     prochlorperazine 10 MG tablet  Commonly known as:  COMPAZINE  Take 1 tablet (10 mg total) by mouth every 6 (six) hours as needed (Nausea or vomiting).     ROXICET 5-325 MG per tablet  Generic drug:  oxyCODONE-acetaminophen  Take 1 tablet by mouth every 4 (four) hours as needed for severe pain (pain).     sertraline 50 MG tablet  Commonly known as:  ZOLOFT  Take 1 tablet (50 mg total) by mouth at bedtime.     triamcinolone cream 0.1 %  Commonly known as:  KENALOG  Apply 1 application topically 3 (three) times daily.         PHYSICAL EXAMINATION  Blood pressure 129/79, pulse 82, temperature 97.7 F (36.5 C), temperature source Oral, resp. rate 20, height 5\' 4"  (1.626 m), weight 167 lb 12.8 oz (76.114 kg).  Physical Exam  Nursing note and vitals reviewed. Constitutional:  She is oriented to person, place, and time and well-developed, well-nourished, and in no distress.  HENT:  Head: Normocephalic.  Mouth/Throat: Oropharynx is clear and moist.  Eyes: Conjunctivae and EOM are normal. Pupils are equal, round, and reactive to light. No scleral icterus.  Neck: Normal range of motion. Neck supple. No JVD present. No tracheal deviation present. No thyromegaly present.  Cardiovascular: Normal rate, regular rhythm, normal heart sounds and intact distal pulses.   Pulmonary/Chest: Effort normal and breath sounds normal. No respiratory distress. She has no wheezes.  Abdominal: Soft. Bowel sounds are normal. She exhibits no distension. There is no tenderness. There is no rebound.  Musculoskeletal: Normal range of motion. She exhibits no edema and no tenderness.  Lymphadenopathy:    She has no cervical adenopathy.  Neurological: She is alert and oriented to person, place, and time. Gait normal.  Skin: Skin is warm and dry. Rash noted. There is erythema.  Patient has cracks to multiple fingertips; do appear to be healing.  Bilateral hands up to wrist with erythema and scabbed lesions.  Palms of hands within normal limits.  Psychiatric: Affect normal.    LABORATORY DATA:. No visits with results within 3 Day(s) from this visit. Latest known visit with results is:  Appointment on 03/03/2014  Component Date Value Ref Range Status  . WBC 03/03/2014 7.9  3.9 - 10.3 10e3/uL Final  . NEUT# 03/03/2014 5.0  1.5 - 6.5 10e3/uL Final  . HGB 03/03/2014  13.0  11.6 - 15.9 g/dL Final  . HCT 03/03/2014 39.3  34.8 - 46.6 % Final  . Platelets 03/03/2014 162  145 - 400 10e3/uL Final  . MCV 03/03/2014 90.3  79.5 - 101.0 fL Final  . MCH 03/03/2014 30.0  25.1 - 34.0 pg Final  . MCHC 03/03/2014 33.2  31.5 - 36.0 g/dL Final  . RBC 03/03/2014 4.35  3.70 - 5.45 10e6/uL Final  . RDW 03/03/2014 14.1  11.2 - 14.5 % Final  . lymph# 03/03/2014 2.6  0.9 - 3.3 10e3/uL Final  . MONO# 03/03/2014 0.3   0.1 - 0.9 10e3/uL Final  . Eosinophils Absolute 03/03/2014 0.0  0.0 - 0.5 10e3/uL Final  . Basophils Absolute 03/03/2014 0.0  0.0 - 0.1 10e3/uL Final  . NEUT% 03/03/2014 63.0  38.4 - 76.8 % Final  . LYMPH% 03/03/2014 32.3  14.0 - 49.7 % Final  . MONO% 03/03/2014 4.0  0.0 - 14.0 % Final  . EOS% 03/03/2014 0.2  0.0 - 7.0 % Final  . BASO% 03/03/2014 0.5  0.0 - 2.0 % Final  . Sodium 03/03/2014 137  136 - 145 mEq/L Final  . Potassium 03/03/2014 3.8  3.5 - 5.1 mEq/L Final  . Chloride 03/03/2014 104  98 - 109 mEq/L Final  . CO2 03/03/2014 24  22 - 29 mEq/L Final  . Glucose 03/03/2014 97  70 - 140 mg/dl Final  . BUN 03/03/2014 4.5* 7.0 - 26.0 mg/dL Final  . Creatinine 03/03/2014 0.8  0.6 - 1.1 mg/dL Final  . Total Bilirubin 03/03/2014 0.64  0.20 - 1.20 mg/dL Final  . Alkaline Phosphatase 03/03/2014 138  40 - 150 U/L Final  . AST 03/03/2014 34  5 - 34 U/L Final  . ALT 03/03/2014 67* 0 - 55 U/L Final  . Total Protein 03/03/2014 6.5  6.4 - 8.3 g/dL Final  . Albumin 03/03/2014 3.5  3.5 - 5.0 g/dL Final  . Calcium 03/03/2014 9.0  8.4 - 10.4 mg/dL Final  . Anion Gap 03/03/2014 9  3 - 11 mEq/L Final     RADIOGRAPHIC STUDIES: No results found.  ASSESSMENT/PLAN:    Breast cancer of upper-outer quadrant of right female breast  Assessment & Plan Patient received cycle 2 of her neoadjuvant Taxotere/carboplatin/Herceptin/Perjeta chemotherapy regimen on 02/18/2014.  She is scheduled for cycle 3 of the same regimen on 03/11/2014.  Due to continued, chronic hand/foot syndrome; advised patient that we'll most likely hold Taxotere portion of her chemotherapy regimen   when she returns for cycle 3 of her chemotherapy this coming Friday, 03/11/2014.   Hand foot syndrome  Assessment & Plan Patient's hand/foot syndrome continues.  As the cracking to her fingertips does appear to be slowly resolving.  However, the rash to her bilateral hands and wrists continues.  Advised patient will most likely hold the  Taxotere portion of her chemotherapy regimen when she returns for chemotherapy this coming Friday, 03/11/2014.  Also, prescribed doxycycline for patient to take on a twice-daily basis.  Also prescribed triamcinolone cream to apply to her rash.   Patient stated understanding of all instructions; and was in agreement with this plan of care. The patient knows to call the clinic with any problems, questions or concerns.   Review/collaboration with Dr. Lindi Adie regarding all aspects of patient's visit today.   Total time spent with patient was 25 minutes;  with greater than 75 percent of that time spent in face to face counseling regarding her symptoms, and coordination of care and follow  up.  Disclaimer: This note was dictated with voice recognition software. Similar sounding words can inadvertently be transcribed and may not be corrected upon review.   Drue Second, NP 03/10/2014

## 2014-03-10 NOTE — Telephone Encounter (Signed)
Per pharmacy I have scheduled appt 

## 2014-03-10 NOTE — Assessment & Plan Note (Addendum)
Patient received cycle 2 of her neoadjuvant Taxotere/carboplatin/Herceptin/Perjeta chemotherapy regimen on 02/18/2014.  She is scheduled for cycle 3 of the same regimen on 03/11/2014.  Due to continued, chronic hand/foot syndrome; advised patient that we'll most likely hold Taxotere portion of her chemotherapy regimen   when she returns for cycle 3 of her chemotherapy this coming Friday, 03/11/2014.

## 2014-03-11 ENCOUNTER — Telehealth: Payer: Self-pay | Admitting: Hematology and Oncology

## 2014-03-11 ENCOUNTER — Ambulatory Visit (HOSPITAL_BASED_OUTPATIENT_CLINIC_OR_DEPARTMENT_OTHER): Payer: Self-pay | Admitting: Hematology and Oncology

## 2014-03-11 ENCOUNTER — Ambulatory Visit: Payer: Self-pay

## 2014-03-11 ENCOUNTER — Other Ambulatory Visit (HOSPITAL_BASED_OUTPATIENT_CLINIC_OR_DEPARTMENT_OTHER): Payer: Self-pay

## 2014-03-11 VITALS — BP 119/79 | HR 75 | Temp 98.2°F | Resp 18 | Ht 64.0 in | Wt 167.1 lb

## 2014-03-11 DIAGNOSIS — C50411 Malignant neoplasm of upper-outer quadrant of right female breast: Secondary | ICD-10-CM

## 2014-03-11 LAB — CBC WITH DIFFERENTIAL/PLATELET
BASO%: 0.2 % (ref 0.0–2.0)
Basophils Absolute: 0 10*3/uL (ref 0.0–0.1)
EOS ABS: 0.1 10*3/uL (ref 0.0–0.5)
EOS%: 1 % (ref 0.0–7.0)
HEMATOCRIT: 35.5 % (ref 34.8–46.6)
HGB: 11.8 g/dL (ref 11.6–15.9)
LYMPH%: 30 % (ref 14.0–49.7)
MCH: 30 pg (ref 25.1–34.0)
MCHC: 33.2 g/dL (ref 31.5–36.0)
MCV: 90.3 fL (ref 79.5–101.0)
MONO#: 0.7 10*3/uL (ref 0.1–0.9)
MONO%: 7.5 % (ref 0.0–14.0)
NEUT%: 61.3 % (ref 38.4–76.8)
NEUTROS ABS: 5.8 10*3/uL (ref 1.5–6.5)
Platelets: 265 10*3/uL (ref 145–400)
RBC: 3.93 10*6/uL (ref 3.70–5.45)
RDW: 15.4 % — ABNORMAL HIGH (ref 11.2–14.5)
WBC: 9.4 10*3/uL (ref 3.9–10.3)
lymph#: 2.8 10*3/uL (ref 0.9–3.3)

## 2014-03-11 LAB — COMPREHENSIVE METABOLIC PANEL (CC13)
ALK PHOS: 151 U/L — AB (ref 40–150)
ALT: 43 U/L (ref 0–55)
AST: 29 U/L (ref 5–34)
Albumin: 3.3 g/dL — ABNORMAL LOW (ref 3.5–5.0)
Anion Gap: 9 mEq/L (ref 3–11)
BILIRUBIN TOTAL: 0.46 mg/dL (ref 0.20–1.20)
BUN: 4.2 mg/dL — AB (ref 7.0–26.0)
CO2: 23 meq/L (ref 22–29)
Calcium: 8.7 mg/dL (ref 8.4–10.4)
Chloride: 105 mEq/L (ref 98–109)
Creatinine: 0.8 mg/dL (ref 0.6–1.1)
Glucose: 100 mg/dl (ref 70–140)
Potassium: 4.2 mEq/L (ref 3.5–5.1)
Sodium: 138 mEq/L (ref 136–145)
Total Protein: 5.9 g/dL — ABNORMAL LOW (ref 6.4–8.3)

## 2014-03-11 MED ORDER — OXYCODONE-ACETAMINOPHEN 5-325 MG PO TABS: 1.0000 | ORAL_TABLET | ORAL | Status: DC | PRN

## 2014-03-11 NOTE — Progress Notes (Signed)
Patient Care Team: Darreld Mclean, MD as PCP - General (Family Medicine) Rulon Eisenmenger, MD as Consulting Physician (Hematology and Oncology) [name], MD as Consulting Physician (General Surgery) Thea Silversmith, MD as Consulting Physician (Radiation Oncology)  DIAGNOSIS: Breast cancer of upper-outer quadrant of right female breast   Primary site: Breast (Right)   Staging method: AJCC 7th Edition   Clinical: Stage IIA (T2, N0, cM0)   Summary: Stage IIA (T2, N0, cM0)   Clinical comments: Staged at breast conference 12/29/13.   SUMMARY OF ONCOLOGIC HISTORY:   Breast cancer of upper-outer quadrant of right female breast   12/16/2013 Mammogram 1. A palpable 4.1 cm diameter mass/asymmetry with associated coarse heterogeneous microcalcifications at 11 o'clock 2 cm from the right nipple demonstrates mammographic and sonographic features highly suggestive of malignancy.    12/16/2013 Initial Biopsy Breast cancer of upper-outer quadrant of right female breast; Invasive Ductal Cancer Er 100%, PR: 0%; Her2 Positive Ratio 4.28: Ki 67:24%;    12/23/2013 Breast MRI Right Breast 4.4 x 3 x 2.5 cm and a sub cm satellite lesion. No LN   01/28/2014 -  Neo-Adjuvant Chemotherapy Taxotere, carboplatin, Herceptin, Perjeta every 3 weeks , after 2 cycles patient developed hand exfoliating rash related to Taxotere. Chemotherapy changed to gemcitabine, carboplatin, Herceptin, Perjeta from 03/18/2014    CHIEF COMPLIANT: Severe hand exfoliating dermatitis  INTERVAL HISTORY: [name] is a 58 year old Caucasian with above-mentioned history of right breast cancer currently being treated with neoadjuvant chemotherapy with Taxotere, carboplatin, Herceptin, Perjeta. She receives cycle 2 and developed profound rash on the dorsum of her hands. We prescribed her doxycycline orally along with topical triamcinolone. Since then her rash is getting better. She is not having significant pain. She is also here to discuss  further treatment plan with regards to her neoadjuvant therapy.  REVIEW OF SYSTEMS:   Constitutional: Denies fevers, chills or abnormal weight loss Eyes: Denies blurriness of vision Ears, nose, mouth, throat, and face: Denies mucositis or sore throat Respiratory: Denies cough, dyspnea or wheezes Cardiovascular: Denies palpitation, chest discomfort or lower extremity swelling Gastrointestinal:  Denies nausea, heartburn or change in bowel habits Skin: Denies abnormal skin rashes Lymphatics: Denies new lymphadenopathy or easy bruising Neurological:Denies numbness, tingling or new weaknesses Behavioral/Psych: Mood is stable, no new changes  Breast: Breast nodule is smaller All other systems were reviewed with the patient and are negative.  I have reviewed the past medical history, past surgical history, social history and family history with the patient and they are unchanged from previous note.  ALLERGIES:  is allergic to codeine.  MEDICATIONS:  Current Outpatient Prescriptions  Medication Sig Dispense Refill  . ALPRAZolam (XANAX) 0.5 MG tablet Take 1 tablet (0.5 mg total) by mouth 3 (three) times daily as needed for anxiety (anxiety).  90 tablet  0  . Alum & Mag Hydroxide-Simeth (MAGIC MOUTHWASH W/LIDOCAINE) SOLN Take 5 mLs by mouth 4 (four) times daily as needed for mouth pain (duke's formula with nystatin and lidocaine; all in 1:1 combo please.).  240 mL  1  . atenolol (TENORMIN) 50 MG tablet Take 50 mg by mouth 2 (two) times daily.      Marland Kitchen dexamethasone (DECADRON) 4 MG tablet TAKE 2 TABLETS BY MOUTH 2 TIMES A DAY. START THE DAY BEFORE TAXOTERE, THEN AGAIN THE DAY AFTER CHEMO FOR 3 DAYS  30 tablet  1  . doxycycline (VIBRA-TABS) 100 MG tablet Take 1 tablet (100 mg total) by mouth 2 (two) times daily.  20 tablet  0  . fluconazole (DIFLUCAN) 100 MG tablet Take 1 tablet (100 mg total) by mouth daily.  7 tablet  0  . lidocaine-prilocaine (EMLA) cream Apply 1 application topically as needed  (port a cath site). Apply to [facility] a cath site 2 hours prior to chemotherapy appointment      . LORazepam (ATIVAN) 0.5 MG tablet Take 2 tablets (1 mg total) by mouth at bedtime.  30 tablet  0  . naproxen sodium (ANAPROX) 220 MG tablet Take 440-660 mg by mouth 2 (two) times daily.       Marland Kitchen nystatin (MYCOSTATIN/NYSTOP) 100000 UNIT/GM POWD Apply to the affected area daily until resolved.  60 g  1  . omeprazole (PRILOSEC) 20 MG capsule Take 20 mg by mouth every evening.       . ondansetron (ZOFRAN) 8 MG tablet Take 1 tablet (8 mg total) by mouth every 8 (eight) hours as needed for nausea or vomiting (nausea).  30 tablet  1  . oxyCODONE-acetaminophen (ROXICET) 5-325 MG per tablet Take 1 tablet by mouth every 4 (four) hours as needed for severe pain (pain).  30 tablet  0  . prochlorperazine (COMPAZINE) 10 MG tablet Take 1 tablet (10 mg total) by mouth every 6 (six) hours as needed (Nausea or vomiting).  30 tablet  1  . sertraline (ZOLOFT) 50 MG tablet Take 1 tablet (50 mg total) by mouth at bedtime.  90 tablet  3  . triamcinolone cream (KENALOG) 0.1 % Apply 1 application topically 3 (three) times daily.  30 g  0   No current facility-administered medications for this visit.    PHYSICAL EXAMINATION: ECOG PERFORMANCE STATUS: 1 - Symptomatic but completely ambulatory  Filed Vitals:   03/11/14 0922  BP: 119/79  Pulse: 75  Temp: 98.2 F (36.8 C)  Resp: 18   Filed Weights   03/11/14 0922  Weight: 167 lb 1.6 oz (75.796 kg)    GENERAL:alert, no distress and comfortable SKIN: Exfoliative dermatitis on the dorsum of both hands along with cracked nails and fingertips with cracks. EYES: normal, Conjunctiva are pink and non-injected, sclera clear OROPHARYNX:no exudate, no erythema and lips, buccal mucosa, and tongue normal  NECK: supple, thyroid normal size, non-tender, without nodularity LYMPH:  no palpable lymphadenopathy in the cervical, axillary or inguinal LUNGS: clear to auscultation and  percussion with normal breathing effort HEART: regular rate & rhythm and no murmurs and no lower extremity edema ABDOMEN:abdomen soft, non-tender and normal bowel sounds Musculoskeletal:no cyanosis of digits and no clubbing  NEURO: alert & oriented x 3 with fluent speech, no focal motor/sensory deficits BREAST: Right breast mass was palpated it is felt to be significantly smaller than before. No palpable axillary supraclavicular or infraclavicular adenopathy no breast tenderness or nipple discharge.   LABORATORY DATA:  I have reviewed the data as listed   Chemistry      Component Value Date/Time   NA 138 03/11/2014 0914   NA 130* 02/26/2014 1238   K 4.2 03/11/2014 0914   K 4.0 02/26/2014 1238   CL 96 02/26/2014 1238   CO2 23 03/11/2014 0914   CO2 20 02/26/2014 1238   BUN 4.2* 03/11/2014 0914   BUN 7 02/26/2014 1238   CREATININE 0.8 03/11/2014 0914   CREATININE 0.59 02/26/2014 1238   CREATININE 0.76 04/03/2013 1420      Component Value Date/Time   CALCIUM 8.7 03/11/2014 0914   CALCIUM 8.6 02/26/2014 1238   ALKPHOS 151* 03/11/2014 0914   ALKPHOS 151* 02/26/2014 1238  AST 29 03/11/2014 0914   AST 25 02/26/2014 1238   ALT 43 03/11/2014 0914   ALT 38* 02/26/2014 1238   BILITOT 0.46 03/11/2014 0914   BILITOT 0.7 02/26/2014 1238       Lab Results  Component Value Date   WBC 9.4 03/11/2014   HGB 11.8 03/11/2014   HCT 35.5 03/11/2014   MCV 90.3 03/11/2014   PLT 265 03/11/2014   NEUTROABS 5.8 03/11/2014   ASSESSMENT & PLAN:  Breast cancer of 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  Patient received 2 cycles of Taxotere, carboplatin, trastuzumab, Pertuzumab end of a severe rash on the dorsum of her hands it was  exfoliating dermatitis similar to hand-foot syndrome. We started her on oral doxycycline and topical triamcinolone. Since we did that her rash is improving remarkably well.  Because of hand-foot syndrome, we will not be able to continue Taxotere. After much discussion, we decided to switch her chemotherapy to gemcitabine carboplatin trastuzumab and Pertuzumab from cycle 3 which will be postponed by a week to allow the healing of the hands. I discussed the risks and benefits of gemcitabine including the risk of nausea, cytopenias especially thrombocytopenia. We will only give gemcitabine on day 1 once every 3 weeks. Our plan is to still continue and finish all 6 cycles of treatment including the first 2.  Today's breast exam revealed that the mass is much smaller than before. So clinically she is responding to treatment. Hence I do not believe there is any need for interim MRIs.   No orders of the defined types were placed in this encounter.   The patient has a good understanding of the overall plan. she agrees with it. She will call with any problems that may develop before her next visit here.  I spent 20 minutes counseling the patient face to face. The total time spent in the appointment was 25 minutes and more than 50% was on counseling and review of test results    Rulon Eisenmenger, MD 03/11/2014 11:19 AM

## 2014-03-11 NOTE — Telephone Encounter (Signed)
gv pt appt schedule for oct. per 10/23 pof delay tx til next wk. no further appt on schedule - added lb/fu to 10/30 tx.

## 2014-03-11 NOTE — Assessment & Plan Note (Signed)
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  Patient received 2 cycles of Taxotere, carboplatin, trastuzumab, Pertuzumab end of a severe rash on the dorsum of her hands it was exfoliating dermatitis similar to hand-foot syndrome. We started her on oral doxycycline and topical triamcinolone. Since we did that her rash is improving remarkably well.  Because of hand-foot syndrome, we will not be able to continue Taxotere. After much discussion, we decided to switch her chemotherapy to gemcitabine carboplatin trastuzumab and Pertuzumab from cycle 3 which will be postponed by a week to allow the healing of the hands. I discussed the risks and benefits of gemcitabine including the risk of nausea, cytopenias especially thrombocytopenia. We will only give gemcitabine on day 1 once every 3 weeks. Our plan is to still continue and finish all 6 cycles of treatment including the first 2.  Today's breast exam revealed that the mass is much smaller than before. So clinically she is responding to treatment. Hence I do not believe there is any need for interim MRIs.

## 2014-03-12 ENCOUNTER — Ambulatory Visit: Payer: Self-pay

## 2014-03-17 ENCOUNTER — Other Ambulatory Visit: Payer: Self-pay | Admitting: *Deleted

## 2014-03-17 DIAGNOSIS — C50411 Malignant neoplasm of upper-outer quadrant of right female breast: Secondary | ICD-10-CM

## 2014-03-18 ENCOUNTER — Ambulatory Visit (HOSPITAL_BASED_OUTPATIENT_CLINIC_OR_DEPARTMENT_OTHER): Payer: Medicaid Other

## 2014-03-18 ENCOUNTER — Ambulatory Visit (HOSPITAL_BASED_OUTPATIENT_CLINIC_OR_DEPARTMENT_OTHER): Payer: Self-pay | Admitting: Hematology and Oncology

## 2014-03-18 ENCOUNTER — Telehealth: Payer: Self-pay | Admitting: Hematology and Oncology

## 2014-03-18 ENCOUNTER — Telehealth: Payer: Self-pay | Admitting: *Deleted

## 2014-03-18 ENCOUNTER — Ambulatory Visit (HOSPITAL_BASED_OUTPATIENT_CLINIC_OR_DEPARTMENT_OTHER): Payer: Medicaid Other | Admitting: Hematology and Oncology

## 2014-03-18 VITALS — BP 134/72 | HR 68 | Temp 98.4°F | Resp 18 | Ht 64.0 in | Wt 166.1 lb

## 2014-03-18 DIAGNOSIS — Z170 Estrogen receptor positive status [ER+]: Secondary | ICD-10-CM

## 2014-03-18 DIAGNOSIS — C50411 Malignant neoplasm of upper-outer quadrant of right female breast: Secondary | ICD-10-CM

## 2014-03-18 DIAGNOSIS — L271 Localized skin eruption due to drugs and medicaments taken internally: Secondary | ICD-10-CM | POA: Insufficient documentation

## 2014-03-18 DIAGNOSIS — Z5112 Encounter for antineoplastic immunotherapy: Secondary | ICD-10-CM

## 2014-03-18 DIAGNOSIS — Z5111 Encounter for antineoplastic chemotherapy: Secondary | ICD-10-CM

## 2014-03-18 LAB — CBC WITH DIFFERENTIAL/PLATELET
BASO%: 0.4 % (ref 0.0–2.0)
BASOS ABS: 0 10*3/uL (ref 0.0–0.1)
EOS ABS: 0.1 10*3/uL (ref 0.0–0.5)
EOS%: 0.6 % (ref 0.0–7.0)
HCT: 37.9 % (ref 34.8–46.6)
HEMOGLOBIN: 12.6 g/dL (ref 11.6–15.9)
LYMPH#: 2.4 10*3/uL (ref 0.9–3.3)
LYMPH%: 22.6 % (ref 14.0–49.7)
MCH: 30.4 pg (ref 25.1–34.0)
MCHC: 33.3 g/dL (ref 31.5–36.0)
MCV: 91.1 fL (ref 79.5–101.0)
MONO#: 0.6 10*3/uL (ref 0.1–0.9)
MONO%: 6 % (ref 0.0–14.0)
NEUT%: 70.4 % (ref 38.4–76.8)
NEUTROS ABS: 7.4 10*3/uL — AB (ref 1.5–6.5)
PLATELETS: 378 10*3/uL (ref 145–400)
RBC: 4.16 10*6/uL (ref 3.70–5.45)
RDW: 15.5 % — ABNORMAL HIGH (ref 11.2–14.5)
WBC: 10.4 10*3/uL — AB (ref 3.9–10.3)

## 2014-03-18 LAB — COMPREHENSIVE METABOLIC PANEL (CC13)
ALT: 23 U/L (ref 0–55)
AST: 23 U/L (ref 5–34)
Albumin: 3.2 g/dL — ABNORMAL LOW (ref 3.5–5.0)
Alkaline Phosphatase: 140 U/L (ref 40–150)
Anion Gap: 11 mEq/L (ref 3–11)
BUN: 4.9 mg/dL — ABNORMAL LOW (ref 7.0–26.0)
CALCIUM: 8.8 mg/dL (ref 8.4–10.4)
CHLORIDE: 106 meq/L (ref 98–109)
CO2: 24 mEq/L (ref 22–29)
Creatinine: 0.8 mg/dL (ref 0.6–1.1)
GLUCOSE: 111 mg/dL (ref 70–140)
Potassium: 4 mEq/L (ref 3.5–5.1)
Sodium: 141 mEq/L (ref 136–145)
TOTAL PROTEIN: 6.3 g/dL — AB (ref 6.4–8.3)
Total Bilirubin: 0.33 mg/dL (ref 0.20–1.20)

## 2014-03-18 MED ORDER — DEXAMETHASONE SODIUM PHOSPHATE 20 MG/5ML IJ SOLN
20.0000 mg | Freq: Once | INTRAMUSCULAR | Status: AC
  Administered 2014-03-18: 20 mg via INTRAVENOUS

## 2014-03-18 MED ORDER — SODIUM CHLORIDE 0.9 % IV SOLN
800.0000 mg/m2 | Freq: Once | INTRAVENOUS | Status: AC
  Administered 2014-03-18: 1482 mg via INTRAVENOUS
  Filled 2014-03-18: qty 38.98

## 2014-03-18 MED ORDER — ONDANSETRON 16 MG/50ML IVPB (CHCC)
16.0000 mg | Freq: Once | INTRAVENOUS | Status: AC
  Administered 2014-03-18: 16 mg via INTRAVENOUS

## 2014-03-18 MED ORDER — ONDANSETRON 16 MG/50ML IVPB (CHCC)
INTRAVENOUS | Status: AC
  Filled 2014-03-18: qty 16

## 2014-03-18 MED ORDER — DIPHENHYDRAMINE HCL 25 MG PO CAPS
ORAL_CAPSULE | ORAL | Status: AC
  Filled 2014-03-18: qty 2

## 2014-03-18 MED ORDER — SODIUM CHLORIDE 0.9 % IV SOLN
420.0000 mg | Freq: Once | INTRAVENOUS | Status: AC
  Administered 2014-03-18: 420 mg via INTRAVENOUS
  Filled 2014-03-18: qty 14

## 2014-03-18 MED ORDER — SODIUM CHLORIDE 0.9 % IV SOLN
700.0000 mg | Freq: Once | INTRAVENOUS | Status: AC
  Administered 2014-03-18: 700 mg via INTRAVENOUS
  Filled 2014-03-18: qty 70

## 2014-03-18 MED ORDER — ACETAMINOPHEN 325 MG PO TABS
ORAL_TABLET | ORAL | Status: AC
  Filled 2014-03-18: qty 2

## 2014-03-18 MED ORDER — DEXAMETHASONE SODIUM PHOSPHATE 20 MG/5ML IJ SOLN
INTRAMUSCULAR | Status: AC
  Filled 2014-03-18: qty 5

## 2014-03-18 MED ORDER — ACETAMINOPHEN 325 MG PO TABS
650.0000 mg | ORAL_TABLET | Freq: Once | ORAL | Status: AC
  Administered 2014-03-18: 650 mg via ORAL

## 2014-03-18 MED ORDER — SODIUM CHLORIDE 0.9 % IJ SOLN
10.0000 mL | INTRAMUSCULAR | Status: DC | PRN
  Administered 2014-03-18: 10 mL
  Filled 2014-03-18: qty 10

## 2014-03-18 MED ORDER — SODIUM CHLORIDE 0.9 % IV SOLN
Freq: Once | INTRAVENOUS | Status: AC
  Administered 2014-03-18: 10:00:00 via INTRAVENOUS

## 2014-03-18 MED ORDER — DIPHENHYDRAMINE HCL 25 MG PO CAPS
50.0000 mg | ORAL_CAPSULE | Freq: Once | ORAL | Status: AC
  Administered 2014-03-18: 50 mg via ORAL

## 2014-03-18 MED ORDER — SODIUM CHLORIDE 0.9 % IV SOLN
6.0000 mg/kg | Freq: Once | INTRAVENOUS | Status: AC
  Administered 2014-03-18: 462 mg via INTRAVENOUS
  Filled 2014-03-18: qty 22

## 2014-03-18 MED ORDER — HEPARIN SOD (PORK) LOCK FLUSH 100 UNIT/ML IV SOLN
500.0000 [IU] | Freq: Once | INTRAVENOUS | Status: AC | PRN
  Administered 2014-03-18: 500 [IU]
  Filled 2014-03-18: qty 5

## 2014-03-18 NOTE — Telephone Encounter (Signed)
Per staff message and POF I have scheduled appts. Advised scheduler of appts. JMW  

## 2014-03-18 NOTE — Progress Notes (Signed)
Patient Care Team: Darreld Mclean, MD as PCP - General (Family Medicine) Rulon Eisenmenger, MD as Consulting Physician (Hematology and Oncology) [name], MD as Consulting Physician (General Surgery) Thea Silversmith, MD as Consulting Physician (Radiation Oncology)  DIAGNOSIS: Breast cancer of upper-outer quadrant of right female breast   Primary site: Breast (Right)   Staging method: AJCC 7th Edition   Clinical: Stage IIA (T2, N0, cM0)   Summary: Stage IIA (T2, N0, cM0)   Clinical comments: Staged at breast conference 12/29/13.   SUMMARY OF ONCOLOGIC HISTORY:   Breast cancer of upper-outer quadrant of right female breast   12/16/2013 Mammogram 1. A palpable 4.1 cm diameter mass/asymmetry with associated coarse heterogeneous microcalcifications at 11 o'clock 2 cm from the right nipple demonstrates mammographic and sonographic features highly suggestive of malignancy.    12/16/2013 Initial Biopsy Breast cancer of upper-outer quadrant of right female breast; Invasive Ductal Cancer Er 100%, PR: 0%; Her2 Positive Ratio 4.28: Ki 67:24%;    12/23/2013 Breast MRI Right Breast 4.4 x 3 x 2.5 cm and a sub cm satellite lesion. No LN   01/28/2014 -  Neo-Adjuvant Chemotherapy Taxotere, carboplatin, Herceptin, Perjeta every 3 weeks , after 2 cycles patient developed hand exfoliating rash related to Taxotere. Chemotherapy changed to gemcitabine, carboplatin, Herceptin, Perjeta from 03/18/2014    CHIEF COMPLIANT: Patient is here for cycle 3 chemotherapy  INTERVAL HISTORY: IYONNA Crawford is a 58 year old Caucasian with above-mentioned history of HER-2 positive breast cancer currently on neoadjuvant chemotherapy. She is here today for cycle 3 of treatment. She developed profound hand-foot syndrome after 2 cycles. We changed her treatment plan to gemcitabine and carboplatin Herceptin and Perjeta. Her hand-foot syndrome is much improved.  REVIEW OF SYSTEMS:   Constitutional: Denies fevers, chills or abnormal  weight loss Eyes: Denies blurriness of vision Ears, nose, mouth, throat, and face: Denies mucositis or sore throat Respiratory: Denies cough, dyspnea or wheezes Cardiovascular: Denies palpitation, chest discomfort or lower extremity swelling Gastrointestinal:  Denies nausea, heartburn or change in bowel habits Skin: Denies abnormal skin rashes Lymphatics: Denies new lymphadenopathy or easy bruising Neurological:Denies numbness, tingling or new weaknesses Behavioral/Psych: Mood is stable, no new changes  Breast: Improvement in the size of the breast mass. All other systems were reviewed with the patient and are negative.  I have reviewed the past medical history, past surgical history, social history and family history with the patient and they are unchanged from previous note.  ALLERGIES:  is allergic to codeine.  MEDICATIONS:  Current Outpatient Prescriptions  Medication Sig Dispense Refill  . ALPRAZolam (XANAX) 0.5 MG tablet Take 1 tablet (0.5 mg total) by mouth 3 (three) times daily as needed for anxiety (anxiety).  90 tablet  0  . Alum & Mag Hydroxide-Simeth (MAGIC MOUTHWASH W/LIDOCAINE) SOLN Take 5 mLs by mouth 4 (four) times daily as needed for mouth pain (duke's formula with nystatin and lidocaine; all in 1:1 combo please.).  240 mL  1  . atenolol (TENORMIN) 50 MG tablet Take 50 mg by mouth 2 (two) times daily.      Marland Kitchen doxycycline (VIBRA-TABS) 100 MG tablet Take 1 tablet (100 mg total) by mouth 2 (two) times daily.  20 tablet  0  . fluconazole (DIFLUCAN) 100 MG tablet Take 1 tablet (100 mg total) by mouth daily.  7 tablet  0  . lidocaine-prilocaine (EMLA) cream Apply 1 application topically as needed (port a cath site). Apply to [facility] a cath site 2 hours prior to chemotherapy appointment      .  LORazepam (ATIVAN) 0.5 MG tablet Take 2 tablets (1 mg total) by mouth at bedtime.  30 tablet  0  . naproxen sodium (ANAPROX) 220 MG tablet Take 440-660 mg by mouth 2 (two) times daily.       Marland Kitchen  nystatin (MYCOSTATIN/NYSTOP) 100000 UNIT/GM POWD Apply to the affected area daily until resolved.  60 g  1  . omeprazole (PRILOSEC) 20 MG capsule Take 20 mg by mouth every evening.       . ondansetron (ZOFRAN) 8 MG tablet Take 1 tablet (8 mg total) by mouth every 8 (eight) hours as needed for nausea or vomiting (nausea).  30 tablet  1  . oxyCODONE-acetaminophen (ROXICET) 5-325 MG per tablet Take 1 tablet by mouth every 4 (four) hours as needed for severe pain (pain).  30 tablet  0  . prochlorperazine (COMPAZINE) 10 MG tablet Take 1 tablet (10 mg total) by mouth every 6 (six) hours as needed (Nausea or vomiting).  30 tablet  1  . sertraline (ZOLOFT) 50 MG tablet Take 1 tablet (50 mg total) by mouth at bedtime.  90 tablet  3  . triamcinolone cream (KENALOG) 0.1 % Apply 1 application topically 3 (three) times daily.  30 g  0   No current facility-administered medications for this visit.    PHYSICAL EXAMINATION: ECOG PERFORMANCE STATUS: 1 - Symptomatic but completely ambulatory  Filed Vitals:   03/18/14 0850  BP: 134/72  Pulse: 68  Temp: 98.4 F (36.9 C)  Resp: 18   Filed Weights   03/18/14 0850  Weight: 166 lb 1.6 oz (75.342 kg)    GENERAL:alert, no distress and comfortable SKIN: skin color, texture, turgor are normal, no rashes or significant lesions EYES: normal, Conjunctiva are pink and non-injected, sclera clear OROPHARYNX:no exudate, no erythema and lips, buccal mucosa, and tongue normal  NECK: supple, thyroid normal size, non-tender, without nodularity LYMPH:  no palpable lymphadenopathy in the cervical, axillary or inguinal LUNGS: clear to auscultation and percussion with normal breathing effort HEART: regular rate & rhythm and no murmurs and no lower extremity edema ABDOMEN:abdomen soft, non-tender and normal bowel sounds Musculoskeletal:no cyanosis of digits and no clubbing  NEURO: alert & oriented x 3 with fluent speech, no focal motor/sensory deficits  LABORATORY DATA:   I have reviewed the data as listed   Chemistry      Component Value Date/Time   NA 141 03/18/2014 0806   NA 130* 02/26/2014 1238   K 4.0 03/18/2014 0806   K 4.0 02/26/2014 1238   CL 96 02/26/2014 1238   CO2 24 03/18/2014 0806   CO2 20 02/26/2014 1238   BUN 4.9* 03/18/2014 0806   BUN 7 02/26/2014 1238   CREATININE 0.8 03/18/2014 0806   CREATININE 0.59 02/26/2014 1238   CREATININE 0.76 04/03/2013 1420      Component Value Date/Time   CALCIUM 8.8 03/18/2014 0806   CALCIUM 8.6 02/26/2014 1238   ALKPHOS 140 03/18/2014 0806   ALKPHOS 151* 02/26/2014 1238   AST 23 03/18/2014 0806   AST 25 02/26/2014 1238   ALT 23 03/18/2014 0806   ALT 38* 02/26/2014 1238   BILITOT 0.33 03/18/2014 0806   BILITOT 0.7 02/26/2014 1238       Lab Results  Component Value Date   WBC 10.4* 03/18/2014   HGB 12.6 03/18/2014   HCT 37.9 03/18/2014   MCV 91.1 03/18/2014   PLT 378 03/18/2014   NEUTROABS 7.4* 03/18/2014    ASSESSMENT & PLAN:  Breast cancer of upper-outer  quadrant of right female breast 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  Chemotherapy toxicity: Patient developed severe hand-foot syndrome related to Taxotere. New chemotherapy plan: Gemcitabine, carboplatin, trastuzumab and Pertuzumab from today which is cycle 3. Plan is to continue and complete 6 cycles of chemotherapy  Clinical response: Breast exam continues to show improvement in the tumor support plan is to continue and finish 06 cycles of chemotherapy the MRI of the breast. Monitoring very closely for chemotherapy toxicities. I reviewed the blood work and she will be receiving chemotherapy today.  Palmar plantar erythrodysaesthesia Related to Taxotere. He denies any other taxane since it would cause the same  reaction. Hence the chemotherapy has been changed to gemcitabine instead of Taxotere. She was prescribed oral doxycycline and topical triamcinolone. Her rash has improved on the treatment.  No orders of the defined types were placed in this encounter.   The patient has a good understanding of the overall plan. she agrees with it. She will call with any problems that may develop before her next visit here.  I spent 20 minutes counseling the patient face to face. The total time spent in the appointment was 25 minutes and more than 50% was on counseling and review of test results    Rulon Eisenmenger, MD 03/18/2014 8:53 AM

## 2014-03-18 NOTE — Telephone Encounter (Signed)
Gave avs & cal for Nov. Sent mess to sch tx. °

## 2014-03-18 NOTE — Assessment & Plan Note (Signed)
Related to Taxotere. He denies any other taxane since it would cause the same reaction. Hence the chemotherapy has been changed to gemcitabine instead of Taxotere. She was prescribed oral doxycycline and topical triamcinolone. Her rash has improved on the treatment.

## 2014-03-18 NOTE — Patient Instructions (Signed)
[place] Discharge Instructions for Patients Receiving Chemotherapy  Today you received the following chemotherapy agents: Herceptin, Perjeta, Gemzar, Carboplatin.  To help prevent nausea and vomiting after your treatment, we encourage you to take your nausea medication as prescribed.   If you develop nausea and vomiting that is not controlled by your nausea medication, call the clinic.   BELOW ARE SYMPTOMS THAT SHOULD BE REPORTED IMMEDIATELY:  *FEVER GREATER THAN 100.5 F  *CHILLS WITH OR WITHOUT FEVER  NAUSEA AND VOMITING THAT IS NOT CONTROLLED WITH YOUR NAUSEA MEDICATION  *UNUSUAL SHORTNESS OF BREATH  *UNUSUAL BRUISING OR BLEEDING  TENDERNESS IN MOUTH AND THROAT WITH OR WITHOUT PRESENCE OF ULCERS  *URINARY PROBLEMS  *BOWEL PROBLEMS  UNUSUAL RASH Items with * indicate a potential emergency and should be followed up as soon as possible.  Feel free to call the clinic you have any questions or concerns. The clinic phone number is [phone].

## 2014-03-18 NOTE — Assessment & Plan Note (Addendum)
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  Chemotherapy toxicity: Patient developed severe hand-foot syndrome related to Taxotere. New chemotherapy plan: Gemcitabine, carboplatin, trastuzumab and Pertuzumab from today which is cycle 3. Patient will receive gemcitabine only on day 1. Plan is to continue and complete 6 cycles of chemotherapy  Clinical response: Breast exam continues to show improvement in the tumor support plan is to continue and finish 06 cycles of chemotherapy the MRI of the breast. Monitoring very closely for chemotherapy toxicities. I reviewed the blood work and she will be receiving chemotherapy today.

## 2014-03-19 ENCOUNTER — Ambulatory Visit (HOSPITAL_BASED_OUTPATIENT_CLINIC_OR_DEPARTMENT_OTHER): Payer: Medicaid Other

## 2014-03-19 VITALS — BP 126/86 | HR 78 | Temp 97.9°F | Resp 18

## 2014-03-19 DIAGNOSIS — C50411 Malignant neoplasm of upper-outer quadrant of right female breast: Secondary | ICD-10-CM

## 2014-03-19 DIAGNOSIS — C50811 Malignant neoplasm of overlapping sites of right female breast: Secondary | ICD-10-CM

## 2014-03-19 MED ORDER — PEGFILGRASTIM INJECTION 6 MG/0.6ML
6.0000 mg | Freq: Once | SUBCUTANEOUS | Status: AC
  Administered 2014-03-19: 6 mg via SUBCUTANEOUS

## 2014-03-21 ENCOUNTER — Other Ambulatory Visit: Payer: Self-pay | Admitting: *Deleted

## 2014-03-21 ENCOUNTER — Other Ambulatory Visit: Payer: Self-pay | Admitting: Hematology and Oncology

## 2014-03-21 ENCOUNTER — Encounter: Payer: Self-pay | Admitting: Nurse Practitioner

## 2014-03-21 DIAGNOSIS — C50411 Malignant neoplasm of upper-outer quadrant of right female breast: Secondary | ICD-10-CM

## 2014-03-21 MED ORDER — OXYCODONE-ACETAMINOPHEN 5-325 MG PO TABS: 1.0000 | ORAL_TABLET | ORAL | Status: DC | PRN

## 2014-03-21 NOTE — Telephone Encounter (Signed)
CHEMO FOLLOW UP CALL PT. IS FORCING FLUIDS AND EATING SMALL MEALS. SHE HAS HAD SOME NAUSEA WHICH THE ANTIEMETICS HAVE HELPED. NO VOMITING. PT. HAS HAD DIARRHEA X2 WHICH IMODIUM HAS HELPED. NO MOUTH ISSUES. SHE HAD A HEADACHE WHICH CLARITIN RELIEVED. PT.COMPLAINING OF SEVERE PAIN ALL OVER HER BODY. SHE HAS TAKING ALL OF HER PAIN MEDICATION. SEE MEDICATION LIST. PT. TO COME PICK UP A NEW PRESCRIPTION.

## 2014-03-23 NOTE — Progress Notes (Signed)
No answer - attempted to returned pt call re: schedule.  POF sent for Cycle 2, 3 and 4.   Appt 11/20 cancelled.  Per Dr Lindi Adie, adjust herceptin/perjeta dates to gemzar/carbo dates.  Note given to pharmacy.

## 2014-03-24 ENCOUNTER — Telehealth: Payer: Self-pay | Admitting: Hematology and Oncology

## 2014-03-24 ENCOUNTER — Telehealth: Payer: Self-pay | Admitting: *Deleted

## 2014-03-24 NOTE — Telephone Encounter (Signed)
Labs/ov/injection per 11/04 POF, sent msg to add chemo and pt is to p/u sch at next visit 11/09.... KJ

## 2014-03-24 NOTE — Telephone Encounter (Signed)
Per staff message and POF I have scheduled appts. Advised scheduler of appts. JMW  

## 2014-03-28 ENCOUNTER — Ambulatory Visit (HOSPITAL_BASED_OUTPATIENT_CLINIC_OR_DEPARTMENT_OTHER): Payer: Self-pay | Admitting: Adult Health

## 2014-03-28 ENCOUNTER — Ambulatory Visit (HOSPITAL_BASED_OUTPATIENT_CLINIC_OR_DEPARTMENT_OTHER): Payer: Self-pay

## 2014-03-28 ENCOUNTER — Other Ambulatory Visit (HOSPITAL_BASED_OUTPATIENT_CLINIC_OR_DEPARTMENT_OTHER): Payer: Self-pay

## 2014-03-28 ENCOUNTER — Other Ambulatory Visit: Payer: Self-pay

## 2014-03-28 VITALS — BP 104/61 | HR 60 | Temp 98.5°F | Resp 18 | Ht 64.0 in | Wt 162.6 lb

## 2014-03-28 DIAGNOSIS — E860 Dehydration: Secondary | ICD-10-CM

## 2014-03-28 DIAGNOSIS — C50411 Malignant neoplasm of upper-outer quadrant of right female breast: Secondary | ICD-10-CM

## 2014-03-28 DIAGNOSIS — K521 Toxic gastroenteritis and colitis: Secondary | ICD-10-CM

## 2014-03-28 DIAGNOSIS — T451X5A Adverse effect of antineoplastic and immunosuppressive drugs, initial encounter: Secondary | ICD-10-CM

## 2014-03-28 DIAGNOSIS — R112 Nausea with vomiting, unspecified: Secondary | ICD-10-CM | POA: Insufficient documentation

## 2014-03-28 DIAGNOSIS — C50811 Malignant neoplasm of overlapping sites of right female breast: Secondary | ICD-10-CM

## 2014-03-28 DIAGNOSIS — Z170 Estrogen receptor positive status [ER+]: Secondary | ICD-10-CM

## 2014-03-28 LAB — COMPREHENSIVE METABOLIC PANEL (CC13)
ALT: 21 U/L (ref 0–55)
AST: 24 U/L (ref 5–34)
Albumin: 3.7 g/dL (ref 3.5–5.0)
Alkaline Phosphatase: 162 U/L — ABNORMAL HIGH (ref 40–150)
Anion Gap: 10 mEq/L (ref 3–11)
BILIRUBIN TOTAL: 0.37 mg/dL (ref 0.20–1.20)
BUN: 12.4 mg/dL (ref 7.0–26.0)
CALCIUM: 8.5 mg/dL (ref 8.4–10.4)
CHLORIDE: 95 meq/L — AB (ref 98–109)
CO2: 26 meq/L (ref 22–29)
Creatinine: 1.8 mg/dL — ABNORMAL HIGH (ref 0.6–1.1)
Glucose: 101 mg/dl (ref 70–140)
Potassium: 3.3 mEq/L — ABNORMAL LOW (ref 3.5–5.1)
Sodium: 132 mEq/L — ABNORMAL LOW (ref 136–145)
Total Protein: 6.6 g/dL (ref 6.4–8.3)

## 2014-03-28 LAB — CBC WITH DIFFERENTIAL/PLATELET
BASO%: 0.6 % (ref 0.0–2.0)
Basophils Absolute: 0.1 10*3/uL (ref 0.0–0.1)
EOS%: 0.7 % (ref 0.0–7.0)
Eosinophils Absolute: 0.1 10*3/uL (ref 0.0–0.5)
HCT: 33.9 % — ABNORMAL LOW (ref 34.8–46.6)
HGB: 11.3 g/dL — ABNORMAL LOW (ref 11.6–15.9)
LYMPH#: 3.1 10*3/uL (ref 0.9–3.3)
LYMPH%: 14.6 % (ref 14.0–49.7)
MCH: 29.9 pg (ref 25.1–34.0)
MCHC: 33.3 g/dL (ref 31.5–36.0)
MCV: 89.8 fL (ref 79.5–101.0)
MONO#: 1.6 10*3/uL — AB (ref 0.1–0.9)
MONO%: 7.4 % (ref 0.0–14.0)
NEUT#: 16.3 10*3/uL — ABNORMAL HIGH (ref 1.5–6.5)
NEUT%: 76.7 % (ref 38.4–76.8)
Platelets: 122 10*3/uL — ABNORMAL LOW (ref 145–400)
RBC: 3.77 10*6/uL (ref 3.70–5.45)
RDW: 14.9 % — ABNORMAL HIGH (ref 11.2–14.5)
WBC: 21.3 10*3/uL — AB (ref 3.9–10.3)

## 2014-03-28 MED ORDER — SODIUM CHLORIDE 0.9 % IV SOLN
1000.0000 mL | Freq: Once | INTRAVENOUS | Status: AC
  Administered 2014-03-28: 12:00:00 via INTRAVENOUS

## 2014-03-28 MED ORDER — HEPARIN SOD (PORK) LOCK FLUSH 100 UNIT/ML IV SOLN
500.0000 [IU] | Freq: Once | INTRAVENOUS | Status: AC | PRN
  Administered 2014-03-28: 500 [IU]
  Filled 2014-03-28: qty 5

## 2014-03-28 MED ORDER — ONDANSETRON 8 MG/NS 50 ML IVPB
INTRAVENOUS | Status: AC
  Filled 2014-03-28: qty 8

## 2014-03-28 MED ORDER — OXYCODONE-ACETAMINOPHEN 5-325 MG PO TABS: 1.0000 | ORAL_TABLET | ORAL | Status: DC | PRN

## 2014-03-28 MED ORDER — SODIUM CHLORIDE 0.9 % IJ SOLN
10.0000 mL | INTRAMUSCULAR | Status: DC | PRN
  Administered 2014-03-28: 10 mL
  Filled 2014-03-28: qty 10

## 2014-03-28 MED ORDER — ONDANSETRON 8 MG/50ML IVPB (CHCC)
8.0000 mg | Freq: Once | INTRAVENOUS | Status: AC
  Administered 2014-03-28: 8 mg via INTRAVENOUS

## 2014-03-28 NOTE — Assessment & Plan Note (Signed)
Patient's creatinine went up to 1.8. Her urine output has decreased. We will give her IV fluids 500 mL of normal saline today. Patient is mildly hypokalemic. I encouraged her to take potassium containing foods.

## 2014-03-28 NOTE — Progress Notes (Signed)
Patient Care Team: Darreld Mclean, MD as PCP - General (Family Medicine) Rulon Eisenmenger, MD as Consulting Physician (Hematology and Oncology) [name], MD as Consulting Physician (General Surgery) Thea Silversmith, MD as Consulting Physician (Radiation Oncology)  DIAGNOSIS: Breast cancer of upper-outer quadrant of right female breast   Staging form: Breast, AJCC 7th Edition     Clinical: Stage IIA (T2, N0, cM0) - Unsigned       Staging comments: Staged at breast conference 12/29/13.      Pathologic: No stage assigned - Unsigned   SUMMARY OF ONCOLOGIC HISTORY:   Breast cancer of upper-outer quadrant of right female breast   12/16/2013 Mammogram 1. A palpable 4.1 cm diameter mass/asymmetry with associated coarse heterogeneous microcalcifications at 11 o'clock 2 cm from the right nipple demonstrates mammographic and sonographic features highly suggestive of malignancy.    12/16/2013 Initial Biopsy Breast cancer of upper-outer quadrant of right female breast; Invasive Ductal Cancer Er 100%, PR: 0%; Her2 Positive Ratio 4.28: Ki 67:24%;    12/23/2013 Breast MRI Right Breast 4.4 x 3 x 2.5 cm and a sub cm satellite lesion. No LN   01/28/2014 -  Neo-Adjuvant Chemotherapy Taxotere, carboplatin, Herceptin, Perjeta every 3 weeks , after 2 cycles patient developed hand exfoliating rash related to Taxotere. Chemotherapy changed to gemcitabine, carboplatin, Herceptin, Perjeta from 03/18/2014   Today is cycle 3 day 8 CHIEF COMPLIANT: Profound fatigue, nausea related to chemotherapy, improvement of hand foot syndrome  INTERVAL HISTORY: [name] is a 58 year old Caucasian with above-mentioned history of breast cancer currently on neoadjuvant chemotherapy. Her chemotherapy was changed from Taxotere, carboplatin, Herceptin and Perjeta after 2 cycles to gemcitabine, carboplatin, Herceptin and Perjeta due to hand-foot syndrome. She is here one week after that change in treatment plan. She reports profound  fatigue to the point of total exhaustion. She is also complaining of nausea related to chemotherapy. She has not been eating or drinking much. She occasionally gets diarrhea. Lately she's not had any diarrhea other than one episode of loose stools.  REVIEW OF SYSTEMS:   Constitutional: Denies fevers, chills or abnormal weight loss; complains of fatigue and chemotherapy induced nausea vomiting Eyes: Denies blurriness of vision Ears, nose, mouth, throat, and face: Denies mucositis or sore throat Respiratory: Denies cough, dyspnea or wheezes Cardiovascular: Denies palpitation, chest discomfort or lower extremity swelling Gastrointestinal:  nausea, heartburn or change in bowel habits Skin: Denies abnormal skin rashes Lymphatics: Denies new lymphadenopathy or easy bruising Neurological:Denies numbness, tingling or new weaknesses Behavioral/Psych: Mood is stable, no new changes  Breast:  denies any pain or lumps or nodules in either breasts All other systems were reviewed with the patient and are negative.  I have reviewed the past medical history, past surgical history, social history and family history with the patient and they are unchanged from previous note.  ALLERGIES:  is allergic to codeine.  MEDICATIONS:  Current Outpatient Prescriptions  Medication Sig Dispense Refill  . ALPRAZolam (XANAX) 0.5 MG tablet Take 1 tablet (0.5 mg total) by mouth 3 (three) times daily as needed for anxiety (anxiety). 90 tablet 0  . atenolol (TENORMIN) 50 MG tablet Take 50 mg by mouth 2 (two) times daily.    . naproxen sodium (ANAPROX) 220 MG tablet Take 440-660 mg by mouth 2 (two) times daily.     Marland Kitchen omeprazole (PRILOSEC) 20 MG capsule Take 20 mg by mouth every evening.     . ondansetron (ZOFRAN) 8 MG tablet Take 1 tablet (8 mg total)  by mouth every 8 (eight) hours as needed for nausea or vomiting (nausea). 30 tablet 1  . oxyCODONE-acetaminophen (ROXICET) 5-325 MG per tablet Take 1 tablet by mouth every 4  (four) hours as needed for severe pain (pain). 40 tablet 0  . prochlorperazine (COMPAZINE) 10 MG tablet Take 1 tablet (10 mg total) by mouth every 6 (six) hours as needed (Nausea or vomiting). 30 tablet 1  . sertraline (ZOLOFT) 50 MG tablet Take 1 tablet (50 mg total) by mouth at bedtime. 90 tablet 3  . Alum & Mag Hydroxide-Simeth (MAGIC MOUTHWASH W/LIDOCAINE) SOLN Take 5 mLs by mouth 4 (four) times daily as needed for mouth pain (duke's formula with nystatin and lidocaine; all in 1:1 combo please.). 240 mL 1  . fluconazole (DIFLUCAN) 100 MG tablet Take 1 tablet (100 mg total) by mouth daily. 7 tablet 0  . lidocaine-prilocaine (EMLA) cream Apply 1 application topically as needed (port a cath site). Apply to [facility] a cath site 2 hours prior to chemotherapy appointment    . LORazepam (ATIVAN) 0.5 MG tablet Take 2 tablets (1 mg total) by mouth at bedtime. 30 tablet 0  . nystatin (MYCOSTATIN/NYSTOP) 100000 UNIT/GM POWD Apply to the affected area daily until resolved. 60 g 1  . triamcinolone cream (KENALOG) 0.1 % Apply 1 application topically 3 (three) times daily. 30 g 0   No current facility-administered medications for this visit.    PHYSICAL EXAMINATION: ECOG PERFORMANCE STATUS: 2 - Symptomatic, <50% confined to bed  Filed Vitals:   03/28/14 0855  BP: 104/61  Pulse: 60  Temp: 98.5 F (36.9 C)  Resp: 18   Filed Weights   03/28/14 0855  Weight: 162 lb 9.6 oz (73.755 kg)    GENERAL:alert, no distress and comfortable SKIN: dry skin and signs of dehydration EYES: normal, Conjunctiva are pink and non-injected, sclera clear OROPHARYNX:no exudate, no erythema and lips, buccal mucosa, and tongue normal  NECK: supple, thyroid normal size, non-tender, without nodularity LYMPH:  no palpable lymphadenopathy in the cervical, axillary or inguinal LUNGS: clear to auscultation and percussion with normal breathing effort HEART: regular rate & rhythm and no murmurs and no lower extremity  edema ABDOMEN:abdomen soft, non-tender and normal bowel sounds Musculoskeletal:no cyanosis of digits and no clubbing  NEURO: alert & oriented x 3 with fluent speech, no focal motor/sensory deficits  LABORATORY DATA:  I have reviewed the data as listed   Chemistry      Component Value Date/Time   NA 132* 03/28/2014 0828   NA 130* 02/26/2014 1238   K 3.3* 03/28/2014 0828   K 4.0 02/26/2014 1238   CL 96 02/26/2014 1238   CO2 26 03/28/2014 0828   CO2 20 02/26/2014 1238   BUN 12.4 03/28/2014 0828   BUN 7 02/26/2014 1238   CREATININE 1.8* 03/28/2014 0828   CREATININE 0.59 02/26/2014 1238   CREATININE 0.76 04/03/2013 1420      Component Value Date/Time   CALCIUM 8.5 03/28/2014 0828   CALCIUM 8.6 02/26/2014 1238   ALKPHOS 162* 03/28/2014 0828   ALKPHOS 151* 02/26/2014 1238   AST 24 03/28/2014 0828   AST 25 02/26/2014 1238   ALT 21 03/28/2014 0828   ALT 38* 02/26/2014 1238   BILITOT 0.37 03/28/2014 0828   BILITOT 0.7 02/26/2014 1238       Lab Results  Component Value Date   WBC 21.3* 03/28/2014   HGB 11.3* 03/28/2014   HCT 33.9* 03/28/2014   MCV 89.8 03/28/2014   PLT 122* 03/28/2014  NEUTROABS 16.3* 03/28/2014     RADIOGRAPHIC STUDIES: I have personally reviewed the radiology reports and agreed with their findings. No results found.   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  His therapy was changed from [facility] to [place] gemcitabine and Herceptin and Perjeta with cycle 3 due to severe hand-foot syndrome related to Taxotere. Unfortunately she is not able to tolerate chemotherapy. I would like to discontinue gemcitabine from cycle 4 I believe her on carboplatin Herceptin and  Perjeta.  Return to clinic in 2 weeks for cycle 4 of chemotherapy which would be with carboplatin, Herceptin, Perjeta.  Dehydration Patient's creatinine went up to 1.8. Her urine output has decreased. We will give her IV fluids 500 mL of normal saline today. Patient is mildly hypokalemic. I encouraged her to take potassium containing foods.   No orders of the defined types were placed in this encounter.   The patient has a good understanding of the overall plan. she agrees with it. She will call with any problems that may develop before her next visit here.  I spent 25 minutes counseling the patient face to face. The total time spent in the appointment was 30 minutes and more than 50% was on counseling and review of test results    Rulon Eisenmenger, MD 03/28/2014 10:43 AM

## 2014-03-28 NOTE — Assessment & Plan Note (Signed)
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  His therapy was changed from [facility] to [place] gemcitabine and Herceptin and Perjeta with cycle 3 due to severe hand-foot syndrome related to Taxotere. Unfortunately she is not able to tolerate chemotherapy. I would like to discontinue gemcitabine from cycle 4 I believe her on carboplatin Herceptin and Perjeta.  Return to clinic in 2 weeks for cycle 4 of chemotherapy which would be with carboplatin, Herceptin, Perjeta.

## 2014-03-28 NOTE — Patient Instructions (Signed)
Dehydration, Adult Dehydration is when you lose more fluids from the body than you take in. Vital organs like the kidneys, brain, and heart cannot function without a proper amount of fluids and salt. Any loss of fluids from the body can cause dehydration.  CAUSES   Vomiting.  Diarrhea.  Excessive sweating.  Excessive urine output.  Fever. SYMPTOMS  Mild dehydration  Thirst.  Dry lips.  Slightly dry mouth. Moderate dehydration  Very dry mouth.  Sunken eyes.  Skin does not bounce back quickly when lightly pinched and released.  Dark urine and decreased urine production.  Decreased tear production.  Headache. Severe dehydration  Very dry mouth.  Extreme thirst.  Rapid, weak pulse (more than 100 beats per minute at rest).  Cold hands and feet.  Not able to sweat in spite of heat and temperature.  Rapid breathing.  Blue lips.  Confusion and lethargy.  Difficulty being awakened.  Minimal urine production.  No tears. DIAGNOSIS  Your caregiver will diagnose dehydration based on your symptoms and your exam. Blood and urine tests will help confirm the diagnosis. The diagnostic evaluation should also identify the cause of dehydration. TREATMENT  Treatment of mild or moderate dehydration can often be done at home by increasing the amount of fluids that you drink. It is best to drink small amounts of fluid more often. Drinking too much at one time can make vomiting worse. Refer to the home care instructions below. Severe dehydration needs to be treated at the hospital where you will probably be given intravenous (IV) fluids that contain water and electrolytes. HOME CARE INSTRUCTIONS   Ask your caregiver about specific rehydration instructions.  Drink enough fluids to keep your urine clear or pale yellow.  Drink small amounts frequently if you have nausea and vomiting.  Eat as you normally do.  Avoid:  Foods or drinks high in sugar.  Carbonated  drinks.  Juice.  Extremely hot or cold fluids.  Drinks with caffeine.  Fatty, greasy foods.  Alcohol.  Tobacco.  Overeating.  Gelatin desserts.  Wash your hands well to avoid spreading bacteria and viruses.  Only take over-the-counter or prescription medicines for pain, discomfort, or fever as directed by your caregiver.  Ask your caregiver if you should continue all prescribed and over-the-counter medicines.  Keep all follow-up appointments with your caregiver. SEEK MEDICAL CARE IF:  You have abdominal pain and it increases or stays in one area (localizes).  You have a rash, stiff neck, or severe headache.  You are irritable, sleepy, or difficult to awaken.  You are weak, dizzy, or extremely thirsty. SEEK IMMEDIATE MEDICAL CARE IF:   You are unable to keep fluids down or you get worse despite treatment.  You have frequent episodes of vomiting or diarrhea.  You have blood or green matter (bile) in your vomit.  You have blood in your stool or your stool looks black and tarry.  You have not urinated in 6 to 8 hours, or you have only urinated a small amount of very dark urine.  You have a fever.  You faint. MAKE SURE YOU:   Understand these instructions.  Will watch your condition.  Will get help right away if you are not doing well or get worse. Document Released: 05/06/2005 Document Revised: 07/29/2011 Document Reviewed: 12/24/2010 Exit[employer] Patient Information 2015 [employer]. This information is not intended to replace advice given to you by your health care provider. Make sure you discuss any questions you have with your health care   provider.  

## 2014-03-29 ENCOUNTER — Telehealth: Payer: Self-pay | Admitting: *Deleted

## 2014-03-29 ENCOUNTER — Telehealth: Payer: Self-pay | Admitting: Hematology and Oncology

## 2014-03-29 NOTE — Telephone Encounter (Signed)
Per staff message and POF I have scheduled appts. Advised scheduler of appts. JMW  

## 2014-03-29 NOTE — Telephone Encounter (Signed)
, °

## 2014-04-01 ENCOUNTER — Ambulatory Visit: Payer: Medicaid Other | Admitting: Hematology and Oncology

## 2014-04-01 ENCOUNTER — Other Ambulatory Visit: Payer: Medicaid Other

## 2014-04-01 ENCOUNTER — Ambulatory Visit: Payer: Medicaid Other

## 2014-04-02 ENCOUNTER — Ambulatory Visit: Payer: Medicaid Other

## 2014-04-04 ENCOUNTER — Other Ambulatory Visit: Payer: Self-pay | Admitting: Physician Assistant

## 2014-04-04 ENCOUNTER — Other Ambulatory Visit: Payer: Self-pay | Admitting: Hematology and Oncology

## 2014-04-05 ENCOUNTER — Other Ambulatory Visit: Payer: Self-pay

## 2014-04-05 ENCOUNTER — Encounter: Payer: Self-pay | Admitting: Hematology and Oncology

## 2014-04-05 DIAGNOSIS — C50411 Malignant neoplasm of upper-outer quadrant of right female breast: Secondary | ICD-10-CM

## 2014-04-05 MED ORDER — PROCHLORPERAZINE MALEATE 10 MG PO TABS: 10.0000 mg | ORAL_TABLET | Freq: Four times a day (QID) | ORAL | Status: DC | PRN

## 2014-04-05 MED ORDER — ONDANSETRON HCL 8 MG PO TABS: 8.0000 mg | ORAL_TABLET | Freq: Three times a day (TID) | ORAL | Status: DC | PRN

## 2014-04-05 MED ORDER — LORAZEPAM 0.5 MG PO TABS: 1.0000 mg | ORAL_TABLET | Freq: Every day | ORAL | Status: DC

## 2014-04-05 MED ORDER — OXYCODONE-ACETAMINOPHEN 5-325 MG PO TABS: 1.0000 | ORAL_TABLET | ORAL | Status: DC | PRN

## 2014-04-05 NOTE — Telephone Encounter (Signed)
See notes under 02/28/14 ph message. Pt stated then that she was going through chemo and couldn't come in for OV.

## 2014-04-05 NOTE — Progress Notes (Signed)
Per Celso Amy the patient did not meet eligibility requirements for free herceptin and perjeta.

## 2014-04-05 NOTE — Telephone Encounter (Signed)
Clarified - pt needs meds before Friday.  Scrips printed and put in book.  Pt voiced understanding and plans to pick up tomorrow.

## 2014-04-06 NOTE — Telephone Encounter (Signed)
Faxed

## 2014-04-07 ENCOUNTER — Other Ambulatory Visit: Payer: Self-pay

## 2014-04-07 DIAGNOSIS — C50411 Malignant neoplasm of upper-outer quadrant of right female breast: Secondary | ICD-10-CM

## 2014-04-08 ENCOUNTER — Other Ambulatory Visit (HOSPITAL_BASED_OUTPATIENT_CLINIC_OR_DEPARTMENT_OTHER): Payer: Medicaid Other

## 2014-04-08 ENCOUNTER — Ambulatory Visit: Payer: Medicaid Other

## 2014-04-08 ENCOUNTER — Ambulatory Visit (HOSPITAL_BASED_OUTPATIENT_CLINIC_OR_DEPARTMENT_OTHER): Payer: Medicaid Other

## 2014-04-08 ENCOUNTER — Ambulatory Visit (HOSPITAL_BASED_OUTPATIENT_CLINIC_OR_DEPARTMENT_OTHER): Payer: Medicaid Other | Admitting: Hematology and Oncology

## 2014-04-08 ENCOUNTER — Other Ambulatory Visit: Payer: Self-pay | Admitting: Hematology and Oncology

## 2014-04-08 ENCOUNTER — Telehealth: Payer: Self-pay | Admitting: Hematology and Oncology

## 2014-04-08 VITALS — BP 113/67 | HR 66 | Temp 98.3°F | Resp 18 | Ht 64.0 in | Wt 159.5 lb

## 2014-04-08 DIAGNOSIS — Z170 Estrogen receptor positive status [ER+]: Secondary | ICD-10-CM

## 2014-04-08 DIAGNOSIS — C50411 Malignant neoplasm of upper-outer quadrant of right female breast: Secondary | ICD-10-CM

## 2014-04-08 DIAGNOSIS — M79606 Pain in leg, unspecified: Secondary | ICD-10-CM

## 2014-04-08 DIAGNOSIS — D6481 Anemia due to antineoplastic chemotherapy: Secondary | ICD-10-CM

## 2014-04-08 DIAGNOSIS — C50811 Malignant neoplasm of overlapping sites of right female breast: Secondary | ICD-10-CM

## 2014-04-08 DIAGNOSIS — Z5111 Encounter for antineoplastic chemotherapy: Secondary | ICD-10-CM

## 2014-04-08 DIAGNOSIS — Z5112 Encounter for antineoplastic immunotherapy: Secondary | ICD-10-CM

## 2014-04-08 LAB — CBC WITH DIFFERENTIAL/PLATELET
BASO%: 0.2 % (ref 0.0–2.0)
Basophils Absolute: 0 10*3/uL (ref 0.0–0.1)
EOS%: 0.6 % (ref 0.0–7.0)
Eosinophils Absolute: 0.1 10*3/uL (ref 0.0–0.5)
HEMATOCRIT: 34.1 % — AB (ref 34.8–46.6)
HGB: 11.4 g/dL — ABNORMAL LOW (ref 11.6–15.9)
LYMPH#: 2.1 10*3/uL (ref 0.9–3.3)
LYMPH%: 20.1 % (ref 14.0–49.7)
MCH: 30.5 pg (ref 25.1–34.0)
MCHC: 33.4 g/dL (ref 31.5–36.0)
MCV: 91.2 fL (ref 79.5–101.0)
MONO#: 0.9 10*3/uL (ref 0.1–0.9)
MONO%: 8.5 % (ref 0.0–14.0)
NEUT#: 7.3 10*3/uL — ABNORMAL HIGH (ref 1.5–6.5)
NEUT%: 70.6 % (ref 38.4–76.8)
Platelets: 349 10*3/uL (ref 145–400)
RBC: 3.74 10*6/uL (ref 3.70–5.45)
RDW: 16.5 % — ABNORMAL HIGH (ref 11.2–14.5)
WBC: 10.3 10*3/uL (ref 3.9–10.3)

## 2014-04-08 LAB — COMPREHENSIVE METABOLIC PANEL (CC13)
ALT: 11 U/L (ref 0–55)
AST: 19 U/L (ref 5–34)
Albumin: 3.3 g/dL — ABNORMAL LOW (ref 3.5–5.0)
Alkaline Phosphatase: 181 U/L — ABNORMAL HIGH (ref 40–150)
Anion Gap: 12 mEq/L — ABNORMAL HIGH (ref 3–11)
BUN: 3.7 mg/dL — ABNORMAL LOW (ref 7.0–26.0)
CHLORIDE: 104 meq/L (ref 98–109)
CO2: 23 mEq/L (ref 22–29)
CREATININE: 0.8 mg/dL (ref 0.6–1.1)
Calcium: 8.9 mg/dL (ref 8.4–10.4)
Glucose: 111 mg/dl (ref 70–140)
Potassium: 3.7 mEq/L (ref 3.5–5.1)
SODIUM: 139 meq/L (ref 136–145)
TOTAL PROTEIN: 6.6 g/dL (ref 6.4–8.3)
Total Bilirubin: 0.48 mg/dL (ref 0.20–1.20)

## 2014-04-08 MED ORDER — ONDANSETRON 16 MG/50ML IVPB (CHCC)
INTRAVENOUS | Status: AC
  Filled 2014-04-08: qty 16

## 2014-04-08 MED ORDER — GABAPENTIN 100 MG PO CAPS: 100.0000 mg | ORAL_CAPSULE | Freq: Three times a day (TID) | ORAL | Status: DC

## 2014-04-08 MED ORDER — DIPHENHYDRAMINE HCL 25 MG PO CAPS
ORAL_CAPSULE | ORAL | Status: AC
  Filled 2014-04-08: qty 2

## 2014-04-08 MED ORDER — SODIUM CHLORIDE 0.9 % IJ SOLN
10.0000 mL | INTRAMUSCULAR | Status: DC | PRN
  Administered 2014-04-08: 10 mL
  Filled 2014-04-08: qty 10

## 2014-04-08 MED ORDER — DEXAMETHASONE SODIUM PHOSPHATE 20 MG/5ML IJ SOLN
INTRAMUSCULAR | Status: AC
  Filled 2014-04-08: qty 5

## 2014-04-08 MED ORDER — HEPARIN SOD (PORK) LOCK FLUSH 100 UNIT/ML IV SOLN
500.0000 [IU] | Freq: Once | INTRAVENOUS | Status: AC | PRN
  Administered 2014-04-08: 500 [IU]
  Filled 2014-04-08: qty 5

## 2014-04-08 MED ORDER — DIPHENHYDRAMINE HCL 25 MG PO CAPS
50.0000 mg | ORAL_CAPSULE | Freq: Once | ORAL | Status: AC
  Administered 2014-04-08: 50 mg via ORAL

## 2014-04-08 MED ORDER — DEXAMETHASONE SODIUM PHOSPHATE 20 MG/5ML IJ SOLN
20.0000 mg | Freq: Once | INTRAMUSCULAR | Status: AC
  Administered 2014-04-08: 20 mg via INTRAVENOUS

## 2014-04-08 MED ORDER — CARBOPLATIN CHEMO INJECTION 600 MG/60ML
700.0000 mg | Freq: Once | INTRAVENOUS | Status: AC
  Administered 2014-04-08: 700 mg via INTRAVENOUS
  Filled 2014-04-08: qty 70

## 2014-04-08 MED ORDER — SODIUM CHLORIDE 0.9 % IV SOLN
420.0000 mg | Freq: Once | INTRAVENOUS | Status: AC
  Administered 2014-04-08: 420 mg via INTRAVENOUS
  Filled 2014-04-08: qty 14

## 2014-04-08 MED ORDER — SODIUM CHLORIDE 0.9 % IV SOLN
Freq: Once | INTRAVENOUS | Status: AC
  Administered 2014-04-08: 10:00:00 via INTRAVENOUS

## 2014-04-08 MED ORDER — ACETAMINOPHEN 325 MG PO TABS
ORAL_TABLET | ORAL | Status: AC
  Filled 2014-04-08: qty 2

## 2014-04-08 MED ORDER — ONDANSETRON 16 MG/50ML IVPB (CHCC)
16.0000 mg | Freq: Once | INTRAVENOUS | Status: AC
  Administered 2014-04-08: 16 mg via INTRAVENOUS

## 2014-04-08 MED ORDER — TRASTUZUMAB CHEMO INJECTION 440 MG
6.0000 mg/kg | Freq: Once | INTRAVENOUS | Status: AC
  Administered 2014-04-08: 462 mg via INTRAVENOUS
  Filled 2014-04-08: qty 22

## 2014-04-08 MED ORDER — ACETAMINOPHEN 325 MG PO TABS
650.0000 mg | ORAL_TABLET | Freq: Once | ORAL | Status: AC
  Administered 2014-04-08: 650 mg via ORAL

## 2014-04-08 NOTE — Assessment & Plan Note (Addendum)
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  Chemotherapy:  TC HP x2 cycles ( developed hand-foot syndrome) GC HP x1 cycle (developed dehydration and renal failure) CHP from 04/08/2014  Chemotherapy toxicities: Patient of the falling chemotherapy toxicities 1. Hand-foot syndrome 2. Dehydration and renal failure 3. Severe fatigue related to chemotherapy 4. Nausea and vomiting related to chemotherapy 5. Diarrhea related to Perjeta 6. Chemotherapy-induced anemia  Patient will receive carboplatin Herceptin and Perjeta for 3 cycles starting today. Return to clinic in one week for toxicity check and for IV fluids Continuing to closely monitor for chemotherapy toxicities

## 2014-04-08 NOTE — Progress Notes (Signed)
Patient Care Team: Darreld Mclean, MD as PCP - General (Family Medicine) Rulon Eisenmenger, MD as Consulting Physician (Hematology and Oncology) [name], MD as Consulting Physician (General Surgery) Thea Silversmith, MD as Consulting Physician (Radiation Oncology)  DIAGNOSIS: Breast cancer of upper-outer quadrant of right female breast   Staging form: Breast, AJCC 7th Edition     Clinical: Stage IIA (T2, N0, cM0) - Unsigned       Staging comments: Staged at breast conference 12/29/13.      Pathologic: No stage assigned - Unsigned   SUMMARY OF ONCOLOGIC HISTORY:   Breast cancer of upper-outer quadrant of right female breast   12/16/2013 Mammogram 1. A palpable 4.1 cm diameter mass/asymmetry with associated coarse heterogeneous microcalcifications at 11 o'clock 2 cm from the right nipple demonstrates mammographic and sonographic features highly suggestive of malignancy.    12/16/2013 Initial Biopsy Breast cancer of upper-outer quadrant of right female breast; Invasive Ductal Cancer Er 100%, PR: 0%; Her2 Positive Ratio 4.28: Ki 67:24%;    12/23/2013 Breast MRI Right Breast 4.4 x 3 x 2.5 cm and a sub cm satellite lesion. No LN   01/28/2014 - 03/18/2014 Neo-Adjuvant Chemotherapy Taxotere, carboplatin, Herceptin, Perjeta every 3 weeks , after 2 cycles patient developed hand exfoliating rash related to Taxotere. Chemotherapy changed to gemcitabine, carboplatin, Herceptin, Perjeta from 03/18/2014   04/08/2014 -  Neo-Adjuvant Chemotherapy Carboplatin, Herceptin and Perjeta (patient could not tolerate [employer]) plan is for 3 cycles    CHIEF COMPLIANT: carboplatin Herceptin and Perjeta   INTERVAL HISTORY: [name] is a 58 year old Caucasian lady with above-mentioned history of breast cancer was currently on neoadjuvant chemotherapy. Initially she got Taxotere carboplatin Herceptin and Perjeta but developed hand-foot syndrome after 2 cycles. This was discontinued and was changed to carboplatin  gemcitabine Herceptin Perjeta. But patient now profound fatigue , dehydration and had received IV fluids. So we decided to drop gemcitabine and give her carboplatin Herceptin and Perjeta starting today. The plan is to give her 3 more cycles. She complains of severe pain in the legs. It is not classic for neuropathy it appears that she had previous arthritis problems including arthritis. But the symptoms are not classic for rheumatoid arthritis either. It appears that she has constant pain. She takes oxycodone which helps her with the pain. He does not appear to be getting better or worse with chemotherapy. But over the past 3 weeks her pain symptom had markedly worsened.  REVIEW OF SYSTEMS:   Constitutional: Denies fevers, chills or abnormal weight loss alopecia Eyes: Denies blurriness of vision Ears, nose, mouth, throat, and face: Denies mucositis or sore throat Respiratory: Denies cough, dyspnea or wheezes Cardiovascular: Denies palpitation, chest discomfort or lower extremity swelling Gastrointestinal:  Denies nausea, heartburn or change in bowel habits Skin: Denies abnormal skin rashes Lymphatics: Denies new lymphadenopathy or easy bruising Neurological:pain in the legs Behavioral/Psych: Mood is stable, no new changes  All other systems were reviewed with the patient and are negative.  I have reviewed the past medical history, past surgical history, social history and family history with the patient and they are unchanged from previous note.  ALLERGIES:  is allergic to codeine.  MEDICATIONS:  Current Outpatient Prescriptions  Medication Sig Dispense Refill  . ALPRAZolam (XANAX) 0.5 MG tablet TAKE 1 TABLET 3 TIMES A DAY AS NEEDED 90 tablet 0  . Alum & Mag Hydroxide-Simeth (MAGIC MOUTHWASH W/LIDOCAINE) SOLN Take 5 mLs by mouth 4 (four) times daily as needed for mouth pain (duke's  formula with nystatin and lidocaine; all in 1:1 combo please.). 240 mL 1  . atenolol (TENORMIN) 50 MG tablet  Take 50 mg by mouth 2 (two) times daily.    . fluconazole (DIFLUCAN) 100 MG tablet Take 1 tablet (100 mg total) by mouth daily. 7 tablet 0  . gabapentin (NEURONTIN) 100 MG capsule Take 1 capsule (100 mg total) by mouth 3 (three) times daily. 90 capsule 3  . lidocaine-prilocaine (EMLA) cream Apply 1 application topically as needed (port a cath site). Apply to [facility] a cath site 2 hours prior to chemotherapy appointment    . LORazepam (ATIVAN) 0.5 MG tablet Take 2 tablets (1 mg total) by mouth at bedtime. 30 tablet 0  . naproxen sodium (ANAPROX) 220 MG tablet Take 440-660 mg by mouth 2 (two) times daily.     Marland Kitchen nystatin (MYCOSTATIN/NYSTOP) 100000 UNIT/GM POWD Apply to the affected area daily until resolved. 60 g 1  . omeprazole (PRILOSEC) 20 MG capsule Take 20 mg by mouth every evening.     . ondansetron (ZOFRAN) 8 MG tablet Take 1 tablet (8 mg total) by mouth every 8 (eight) hours as needed for nausea or vomiting (nausea). 30 tablet 1  . oxyCODONE-acetaminophen (ROXICET) 5-325 MG per tablet Take 1 tablet by mouth every 4 (four) hours as needed for severe pain (pain). 40 tablet 0  . prochlorperazine (COMPAZINE) 10 MG tablet Take 1 tablet (10 mg total) by mouth every 6 (six) hours as needed (Nausea or vomiting). 30 tablet 1  . sertraline (ZOLOFT) 50 MG tablet Take 1 tablet (50 mg total) by mouth at bedtime. 90 tablet 3   No current facility-administered medications for this visit.    PHYSICAL EXAMINATION: ECOG PERFORMANCE STATUS: 1 - Symptomatic but completely ambulatory  Filed Vitals:   04/08/14 0833  BP: 113/67  Pulse: 66  Temp: 98.3 F (36.8 C)  Resp: 18   Filed Weights   04/08/14 0833  Weight: 159 lb 8 oz (72.349 kg)    GENERAL:alert, no distress and comfortable SKIN: skin color, texture, turgor are normal, no rashes or significant lesions EYES: normal, Conjunctiva are pink and non-injected, sclera clear OROPHARYNX:no exudate, no erythema and lips, buccal mucosa, and tongue normal   NECK: supple, thyroid normal size, non-tender, without nodularity LYMPH:  no palpable lymphadenopathy in the cervical, axillary or inguinal LUNGS: clear to auscultation and percussion with normal breathing effort HEART: regular rate & rhythm and no murmurs and no lower extremity edema ABDOMEN:abdomen soft, non-tender and normal bowel sounds Musculoskeletal:no cyanosis of digits and no clubbing  NEURO: alert & oriented x 3 with fluent speech, no focal motor/sensory deficits  LABORATORY DATA:  I have reviewed the data as listed   Chemistry      Component Value Date/Time   NA 139 04/08/2014 0824   NA 130* 02/26/2014 1238   K 3.7 04/08/2014 0824   K 4.0 02/26/2014 1238   CL 96 02/26/2014 1238   CO2 23 04/08/2014 0824   CO2 20 02/26/2014 1238   BUN 3.7* 04/08/2014 0824   BUN 7 02/26/2014 1238   CREATININE 0.8 04/08/2014 0824   CREATININE 0.59 02/26/2014 1238   CREATININE 0.76 04/03/2013 1420      Component Value Date/Time   CALCIUM 8.9 04/08/2014 0824   CALCIUM 8.6 02/26/2014 1238   ALKPHOS 181* 04/08/2014 0824   ALKPHOS 151* 02/26/2014 1238   AST 19 04/08/2014 0824   AST 25 02/26/2014 1238   ALT 11 04/08/2014 0824   ALT 38*  02/26/2014 1238   BILITOT 0.48 04/08/2014 0824   BILITOT 0.7 02/26/2014 1238       Lab Results  Component Value Date   WBC 10.3 04/08/2014   HGB 11.4* 04/08/2014   HCT 34.1* 04/08/2014   MCV 91.2 04/08/2014   PLT 349 04/08/2014   NEUTROABS 7.3* 04/08/2014   ASSESSMENT & PLAN:  Breast cancer of 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  Chemotherapy:  TC HP x2 cycles ( developed hand-foot syndrome) GC HP x1 cycle (developed dehydration and renal failure) CHP from  04/08/2014  Chemotherapy toxicities: Patient of the falling chemotherapy toxicities 1. Hand-foot syndrome: Resolved with stopping Taxotere 2. Dehydration and renal failure: Creatinine is normal she is drinking more water and it appears to have improved from dehydration. 3. Severe fatigue related to chemotherapy: Rate drop gemcitabine we will see if she does gets better. 4. Nausea and vomiting related to chemotherapy 5. Diarrhea related to Perjeta: Patient has Imodium to take as needed. 6. Chemotherapy-induced anemia: Does not need to be treated. She is asymptomatic 7. Leg pains: Musculoskeletal in nature. Worsened by joint moment. She is currently on oxycodone. I will prescribe her Neurontin 100 mg by mouth 3 times a day in case this is neuropathic in nature.  Patient will receive carboplatin Herceptin and Perjeta for 3 cycles starting today. Return to clinic in one week for toxicity check and for IV fluids Continuing to closely monitor for chemotherapy toxicities   No orders of the defined types were placed in this encounter.   The patient has a good understanding of the overall plan. she agrees with it. She will call with any problems that may develop before her next visit here.   Rulon Eisenmenger, MD 04/08/2014 9:33 AM

## 2014-04-08 NOTE — Patient Instructions (Signed)
[facility] Discharge Instructions for Patients Receiving Chemotherapy  Today you received the following chemotherapy agents Herceptin/Perjeta/Carboplatin.   To help prevent nausea and vomiting after your treatment, we encourage you to take your nausea medication as directed.    If you develop nausea and vomiting that is not controlled by your nausea medication, call the clinic.   BELOW ARE SYMPTOMS THAT SHOULD BE REPORTED IMMEDIATELY:  *FEVER GREATER THAN 100.5 F  *CHILLS WITH OR WITHOUT FEVER  NAUSEA AND VOMITING THAT IS NOT CONTROLLED WITH YOUR NAUSEA MEDICATION  *UNUSUAL SHORTNESS OF BREATH  *UNUSUAL BRUISING OR BLEEDING  TENDERNESS IN MOUTH AND THROAT WITH OR WITHOUT PRESENCE OF ULCERS  *URINARY PROBLEMS  *BOWEL PROBLEMS  UNUSUAL RASH Items with * indicate a potential emergency and should be followed up as soon as possible.  Feel free to call the clinic you have any questions or concerns. The clinic phone number is [phone].

## 2014-04-08 NOTE — Telephone Encounter (Signed)
, °

## 2014-04-09 ENCOUNTER — Ambulatory Visit: Payer: Medicaid Other

## 2014-04-13 ENCOUNTER — Other Ambulatory Visit: Payer: Self-pay | Admitting: *Deleted

## 2014-04-13 DIAGNOSIS — C50411 Malignant neoplasm of upper-outer quadrant of right female breast: Secondary | ICD-10-CM

## 2014-04-15 ENCOUNTER — Encounter: Payer: Self-pay | Admitting: Adult Health

## 2014-04-15 ENCOUNTER — Ambulatory Visit (HOSPITAL_BASED_OUTPATIENT_CLINIC_OR_DEPARTMENT_OTHER): Payer: Medicaid Other

## 2014-04-15 ENCOUNTER — Ambulatory Visit: Payer: Medicaid Other | Admitting: Hematology and Oncology

## 2014-04-15 ENCOUNTER — Telehealth: Payer: Self-pay | Admitting: Adult Health

## 2014-04-15 ENCOUNTER — Ambulatory Visit: Payer: Medicaid Other

## 2014-04-15 ENCOUNTER — Ambulatory Visit (HOSPITAL_BASED_OUTPATIENT_CLINIC_OR_DEPARTMENT_OTHER): Payer: Medicaid Other | Admitting: Adult Health

## 2014-04-15 ENCOUNTER — Other Ambulatory Visit (HOSPITAL_BASED_OUTPATIENT_CLINIC_OR_DEPARTMENT_OTHER): Payer: Medicaid Other

## 2014-04-15 ENCOUNTER — Other Ambulatory Visit: Payer: Medicaid Other

## 2014-04-15 VITALS — BP 133/75 | HR 68 | Temp 97.6°F | Resp 18

## 2014-04-15 VITALS — BP 111/68 | HR 67 | Temp 98.1°F | Resp 18 | Ht 64.0 in | Wt 157.1 lb

## 2014-04-15 DIAGNOSIS — C50411 Malignant neoplasm of upper-outer quadrant of right female breast: Secondary | ICD-10-CM

## 2014-04-15 DIAGNOSIS — R110 Nausea: Secondary | ICD-10-CM

## 2014-04-15 DIAGNOSIS — E860 Dehydration: Secondary | ICD-10-CM

## 2014-04-15 DIAGNOSIS — E876 Hypokalemia: Secondary | ICD-10-CM

## 2014-04-15 DIAGNOSIS — C50811 Malignant neoplasm of overlapping sites of right female breast: Secondary | ICD-10-CM

## 2014-04-15 DIAGNOSIS — N179 Acute kidney failure, unspecified: Secondary | ICD-10-CM

## 2014-04-15 DIAGNOSIS — R112 Nausea with vomiting, unspecified: Secondary | ICD-10-CM

## 2014-04-15 DIAGNOSIS — Z170 Estrogen receptor positive status [ER+]: Secondary | ICD-10-CM

## 2014-04-15 LAB — COMPREHENSIVE METABOLIC PANEL (CC13)
ALBUMIN: 3.3 g/dL — AB (ref 3.5–5.0)
ALK PHOS: 146 U/L (ref 40–150)
ALT: 19 U/L (ref 0–55)
AST: 20 U/L (ref 5–34)
Anion Gap: 12 mEq/L — ABNORMAL HIGH (ref 3–11)
BUN: 12.6 mg/dL (ref 7.0–26.0)
CO2: 24 mEq/L (ref 22–29)
Calcium: 8.1 mg/dL — ABNORMAL LOW (ref 8.4–10.4)
Chloride: 92 mEq/L — ABNORMAL LOW (ref 98–109)
Creatinine: 1.5 mg/dL — ABNORMAL HIGH (ref 0.6–1.1)
Glucose: 102 mg/dl (ref 70–140)
Potassium: 2.9 mEq/L — CL (ref 3.5–5.1)
SODIUM: 128 meq/L — AB (ref 136–145)
Total Bilirubin: 0.58 mg/dL (ref 0.20–1.20)
Total Protein: 6.4 g/dL (ref 6.4–8.3)

## 2014-04-15 LAB — CBC WITH DIFFERENTIAL/PLATELET
BASO%: 0.4 % (ref 0.0–2.0)
Basophils Absolute: 0 10*3/uL (ref 0.0–0.1)
EOS ABS: 0.1 10*3/uL (ref 0.0–0.5)
EOS%: 1.8 % (ref 0.0–7.0)
HCT: 30.3 % — ABNORMAL LOW (ref 34.8–46.6)
HGB: 10.1 g/dL — ABNORMAL LOW (ref 11.6–15.9)
LYMPH#: 1.9 10*3/uL (ref 0.9–3.3)
LYMPH%: 35.1 % (ref 14.0–49.7)
MCH: 30 pg (ref 25.1–34.0)
MCHC: 33.3 g/dL (ref 31.5–36.0)
MCV: 90.2 fL (ref 79.5–101.0)
MONO#: 0.5 10*3/uL (ref 0.1–0.9)
MONO%: 9.6 % (ref 0.0–14.0)
NEUT%: 53.1 % (ref 38.4–76.8)
NEUTROS ABS: 2.9 10*3/uL (ref 1.5–6.5)
Platelets: 250 10*3/uL (ref 145–400)
RBC: 3.35 10*6/uL — AB (ref 3.70–5.45)
RDW: 15.5 % — AB (ref 11.2–14.5)
WBC: 5.5 10*3/uL (ref 3.9–10.3)

## 2014-04-15 MED ORDER — SODIUM CHLORIDE 0.9 % IJ SOLN
10.0000 mL | INTRAMUSCULAR | Status: DC | PRN
  Administered 2014-04-15: 10 mL via INTRAVENOUS
  Filled 2014-04-15: qty 10

## 2014-04-15 MED ORDER — ONDANSETRON 16 MG/50ML IVPB (CHCC)
INTRAVENOUS | Status: AC
Start: 2014-04-15 — End: 2014-04-15
  Filled 2014-04-15: qty 16

## 2014-04-15 MED ORDER — POTASSIUM CHLORIDE CRYS ER 20 MEQ PO TBCR
40.0000 meq | EXTENDED_RELEASE_TABLET | Freq: Once | ORAL | Status: AC
  Administered 2014-04-15: 40 meq via ORAL
  Filled 2014-04-15: qty 2

## 2014-04-15 MED ORDER — OXYCODONE-ACETAMINOPHEN 5-325 MG PO TABS: 1.0000 | ORAL_TABLET | ORAL | Status: DC | PRN

## 2014-04-15 MED ORDER — POTASSIUM CHLORIDE CRYS ER 20 MEQ PO TBCR: 20.0000 meq | EXTENDED_RELEASE_TABLET | Freq: Two times a day (BID) | ORAL | Status: DC

## 2014-04-15 MED ORDER — SODIUM CHLORIDE 0.9 % IV SOLN
INTRAVENOUS | Status: DC
  Administered 2014-04-15: 11:00:00 via INTRAVENOUS
  Filled 2014-04-15: qty 1000

## 2014-04-15 MED ORDER — ONDANSETRON 16 MG/50ML IVPB (CHCC)
16.0000 mg | Freq: Once | INTRAVENOUS | Status: AC
Start: 2014-04-15 — End: 2014-04-15
  Administered 2014-04-15: 16 mg via INTRAVENOUS

## 2014-04-15 MED ORDER — HEPARIN SOD (PORK) LOCK FLUSH 100 UNIT/ML IV SOLN
500.0000 [IU] | Freq: Once | INTRAVENOUS | Status: AC
  Administered 2014-04-15: 500 [IU] via INTRAVENOUS
  Filled 2014-04-15: qty 5

## 2014-04-15 NOTE — Patient Instructions (Signed)
Dehydration, Adult Dehydration is when you lose more fluids from the body than you take in. Vital organs like the kidneys, brain, and heart cannot function without a proper amount of fluids and salt. Any loss of fluids from the body can cause dehydration.  CAUSES   Vomiting.  Diarrhea.  Excessive sweating.  Excessive urine output.  Fever. SYMPTOMS  Mild dehydration  Thirst.  Dry lips.  Slightly dry mouth. Moderate dehydration  Very dry mouth.  Sunken eyes.  Skin does not bounce back quickly when lightly pinched and released.  Dark urine and decreased urine production.  Decreased tear production.  Headache. Severe dehydration  Very dry mouth.  Extreme thirst.  Rapid, weak pulse (more than 100 beats per minute at rest).  Cold hands and feet.  Not able to sweat in spite of heat and temperature.  Rapid breathing.  Blue lips.  Confusion and lethargy.  Difficulty being awakened.  Minimal urine production.  No tears. DIAGNOSIS  Your caregiver will diagnose dehydration based on your symptoms and your exam. Blood and urine tests will help confirm the diagnosis. The diagnostic evaluation should also identify the cause of dehydration. TREATMENT  Treatment of mild or moderate dehydration can often be done at home by increasing the amount of fluids that you drink. It is best to drink small amounts of fluid more often. Drinking too much at one time can make vomiting worse. Refer to the home care instructions below. Severe dehydration needs to be treated at the hospital where you will probably be given intravenous (IV) fluids that contain water and electrolytes. HOME CARE INSTRUCTIONS   Ask your caregiver about specific rehydration instructions.  Drink enough fluids to keep your urine clear or pale yellow.  Drink small amounts frequently if you have nausea and vomiting.  Eat as you normally do.  Avoid:  Foods or drinks high in sugar.  Carbonated  drinks.  Juice.  Extremely hot or cold fluids.  Drinks with caffeine.  Fatty, greasy foods.  Alcohol.  Tobacco.  Overeating.  Gelatin desserts.  Wash your hands well to avoid spreading bacteria and viruses.  Only take over-the-counter or prescription medicines for pain, discomfort, or fever as directed by your caregiver.  Ask your caregiver if you should continue all prescribed and over-the-counter medicines.  Keep all follow-up appointments with your caregiver. SEEK MEDICAL CARE IF:  You have abdominal pain and it increases or stays in one area (localizes).  You have a rash, stiff neck, or severe headache.  You are irritable, sleepy, or difficult to awaken.  You are weak, dizzy, or extremely thirsty. SEEK IMMEDIATE MEDICAL CARE IF:   You are unable to keep fluids down or you get worse despite treatment.  You have frequent episodes of vomiting or diarrhea.  You have blood or green matter (bile) in your vomit.  You have blood in your stool or your stool looks black and tarry.  You have not urinated in 6 to 8 hours, or you have only urinated a small amount of very dark urine.  You have a fever.  You faint. MAKE SURE YOU:   Understand these instructions.  Will watch your condition.  Will get help right away if you are not doing well or get worse. Document Released: 05/06/2005 Document Revised: 07/29/2011 Document Reviewed: 12/24/2010 Exit[employer] Patient Information 2015 [employer]. This information is not intended to replace advice given to you by your health care provider. Make sure you discuss any questions you have with your health care   provider.  

## 2014-04-15 NOTE — Patient Instructions (Signed)
Dehydration, Adult Dehydration is when you lose more fluids from the body than you take in. Vital organs like the kidneys, brain, and heart cannot function without a proper amount of fluids and salt. Any loss of fluids from the body can cause dehydration.  CAUSES   Vomiting.  Diarrhea.  Excessive sweating.  Excessive urine output.  Fever. SYMPTOMS  Mild dehydration  Thirst.  Dry lips.  Slightly dry mouth. Moderate dehydration  Very dry mouth.  Sunken eyes.  Skin does not bounce back quickly when lightly pinched and released.  Dark urine and decreased urine production.  Decreased tear production.  Headache. Severe dehydration  Very dry mouth.  Extreme thirst.  Rapid, weak pulse (more than 100 beats per minute at rest).  Cold hands and feet.  Not able to sweat in spite of heat and temperature.  Rapid breathing.  Blue lips.  Confusion and lethargy.  Difficulty being awakened.  Minimal urine production.  No tears. DIAGNOSIS  Your caregiver will diagnose dehydration based on your symptoms and your exam. Blood and urine tests will help confirm the diagnosis. The diagnostic evaluation should also identify the cause of dehydration. TREATMENT  Treatment of mild or moderate dehydration can often be done at home by increasing the amount of fluids that you drink. It is best to drink small amounts of fluid more often. Drinking too much at one time can make vomiting worse. Refer to the home care instructions below. Severe dehydration needs to be treated at the hospital where you will probably be given intravenous (IV) fluids that contain water and electrolytes. HOME CARE INSTRUCTIONS   Ask your caregiver about specific rehydration instructions.  Drink enough fluids to keep your urine clear or pale yellow.  Drink small amounts frequently if you have nausea and vomiting.  Eat as you normally do.  Avoid:  Foods or drinks high in sugar.  Carbonated  drinks.  Juice.  Extremely hot or cold fluids.  Drinks with caffeine.  Fatty, greasy foods.  Alcohol.  Tobacco.  Overeating.  Gelatin desserts.  Wash your hands well to avoid spreading bacteria and viruses.  Only take over-the-counter or prescription medicines for pain, discomfort, or fever as directed by your caregiver.  Ask your caregiver if you should continue all prescribed and over-the-counter medicines.  Keep all follow-up appointments with your caregiver. SEEK MEDICAL CARE IF:  You have abdominal pain and it increases or stays in one area (localizes).  You have a rash, stiff neck, or severe headache.  You are irritable, sleepy, or difficult to awaken.  You are weak, dizzy, or extremely thirsty. SEEK IMMEDIATE MEDICAL CARE IF:   You are unable to keep fluids down or you get worse despite treatment.  You have frequent episodes of vomiting or diarrhea.  You have blood or green matter (bile) in your vomit.  You have blood in your stool or your stool looks black and tarry.  You have not urinated in 6 to 8 hours, or you have only urinated a small amount of very dark urine.  You have a fever.  You faint. MAKE SURE YOU:   Understand these instructions.  Will watch your condition.  Will get help right away if you are not doing well or get worse. Document Released: 05/06/2005 Document Revised: 07/29/2011 Document Reviewed: 12/24/2010 [employer] Patient Information 2015 [employer]. This information is not intended to replace advice given to you by your health care provider. Make sure you discuss any questions you have with your health care   provider. Hypokalemia Hypokalemia means that the amount of potassium in the blood is lower than normal.Potassium is a chemical, called an electrolyte, that helps regulate the amount of fluid in the body. It also stimulates muscle contraction and helps nerves function properly.Most of the body's potassium is inside of  cells, and only a very small amount is in the blood. Because the amount in the blood is so small, minor changes can be life-threatening. CAUSES  Antibiotics.  Diarrhea or vomiting.  Using laxatives too much, which can cause diarrhea.  Chronic kidney disease.  Water pills (diuretics).  Eating disorders (bulimia).  Low magnesium level.  Sweating a lot. SIGNS AND SYMPTOMS  Weakness.  Constipation.  Fatigue.  Muscle cramps.  Mental confusion.  Skipped heartbeats or irregular heartbeat (palpitations).  Tingling or numbness. DIAGNOSIS  Your health care provider can diagnose hypokalemia with blood tests. In addition to checking your potassium level, your health care provider may also check other lab tests. TREATMENT Hypokalemia can be treated with potassium supplements taken by mouth or adjustments in your current medicines. If your potassium level is very low, you may need to get potassium through a vein (IV) and be monitored in the hospital. A diet high in potassium is also helpful. Foods high in potassium are:  Nuts, such as peanuts and pistachios.  Seeds, such as sunflower seeds and pumpkin seeds.  Peas, lentils, and lima beans.  Whole grain and bran cereals and breads.  Fresh fruit and vegetables, such as apricots, avocado, bananas, cantaloupe, kiwi, oranges, tomatoes, asparagus, and potatoes.  Orange and tomato juices.  Red meats.  Fruit yogurt. HOME CARE INSTRUCTIONS  Take all medicines as prescribed by your health care provider.  Maintain a healthy diet by including nutritious food, such as fruits, vegetables, nuts, whole grains, and lean meats.  If you are taking a laxative, be sure to follow the directions on the label. SEEK MEDICAL CARE IF:  Your weakness gets worse.  You feel your heart pounding or racing.  You are vomiting or having diarrhea.  You are diabetic and having trouble keeping your blood glucose in the normal range. SEEK IMMEDIATE  MEDICAL CARE IF:  You have chest pain, shortness of breath, or dizziness.  You are vomiting or having diarrhea for more than 2 days.  You faint. MAKE SURE YOU:   Understand these instructions.  Will watch your condition.  Will get help right away if you are not doing well or get worse. Document Released: 05/06/2005 Document Revised: 02/24/2013 Document Reviewed: 11/06/2012 Exit[employer] Patient Information 2015 [employer]. This information is not intended to replace advice given to you by your health care provider. Make sure you discuss any questions you have with your health care provider.  

## 2014-04-15 NOTE — Telephone Encounter (Signed)
, °

## 2014-04-15 NOTE — Progress Notes (Signed)
Patient Care Team: Darreld Mclean, MD as PCP - General (Family Medicine) Rulon Eisenmenger, MD as Consulting Physician (Hematology and Oncology) [name], MD as Consulting Physician (General Surgery) Thea Silversmith, MD as Consulting Physician (Radiation Oncology)  DIAGNOSIS: Breast cancer of upper-outer quadrant of right female breast   Staging form: Breast, AJCC 7th Edition     Clinical: Stage IIA (T2, N0, cM0) - Unsigned       Staging comments: Staged at breast conference 12/29/13.      Pathologic: No stage assigned - Unsigned   SUMMARY OF ONCOLOGIC HISTORY:   Breast cancer of upper-outer quadrant of right female breast   12/16/2013 Mammogram 1. A palpable 4.1 cm diameter mass/asymmetry with associated coarse heterogeneous microcalcifications at 11 o'clock 2 cm from the right nipple demonstrates mammographic and sonographic features highly suggestive of malignancy.    12/16/2013 Initial Biopsy Breast cancer of upper-outer quadrant of right female breast; Invasive Ductal Cancer Er 100%, PR: 0%; Her2 Positive Ratio 4.28: Ki 67:24%;    12/23/2013 Breast MRI Right Breast 4.4 x 3 x 2.5 cm and a sub cm satellite lesion. No LN   01/28/2014 - 03/18/2014 Neo-Adjuvant Chemotherapy Taxotere, carboplatin, Herceptin, Perjeta every 3 weeks , after 2 cycles patient developed hand exfoliating rash related to Taxotere. Chemotherapy changed to gemcitabine, carboplatin, Herceptin, Perjeta from 03/18/2014   04/08/2014 -  Neo-Adjuvant Chemotherapy Carboplatin, Herceptin and Perjeta (patient could not tolerate [employer]) plan is for 3 cycles    CHIEF COMPLIANT: carboplatin Herceptin and Perjeta   INTERVAL HISTORY: [name] is a 58 year old Caucasian lady with above-mentioned history of breast cancer was currently on neoadjuvant chemotherapy. Initially she got Taxotere carboplatin Herceptin and Perjeta but developed hand-foot syndrome after 2 cycles. This was discontinued and was changed to carboplatin  gemcitabine Herceptin Perjeta. But patient now profound fatigue , dehydration and had received IV fluids. So we decided to drop gemcitabine and give her carboplatin Herceptin and Perjeta starting 04/08/14.  She is currently cycle 1 day 8 of 3 planned treatments.  She is doing moderately well.  She is requesting a refill of her Oxycodone for her rheumatoid arthritis.  She has been very nauseated and hasn't been able to eat.  She has been drinking, but not that much.  She is taking Zofran and Compazine and they are helping minimally if at all.  She has had mild constipation on occasion alternated with diarrhea.  She has dulcolax and imodium to manage these symptoms.  She denies fevers, chills, vomiting, skin change, numbness/tingling or any further concerns.   REVIEW OF SYSTEMS:   A 10 point review of systems was conducted and is otherwise negative except for what is noted above.    Past Medical History  Diagnosis Date  . 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), hypoglycemia 2/04-resolved with PO calcium + D, SVT, was treated with iterferon and ribavirin-10/3-2/04  . GERD (gastroesophageal reflux disease)   . PUD (peptic ulcer disease)   . Fibromyalgia   . Rheumatoid arthritis(714.0)     possible  . SVT (supraventricular tachycardia)   . Depression   . Osteoporosis   . Hepatitis C     Pt was given too much interferon - critical care x2d, 5 days total hosp  . Anxiety   . Wears glasses   . Heavy smoker    Past Surgical History  Procedure Laterality Date  . Cesarean section  09/17/80  .  Dilation and curettage of uterus    . Hysterectomy-for dysmenorrhea  09/17/00  . Nasal sinus surgery  03/20/84    for deviated septum  . Tonsillectomy  05/20/61  . Abdominal hysterectomy    . Colonoscopy    . Upper gi endoscopy    . Portacath placement Left 01/20/2014    Procedure: INSERTION PORT-A-CATH;  Surgeon: [name], MD;   Location: [facility];  Service: General;  Laterality: Left;   Family History  Problem Relation Age of Onset  . Colon cancer Neg Hx   . Cancer Father 42    lung  . Dementia Father   . Dementia Mother   . Hypertension Mother   . Hyperlipidemia Mother   . Breast cancer Maternal Aunt   . Cancer Paternal Uncle   . Breast cancer Maternal Aunt   . Breast cancer Maternal Aunt    History   Social History  . Marital Status: Divorced    Spouse Name: N/A    Number of Children: N/A  . Years of Education: N/A   Social History Main Topics  . Smoking status: Current Every Day Smoker -- 2.00 packs/day for 45 years    Types: Cigarettes  . Smokeless tobacco: Never Used     Comment: 2 ppd since 58 yo   . Alcohol Use: Yes     Comment: denies; reports 3-4 beers/night to Dr. 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 Sexual Activity: Not Currently    Birth Control/ Protection: Surgical   Other Topics Concern  . None   Social History Narrative   Divorced, lives with son [name] (born [birth year]); parents nearby. 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    Hepatitis B & C   Has 2 dogs and 5 cats   Did inpatient substance abuse rehab in [place]:  is allergic to codeine.  MEDICATIONS:  Current Outpatient Prescriptions  Medication Sig Dispense Refill  . atenolol (TENORMIN) 50 MG tablet Take 50 mg by mouth 2 (two) times daily.    Marland Kitchen gabapentin (NEURONTIN) 100 MG capsule Take 1 capsule (100 mg total) by mouth 3 (three) times daily. 90 capsule 3  . lidocaine-prilocaine (EMLA) cream Apply 1 application topically as needed (port a cath site). Apply to [facility] a cath site 2 hours prior to chemotherapy appointment    . LORazepam (ATIVAN) 0.5 MG tablet Take 2 tablets (1 mg total) by mouth  at bedtime. 30 tablet 0  . omeprazole (PRILOSEC) 20 MG capsule Take 20 mg by mouth every evening.     . ondansetron (ZOFRAN) 8 MG tablet Take 1 tablet (8 mg total) by mouth every 8 (eight) hours as needed for nausea or vomiting (nausea). 30 tablet 1  . oxyCODONE-acetaminophen (ROXICET) 5-325 MG per tablet Take 1 tablet by mouth every 4 (four) hours as needed for severe pain (pain). 40 tablet 0  . prochlorperazine (COMPAZINE) 10 MG tablet Take 1 tablet (10 mg total) by mouth every 6 (six) hours as needed (Nausea or vomiting). 30 tablet 1  . sertraline (ZOLOFT) 50 MG tablet Take 1 tablet (50 mg total) by mouth at bedtime. 90 tablet 3  . ALPRAZolam (XANAX) 0.5 MG tablet TAKE 1 TABLET 3 TIMES  A DAY AS NEEDED (Patient not taking: Reported on 04/15/2014) 90 tablet 0  . Alum & Mag Hydroxide-Simeth (MAGIC MOUTHWASH W/LIDOCAINE) SOLN Take 5 mLs by mouth 4 (four) times daily as needed for mouth pain (duke's formula with nystatin and lidocaine; all in 1:1 combo please.). (Patient not taking: Reported on 04/15/2014) 240 mL 1  . fluconazole (DIFLUCAN) 100 MG tablet Take 1 tablet (100 mg total) by mouth daily. (Patient not taking: Reported on 04/15/2014) 7 tablet 0  . naproxen sodium (ANAPROX) 220 MG tablet Take 440-660 mg by mouth 2 (two) times daily.     Marland Kitchen nystatin (MYCOSTATIN/NYSTOP) 100000 UNIT/GM POWD Apply to the affected area daily until resolved. (Patient not taking: Reported on 04/15/2014) 60 g 1   No current facility-administered medications for this visit.    PHYSICAL EXAMINATION: ECOG PERFORMANCE STATUS: 1 - Symptomatic but completely ambulatory  Filed Vitals:   04/15/14 0955  BP: 111/68  Pulse: 67  Temp: 98.1 F (36.7 C)  Resp: 18   Filed Weights   04/15/14 0955  Weight: 157 lb 1.6 oz (71.26 kg)   GENERAL: Patient is a well appearing female in no acute distress HEENT:  Sclerae anicteric.  Oropharynx clear and moist. No ulcerations or evidence of oropharyngeal candidiasis. Neck is  supple.  NODES:  No cervical, supraclavicular, or axillary lymphadenopathy palpated.  BREAST EXAM:  Deferred. LUNGS:  Clear to auscultation bilaterally.  No wheezes or rhonchi. HEART:  Regular rate and rhythm. No murmur appreciated. ABDOMEN:  Soft, nontender.  Positive, normoactive bowel sounds. No organomegaly palpated. MSK:  No focal spinal tenderness to palpation. Full range of motion bilaterally in the upper extremities. EXTREMITIES:  No peripheral edema.   SKIN:  Clear with no obvious rashes or skin changes. No nail dyscrasia. NEURO:  Nonfocal. Well oriented.  Appropriate affect.    LABORATORY DATA:  I have reviewed the data as listed   Chemistry      Component Value Date/Time   NA 128* 04/15/2014 0933   NA 130* 02/26/2014 1238   K 2.9* 04/15/2014 0933   K 4.0 02/26/2014 1238   CL 96 02/26/2014 1238   CO2 24 04/15/2014 0933   CO2 20 02/26/2014 1238   BUN 12.6 04/15/2014 0933   BUN 7 02/26/2014 1238   CREATININE 1.5* 04/15/2014 0933   CREATININE 0.59 02/26/2014 1238   CREATININE 0.76 04/03/2013 1420      Component Value Date/Time   CALCIUM 8.1* 04/15/2014 0933   CALCIUM 8.6 02/26/2014 1238   ALKPHOS 146 04/15/2014 0933   ALKPHOS 151* 02/26/2014 1238   AST 20 04/15/2014 0933   AST 25 02/26/2014 1238   ALT 19 04/15/2014 0933   ALT 38* 02/26/2014 1238   BILITOT 0.58 04/15/2014 0933   BILITOT 0.7 02/26/2014 1238       Lab Results  Component Value Date   WBC 5.5 04/15/2014   HGB 10.1* 04/15/2014   HCT 30.3* 04/15/2014   MCV 90.2 04/15/2014   PLT 250 04/15/2014   NEUTROABS 2.9 04/15/2014   ASSESSMENT:  Breast cancer of 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  Chemotherapy:  TC HP x2 cycles ( developed  hand-foot syndrome) GC HP x1 cycle (developed dehydration and renal failure) CHP  from 04/08/2014  PLAN:   Arrow is here following her treatment with Carboplatin herceptin and perjeta.  She has acute kidney injury likely multifactorial from dehydration related to chemotherapy induced nausea and vomiting, and possibly the Carboplatin.  She also has hypokalemia. She will receive IV fluids today and tomorrow.  I will also give her a prescription of potassium at home in addition to some in her IV today.  She also has a mild chemotherapy induced anemia that we will monitor.    Her orders for her IV fluids are as follows: IV Normal Saline with 52mq of potassium in 1 liter over 2 hours today.   Zofran 183mIV today.   Kdur 40 meq PO x once today  Tomorrow: Normal saline 1 Liter over 2 hours IV  Zofran 61m87mV if needed tomorrow  (these are both located in the supportive therapy section)  I also prescribed Kdur 74m561mO BID x 1 week.  She will return in one week to re-check potassium and kidney function.    MaryAaralynnl return tomorrow for IV fluids, in one week for lab only, and in 2 weeks for labs and evaluation by [name] Adie The patient has a good understanding of the overall plan. she agrees with it. She will call with any problems that may develop before her next visit here.  I spent 25 minutes counseling the patient face to face.  The total time spent in the appointment was 30 minutes.  [name] MAudrain-[phone] 10:32 AM

## 2014-04-16 ENCOUNTER — Ambulatory Visit: Payer: Medicaid Other

## 2014-04-16 NOTE — Progress Notes (Signed)
Pt did not keep appt for IV fluids. Will make provider aware.

## 2014-04-21 ENCOUNTER — Other Ambulatory Visit: Payer: Self-pay

## 2014-04-21 DIAGNOSIS — C50411 Malignant neoplasm of upper-outer quadrant of right female breast: Secondary | ICD-10-CM

## 2014-04-22 ENCOUNTER — Other Ambulatory Visit: Payer: Self-pay

## 2014-04-22 ENCOUNTER — Other Ambulatory Visit (HOSPITAL_BASED_OUTPATIENT_CLINIC_OR_DEPARTMENT_OTHER): Payer: Medicaid Other

## 2014-04-22 ENCOUNTER — Ambulatory Visit: Payer: Medicaid Other | Admitting: Hematology and Oncology

## 2014-04-22 ENCOUNTER — Other Ambulatory Visit: Payer: Medicaid Other

## 2014-04-22 DIAGNOSIS — C50811 Malignant neoplasm of overlapping sites of right female breast: Secondary | ICD-10-CM

## 2014-04-22 DIAGNOSIS — C50411 Malignant neoplasm of upper-outer quadrant of right female breast: Secondary | ICD-10-CM

## 2014-04-22 LAB — COMPREHENSIVE METABOLIC PANEL (CC13)
ALT: 14 U/L (ref 0–55)
AST: 18 U/L (ref 5–34)
Albumin: 3.5 g/dL (ref 3.5–5.0)
Alkaline Phosphatase: 153 U/L — ABNORMAL HIGH (ref 40–150)
Anion Gap: 11 mEq/L (ref 3–11)
BUN: 4.5 mg/dL — ABNORMAL LOW (ref 7.0–26.0)
CALCIUM: 8.4 mg/dL (ref 8.4–10.4)
CHLORIDE: 104 meq/L (ref 98–109)
CO2: 22 meq/L (ref 22–29)
CREATININE: 1 mg/dL (ref 0.6–1.1)
EGFR: 66 mL/min/{1.73_m2} — ABNORMAL LOW (ref >90)
Glucose: 98 mg/dl (ref 70–140)
Potassium: 4.5 mEq/L (ref 3.5–5.1)
Sodium: 136 mEq/L (ref 136–145)
TOTAL PROTEIN: 6.7 g/dL (ref 6.4–8.3)
Total Bilirubin: 0.41 mg/dL (ref 0.20–1.20)

## 2014-04-22 LAB — CBC WITH DIFFERENTIAL/PLATELET
BASO%: 0.2 % (ref 0.0–2.0)
Basophils Absolute: 0 10*3/uL (ref 0.0–0.1)
EOS ABS: 0 10*3/uL (ref 0.0–0.5)
EOS%: 0.8 % (ref 0.0–7.0)
HCT: 28.7 % — ABNORMAL LOW (ref 34.8–46.6)
HGB: 9.8 g/dL — ABNORMAL LOW (ref 11.6–15.9)
LYMPH%: 40 % (ref 14.0–49.7)
MCH: 30.8 pg (ref 25.1–34.0)
MCHC: 34.1 g/dL (ref 31.5–36.0)
MCV: 90.3 fL (ref 79.5–101.0)
MONO#: 0.4 10*3/uL (ref 0.1–0.9)
MONO%: 8 % (ref 0.0–14.0)
NEUT#: 2.6 10*3/uL (ref 1.5–6.5)
NEUT%: 51 % (ref 38.4–76.8)
NRBC: 0 % (ref 0–0)
RBC: 3.18 10*6/uL — ABNORMAL LOW (ref 3.70–5.45)
RDW: 16.1 % — AB (ref 11.2–14.5)
WBC: 5.1 10*3/uL (ref 3.9–10.3)
lymph#: 2 10*3/uL (ref 0.9–3.3)

## 2014-04-22 MED ORDER — OXYCODONE-ACETAMINOPHEN 5-325 MG PO TABS: 1.0000 | ORAL_TABLET | ORAL | Status: DC | PRN

## 2014-04-22 NOTE — Telephone Encounter (Signed)
LMOVM - Prescription ready for pickup.  Prescription placed in book.

## 2014-04-28 ENCOUNTER — Other Ambulatory Visit: Payer: Self-pay | Admitting: *Deleted

## 2014-04-28 DIAGNOSIS — C50411 Malignant neoplasm of upper-outer quadrant of right female breast: Secondary | ICD-10-CM

## 2014-04-29 ENCOUNTER — Ambulatory Visit (HOSPITAL_BASED_OUTPATIENT_CLINIC_OR_DEPARTMENT_OTHER): Payer: Medicaid Other | Admitting: Hematology and Oncology

## 2014-04-29 ENCOUNTER — Telehealth: Payer: Self-pay | Admitting: *Deleted

## 2014-04-29 ENCOUNTER — Ambulatory Visit (HOSPITAL_BASED_OUTPATIENT_CLINIC_OR_DEPARTMENT_OTHER): Payer: Medicaid Other

## 2014-04-29 ENCOUNTER — Other Ambulatory Visit: Payer: Self-pay | Admitting: Hematology and Oncology

## 2014-04-29 ENCOUNTER — Telehealth: Payer: Self-pay | Admitting: Hematology and Oncology

## 2014-04-29 ENCOUNTER — Other Ambulatory Visit (HOSPITAL_BASED_OUTPATIENT_CLINIC_OR_DEPARTMENT_OTHER): Payer: Medicaid Other

## 2014-04-29 VITALS — BP 107/70 | HR 75 | Temp 98.3°F | Resp 18 | Ht 64.0 in | Wt 152.5 lb

## 2014-04-29 DIAGNOSIS — C50411 Malignant neoplasm of upper-outer quadrant of right female breast: Secondary | ICD-10-CM

## 2014-04-29 DIAGNOSIS — Z5112 Encounter for antineoplastic immunotherapy: Secondary | ICD-10-CM

## 2014-04-29 DIAGNOSIS — Z170 Estrogen receptor positive status [ER+]: Secondary | ICD-10-CM

## 2014-04-29 LAB — COMPREHENSIVE METABOLIC PANEL (CC13)
ALT: 8 U/L (ref 0–55)
ANION GAP: 14 meq/L — AB (ref 3–11)
AST: 15 U/L (ref 5–34)
Albumin: 3.5 g/dL (ref 3.5–5.0)
Alkaline Phosphatase: 167 U/L — ABNORMAL HIGH (ref 40–150)
BUN: <3 mg/dL — ABNORMAL LOW (ref 7.0–26.0)
CO2: 22 meq/L (ref 22–29)
Calcium: 7.6 mg/dL — ABNORMAL LOW (ref 8.4–10.4)
Chloride: 102 mEq/L (ref 98–109)
Creatinine: 0.9 mg/dL (ref 0.6–1.1)
EGFR: 67 mL/min/{1.73_m2} — ABNORMAL LOW (ref >90)
GLUCOSE: 110 mg/dL (ref 70–140)
Potassium: 3.4 mEq/L — ABNORMAL LOW (ref 3.5–5.1)
SODIUM: 138 meq/L (ref 136–145)
TOTAL PROTEIN: 6.6 g/dL (ref 6.4–8.3)
Total Bilirubin: 0.52 mg/dL (ref 0.20–1.20)

## 2014-04-29 LAB — CBC WITH DIFFERENTIAL/PLATELET
BASO%: 0 % (ref 0.0–2.0)
BASOS ABS: 0 10*3/uL (ref 0.0–0.1)
EOS ABS: 0 10*3/uL (ref 0.0–0.5)
EOS%: 1 % (ref 0.0–7.0)
HCT: 27.9 % — ABNORMAL LOW (ref 34.8–46.6)
HEMOGLOBIN: 9.4 g/dL — AB (ref 11.6–15.9)
LYMPH%: 40.5 % (ref 14.0–49.7)
MCH: 30.5 pg (ref 25.1–34.0)
MCHC: 33.7 g/dL (ref 31.5–36.0)
MCV: 90.6 fL (ref 79.5–101.0)
MONO#: 0.2 10*3/uL (ref 0.1–0.9)
MONO%: 3.9 % (ref 0.0–14.0)
NEUT#: 2.3 10*3/uL (ref 1.5–6.5)
NEUT%: 54.6 % (ref 38.4–76.8)
Platelets: 44 10*3/uL — ABNORMAL LOW (ref 145–400)
RBC: 3.08 10*6/uL — ABNORMAL LOW (ref 3.70–5.45)
RDW: 17.3 % — ABNORMAL HIGH (ref 11.2–14.5)
WBC: 4.1 10*3/uL (ref 3.9–10.3)
lymph#: 1.7 10*3/uL (ref 0.9–3.3)
nRBC: 0 % (ref 0–0)

## 2014-04-29 MED ORDER — HEPARIN SOD (PORK) LOCK FLUSH 100 UNIT/ML IV SOLN
500.0000 [IU] | Freq: Once | INTRAVENOUS | Status: AC | PRN
  Administered 2014-04-29: 500 [IU]
  Filled 2014-04-29: qty 5

## 2014-04-29 MED ORDER — DIPHENHYDRAMINE HCL 25 MG PO CAPS
ORAL_CAPSULE | ORAL | Status: AC
  Filled 2014-04-29: qty 1

## 2014-04-29 MED ORDER — DIPHENHYDRAMINE HCL 25 MG PO CAPS
50.0000 mg | ORAL_CAPSULE | Freq: Once | ORAL | Status: AC
  Administered 2014-04-29: 50 mg via ORAL

## 2014-04-29 MED ORDER — TRASTUZUMAB CHEMO INJECTION 440 MG
6.0000 mg/kg | Freq: Once | INTRAVENOUS | Status: AC
  Administered 2014-04-29: 420 mg via INTRAVENOUS
  Filled 2014-04-29: qty 20

## 2014-04-29 MED ORDER — SODIUM CHLORIDE 0.9 % IJ SOLN
10.0000 mL | INTRAMUSCULAR | Status: DC | PRN
Start: 2014-04-29 — End: 2014-04-29
  Administered 2014-04-29: 10 mL
  Filled 2014-04-29: qty 10

## 2014-04-29 MED ORDER — SODIUM CHLORIDE 0.9 % IV SOLN
Freq: Once | INTRAVENOUS | Status: AC
  Administered 2014-04-29: 12:00:00 via INTRAVENOUS

## 2014-04-29 MED ORDER — FENTANYL 25 MCG/HR TD PT72: 25.0000 ug | MEDICATED_PATCH | TRANSDERMAL | Status: DC

## 2014-04-29 MED ORDER — SODIUM CHLORIDE 0.9 % IV SOLN
420.0000 mg | Freq: Once | INTRAVENOUS | Status: AC
  Administered 2014-04-29: 420 mg via INTRAVENOUS
  Filled 2014-04-29: qty 14

## 2014-04-29 MED ORDER — OXYCODONE-ACETAMINOPHEN 5-325 MG PO TABS: 1.0000 | ORAL_TABLET | ORAL | Status: DC | PRN

## 2014-04-29 MED ORDER — ACETAMINOPHEN 325 MG PO TABS
650.0000 mg | ORAL_TABLET | Freq: Once | ORAL | Status: AC
  Administered 2014-04-29: 650 mg via ORAL

## 2014-04-29 MED ORDER — ACETAMINOPHEN 325 MG PO TABS
ORAL_TABLET | ORAL | Status: AC
  Filled 2014-04-29: qty 2

## 2014-04-29 NOTE — Telephone Encounter (Signed)
Per staff message and POF I have scheduled appts. Advised scheduler of appts. JMW  

## 2014-04-29 NOTE — Telephone Encounter (Signed)
, °

## 2014-04-29 NOTE — Progress Notes (Signed)
Patient Care Team: Darreld Mclean, MD as PCP - General (Family Medicine) Rulon Eisenmenger, MD as Consulting Physician (Hematology and Oncology) [name], MD as Consulting Physician (General Surgery) Thea Silversmith, MD as Consulting Physician (Radiation Oncology)  DIAGNOSIS: Breast cancer of upper-outer quadrant of right female breast   Staging form: Breast, AJCC 7th Edition     Clinical: Stage IIA (T2, N0, cM0) - Unsigned       Staging comments: Staged at breast conference 12/29/13.      Pathologic: No stage assigned - Unsigned   SUMMARY OF ONCOLOGIC HISTORY:   Breast cancer of upper-outer quadrant of right female breast   12/16/2013 Mammogram 1. A palpable 4.1 cm diameter mass/asymmetry with associated coarse heterogeneous microcalcifications at 11 o'clock 2 cm from the right nipple demonstrates mammographic and sonographic features highly suggestive of malignancy.    12/16/2013 Initial Biopsy Breast cancer of upper-outer quadrant of right female breast; Invasive Ductal Cancer Er 100%, PR: 0%; Her2 Positive Ratio 4.28: Ki 67:24%;    12/23/2013 Breast MRI Right Breast 4.4 x 3 x 2.5 cm and a sub cm satellite lesion. No LN   01/28/2014 -  Neo-Adjuvant Chemotherapy TC HP x2 cycles ( developed hand-foot syndrome) GC HP x1 cycle (developed dehydration and renal failure) CHP from 04/08/2014    CHIEF COMPLIANT: Poor appetite, diarrhea, diffuse body aches and pains, fatigue, nausea declining performance status  INTERVAL HISTORY: [name] is a 58 year old Caucasian female with above-mentioned history of right-sided breast cancer being treated with neoadjuvant chemotherapy with Herceptin and Perjeta. Treatment had to be changed multiple times because of intolerance to treatment. Today she comes in complaining of severe fatigue and diminished appetite and getting weaker. She has nausea all morning. Her performance status is rapidly declining. She has diffuse body aches and pains for which he takes  Percocets. She is not having to take about 6 tablets per day. Denies any fevers or chills.  REVIEW OF SYSTEMS:   Constitutional: Denies fevers, chills or abnormal weight loss Eyes: Denies blurriness of vision Ears, nose, mouth, throat, and face: Denies mucositis or sore throat Respiratory: Denies cough, dyspnea or wheezes Cardiovascular: Denies palpitation, chest discomfort or lower extremity swelling Gastrointestinal:  Denies nausea, heartburn or change in bowel habits Skin: Denies abnormal skin rashes Lymphatics: Denies new lymphadenopathy or easy bruising Neurological:Denies numbness, tingling or new weaknesses Behavioral/Psych: Mood is stable, no new changes  All other systems were reviewed with the patient and are negative.  I have reviewed the past medical history, past surgical history, social history and family history with the patient and they are unchanged from previous note.  ALLERGIES:  is allergic to codeine.  MEDICATIONS:  Current Outpatient Prescriptions  Medication Sig Dispense Refill  . ALPRAZolam (XANAX) 0.5 MG tablet TAKE 1 TABLET 3 TIMES A DAY AS NEEDED 90 tablet 0  . Alum & Mag Hydroxide-Simeth (MAGIC MOUTHWASH W/LIDOCAINE) SOLN Take 5 mLs by mouth 4 (four) times daily as needed for mouth pain (duke's formula with nystatin and lidocaine; all in 1:1 combo please.). 240 mL 1  . atenolol (TENORMIN) 50 MG tablet Take 50 mg by mouth 2 (two) times daily.    Marland Kitchen dexamethasone (DECADRON) 4 MG tablet   1  . doxycycline (VIBRA-TABS) 100 MG tablet   0  . fluconazole (DIFLUCAN) 100 MG tablet Take 1 tablet (100 mg total) by mouth daily. 7 tablet 0  . gabapentin (NEURONTIN) 100 MG capsule Take 1 capsule (100 mg total) by mouth 3 (three)  times daily. 90 capsule 3  . levofloxacin (LEVAQUIN) 500 MG tablet   0  . lidocaine-prilocaine (EMLA) cream Apply 1 application topically as needed (port a cath site). Apply to [facility] a cath site 2 hours prior to chemotherapy appointment    .  LORazepam (ATIVAN) 0.5 MG tablet Take 2 tablets (1 mg total) by mouth at bedtime. 30 tablet 0  . naproxen sodium (ANAPROX) 220 MG tablet Take 440-660 mg by mouth 2 (two) times daily.     Marland Kitchen nystatin (MYCOSTATIN/NYSTOP) 100000 UNIT/GM POWD Apply to the affected area daily until resolved. 60 g 1  . omeprazole (PRILOSEC) 20 MG capsule Take 20 mg by mouth every evening.     . ondansetron (ZOFRAN) 8 MG tablet Take 1 tablet (8 mg total) by mouth every 8 (eight) hours as needed for nausea or vomiting (nausea). 30 tablet 1  . oxyCODONE-acetaminophen (ROXICET) 5-325 MG per tablet Take 1-2 tablets by mouth every 4 (four) hours as needed for severe pain (pain). 90 tablet 0  . potassium chloride SA (K-DUR,KLOR-CON) 20 MEQ tablet Take 1 tablet (20 mEq total) by mouth 2 (two) times daily. 14 tablet 0  . prochlorperazine (COMPAZINE) 10 MG tablet Take 1 tablet (10 mg total) by mouth every 6 (six) hours as needed (Nausea or vomiting). 30 tablet 1  . sertraline (ZOLOFT) 50 MG tablet Take 1 tablet (50 mg total) by mouth at bedtime. 90 tablet 3  . triamcinolone cream (KENALOG) 0.1 %   0  . fentaNYL (DURAGESIC - DOSED MCG/HR) 25 MCG/HR patch Place 1 patch (25 mcg total) onto the skin every 3 (three) days. 10 patch 0   No current facility-administered medications for this visit.    PHYSICAL EXAMINATION: ECOG PERFORMANCE STATUS: 2 - Symptomatic, <50% confined to bed  Filed Vitals:   04/29/14 0946  BP: 107/70  Pulse: 75  Temp: 98.3 F (36.8 C)  Resp: 18   Filed Weights   04/29/14 0946  Weight: 152 lb 8 oz (69.174 kg)    GENERAL:alert, no distress and comfortable SKIN: skin color, texture, turgor are normal, no rashes or significant lesions EYES: normal, Conjunctiva are pink and non-injected, sclera clear OROPHARYNX:no exudate, no erythema and lips, buccal mucosa, and tongue normal  NECK: supple, thyroid normal size, non-tender, without nodularity LYMPH:  no palpable lymphadenopathy in the cervical,  axillary or inguinal LUNGS: clear to auscultation and percussion with normal breathing effort HEART: regular rate & rhythm and no murmurs and no lower extremity edema ABDOMEN:abdomen soft, non-tender and normal bowel sounds Musculoskeletal:no cyanosis of digits and no clubbing  NEURO: alert & oriented x 3 with fluent speech, no focal motor/sensory deficits  LABORATORY DATA:  I have reviewed the data as listed   Chemistry      Component Value Date/Time   NA 138 04/29/2014 0913   NA 130* 02/26/2014 1238   K 3.4* 04/29/2014 0913   K 4.0 02/26/2014 1238   CL 96 02/26/2014 1238   CO2 22 04/29/2014 0913   CO2 20 02/26/2014 1238   BUN <3.0* 04/29/2014 0913   BUN 7 02/26/2014 1238   CREATININE 0.9 04/29/2014 0913   CREATININE 0.59 02/26/2014 1238   CREATININE 0.76 04/03/2013 1420      Component Value Date/Time   CALCIUM 7.6* 04/29/2014 0913   CALCIUM 8.6 02/26/2014 1238   ALKPHOS 167* 04/29/2014 0913   ALKPHOS 151* 02/26/2014 1238   AST 15 04/29/2014 0913   AST 25 02/26/2014 1238   ALT 8 04/29/2014 0913  ALT 38* 02/26/2014 1238   BILITOT 0.52 04/29/2014 0913   BILITOT 0.7 02/26/2014 1238       Lab Results  Component Value Date   WBC 4.1 04/29/2014   HGB 9.4* 04/29/2014   HCT 27.9* 04/29/2014   MCV 90.6 04/29/2014   PLT 44* 04/29/2014   NEUTROABS 2.3 04/29/2014    ASSESSMENT & PLAN:  Breast cancer of upper-outer quadrant of right female breast Right breast invasive ductal carcinoma 4.4 cm in size with a satellite lesion based on MRI result there were no abnormal lymph nodes ER 100% positive PR 0% HER-2/neu positive (HER-2/CEP 17 ratio 4.28) grade 3 T2, N0, M0 stage II a.  Chemotherapy:  TC HP x2 cycles ( developed hand-foot syndrome) GC HP x1 cycle (developed dehydration and renal failure) CHP from 04/08/2014 (bone pain and Nausea and fatigue and poor nutrition) HP from 04/29/14  Chemotherapy related toxicities: 1. Hand-foot syndrome related to Taxotere: resulted  in discontinuation of Taxotere 2. Severe dehydration renal failure and intolerance to gemcitabine, resulted in discontinuation of gemcitabine 3. Continued dehydration even after discontinuing gemcitabine frequent IV fluids and potassium 4. Hypokalemia: Related to dehydration 5. Chemotherapy induced nausea: stopping carboplatin 6. Perjeta induced diarrhea ( alternating with constipation)  Plan: D/C carboplatin, continue herceptin perjeta for 2 cycles Return in 3 weeks after Christmas for the last treatment of Herceptin Perjeta       Orders Placed This Encounter  Procedures  . CBC with Differential    Standing Status: Future     Number of Occurrences:      Standing Expiration Date: 04/29/2015  . Comprehensive metabolic panel (Cmet) - [facility]    Standing Status: Future     Number of Occurrences:      Standing Expiration Date: 04/29/2015   The patient has a good understanding of the overall plan. she agrees with it. She will call with any problems that may develop before her next visit here.   Rulon Eisenmenger, MD 04/29/2014 10:40 AM

## 2014-04-29 NOTE — Patient Instructions (Signed)
[facility] Discharge Instructions for Patients Receiving Chemotherapy  Today you received the following chemotherapy agents Herceptin and Perjeta.  To help prevent nausea and vomiting after your treatment, we encourage you to take your nausea medication as prescribed.   If you develop nausea and vomiting that is not controlled by your nausea medication, call the clinic.   BELOW ARE SYMPTOMS THAT SHOULD BE REPORTED IMMEDIATELY:  *FEVER GREATER THAN 100.5 F  *CHILLS WITH OR WITHOUT FEVER  NAUSEA AND VOMITING THAT IS NOT CONTROLLED WITH YOUR NAUSEA MEDICATION  *UNUSUAL SHORTNESS OF BREATH  *UNUSUAL BRUISING OR BLEEDING  TENDERNESS IN MOUTH AND THROAT WITH OR WITHOUT PRESENCE OF ULCERS  *URINARY PROBLEMS  *BOWEL PROBLEMS  UNUSUAL RASH Items with * indicate a potential emergency and should be followed up as soon as possible.  Feel free to call the clinic you have any questions or concerns. The clinic phone number is [phone].    

## 2014-04-29 NOTE — Assessment & Plan Note (Addendum)
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  Chemotherapy:  TC HP x2 cycles ( developed hand-foot syndrome) GC HP x1 cycle (developed dehydration and renal failure) CHP from 04/08/2014 (bone pain and Nausea and fatigue and poor nutrition) HP from 04/29/14  Chemotherapy related toxicities: 1. 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 Severe dehydration renal failure and intolerance to gemcitabine, resulted in discontinuation of gemcitabine 3. Continued dehydration even after discontinuing gemcitabine frequent IV fluids and potassium 4. Hypokalemia: Related to dehydration 5. Chemotherapy induced nausea: stopping carboplatin 6. Perjeta induced diarrhea ( alternating with constipation)  Plan: D/C carboplatin, continue herceptin perjeta for 2 cycles Return in 3 weeks

## 2014-04-30 ENCOUNTER — Ambulatory Visit: Payer: Medicaid Other

## 2014-05-06 ENCOUNTER — Telehealth: Payer: Self-pay

## 2014-05-06 ENCOUNTER — Other Ambulatory Visit: Payer: Self-pay | Admitting: Hematology and Oncology

## 2014-05-06 ENCOUNTER — Ambulatory Visit: Payer: Medicaid Other | Admitting: Hematology and Oncology

## 2014-05-06 ENCOUNTER — Other Ambulatory Visit: Payer: Medicaid Other

## 2014-05-06 NOTE — Telephone Encounter (Signed)
LM on patients home voice mail.  Received request for refill of zofran and compazine.  Pt. Had #30 with 1 refill on 04-05-14.  Last chemo - carboplatin was on 03-21-14.  Pt. Now on herceptin,perjeta only.  LM for patient to call us as we are concerned about the amount of medication she is having to take for nausea.  Notified Coral Ceo RN for Dr. Lindi Adie about request for rx and request for patient to call us for [place] to assess nausea relief from current medication regiment.

## 2014-05-06 NOTE — Telephone Encounter (Signed)
Note:  Patient's last carboplatin was on 04-08-14 (not 03-21-14)

## 2014-05-06 NOTE — Telephone Encounter (Signed)
Request for refill on nausea meds rcvd by triage and on desk nurse msg.  Called pt back and LMOVM for pt to call.  Need clarification on anti-emetic as patient is no longer on chemotherapy, monoclonals only.

## 2014-05-09 ENCOUNTER — Other Ambulatory Visit: Payer: Self-pay | Admitting: *Deleted

## 2014-05-09 DIAGNOSIS — C50411 Malignant neoplasm of upper-outer quadrant of right female breast: Secondary | ICD-10-CM

## 2014-05-09 MED ORDER — PROCHLORPERAZINE MALEATE 10 MG PO TABS: 10.0000 mg | ORAL_TABLET | Freq: Four times a day (QID) | ORAL | Status: DC | PRN

## 2014-05-09 MED ORDER — ONDANSETRON HCL 8 MG PO TABS: 8.0000 mg | ORAL_TABLET | Freq: Three times a day (TID) | ORAL | Status: DC | PRN

## 2014-05-09 NOTE — Telephone Encounter (Signed)
Only on Herceptin & Perjeta now. Received refill request from [facility] Discussed w/MD-incidence of nausea with single agent Perjeta is up to 24%. OK to refill per Dr. Lindi Adie.

## 2014-05-10 ENCOUNTER — Other Ambulatory Visit: Payer: Self-pay | Admitting: Family Medicine

## 2014-05-12 NOTE — Telephone Encounter (Signed)
Please refill  This for 6 months

## 2014-05-14 ENCOUNTER — Telehealth: Payer: Self-pay

## 2014-05-14 MED ORDER — ALPRAZOLAM 0.5 MG PO TABS: ORAL_TABLET | ORAL | Status: DC

## 2014-05-14 NOTE — Telephone Encounter (Signed)
The patient states that she called in her Rx refill request for Xanax to [facility] on Monday 05/09/14 and has not had a response from our office.  The patient requests refill and return call to verify Rx has been called in to pharmacy. Please call patient at [phone]

## 2014-05-14 NOTE — Telephone Encounter (Signed)
Dr. Loreen Freud for 6 months rx called into pharmacy

## 2014-05-16 ENCOUNTER — Other Ambulatory Visit: Payer: Self-pay

## 2014-05-16 DIAGNOSIS — C50411 Malignant neoplasm of upper-outer quadrant of right female breast: Secondary | ICD-10-CM

## 2014-05-16 MED ORDER — OXYCODONE-ACETAMINOPHEN 5-325 MG PO TABS
1.0000 | ORAL_TABLET | ORAL | Status: DC | PRN
Start: 2014-05-16 — End: 2014-05-27

## 2014-05-20 DIAGNOSIS — C801 Malignant (primary) neoplasm, unspecified: Secondary | ICD-10-CM

## 2014-05-20 HISTORY — DX: Malignant (primary) neoplasm, unspecified: C80.1

## 2014-05-20 HISTORY — PX: MASTECTOMY: SHX3

## 2014-05-25 ENCOUNTER — Encounter: Payer: Self-pay | Admitting: Hematology and Oncology

## 2014-05-25 NOTE — Progress Notes (Signed)
Patient needing asst for home bills. I advised her poss asst thru [employer]- she will call. I advised her also to call our billing to see if she can get financial asst.

## 2014-05-26 ENCOUNTER — Other Ambulatory Visit: Payer: Self-pay

## 2014-05-26 NOTE — Progress Notes (Signed)
ECHO not scheduled - amended order with expected date of 1/22.  Will continue to monitor.

## 2014-05-27 ENCOUNTER — Other Ambulatory Visit (HOSPITAL_BASED_OUTPATIENT_CLINIC_OR_DEPARTMENT_OTHER): Payer: Medicaid Other

## 2014-05-27 ENCOUNTER — Telehealth: Payer: Self-pay | Admitting: Hematology and Oncology

## 2014-05-27 ENCOUNTER — Ambulatory Visit (HOSPITAL_BASED_OUTPATIENT_CLINIC_OR_DEPARTMENT_OTHER): Payer: Medicaid Other | Admitting: Hematology and Oncology

## 2014-05-27 ENCOUNTER — Ambulatory Visit (HOSPITAL_BASED_OUTPATIENT_CLINIC_OR_DEPARTMENT_OTHER): Payer: Medicaid Other

## 2014-05-27 ENCOUNTER — Other Ambulatory Visit: Payer: Self-pay | Admitting: *Deleted

## 2014-05-27 ENCOUNTER — Encounter: Payer: Self-pay | Admitting: *Deleted

## 2014-05-27 VITALS — BP 110/73 | HR 72 | Temp 98.3°F | Resp 18 | Ht 64.0 in | Wt 146.5 lb

## 2014-05-27 DIAGNOSIS — Z5112 Encounter for antineoplastic immunotherapy: Secondary | ICD-10-CM

## 2014-05-27 DIAGNOSIS — C50411 Malignant neoplasm of upper-outer quadrant of right female breast: Secondary | ICD-10-CM

## 2014-05-27 DIAGNOSIS — E860 Dehydration: Secondary | ICD-10-CM

## 2014-05-27 DIAGNOSIS — E876 Hypokalemia: Secondary | ICD-10-CM

## 2014-05-27 DIAGNOSIS — Z170 Estrogen receptor positive status [ER+]: Secondary | ICD-10-CM

## 2014-05-27 LAB — CBC WITH DIFFERENTIAL/PLATELET
BASO%: 0.5 % (ref 0.0–2.0)
Basophils Absolute: 0.1 10*3/uL (ref 0.0–0.1)
EOS ABS: 0.1 10*3/uL (ref 0.0–0.5)
EOS%: 0.5 % (ref 0.0–7.0)
HCT: 32.6 % — ABNORMAL LOW (ref 34.8–46.6)
HEMOGLOBIN: 10.4 g/dL — AB (ref 11.6–15.9)
LYMPH%: 17.7 % (ref 14.0–49.7)
MCH: 30.5 pg (ref 25.1–34.0)
MCHC: 32 g/dL (ref 31.5–36.0)
MCV: 95.4 fL (ref 79.5–101.0)
MONO#: 0.7 10*3/uL (ref 0.1–0.9)
MONO%: 5.3 % (ref 0.0–14.0)
NEUT#: 10.2 10*3/uL — ABNORMAL HIGH (ref 1.5–6.5)
NEUT%: 76 % (ref 38.4–76.8)
Platelets: 312 10*3/uL (ref 145–400)
RBC: 3.42 10*6/uL — ABNORMAL LOW (ref 3.70–5.45)
RDW: 17 % — ABNORMAL HIGH (ref 11.2–14.5)
WBC: 13.5 10*3/uL — ABNORMAL HIGH (ref 3.9–10.3)
lymph#: 2.4 10*3/uL (ref 0.9–3.3)

## 2014-05-27 LAB — COMPREHENSIVE METABOLIC PANEL (CC13)
ALBUMIN: 2.9 g/dL — AB (ref 3.5–5.0)
ALT: 8 U/L (ref 0–55)
ANION GAP: 9 meq/L (ref 3–11)
AST: 21 U/L (ref 5–34)
Alkaline Phosphatase: 174 U/L — ABNORMAL HIGH (ref 40–150)
BILIRUBIN TOTAL: 0.35 mg/dL (ref 0.20–1.20)
BUN: 3.4 mg/dL — AB (ref 7.0–26.0)
CALCIUM: 8.2 mg/dL — AB (ref 8.4–10.4)
CO2: 27 mEq/L (ref 22–29)
Chloride: 101 mEq/L (ref 98–109)
Creatinine: 0.7 mg/dL (ref 0.6–1.1)
GLUCOSE: 101 mg/dL (ref 70–140)
Potassium: 2.8 mEq/L — CL (ref 3.5–5.1)
Sodium: 138 mEq/L (ref 136–145)
Total Protein: 6.2 g/dL — ABNORMAL LOW (ref 6.4–8.3)

## 2014-05-27 MED ORDER — OXYCODONE-ACETAMINOPHEN 5-325 MG PO TABS: 1.0000 | ORAL_TABLET | ORAL | Status: DC | PRN

## 2014-05-27 MED ORDER — DIPHENHYDRAMINE HCL 25 MG PO CAPS
ORAL_CAPSULE | ORAL | Status: AC
  Filled 2014-05-27: qty 2

## 2014-05-27 MED ORDER — ACETAMINOPHEN 325 MG PO TABS
650.0000 mg | ORAL_TABLET | Freq: Once | ORAL | Status: AC
  Administered 2014-05-27: 650 mg via ORAL

## 2014-05-27 MED ORDER — POTASSIUM CHLORIDE CRYS ER 20 MEQ PO TBCR: 20.0000 meq | EXTENDED_RELEASE_TABLET | Freq: Two times a day (BID) | ORAL | Status: DC

## 2014-05-27 MED ORDER — SODIUM CHLORIDE 0.9 % IV SOLN
6.0000 mg/kg | Freq: Once | INTRAVENOUS | Status: AC
  Administered 2014-05-27: 420 mg via INTRAVENOUS
  Filled 2014-05-27: qty 20

## 2014-05-27 MED ORDER — SODIUM CHLORIDE 0.9 % IJ SOLN
10.0000 mL | INTRAMUSCULAR | Status: DC | PRN
Start: 2014-05-27 — End: 2014-05-27
  Administered 2014-05-27: 10 mL
  Filled 2014-05-27: qty 10

## 2014-05-27 MED ORDER — HEPARIN SOD (PORK) LOCK FLUSH 100 UNIT/ML IV SOLN
500.0000 [IU] | Freq: Once | INTRAVENOUS | Status: AC | PRN
  Administered 2014-05-27: 500 [IU]
  Filled 2014-05-27: qty 5

## 2014-05-27 MED ORDER — PERTUZUMAB CHEMO INJECTION 420 MG/14ML
420.0000 mg | Freq: Once | INTRAVENOUS | Status: AC
  Administered 2014-05-27: 420 mg via INTRAVENOUS
  Filled 2014-05-27: qty 14

## 2014-05-27 MED ORDER — SODIUM CHLORIDE 0.9 % IV SOLN
Freq: Once | INTRAVENOUS | Status: AC
  Administered 2014-05-27: 11:00:00 via INTRAVENOUS

## 2014-05-27 MED ORDER — DIPHENHYDRAMINE HCL 25 MG PO CAPS
50.0000 mg | ORAL_CAPSULE | Freq: Once | ORAL | Status: AC
  Administered 2014-05-27: 50 mg via ORAL

## 2014-05-27 MED ORDER — ACETAMINOPHEN 325 MG PO TABS
ORAL_TABLET | ORAL | Status: AC
  Filled 2014-05-27: qty 2

## 2014-05-27 NOTE — Telephone Encounter (Signed)
error 

## 2014-05-27 NOTE — Assessment & Plan Note (Signed)
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  Chemotherapy:  TC HP x2 cycles ( developed hand-foot syndrome) GC HP x1 cycle (developed dehydration and renal failure) CHP 1 cycle 04/08/2014 (bone pain and Nausea and fatigue and poor nutrition) HP 2 cycles 04/29/14 and 05/27/2014 Today's last cycle of neoadjuvant treatment  Chemotherapy related toxicities: 1. 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 Severe dehydration renal failure and intolerance to gemcitabine, resulted in discontinuation of gemcitabine 3. Continued dehydration even after discontinuing gemcitabine frequent IV fluids and potassium 4. Hypokalemia: Related to dehydration 5. Chemotherapy induced nausea: Improved with stopping carboplatin 6. Perjeta induced diarrhea (alternating with constipation)  Plan: MRI of the breast followed by tumor board presentation and surgery consultation Return to clinic after breast MRI and tumor board discussion.

## 2014-05-27 NOTE — Patient Instructions (Signed)
[facility] Discharge Instructions for Patients Receiving Chemotherapy  Today you received the following chemotherapy agents herceptin/perjeta  To help prevent nausea and vomiting after your treatment, we encourage you to take your nausea medication as directed   If you develop nausea and vomiting that is not controlled by your nausea medication, call the clinic.   BELOW ARE SYMPTOMS THAT SHOULD BE REPORTED IMMEDIATELY:  *FEVER GREATER THAN 100.5 F  *CHILLS WITH OR WITHOUT FEVER  NAUSEA AND VOMITING THAT IS NOT CONTROLLED WITH YOUR NAUSEA MEDICATION  *UNUSUAL SHORTNESS OF BREATH  *UNUSUAL BRUISING OR BLEEDING  TENDERNESS IN MOUTH AND THROAT WITH OR WITHOUT PRESENCE OF ULCERS  *URINARY PROBLEMS  *BOWEL PROBLEMS  UNUSUAL RASH Items with * indicate a potential emergency and should be followed up as soon as possible.  Feel free to call the clinic you have any questions or concerns. The clinic phone number is [phone].  

## 2014-05-27 NOTE — Progress Notes (Signed)
Patient Care Team: Darreld Mclean, MD as PCP - General (Family Medicine) Rulon Eisenmenger, MD as Consulting Physician (Hematology and Oncology) [name], MD as Consulting Physician (General Surgery) Thea Silversmith, MD as Consulting Physician (Radiation Oncology)  DIAGNOSIS: Breast cancer of upper-outer quadrant of right female breast   Staging form: Breast, AJCC 7th Edition     Clinical: Stage IIA (T2, N0, cM0) - Unsigned       Staging comments: Staged at breast conference 12/29/13.      Pathologic: No stage assigned - Unsigned   SUMMARY OF ONCOLOGIC HISTORY:   Breast cancer of upper-outer quadrant of right female breast   12/16/2013 Mammogram 1. A palpable 4.1 cm diameter mass/asymmetry with associated coarse heterogeneous microcalcifications at 11 o'clock 2 cm from the right nipple demonstrates mammographic and sonographic features highly suggestive of malignancy.    12/16/2013 Initial Biopsy Breast cancer of upper-outer quadrant of right female breast; Invasive Ductal Cancer Er 100%, PR: 0%; Her2 Positive Ratio 4.28: Ki 67:24%;    12/23/2013 Breast MRI Right Breast 4.4 x 3 x 2.5 cm and a sub cm satellite lesion. No LN   01/28/2014 - 05/27/2014 Neo-Adjuvant Chemotherapy TC HP x2 cycles ( developed hand-foot syndrome) GC HP x1 cycle (developed dehydration and renal failure) CHP from 04/08/2014    CHIEF COMPLIANT: Herceptin Perjeta today last treatment  INTERVAL HISTORY: [name] is a 59 year old lady with above-mentioned history of right-sided breast cancer who is here to finish the last treatment with Herceptin Perjeta. She could not tolerate chemotherapy and had to be stopped after 4 cycles. She is doing much better since the chemotherapy has been stopped. Her taste is coming back appetite is coming back she is eating better she continues to have intermittent diarrhea followed by constipation. I suspect the diarrhea may be Perjeta related and the constipation may be related to  oxycodone. She continues to have knee pain for which she takes pain medications. Denies any fevers or chills.  REVIEW OF SYSTEMS:   Constitutional: Denies fevers, chills or abnormal weight loss Eyes: Denies blurriness of vision Ears, nose, mouth, throat, and face: Denies mucositis or sore throat Respiratory: Denies cough, dyspnea or wheezes Cardiovascular: Denies palpitation, chest discomfort or lower extremity swelling Gastrointestinal:  Denies nausea, heartburn or change in bowel habits Skin: Denies abnormal skin rashes Lymphatics: Denies new lymphadenopathy or easy bruising Neurological:Denies numbness, tingling or new weaknesses Behavioral/Psych: Mood is stable, no new changes  All other systems were reviewed with the patient and are negative.  I have reviewed the past medical history, past surgical history, social history and family history with the patient and they are unchanged from previous note.  ALLERGIES:  is allergic to codeine.  MEDICATIONS:  Current Outpatient Prescriptions  Medication Sig Dispense Refill  . ALPRAZolam (XANAX) 0.5 MG tablet TAKE 1 TABLET 3 TIMES A DAY AS NEEDED 90 tablet 5  . Alum & Mag Hydroxide-Simeth (MAGIC MOUTHWASH W/LIDOCAINE) SOLN Take 5 mLs by mouth 4 (four) times daily as needed for mouth pain (duke's formula with nystatin and lidocaine; all in 1:1 combo please.). 240 mL 1  . atenolol (TENORMIN) 50 MG tablet Take 50 mg by mouth 2 (two) times daily.    Marland Kitchen dexamethasone (DECADRON) 4 MG tablet   1  . lidocaine-prilocaine (EMLA) cream Apply 1 application topically as needed (port a cath site). Apply to [facility] a cath site 2 hours prior to chemotherapy appointment    . LORazepam (ATIVAN) 0.5 MG tablet Take 2 tablets (  1 mg total) by mouth at bedtime. 30 tablet 0  . nystatin (MYCOSTATIN/NYSTOP) 100000 UNIT/GM POWD Apply to the affected area daily until resolved. 60 g 1  . omeprazole (PRILOSEC) 20 MG capsule Take 20 mg by mouth every evening.     .  ondansetron (ZOFRAN) 8 MG tablet Take 1 tablet (8 mg total) by mouth every 8 (eight) hours as needed for nausea or vomiting (nausea). 30 tablet 0  . oxyCODONE-acetaminophen (ROXICET) 5-325 MG per tablet Take 1-2 tablets by mouth every 4 (four) hours as needed for severe pain (pain). 90 tablet 0  . prochlorperazine (COMPAZINE) 10 MG tablet Take 1 tablet (10 mg total) by mouth every 6 (six) hours as needed (Nausea or vomiting). 30 tablet 0  . sertraline (ZOLOFT) 50 MG tablet Take 1 tablet (50 mg total) by mouth at bedtime. 90 tablet 3  . potassium chloride SA (K-DUR,KLOR-CON) 20 MEQ tablet Take 1 tablet (20 mEq total) by mouth 2 (two) times daily. 30 tablet 0   No current facility-administered medications for this visit.    PHYSICAL EXAMINATION: ECOG PERFORMANCE STATUS: 2 - Symptomatic, <50% confined to bed  Filed Vitals:   05/27/14 0915  BP: 110/73  Pulse: 72  Temp: 98.3 F (36.8 C)  Resp: 18   Filed Weights   05/27/14 0915  Weight: 146 lb 8 oz (66.452 kg)    GENERAL:alert, no distress and comfortable SKIN: skin color, texture, turgor are normal, no rashes or significant lesions EYES: normal, Conjunctiva are pink and non-injected, sclera clear OROPHARYNX:no exudate, no erythema and lips, buccal mucosa, and tongue normal  NECK: supple, thyroid normal size, non-tender, without nodularity LYMPH:  no palpable lymphadenopathy in the cervical, axillary or inguinal LUNGS: clear to auscultation and percussion with normal breathing effort HEART: regular rate & rhythm and no murmurs and no lower extremity edema ABDOMEN:abdomen soft, non-tender and normal bowel sounds Musculoskeletal:no cyanosis of digits and no clubbing  NEURO: alert & oriented x 3 with fluent speech, no focal motor/sensory deficits  LABORATORY DATA:  I have reviewed the data as listed   Chemistry      Component Value Date/Time   NA 138 05/27/2014 0902   NA 130* 02/26/2014 1238   K 2.8* 05/27/2014 0902   K 4.0  02/26/2014 1238   CL 96 02/26/2014 1238   CO2 27 05/27/2014 0902   CO2 20 02/26/2014 1238   BUN 3.4* 05/27/2014 0902   BUN 7 02/26/2014 1238   CREATININE 0.7 05/27/2014 0902   CREATININE 0.59 02/26/2014 1238   CREATININE 0.76 04/03/2013 1420      Component Value Date/Time   CALCIUM 8.2* 05/27/2014 0902   CALCIUM 8.6 02/26/2014 1238   ALKPHOS 174* 05/27/2014 0902   ALKPHOS 151* 02/26/2014 1238   AST 21 05/27/2014 0902   AST 25 02/26/2014 1238   ALT 8 05/27/2014 0902   ALT 38* 02/26/2014 1238   BILITOT 0.35 05/27/2014 0902   BILITOT 0.7 02/26/2014 1238       Lab Results  Component Value Date   WBC 13.5* 05/27/2014   HGB 10.4* 05/27/2014   HCT 32.6* 05/27/2014   MCV 95.4 05/27/2014   PLT 312 05/27/2014   NEUTROABS 10.2* 05/27/2014    ASSESSMENT & PLAN:  Breast cancer of upper-outer quadrant of right female breast 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  Chemotherapy:  TC HP x2 cycles ( developed hand-foot syndrome) GC HP x1 cycle (developed dehydration and renal failure) CHP 1 cycle 04/08/2014 (bone pain and Nausea and fatigue and poor nutrition) HP 2 cycles 04/29/14 and 05/27/2014 Today's last cycle of neoadjuvant treatment  Chemotherapy related toxicities: 1. 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 Severe dehydration renal failure and intolerance to gemcitabine, resulted in discontinuation of gemcitabine 3. Continued dehydration even after discontinuing gemcitabine frequent IV fluids and potassium 4. Hypokalemia: Related to dehydration, ordered but hasn't supplementation today for a potassium of 2.8 5. Chemotherapy induced nausea: Improved with stopping carboplatin 6. Perjeta induced diarrhea (alternating with constipation) I  renewed her oxycodone which she takes for her knee pain from rheumatoid arthritis  Plan: MRI of the breast followed by tumor board presentation and surgery consultation Return to clinic after breast MRI and tumor board discussion.   Orders Placed This Encounter  Procedures  . MR Breast Bilateral W Contrast    Standing Status: Future     Number of Occurrences:      Standing Expiration Date: 05/27/2015    Order Specific Question:  Reason for Exam (SYMPTOM  OR DIAGNOSIS REQUIRED)    Answer:  Completion of neoadjuvant chemo    Order Specific Question:  Preferred imaging location?    Answer:  [facility]    Order Specific Question:  Does the patient have a pacemaker or implanted devices?    Answer:  No    Order Specific Question:  What is the patient's sedation requirement?    Answer:  No Sedation  . CBC with Differential    Standing Status: Future     Number of Occurrences:      Standing Expiration Date: 05/27/2015  . Comprehensive metabolic panel (Cmet) - [facility]    Standing Status: Future     Number of Occurrences:      Standing Expiration Date: 05/27/2015   The patient has a good understanding of the overall plan. she agrees with it. She will call with any problems that may develop before her next visit here.   Rulon Eisenmenger, MD 05/27/2014 10:10 AM

## 2014-05-27 NOTE — Telephone Encounter (Signed)
, °

## 2014-05-27 NOTE — Progress Notes (Signed)
Patient refuses to remain for 30 minute monitor post perjeta despite education on safety precautions.

## 2014-05-27 NOTE — Progress Notes (Signed)
Pt refused to stay for post Prejeta Monitoring period of 30 minutes. Denies distress.

## 2014-05-31 ENCOUNTER — Telehealth: Payer: Self-pay

## 2014-05-31 ENCOUNTER — Other Ambulatory Visit: Payer: Self-pay | Admitting: Hematology and Oncology

## 2014-05-31 DIAGNOSIS — C50411 Malignant neoplasm of upper-outer quadrant of right female breast: Secondary | ICD-10-CM

## 2014-05-31 NOTE — Telephone Encounter (Signed)
LMOVM - unable to change mri appt with GI d/t machine being used.  Pt needs to keep appt she has.  Pt to call clinic if she needs to discuss further.

## 2014-06-01 ENCOUNTER — Ambulatory Visit (HOSPITAL_COMMUNITY): Admission: RE | Admit: 2014-06-01 | Payer: Medicaid Other | Source: Ambulatory Visit

## 2014-06-03 ENCOUNTER — Ambulatory Visit (HOSPITAL_COMMUNITY)
Admission: RE | Admit: 2014-06-03 | Discharge: 2014-06-03 | Disposition: A | Discharge status: Home / Self Care | Payer: Medicaid Other | Source: Ambulatory Visit | Attending: Hematology and Oncology | Admitting: Hematology and Oncology

## 2014-06-03 DIAGNOSIS — C50411 Malignant neoplasm of upper-outer quadrant of right female breast: Secondary | ICD-10-CM | POA: Insufficient documentation

## 2014-06-03 DIAGNOSIS — I517 Cardiomegaly: Secondary | ICD-10-CM

## 2014-06-03 NOTE — Progress Notes (Signed)
Echocardiogram 2D Echocardiogram has been performed.  [name] 06/03/2014, 11:57 AM

## 2014-06-06 ENCOUNTER — Ambulatory Visit
Admission: RE | Admit: 2014-06-06 | Discharge: 2014-06-06 | Disposition: A | Discharge status: Home / Self Care | Payer: Medicaid Other | Source: Ambulatory Visit | Attending: Hematology and Oncology | Admitting: Hematology and Oncology

## 2014-06-06 ENCOUNTER — Other Ambulatory Visit: Payer: Self-pay | Admitting: *Deleted

## 2014-06-06 ENCOUNTER — Other Ambulatory Visit: Payer: Medicaid Other

## 2014-06-06 DIAGNOSIS — C50411 Malignant neoplasm of upper-outer quadrant of right female breast: Secondary | ICD-10-CM

## 2014-06-06 MED ORDER — OXYCODONE-ACETAMINOPHEN 5-325 MG PO TABS: 1.0000 | ORAL_TABLET | ORAL | Status: DC | PRN

## 2014-06-06 MED ORDER — GADOBENATE DIMEGLUMINE 529 MG/ML IV SOLN
13.0000 mL | Freq: Once | INTRAVENOUS | Status: AC | PRN
  Administered 2014-06-06: 13 mL via INTRAVENOUS

## 2014-06-08 ENCOUNTER — Other Ambulatory Visit (HOSPITAL_BASED_OUTPATIENT_CLINIC_OR_DEPARTMENT_OTHER): Payer: Medicaid Other

## 2014-06-08 ENCOUNTER — Ambulatory Visit (HOSPITAL_BASED_OUTPATIENT_CLINIC_OR_DEPARTMENT_OTHER): Payer: Medicaid Other | Admitting: Hematology and Oncology

## 2014-06-08 VITALS — BP 118/70 | HR 78 | Temp 98.3°F | Resp 18 | Ht 64.0 in | Wt 143.1 lb

## 2014-06-08 DIAGNOSIS — C50811 Malignant neoplasm of overlapping sites of right female breast: Secondary | ICD-10-CM

## 2014-06-08 DIAGNOSIS — L271 Localized skin eruption due to drugs and medicaments taken internally: Secondary | ICD-10-CM

## 2014-06-08 DIAGNOSIS — C50411 Malignant neoplasm of upper-outer quadrant of right female breast: Secondary | ICD-10-CM

## 2014-06-08 DIAGNOSIS — R110 Nausea: Secondary | ICD-10-CM

## 2014-06-08 DIAGNOSIS — N19 Unspecified kidney failure: Secondary | ICD-10-CM

## 2014-06-08 DIAGNOSIS — E876 Hypokalemia: Secondary | ICD-10-CM

## 2014-06-08 DIAGNOSIS — E860 Dehydration: Secondary | ICD-10-CM

## 2014-06-08 LAB — COMPREHENSIVE METABOLIC PANEL (CC13)
ALT: 12 U/L (ref 0–55)
ANION GAP: 9 meq/L (ref 3–11)
AST: 19 U/L (ref 5–34)
Albumin: 2.9 g/dL — ABNORMAL LOW (ref 3.5–5.0)
Alkaline Phosphatase: 180 U/L — ABNORMAL HIGH (ref 40–150)
BUN: 6.6 mg/dL — ABNORMAL LOW (ref 7.0–26.0)
CALCIUM: 8 mg/dL — AB (ref 8.4–10.4)
CHLORIDE: 104 meq/L (ref 98–109)
CO2: 23 meq/L (ref 22–29)
Creatinine: 0.8 mg/dL (ref 0.6–1.1)
EGFR: 78 mL/min/{1.73_m2} — ABNORMAL LOW (ref >90)
Glucose: 99 mg/dl (ref 70–140)
Potassium: 3.5 mEq/L (ref 3.5–5.1)
SODIUM: 136 meq/L (ref 136–145)
Total Bilirubin: 0.23 mg/dL (ref 0.20–1.20)
Total Protein: 6.2 g/dL — ABNORMAL LOW (ref 6.4–8.3)

## 2014-06-08 LAB — CBC WITH DIFFERENTIAL/PLATELET
BASO%: 0.2 % (ref 0.0–2.0)
Basophils Absolute: 0 10*3/uL (ref 0.0–0.1)
EOS%: 1.5 % (ref 0.0–7.0)
Eosinophils Absolute: 0.2 10*3/uL (ref 0.0–0.5)
HCT: 32.5 % — ABNORMAL LOW (ref 34.8–46.6)
HGB: 10.7 g/dL — ABNORMAL LOW (ref 11.6–15.9)
LYMPH#: 3.3 10*3/uL (ref 0.9–3.3)
LYMPH%: 28.8 % (ref 14.0–49.7)
MCH: 31.1 pg (ref 25.1–34.0)
MCHC: 32.9 g/dL (ref 31.5–36.0)
MCV: 94.5 fL (ref 79.5–101.0)
MONO#: 0.6 10*3/uL (ref 0.1–0.9)
MONO%: 5.3 % (ref 0.0–14.0)
NEUT%: 64.2 % (ref 38.4–76.8)
NEUTROS ABS: 7.3 10*3/uL — AB (ref 1.5–6.5)
Platelets: 339 10*3/uL (ref 145–400)
RBC: 3.44 10*6/uL — AB (ref 3.70–5.45)
RDW: 15.6 % — AB (ref 11.2–14.5)
WBC: 11.4 10*3/uL — AB (ref 3.9–10.3)

## 2014-06-08 NOTE — Assessment & Plan Note (Addendum)
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  Chemotherapy:  TC HP x2 cycles ( developed hand-foot syndrome) GC HP x1 cycle (developed dehydration and renal failure) CHP 1 cycle 04/08/2014 (bone pain and Nausea and fatigue and poor nutrition) HP 2 cycles 04/29/14 and 05/27/2014 Today's last cycle of neoadjuvant treatment  Chemotherapy related toxicities: 1. 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 Severe dehydration renal failure and intolerance to gemcitabine, resulted in discontinuation of gemcitabine 3. Continued dehydration even after discontinuing gemcitabine frequent IV fluids and potassium 4. Hypokalemia: Related to dehydration, improved with potassium supplementation 5. Chemotherapy induced nausea: Improved with stopping carboplatin 6. Perjeta induced diarrhea (alternating with constipation): Continuing to be a problem  MRI review: We reviewed the MRI before and after as it has been a significant improvement in the breast cancer however there is still residual disease. He presented her case in the multidisciplinary tumor board and the recommendation was to do mastectomy followed by adjuvant radiation therapy. Patient will continue every 3 week Herceptin maintenance I will see her back in February for follow-up. We will obtain an appointment for her to see Dr. Barry Dienes to discuss surgery

## 2014-06-08 NOTE — Addendum Note (Signed)
Addended by: Prentiss Bells on: 06/08/2014 06:18 PM   Modules accepted: Orders

## 2014-06-08 NOTE — Progress Notes (Signed)
Patient Care Team: Darreld Mclean, MD as PCP - General (Family Medicine) Rulon Eisenmenger, MD as Consulting Physician (Hematology and Oncology) [name], MD as Consulting Physician (General Surgery) Thea Silversmith, MD as Consulting Physician (Radiation Oncology)  DIAGNOSIS: Breast cancer of upper-outer quadrant of right female breast   Staging form: Breast, AJCC 7th Edition     Clinical: Stage IIA (T2, N0, cM0) - Unsigned       Staging comments: Staged at breast conference 12/29/13.      Pathologic: No stage assigned - Unsigned   SUMMARY OF ONCOLOGIC HISTORY:   Breast cancer of upper-outer quadrant of right female breast   12/16/2013 Mammogram 1. A palpable 4.1 cm diameter mass/asymmetry with associated coarse heterogeneous microcalcifications at 11 o'clock 2 cm from the right nipple demonstrates mammographic and sonographic features highly suggestive of malignancy.    12/16/2013 Initial Biopsy Breast cancer of upper-outer quadrant of right female breast; Invasive Ductal Cancer Er 100%, PR: 0%; Her2 Positive Ratio 4.28: Ki 67:24%;    12/23/2013 Breast MRI Right Breast 4.4 x 3 x 2.5 cm and a sub cm satellite lesion. No LN   01/28/2014 - 05/27/2014 Neo-Adjuvant Chemotherapy TC HP x2 cycles ( developed hand-foot syndrome) GC HP x1 cycle (developed dehydration and renal failure) CHP from 04/08/2014    CHIEF COMPLIANT: Follow-up after MRI  INTERVAL HISTORY: [name] is a 59 year old lady with above-mentioned history of right-sided breast cancer who completed neoadjuvant chemotherapy and underwent breast MRI and is here today to discuss the result. She was presented this morning in the multidisciplinary tumor board which recommended mastectomy followed by adjuvant radiation therapy. She is doing much better with regards to her eating. 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  REVIEW OF SYSTEMS:   Constitutional: Denies fevers,  chills or abnormal weight loss Eyes: Denies blurriness of vision Ears, nose, mouth, throat, and face: Denies mucositis or sore throat Respiratory: Denies cough, dyspnea or wheezes Cardiovascular: Denies palpitation, chest discomfort or lower extremity swelling Gastrointestinal:  Denies nausea, heartburn or change in bowel habits Skin: Denies abnormal skin rashes Lymphatics: Denies new lymphadenopathy or easy bruising Neurological:Denies numbness, tingling or new weaknesses Behavioral/Psych: Mood is stable, no new changes  Breast:  denies any pain or lumps or nodules in either breasts All other systems were reviewed with the patient and are negative.  I have reviewed the past medical history, past surgical history, social history and family history with the patient and they are unchanged from previous note.  ALLERGIES:  is allergic to codeine.  MEDICATIONS:  Current Outpatient Prescriptions  Medication Sig Dispense Refill  . ALPRAZolam (XANAX) 0.5 MG tablet TAKE 1 TABLET 3 TIMES A DAY AS NEEDED 90 tablet 5  . Alum & Mag Hydroxide-Simeth (MAGIC MOUTHWASH W/LIDOCAINE) SOLN Take 5 mLs by mouth 4 (four) times daily as needed for mouth pain (duke's formula with nystatin and lidocaine; all in 1:1 combo please.). 240 mL 1  . atenolol (TENORMIN) 50 MG tablet Take 50 mg by mouth 2 (two) times daily.    Marland Kitchen dexamethasone (DECADRON) 4 MG tablet   1  . lidocaine-prilocaine (EMLA) cream Apply 1 application topically as needed (port a cath site). Apply to [facility] a cath site 2 hours prior to chemotherapy appointment    . LORazepam (ATIVAN) 0.5 MG tablet Take 2 tablets (1 mg total) by mouth at bedtime. 30 tablet 0  . nystatin (MYCOSTATIN/NYSTOP) 100000 UNIT/GM POWD Apply to the  affected area daily until resolved. 60 g 1  . omeprazole (PRILOSEC) 20 MG capsule Take 20 mg by mouth every evening.     . ondansetron (ZOFRAN) 8 MG tablet TAKE 1 TABLET BY MOUTH EVERY 8 HOURS AS NEEDED FOR NAUSEA/VOMITING 30 tablet 0   . oxyCODONE-acetaminophen (ROXICET) 5-325 MG per tablet Take 1-2 tablets by mouth every 4 (four) hours as needed for severe pain (pain). 90 tablet 0  . potassium chloride SA (K-DUR,KLOR-CON) 20 MEQ tablet Take 1 tablet (20 mEq total) by mouth 2 (two) times daily. 30 tablet 0  . prochlorperazine (COMPAZINE) 10 MG tablet TAKE 1 TABLET BY MOUTH EVERY 6 HOURS AS NEEDED FOR NAUSEA/VOMITING 30 tablet 0  . sertraline (ZOLOFT) 50 MG tablet Take 1 tablet (50 mg total) by mouth at bedtime. 90 tablet 3   No current facility-administered medications for this visit.    PHYSICAL EXAMINATION: ECOG PERFORMANCE STATUS: 1 - Symptomatic but completely ambulatory  Filed Vitals:   06/08/14 1425  BP: 118/70  Pulse: 78  Temp: 98.3 F (36.8 C)  Resp: 18   Filed Weights   06/08/14 1425  Weight: 143 lb 1.6 oz (64.91 kg)    GENERAL:alert, no distress and comfortable SKIN: skin color, texture, turgor are normal, no rashes or significant lesions EYES: normal, Conjunctiva are pink and non-injected, sclera clear OROPHARYNX:no exudate, no erythema and lips, buccal mucosa, and tongue normal  NECK: supple, thyroid normal size, non-tender, without nodularity LYMPH:  no palpable lymphadenopathy in the cervical, axillary or inguinal LUNGS: clear to auscultation and percussion with normal breathing effort HEART: regular rate & rhythm and no murmurs and no lower extremity edema ABDOMEN:abdomen soft, non-tender and normal bowel sounds Musculoskeletal:no cyanosis of digits and no clubbing  NEURO: alert & oriented x 3 with fluent speech, peripheral neuropathy   LABORATORY DATA:  I have reviewed the data as listed   Chemistry      Component Value Date/Time   NA 136 06/08/2014 1407   NA 130* 02/26/2014 1238   K 3.5 06/08/2014 1407   K 4.0 02/26/2014 1238   CL 96 02/26/2014 1238   CO2 23 06/08/2014 1407   CO2 20 02/26/2014 1238   BUN 6.6* 06/08/2014 1407   BUN 7 02/26/2014 1238   CREATININE 0.8 06/08/2014  1407   CREATININE 0.59 02/26/2014 1238   CREATININE 0.76 04/03/2013 1420      Component Value Date/Time   CALCIUM 8.0* 06/08/2014 1407   CALCIUM 8.6 02/26/2014 1238   ALKPHOS 180* 06/08/2014 1407   ALKPHOS 151* 02/26/2014 1238   AST 19 06/08/2014 1407   AST 25 02/26/2014 1238   ALT 12 06/08/2014 1407   ALT 38* 02/26/2014 1238   BILITOT 0.23 06/08/2014 1407   BILITOT 0.7 02/26/2014 1238       Lab Results  Component Value Date   WBC 11.4* 06/08/2014   HGB 10.7* 06/08/2014   HCT 32.5* 06/08/2014   MCV 94.5 06/08/2014   PLT 339 06/08/2014   NEUTROABS 7.3* 06/08/2014     RADIOGRAPHIC STUDIES: I have personally reviewed the radiology reports and agreed with their findings. 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 Evaluate neoadjuvant therapy.  LABS:  None obtained today.  EXAM: BILATERAL BREAST MRI WITH AND WITHOUT CONTRAST  TECHNIQUE: Multiplanar, multisequence MR images of both breasts were obtained prior to and following the intravenous administration of 80m of MultiHance.  THREE-DIMENSIONAL  MR IMAGE RENDERING ON INDEPENDENT WORKSTATION:  Three-dimensional MR images were rendered by post-processing of the original MR data on an independent workstation. The three-dimensional MR images were interpreted, and findings are reported in the following complete MRI report for this study. Three dimensional images were evaluated at the independent DynaCad workstation  COMPARISON:  Prior mammograms.  12/23/2013 breast MR.  FINDINGS: Breast composition: c:  Heterogeneous fibroglandular tissue  Background parenchymal enhancement: Mild  Right breast: Very mild plateau type enhancement is identified within the upper right breast, encompassing the same area of previously identified 4.4 cm invasive malignancy. No other abnormal areas of enhancement are identified.  Biopsy clip artifact within the upper  and central right breast are noted.  Left breast: No mass or abnormal enhancement.  Lymph nodes: No abnormal appearing lymph nodes.  Ancillary findings: An upper left chest Port-A-Cath is noted. Mild cardiomegaly is identified.  IMPRESSION: Treatment response with very mild residual enhancement within the upper right breast, in the area of previously identified intensely enhancing invasive malignancy. No new or suspicious areas of enhancement bilaterally. No enlarged or abnormal lymph nodes.  Mild cardiomegaly.  RECOMMENDATION: Treatment plan.  BI-RADS CATEGORY  6: Known biopsy-proven malignancy.   Electronically Signed   By: Hassan Rowan M.D.   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  Chemotherapy:  TC HP x2 cycles ( developed hand-foot syndrome) GC HP x1 cycle (developed dehydration and renal failure) CHP 1 cycle 04/08/2014 (bone pain and Nausea and fatigue and poor nutrition) HP 2 cycles 04/29/14 and 05/27/2014 Today's last cycle of neoadjuvant treatment  Chemotherapy related toxicities: 1. 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 Severe dehydration renal failure and intolerance to gemcitabine, resulted in discontinuation of gemcitabine 3. Continued dehydration even after discontinuing gemcitabine frequent IV fluids and potassium 4. Hypokalemia: Related to dehydration, improved with potassium supplementation 5. Chemotherapy induced nausea: Improved with stopping carboplatin 6. Perjeta induced diarrhea (alternating with constipation): Continuing to be a problem  MRI review: We reviewed the MRI before and after as it has been a significant improvement in the  breast cancer however there is still residual disease. He presented her case in the multidisciplinary tumor board and the recommendation was to do mastectomy followed by adjuvant radiation therapy. Patient will continue every 3 week Herceptin maintenance I will see her back in February for follow-up. We will obtain an appointment for her to see Dr. Barry Dienes to discuss surgery   No orders of the defined types were placed in this encounter.   The patient has a good understanding of the overall plan. she agrees with it. She will call with any problems that may develop before her next visit here.   Rulon Eisenmenger, MD

## 2014-06-09 ENCOUNTER — Other Ambulatory Visit: Payer: Self-pay | Admitting: Nurse Practitioner

## 2014-06-09 ENCOUNTER — Telehealth: Payer: Self-pay | Admitting: Hematology and Oncology

## 2014-06-09 DIAGNOSIS — E2839 Other primary ovarian failure: Secondary | ICD-10-CM

## 2014-06-09 NOTE — Telephone Encounter (Signed)
gv and printed appt sched adn avs for pt for Jan adn Feb....2.12 @ 12pm with dr Barry Dienes pt is aware

## 2014-06-14 ENCOUNTER — Telehealth: Payer: Self-pay | Admitting: *Deleted

## 2014-06-14 ENCOUNTER — Other Ambulatory Visit: Payer: Self-pay | Admitting: *Deleted

## 2014-06-14 DIAGNOSIS — C50411 Malignant neoplasm of upper-outer quadrant of right female breast: Secondary | ICD-10-CM

## 2014-06-14 MED ORDER — OXYCODONE-ACETAMINOPHEN 5-325 MG PO TABS: 1.0000 | ORAL_TABLET | ORAL | Status: DC | PRN

## 2014-06-14 NOTE — Telephone Encounter (Signed)
Received message from [place], RN that patient is requesting refill on roxicet tablets. Prescription signed by Dr. Lindi Adie and placed in binder in injection room. Left VM for patient that prescription is ready to pickup between 8:30am and 4:00pm and to bring photo ID when picking it up.

## 2014-06-16 ENCOUNTER — Other Ambulatory Visit: Payer: Self-pay | Admitting: *Deleted

## 2014-06-16 DIAGNOSIS — C50411 Malignant neoplasm of upper-outer quadrant of right female breast: Secondary | ICD-10-CM

## 2014-06-17 ENCOUNTER — Telehealth: Payer: Self-pay | Admitting: Hematology and Oncology

## 2014-06-17 ENCOUNTER — Other Ambulatory Visit: Payer: Medicaid Other

## 2014-06-17 ENCOUNTER — Ambulatory Visit: Payer: Medicaid Other

## 2014-06-17 NOTE — Telephone Encounter (Signed)
, °

## 2014-06-20 ENCOUNTER — Telehealth: Payer: Self-pay | Admitting: Hematology and Oncology

## 2014-06-20 ENCOUNTER — Telehealth: Payer: Self-pay | Admitting: *Deleted

## 2014-06-20 NOTE — Telephone Encounter (Signed)
, °

## 2014-06-20 NOTE — Telephone Encounter (Signed)
Per staff message and POF I have scheduled appts. Advised scheduler of appts. JMW  

## 2014-06-22 ENCOUNTER — Telehealth: Payer: Self-pay | Admitting: Hematology and Oncology

## 2014-06-22 NOTE — Telephone Encounter (Signed)
, °

## 2014-06-23 ENCOUNTER — Other Ambulatory Visit: Payer: Self-pay | Admitting: *Deleted

## 2014-06-23 DIAGNOSIS — C50411 Malignant neoplasm of upper-outer quadrant of right female breast: Secondary | ICD-10-CM

## 2014-06-23 MED ORDER — OXYCODONE-ACETAMINOPHEN 5-325 MG PO TABS: 1.0000 | ORAL_TABLET | ORAL | Status: DC | PRN

## 2014-06-24 ENCOUNTER — Ambulatory Visit (HOSPITAL_BASED_OUTPATIENT_CLINIC_OR_DEPARTMENT_OTHER): Payer: Medicaid Other

## 2014-06-24 ENCOUNTER — Other Ambulatory Visit (HOSPITAL_BASED_OUTPATIENT_CLINIC_OR_DEPARTMENT_OTHER): Payer: Medicaid Other

## 2014-06-24 ENCOUNTER — Other Ambulatory Visit: Payer: Self-pay | Admitting: Hematology and Oncology

## 2014-06-24 DIAGNOSIS — C50811 Malignant neoplasm of overlapping sites of right female breast: Secondary | ICD-10-CM

## 2014-06-24 DIAGNOSIS — Z5112 Encounter for antineoplastic immunotherapy: Secondary | ICD-10-CM

## 2014-06-24 DIAGNOSIS — C50411 Malignant neoplasm of upper-outer quadrant of right female breast: Secondary | ICD-10-CM

## 2014-06-24 LAB — CBC WITH DIFFERENTIAL/PLATELET
BASO%: 0.1 % (ref 0.0–2.0)
Basophils Absolute: 0 10*3/uL (ref 0.0–0.1)
EOS%: 1.6 % (ref 0.0–7.0)
Eosinophils Absolute: 0.2 10*3/uL (ref 0.0–0.5)
HCT: 34.5 % — ABNORMAL LOW (ref 34.8–46.6)
HEMOGLOBIN: 11.2 g/dL — AB (ref 11.6–15.9)
LYMPH%: 33.5 % (ref 14.0–49.7)
MCH: 30.6 pg (ref 25.1–34.0)
MCHC: 32.5 g/dL (ref 31.5–36.0)
MCV: 94.3 fL (ref 79.5–101.0)
MONO#: 0.5 10*3/uL (ref 0.1–0.9)
MONO%: 5.4 % (ref 0.0–14.0)
NEUT#: 6 10*3/uL (ref 1.5–6.5)
NEUT%: 59.4 % (ref 38.4–76.8)
Platelets: 306 10*3/uL (ref 145–400)
RBC: 3.66 10*6/uL — ABNORMAL LOW (ref 3.70–5.45)
RDW: 15.1 % — ABNORMAL HIGH (ref 11.2–14.5)
WBC: 10.1 10*3/uL (ref 3.9–10.3)
lymph#: 3.4 10*3/uL — ABNORMAL HIGH (ref 0.9–3.3)

## 2014-06-24 LAB — COMPREHENSIVE METABOLIC PANEL (CC13)
ALK PHOS: 202 U/L — AB (ref 40–150)
ALT: 9 U/L (ref 0–55)
ANION GAP: 9 meq/L (ref 3–11)
AST: 18 U/L (ref 5–34)
Albumin: 2.7 g/dL — ABNORMAL LOW (ref 3.5–5.0)
BUN: 6 mg/dL — ABNORMAL LOW (ref 7.0–26.0)
CALCIUM: 8.2 mg/dL — AB (ref 8.4–10.4)
CO2: 25 meq/L (ref 22–29)
Chloride: 106 mEq/L (ref 98–109)
Creatinine: 0.7 mg/dL (ref 0.6–1.1)
GLUCOSE: 101 mg/dL (ref 70–140)
Potassium: 3.2 mEq/L — ABNORMAL LOW (ref 3.5–5.1)
Sodium: 140 mEq/L (ref 136–145)
TOTAL PROTEIN: 6.2 g/dL — AB (ref 6.4–8.3)
Total Bilirubin: 0.21 mg/dL (ref 0.20–1.20)

## 2014-06-24 MED ORDER — SODIUM CHLORIDE 0.9 % IJ SOLN
10.0000 mL | INTRAMUSCULAR | Status: DC | PRN
  Administered 2014-06-24: 10 mL
  Filled 2014-06-24: qty 10

## 2014-06-24 MED ORDER — ACETAMINOPHEN 325 MG PO TABS
650.0000 mg | ORAL_TABLET | Freq: Once | ORAL | Status: AC
  Administered 2014-06-24: 650 mg via ORAL

## 2014-06-24 MED ORDER — DIPHENHYDRAMINE HCL 25 MG PO CAPS
50.0000 mg | ORAL_CAPSULE | Freq: Once | ORAL | Status: AC
  Administered 2014-06-24: 50 mg via ORAL

## 2014-06-24 MED ORDER — DIPHENHYDRAMINE HCL 25 MG PO CAPS
ORAL_CAPSULE | ORAL | Status: AC
  Filled 2014-06-24: qty 2

## 2014-06-24 MED ORDER — ACETAMINOPHEN 325 MG PO TABS
ORAL_TABLET | ORAL | Status: AC
  Filled 2014-06-24: qty 2

## 2014-06-24 MED ORDER — TRASTUZUMAB CHEMO INJECTION 440 MG
6.0000 mg/kg | Freq: Once | INTRAVENOUS | Status: AC
  Administered 2014-06-24: 399 mg via INTRAVENOUS
  Filled 2014-06-24: qty 19

## 2014-06-24 MED ORDER — TRASTUZUMAB CHEMO INJECTION 440 MG: 8.0000 mg/kg | Freq: Once | INTRAVENOUS | Status: DC

## 2014-06-24 MED ORDER — HEPARIN SOD (PORK) LOCK FLUSH 100 UNIT/ML IV SOLN
500.0000 [IU] | Freq: Once | INTRAVENOUS | Status: AC | PRN
  Administered 2014-06-24: 500 [IU]
  Filled 2014-06-24: qty 5

## 2014-06-24 MED ORDER — SODIUM CHLORIDE 0.9 % IV SOLN
Freq: Once | INTRAVENOUS | Status: AC
  Administered 2014-06-24: 12:00:00 via INTRAVENOUS

## 2014-06-24 NOTE — Patient Instructions (Signed)
[facility] Discharge Instructions for Patients Receiving Chemotherapy  Today you received the following chemotherapy agents Herceptin.  To help prevent nausea and vomiting after your treatment, we encourage you to take your nausea medication as prescribed.   If you develop nausea and vomiting that is not controlled by your nausea medication, call the clinic.   BELOW ARE SYMPTOMS THAT SHOULD BE REPORTED IMMEDIATELY:  *FEVER GREATER THAN 100.5 F  *CHILLS WITH OR WITHOUT FEVER  NAUSEA AND VOMITING THAT IS NOT CONTROLLED WITH YOUR NAUSEA MEDICATION  *UNUSUAL SHORTNESS OF BREATH  *UNUSUAL BRUISING OR BLEEDING  TENDERNESS IN MOUTH AND THROAT WITH OR WITHOUT PRESENCE OF ULCERS  *URINARY PROBLEMS  *BOWEL PROBLEMS  UNUSUAL RASH Items with * indicate a potential emergency and should be followed up as soon as possible.  Feel free to call the clinic you have any questions or concerns. The clinic phone number is [phone].    

## 2014-06-30 ENCOUNTER — Other Ambulatory Visit: Payer: Self-pay | Admitting: Hematology and Oncology

## 2014-06-30 DIAGNOSIS — C50411 Malignant neoplasm of upper-outer quadrant of right female breast: Secondary | ICD-10-CM

## 2014-07-01 ENCOUNTER — Other Ambulatory Visit (INDEPENDENT_AMBULATORY_CARE_PROVIDER_SITE_OTHER): Payer: Self-pay | Admitting: General Surgery

## 2014-07-01 DIAGNOSIS — C50911 Malignant neoplasm of unspecified site of right female breast: Secondary | ICD-10-CM

## 2014-07-04 ENCOUNTER — Other Ambulatory Visit: Payer: Self-pay

## 2014-07-04 DIAGNOSIS — C50411 Malignant neoplasm of upper-outer quadrant of right female breast: Secondary | ICD-10-CM

## 2014-07-04 MED ORDER — OXYCODONE-ACETAMINOPHEN 5-325 MG PO TABS: 1.0000 | ORAL_TABLET | ORAL | Status: DC | PRN

## 2014-07-04 NOTE — Telephone Encounter (Signed)
Let pt know prescription in book ready for pickup.  Pt voiced understanding.

## 2014-07-06 ENCOUNTER — Telehealth: Payer: Self-pay | Admitting: Hematology and Oncology

## 2014-07-06 ENCOUNTER — Telehealth: Payer: Self-pay | Admitting: *Deleted

## 2014-07-06 NOTE — Telephone Encounter (Signed)
, °

## 2014-07-06 NOTE — Telephone Encounter (Signed)
Per staff message and POF I have scheduled appts. Advised scheduler of appts. JMW  

## 2014-07-08 ENCOUNTER — Other Ambulatory Visit: Payer: Self-pay

## 2014-07-08 ENCOUNTER — Other Ambulatory Visit: Payer: Medicaid Other

## 2014-07-08 ENCOUNTER — Ambulatory Visit: Payer: Medicaid Other

## 2014-07-08 ENCOUNTER — Telehealth: Payer: Self-pay | Admitting: Hematology and Oncology

## 2014-07-11 ENCOUNTER — Ambulatory Visit: Payer: Medicaid Other | Admitting: Nurse Practitioner

## 2014-07-11 ENCOUNTER — Other Ambulatory Visit: Payer: Medicaid Other

## 2014-07-11 ENCOUNTER — Ambulatory Visit: Payer: Medicaid Other | Admitting: Hematology and Oncology

## 2014-07-12 ENCOUNTER — Other Ambulatory Visit: Payer: Self-pay

## 2014-07-12 ENCOUNTER — Telehealth: Payer: Self-pay | Admitting: *Deleted

## 2014-07-12 ENCOUNTER — Other Ambulatory Visit: Payer: Self-pay | Admitting: *Deleted

## 2014-07-12 DIAGNOSIS — C50411 Malignant neoplasm of upper-outer quadrant of right female breast: Secondary | ICD-10-CM

## 2014-07-12 MED ORDER — OXYCODONE-ACETAMINOPHEN 5-325 MG PO TABS: 1.0000 | ORAL_TABLET | ORAL | Status: DC | PRN

## 2014-07-12 MED ORDER — ATENOLOL 50 MG PO TABS: 50.0000 mg | ORAL_TABLET | Freq: Two times a day (BID) | ORAL | Status: DC

## 2014-07-12 NOTE — Telephone Encounter (Signed)
PLEASE CALL PT. WHEN PRESCRIPTION IS READY. 

## 2014-07-12 NOTE — Telephone Encounter (Signed)
LMOVM - prescription ready for pickup.  Scrip placed in book

## 2014-07-13 ENCOUNTER — Other Ambulatory Visit: Payer: Self-pay | Admitting: *Deleted

## 2014-07-13 DIAGNOSIS — C50411 Malignant neoplasm of upper-outer quadrant of right female breast: Secondary | ICD-10-CM

## 2014-07-14 ENCOUNTER — Ambulatory Visit: Payer: Medicaid Other | Admitting: Hematology and Oncology

## 2014-07-14 ENCOUNTER — Other Ambulatory Visit: Payer: Medicaid Other

## 2014-07-14 ENCOUNTER — Ambulatory Visit: Payer: Medicaid Other

## 2014-07-14 ENCOUNTER — Telehealth: Payer: Self-pay | Admitting: *Deleted

## 2014-07-14 NOTE — Telephone Encounter (Signed)
error 

## 2014-07-14 NOTE — Assessment & Plan Note (Signed)
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  Chemotherapy:  TC HP x2 cycles ( developed hand-foot syndrome) GC HP x1 cycle (developed dehydration and renal failure) CHP 1 cycle 04/08/2014 (bone pain and Nausea and fatigue and poor nutrition) HP 2 cycles 04/29/14 and 05/27/2014   Chemotherapy related toxicities: 1. 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 Severe dehydration renal failure and intolerance to gemcitabine, resulted in discontinuation of gemcitabine 3. Continued dehydration even after discontinuing gemcitabine frequent IV fluids and potassium 4. Hypokalemia: Related to dehydration, improved with potassium supplementation 5. Chemotherapy induced nausea: Improved with stopping carboplatin 6. Perjeta induced diarrhea (alternating with constipation): resolved  Plan: 1. Continue Herceptin maintenance every 3 weeks 2. Surgery is being planned by Dr. Barry Dienes.  Return to clinic in 6 weeks for follow-up

## 2014-07-15 ENCOUNTER — Telehealth: Payer: Self-pay | Admitting: Hematology and Oncology

## 2014-07-15 NOTE — Telephone Encounter (Signed)
pt lmonvm to r/s 2/25 appt. per pt she was not feeling well and wants to r/s to next wk. returned call and lmonvm for pt with new appt for 3/3 @ 9am. schedule mailed.

## 2014-07-20 ENCOUNTER — Telehealth: Payer: Self-pay | Admitting: *Deleted

## 2014-07-20 DIAGNOSIS — C50411 Malignant neoplasm of upper-outer quadrant of right female breast: Secondary | ICD-10-CM

## 2014-07-20 MED ORDER — OXYCODONE-ACETAMINOPHEN 5-325 MG PO TABS: 1.0000 | ORAL_TABLET | ORAL | Status: DC | PRN

## 2014-07-20 NOTE — Telephone Encounter (Signed)
Patient called to request a refill of oxycodone.  She is coming in tomorrow to see Dr. Lindi Adie.  Will pass the request to Dr. Lindi Adie.

## 2014-07-20 NOTE — Addendum Note (Signed)
Addended by: Prentiss Bells on: 07/20/2014 03:41 PM   Modules accepted: Orders

## 2014-07-21 ENCOUNTER — Other Ambulatory Visit (HOSPITAL_BASED_OUTPATIENT_CLINIC_OR_DEPARTMENT_OTHER): Payer: Medicaid Other

## 2014-07-21 ENCOUNTER — Ambulatory Visit (HOSPITAL_BASED_OUTPATIENT_CLINIC_OR_DEPARTMENT_OTHER): Payer: Medicaid Other | Admitting: Hematology and Oncology

## 2014-07-21 ENCOUNTER — Ambulatory Visit (HOSPITAL_BASED_OUTPATIENT_CLINIC_OR_DEPARTMENT_OTHER): Payer: Medicaid Other

## 2014-07-21 ENCOUNTER — Telehealth: Payer: Self-pay | Admitting: Hematology and Oncology

## 2014-07-21 VITALS — BP 103/67 | HR 68 | Temp 98.0°F | Resp 18 | Ht 64.0 in | Wt 140.4 lb

## 2014-07-21 DIAGNOSIS — C50411 Malignant neoplasm of upper-outer quadrant of right female breast: Secondary | ICD-10-CM

## 2014-07-21 DIAGNOSIS — E876 Hypokalemia: Secondary | ICD-10-CM

## 2014-07-21 DIAGNOSIS — D6481 Anemia due to antineoplastic chemotherapy: Secondary | ICD-10-CM

## 2014-07-21 DIAGNOSIS — Z170 Estrogen receptor positive status [ER+]: Secondary | ICD-10-CM

## 2014-07-21 DIAGNOSIS — E860 Dehydration: Secondary | ICD-10-CM

## 2014-07-21 DIAGNOSIS — Z5112 Encounter for antineoplastic immunotherapy: Secondary | ICD-10-CM

## 2014-07-21 DIAGNOSIS — R197 Diarrhea, unspecified: Secondary | ICD-10-CM

## 2014-07-21 LAB — COMPREHENSIVE METABOLIC PANEL (CC13)
ALK PHOS: 169 U/L — AB (ref 40–150)
ALT: 9 U/L (ref 0–55)
AST: 18 U/L (ref 5–34)
Albumin: 3.2 g/dL — ABNORMAL LOW (ref 3.5–5.0)
Anion Gap: 11 mEq/L (ref 3–11)
BILIRUBIN TOTAL: 0.36 mg/dL (ref 0.20–1.20)
BUN: 5 mg/dL — AB (ref 7.0–26.0)
CO2: 23 mEq/L (ref 22–29)
Calcium: 8.8 mg/dL (ref 8.4–10.4)
Chloride: 104 mEq/L (ref 98–109)
Creatinine: 0.7 mg/dL (ref 0.6–1.1)
GLUCOSE: 99 mg/dL (ref 70–140)
Potassium: 3.2 mEq/L — ABNORMAL LOW (ref 3.5–5.1)
Sodium: 138 mEq/L (ref 136–145)
Total Protein: 6.9 g/dL (ref 6.4–8.3)

## 2014-07-21 LAB — CBC WITH DIFFERENTIAL/PLATELET
BASO%: 0.3 % (ref 0.0–2.0)
BASOS ABS: 0 10*3/uL (ref 0.0–0.1)
EOS%: 1 % (ref 0.0–7.0)
Eosinophils Absolute: 0.1 10*3/uL (ref 0.0–0.5)
HEMATOCRIT: 33.9 % — AB (ref 34.8–46.6)
HEMOGLOBIN: 10.8 g/dL — AB (ref 11.6–15.9)
LYMPH%: 28.7 % (ref 14.0–49.7)
MCH: 29 pg (ref 25.1–34.0)
MCHC: 31.9 g/dL (ref 31.5–36.0)
MCV: 90.9 fL (ref 79.5–101.0)
MONO#: 0.6 10*3/uL (ref 0.1–0.9)
MONO%: 5.1 % (ref 0.0–14.0)
NEUT#: 7.8 10*3/uL — ABNORMAL HIGH (ref 1.5–6.5)
NEUT%: 64.9 % (ref 38.4–76.8)
Platelets: 374 10*3/uL (ref 145–400)
RBC: 3.73 10*6/uL (ref 3.70–5.45)
RDW: 14.5 % (ref 11.2–14.5)
WBC: 12 10*3/uL — ABNORMAL HIGH (ref 3.9–10.3)
lymph#: 3.4 10*3/uL — ABNORMAL HIGH (ref 0.9–3.3)

## 2014-07-21 MED ORDER — ACETAMINOPHEN 325 MG PO TABS
ORAL_TABLET | ORAL | Status: AC
  Filled 2014-07-21: qty 2

## 2014-07-21 MED ORDER — SODIUM CHLORIDE 0.9 % IV SOLN
Freq: Once | INTRAVENOUS | Status: AC
  Administered 2014-07-21: 11:00:00 via INTRAVENOUS

## 2014-07-21 MED ORDER — ACETAMINOPHEN 325 MG PO TABS
650.0000 mg | ORAL_TABLET | Freq: Once | ORAL | Status: AC
  Administered 2014-07-21: 650 mg via ORAL

## 2014-07-21 MED ORDER — HEPARIN SOD (PORK) LOCK FLUSH 100 UNIT/ML IV SOLN
500.0000 [IU] | Freq: Once | INTRAVENOUS | Status: AC | PRN
  Administered 2014-07-21: 500 [IU]
  Filled 2014-07-21: qty 5

## 2014-07-21 MED ORDER — SODIUM CHLORIDE 0.9 % IJ SOLN
10.0000 mL | INTRAMUSCULAR | Status: DC | PRN
  Administered 2014-07-21: 10 mL
  Filled 2014-07-21: qty 10

## 2014-07-21 MED ORDER — DIPHENHYDRAMINE HCL 25 MG PO CAPS
ORAL_CAPSULE | ORAL | Status: AC
  Filled 2014-07-21: qty 2

## 2014-07-21 MED ORDER — TRASTUZUMAB CHEMO INJECTION 440 MG
6.0000 mg/kg | Freq: Once | INTRAVENOUS | Status: AC
  Administered 2014-07-21: 399 mg via INTRAVENOUS
  Filled 2014-07-21: qty 19

## 2014-07-21 MED ORDER — DIPHENHYDRAMINE HCL 25 MG PO CAPS
50.0000 mg | ORAL_CAPSULE | Freq: Once | ORAL | Status: AC
  Administered 2014-07-21: 50 mg via ORAL

## 2014-07-21 NOTE — Progress Notes (Signed)
Patient Care Team: Darreld Mclean, MD as PCP - General (Family Medicine) Rulon Eisenmenger, MD as Consulting Physician (Hematology and Oncology) [name], MD as Consulting Physician (General Surgery) Thea Silversmith, MD as Consulting Physician (Radiation Oncology)  DIAGNOSIS: Breast cancer of upper-outer quadrant of right female breast   Staging form: Breast, AJCC 7th Edition     Clinical: Stage IIA (T2, N0, cM0) - Unsigned       Staging comments: Staged at breast conference 12/29/13.      Pathologic: No stage assigned - Unsigned   SUMMARY OF ONCOLOGIC HISTORY:   Breast cancer of upper-outer quadrant of right female breast   12/16/2013 Mammogram 1. A palpable 4.1 cm diameter mass/asymmetry with associated coarse heterogeneous microcalcifications at 11 o'clock 2 cm from the right nipple demonstrates mammographic and sonographic features highly suggestive of malignancy.    12/16/2013 Initial Biopsy Breast cancer of upper-outer quadrant of right female breast; Invasive Ductal Cancer Er 100%, PR: 0%; Her2 Positive Ratio 4.28: Ki 67:24%;    12/23/2013 Breast MRI Right Breast 4.4 x 3 x 2.5 cm and a sub cm satellite lesion. No LN   01/28/2014 - 05/27/2014 Neo-Adjuvant Chemotherapy TC HP x2 cycles ( developed hand-foot syndrome) GC HP x1 cycle (developed dehydration and renal failure) CHP from 04/08/2014    CHIEF COMPLIANT: Follow-up on Herceptin maintenance  INTERVAL HISTORY: [name] is a 59 year old with above-mentioned history of right-sided breast cancer treated with neoadjuvant chemotherapy and is here today for Herceptin maintenance therapy. Patient reports that she had turned a corner and he is eating better feeling better has better energy levels. Denies any nausea vomiting diarrhea or constipation. She has an appointment for breast surgery this week.  REVIEW OF SYSTEMS:   Constitutional: Denies fevers, chills or abnormal weight loss Eyes: Denies blurriness of vision Ears, nose,  mouth, throat, and face: Denies mucositis or sore throat Respiratory: Denies cough, dyspnea or wheezes Cardiovascular: Denies palpitation, chest discomfort or lower extremity swelling Gastrointestinal:  Denies nausea, heartburn or change in bowel habits Skin: Denies abnormal skin rashes Lymphatics: Denies new lymphadenopathy or easy bruising Neurological:Denies numbness, tingling or new weaknesses Behavioral/Psych: Mood is stable, no new changes  Breast:  denies any pain or lumps or nodules in either breasts All other systems were reviewed with the patient and are negative.  I have reviewed the past medical history, past surgical history, social history and family history with the patient and they are unchanged from previous note.  ALLERGIES:  is allergic to codeine.  MEDICATIONS:  Current Outpatient Prescriptions  Medication Sig Dispense Refill  . ALPRAZolam (XANAX) 0.5 MG tablet TAKE 1 TABLET 3 TIMES A DAY AS NEEDED 90 tablet 5  . atenolol (TENORMIN) 50 MG tablet Take 1 tablet (50 mg total) by mouth 2 (two) times daily. 60 tablet 1  . dexamethasone (DECADRON) 4 MG tablet   1  . lidocaine-prilocaine (EMLA) cream Apply 1 application topically as needed (port a cath site). Apply to [facility] a cath site 2 hours prior to chemotherapy appointment    . LORazepam (ATIVAN) 0.5 MG tablet Take 2 tablets (1 mg total) by mouth at bedtime. 30 tablet 0  . nystatin (MYCOSTATIN/NYSTOP) 100000 UNIT/GM POWD Apply to the affected area daily until resolved. 60 g 1  . oxyCODONE-acetaminophen (ROXICET) 5-325 MG per tablet Take 1-2 tablets by mouth every 4 (four) hours as needed for severe pain (pain). 90 tablet 0  . potassium chloride SA (K-DUR,KLOR-CON) 20 MEQ tablet Take 1 tablet (20  mEq total) by mouth 2 (two) times daily. 30 tablet 0  . prochlorperazine (COMPAZINE) 10 MG tablet TAKE 1 TABLET BY MOUTH EVERY 6 HOURS AS NEEDED FOR NAUSEA/VOMITING 30 tablet 0  . sertraline (ZOLOFT) 50 MG tablet Take 1 tablet (50  mg total) by mouth at bedtime. 90 tablet 3  . Alum & Mag Hydroxide-Simeth (MAGIC MOUTHWASH W/LIDOCAINE) SOLN Take 5 mLs by mouth 4 (four) times daily as needed for mouth pain (duke's formula with nystatin and lidocaine; all in 1:1 combo please.). (Patient not taking: Reported on 07/21/2014) 240 mL 1  . omeprazole (PRILOSEC) 20 MG capsule Take 20 mg by mouth every evening.     . ondansetron (ZOFRAN) 8 MG tablet TAKE 1 TABLET BY MOUTH EVERY 8 HOURS AS NEEDED FOR NAUSEA/VOMITING 30 tablet 0   No current facility-administered medications for this visit.    PHYSICAL EXAMINATION: ECOG PERFORMANCE STATUS: 1 - Symptomatic but completely ambulatory  Filed Vitals:   07/21/14 0857  BP: 103/67  Pulse: 68  Temp: 98 F (36.7 C)  Resp: 18   Filed Weights   07/21/14 0857  Weight: 140 lb 6.4 oz (63.685 kg)    GENERAL:alert, no distress and comfortable SKIN: skin color, texture, turgor are normal, no rashes or significant lesions EYES: normal, Conjunctiva are pink and non-injected, sclera clear OROPHARYNX:no exudate, no erythema and lips, buccal mucosa, and tongue normal  NECK: supple, thyroid normal size, non-tender, without nodularity LYMPH:  no palpable lymphadenopathy in the cervical, axillary or inguinal LUNGS: clear to auscultation and percussion with normal breathing effort HEART: regular rate & rhythm and no murmurs and no lower extremity edema ABDOMEN:abdomen soft, non-tender and normal bowel sounds Musculoskeletal:no cyanosis of digits and no clubbing  NEURO: alert & oriented x 3 with fluent speech, no focal motor/sensory deficits  LABORATORY DATA:  I have reviewed the data as listed   Chemistry      Component Value Date/Time   NA 138 07/21/2014 0842   NA 130* 02/26/2014 1238   K 3.2* 07/21/2014 0842   K 4.0 02/26/2014 1238   CL 96 02/26/2014 1238   CO2 23 07/21/2014 0842   CO2 20 02/26/2014 1238   BUN 5.0* 07/21/2014 0842   BUN 7 02/26/2014 1238   CREATININE 0.7 07/21/2014  0842   CREATININE 0.59 02/26/2014 1238   CREATININE 0.76 04/03/2013 1420      Component Value Date/Time   CALCIUM 8.8 07/21/2014 0842   CALCIUM 8.6 02/26/2014 1238   ALKPHOS 169* 07/21/2014 0842   ALKPHOS 151* 02/26/2014 1238   AST 18 07/21/2014 0842   AST 25 02/26/2014 1238   ALT 9 07/21/2014 0842   ALT 38* 02/26/2014 1238   BILITOT 0.36 07/21/2014 0842   BILITOT 0.7 02/26/2014 1238       Lab Results  Component Value Date   WBC 12.0* 07/21/2014   HGB 10.8* 07/21/2014   HCT 33.9* 07/21/2014   MCV 90.9 07/21/2014   PLT 374 07/21/2014   NEUTROABS 7.8* 07/21/2014     RADIOGRAPHIC STUDIES: I have personally reviewed the radiology reports and agreed with their findings. No results found.   ASSESSMENT & PLAN:  Breast cancer of upper-outer quadrant of right female breast Right breast invasive ductal carcinoma 4.4 cm in size with a satellite lesion based on MRI result there were no abnormal lymph nodes ER 100% positive PR 0% HER-2/neu positive (HER-2/CEP 17 ratio 4.28) grade 3 T2, N0, M0 stage II a.  Chemotherapy:  TC HP x2 cycles (  developed hand-foot syndrome) GC HP x1 cycle (developed dehydration and renal failure) CHP 1 cycle 04/08/2014 (bone pain and Nausea and fatigue and poor nutrition) HP 2 cycles 04/29/14 and 05/27/2014  Current treatment: Maintenance Herceptin every 3 weeks  Chemotherapy related toxicities: 1. 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 Severe dehydration renal failure and intolerance to gemcitabine, resulted in discontinuation of gemcitabine 3. Continued dehydration even after discontinuing gemcitabine frequent IV fluids and potassium 4. Hypokalemia: Related to dehydration, improved with potassium supplementation 5. Chemotherapy induced nausea: Improved with stopping carboplatin 6. Perjeta induced diarrhea (alternating with constipation): No longer a problem since completing Perjeta 7.  Chemotherapy-induced anemia hemoglobin 10.8: Watchful monitoring  MRI review: Good response to chemotherapy but still has residual disease  Patient will continue every 3 week Herceptin maintenance. Patient had seen Dr. Barry Dienes and her surgery date is 07/22/14    No orders of the defined types were placed in this encounter.   The patient has a good understanding of the overall plan. she agrees with it. She will call with any problems that may develop before her next visit here.   Rulon Eisenmenger, MD

## 2014-07-21 NOTE — Assessment & Plan Note (Addendum)
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  Chemotherapy:  TC HP x2 cycles ( developed hand-foot syndrome) GC HP x1 cycle (developed dehydration and renal failure) CHP 1 cycle 04/08/2014 (bone pain and Nausea and fatigue and poor nutrition) HP 2 cycles 04/29/14 and 05/27/2014  Current treatment: Maintenance Herceptin every 3 weeks  Chemotherapy related toxicities: 1. 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 Severe dehydration renal failure and intolerance to gemcitabine, resulted in discontinuation of gemcitabine 3. Continued dehydration even after discontinuing gemcitabine frequent IV fluids and potassium 4. Hypokalemia: Related to dehydration, improved with potassium supplementation 5. Chemotherapy induced nausea: Improved with stopping carboplatin 6. Perjeta induced diarrhea (alternating with constipation): No longer a problem since completing Perjeta  MRI review: Good response to chemotherapy but still has residual disease  Patient will continue every 3 week Herceptin maintenance. Patient had seen Dr. Barry Dienes and her surgery date is 07/22/14

## 2014-07-21 NOTE — Patient Instructions (Signed)
[facility] Discharge Instructions for Patients Receiving Chemotherapy  Today you received the following chemotherapy agents herceptin   To help prevent nausea and vomiting after your treatment, we encourage you to take your nausea medication as directed   If you develop nausea and vomiting that is not controlled by your nausea medication, call the clinic.   BELOW ARE SYMPTOMS THAT SHOULD BE REPORTED IMMEDIATELY:  *FEVER GREATER THAN 100.5 F  *CHILLS WITH OR WITHOUT FEVER  NAUSEA AND VOMITING THAT IS NOT CONTROLLED WITH YOUR NAUSEA MEDICATION  *UNUSUAL SHORTNESS OF BREATH  *UNUSUAL BRUISING OR BLEEDING  TENDERNESS IN MOUTH AND THROAT WITH OR WITHOUT PRESENCE OF ULCERS  *URINARY PROBLEMS  *BOWEL PROBLEMS  UNUSUAL RASH Items with * indicate a potential emergency and should be followed up as soon as possible.  Feel free to call the clinic you have any questions or concerns. The clinic phone number is [phone].  

## 2014-07-21 NOTE — Telephone Encounter (Signed)
S/w pt and she is aware of her 3/18 visit that we will keep incase her path comes back if not we will see her on 3/24 as dr Lindi Adie is out 3/25

## 2014-07-22 ENCOUNTER — Encounter (HOSPITAL_BASED_OUTPATIENT_CLINIC_OR_DEPARTMENT_OTHER): Payer: Self-pay | Admitting: *Deleted

## 2014-07-22 NOTE — Progress Notes (Addendum)
Pt coming in for BMET and EKG on Monday. Dr. Ola Spurr reviewed cardiology note from 07/05/2013 [facility] for surgery.

## 2014-07-25 ENCOUNTER — Other Ambulatory Visit: Payer: Self-pay

## 2014-07-25 ENCOUNTER — Encounter (HOSPITAL_BASED_OUTPATIENT_CLINIC_OR_DEPARTMENT_OTHER)
Admission: RE | Admit: 2014-07-25 | Discharge: 2014-07-25 | Disposition: A | Discharge status: Home / Self Care | Payer: Medicaid Other | Source: Ambulatory Visit | Attending: General Surgery | Admitting: General Surgery

## 2014-07-25 DIAGNOSIS — Z90710 Acquired absence of both cervix and uterus: Secondary | ICD-10-CM | POA: Diagnosis not present

## 2014-07-25 DIAGNOSIS — C50911 Malignant neoplasm of unspecified site of right female breast: Secondary | ICD-10-CM | POA: Diagnosis present

## 2014-07-25 DIAGNOSIS — F419 Anxiety disorder, unspecified: Secondary | ICD-10-CM | POA: Diagnosis not present

## 2014-07-25 DIAGNOSIS — K219 Gastro-esophageal reflux disease without esophagitis: Secondary | ICD-10-CM | POA: Diagnosis not present

## 2014-07-25 DIAGNOSIS — F329 Major depressive disorder, single episode, unspecified: Secondary | ICD-10-CM | POA: Diagnosis not present

## 2014-07-25 DIAGNOSIS — M1990 Unspecified osteoarthritis, unspecified site: Secondary | ICD-10-CM | POA: Diagnosis not present

## 2014-07-25 DIAGNOSIS — K759 Inflammatory liver disease, unspecified: Secondary | ICD-10-CM | POA: Diagnosis not present

## 2014-07-25 DIAGNOSIS — Z886 Allergy status to analgesic agent status: Secondary | ICD-10-CM | POA: Diagnosis not present

## 2014-07-25 DIAGNOSIS — F17210 Nicotine dependence, cigarettes, uncomplicated: Secondary | ICD-10-CM | POA: Diagnosis not present

## 2014-07-25 LAB — BASIC METABOLIC PANEL
Anion gap: 6 (ref 5–15)
BUN: <5 mg/dL — ABNORMAL LOW (ref 6–23)
CALCIUM: 8.4 mg/dL (ref 8.4–10.5)
CHLORIDE: 106 mmol/L (ref 96–112)
CO2: 23 mmol/L (ref 19–32)
CREATININE: 0.59 mg/dL (ref 0.50–1.10)
GFR calc Af Amer: >90 mL/min (ref >90)
GFR calc non Af Amer: >90 mL/min (ref >90)
GLUCOSE: 99 mg/dL (ref 70–99)
Potassium: 4.2 mmol/L (ref 3.5–5.1)
SODIUM: 135 mmol/L (ref 135–145)

## 2014-07-27 ENCOUNTER — Encounter (HOSPITAL_BASED_OUTPATIENT_CLINIC_OR_DEPARTMENT_OTHER)
Admission: RE | Admit: 2014-07-27 | Discharge: 2014-07-27 | Disposition: A | Discharge status: Home / Self Care | Payer: Medicaid Other | Source: Ambulatory Visit | Attending: Orthopedic Surgery | Admitting: Orthopedic Surgery

## 2014-07-27 NOTE — H&P (Signed)
[name]. Milling 07/01/2014 12:11 PM Location: [facility] Patient #: [id number] DOB: [birth year] Divorced / Language: [name] / Race: White Female  History of Present Illness [name] Klein MD; 07/01/2014 1:08 PM) Patient words: discuss panc. surgery.  The patient is a 59 year old female who presents with breast cancer. Patient is a 59 year old female who was diagnosed with right breast cancer in July 2015. She underwent neoadjuvant chemotherapy. Her tumor was 4.4 cm with a satellite lesion. There were no abnormal lymph nodes. This is an ER positive tumor, her-2 over expressed, c T3 N0 M0 stage IIa breast cancer. She has had multiple side effects from her chemotherapy. She had Hand-foot syndrome, dehydration, renal failure, bone pain, she has finally gotten to a stable point on maintenance Herceptin. Her MRI didn't show significant treatment response. Based on the location of her tumor in her satellite lesion, we did review this at multidisciplinary conference and recommend a mastectomy. She is slowly increasing her energy level. She is ready to have surgery.   Other Problems [name], [name]; 07/01/2014 12:12 PM) Anxiety Disorder Arthritis Back Pain Breast Cancer Depression Gastroesophageal Reflux Disease Hepatitis  Past Surgical History [name], [name]; 07/01/2014 12:12 PM) Breast Biopsy Right. Cesarean Section - 1 Hysterectomy (not due to cancer) - Partial Oral Surgery Tonsillectomy  Diagnostic Studies History [name], [name]; 07/01/2014 12:12 PM) Colonoscopy 1-5 years ago Mammogram within last year Pap Smear 1-5 years ago  Allergies [name], [name]; 07/01/2014 12:13 PM) Codeine Phosphate *ANALGESICS - OPIOID*  Medication History [name], [name]; 07/01/2014 12:15 PM) Oxycodone-Acetaminophen (5-325MG Tablet, Oral) Active. ALPRAZolam (0.5MG Tablet, Oral) Active. LORazepam (0.5MG Tablet, Oral) Active. Fluconazole (100MG Tablet, Oral)  Active. Lidocaine-Prilocaine (2.5-2.5% Cream, External) Active. Ondansetron HCl (8MG Tablet, Oral) Active. Sertraline HCl (50MG Tablet, Oral) Active. Triamcinolone Acetonide (0.1% Cream, External) Active. Nystatin (100000 UNIT/ML Suspension, Mouth/Throat) Active. Atenolol (50MG Tablet, Oral) Active. Dexamethasone (4MG Tablet, Oral) Active. Doxycycline Hyclate (100MG Tablet, Oral) Active. Prochlorperazine Maleate (10MG Tablet, Oral) Active. Levofloxacin (500MG Tablet, Oral) Active.  Social History [name], [name]; 07/01/2014 12:12 PM) Alcohol use Occasional alcohol use. Caffeine use Tea. No drug use Tobacco use Current every day smoker.  Family History [name], [name]; 07/01/2014 12:12 PM) Alcohol Abuse Father. Arthritis Father, Son. Cancer Father. Depression Sister. Diabetes Mellitus Sister. Hypertension Sister. Respiratory Condition Father.  Pregnancy / Birth History [name], [name]; 07/01/2014 12:12 PM) Age at menarche 42 years. Age of menopause 61-55 Gravida 1 Maternal age 52-25 Para 1  Review of Systems [name] [name]; 07/01/2014 12:12 PM) General Present- Appetite Loss, Chills, Fatigue and Weight Loss. Not Present- Fever, Night Sweats and Weight Gain. Skin Present- Dryness. Not Present- Change in Wart/Mole, Hives, Jaundice, New Lesions, Non-Healing Wounds, Rash and Ulcer. HEENT Present- Nose Bleed, Seasonal Allergies, Sinus Pain and Wears glasses/contact lenses. Not Present- Earache, Hearing Loss, Hoarseness, Oral Ulcers, Ringing in the Ears, Sore Throat, Visual Disturbances and Yellow Eyes. Respiratory Present- Chronic Cough and Snoring. Not Present- Bloody sputum, Difficulty Breathing and Wheezing. Cardiovascular Present- Palpitations and Rapid Heart Rate. Not Present- Chest Pain, Difficulty Breathing Lying Down, Leg Cramps, Shortness of Breath and Swelling of Extremities. Gastrointestinal Present- Chronic diarrhea, Nausea and Vomiting. Not  Present- Abdominal Pain, Bloating, Bloody Stool, Change in Bowel Habits, Constipation, Difficulty Swallowing, Excessive gas, Gets full quickly at meals, Hemorrhoids, Indigestion and Rectal Pain. Musculoskeletal Present- Back Pain, Joint Pain, Joint Stiffness and Muscle Pain. Not Present- Muscle Weakness and Swelling of Extremities. Neurological Present- Decreased Memory, Headaches and Tingling. Not Present-  Fainting, Numbness, Seizures, Tremor, Trouble walking and Weakness. Psychiatric Present- Anxiety. Not Present- Bipolar, Change in Sleep Pattern, Depression, Fearful and Frequent crying. Endocrine Present- Cold Intolerance. Not Present- Excessive Hunger, Hair Changes, Heat Intolerance, Hot flashes and New Diabetes.   Vitals [name] [name]; 07/01/2014 12:16 PM) 07/01/2014 12:15 PM Weight: 137 lb Height: 64in Body Surface Area: 1.68 m Body Mass Index: 23.52 kg/m Temp.: 96.49F  Pulse: 62 (Regular)  BP: 128/72 (Sitting, Left Arm, Standard)    Physical Exam [name] Klein MD; 07/01/2014 1:09 PM) General Mental Status-Alert. General Appearance-Consistent with stated age. Hydration-Well hydrated. Voice-Normal.  Head and Neck Head-normocephalic, atraumatic with no lesions or palpable masses.  Eye Sclera/Conjunctiva - Bilateral-No scleral icterus.  Chest and Lung Exam Chest and lung exam reveals -quiet, even and easy respiratory effort with no use of accessory muscles. Inspection Chest Wall - Normal. Back - normal.  Breast Note: No palpable masses in either breast. Breasts are symmetric and ptotic bilaterally. There is no nipple retraction or skin dimpling. There is no palpable axillary lymphadenopathy, supraclavicular or infraclavicular adenopathy on either side. There is no nipple discharge.   Cardiovascular Cardiovascular examination reveals -normal pedal pulses bilaterally. Note: regular rate and rhythm  Abdomen Inspection-Inspection  Normal. Palpation/Percussion Palpation and Percussion of the abdomen reveal - Soft, Non Tender, No Rebound tenderness, No Rigidity (guarding) and No hepatosplenomegaly.  Peripheral Vascular Upper Extremity Inspection - Bilateral - Normal - No Clubbing, No Cyanosis, No Edema, Pulses Intact. Lower Extremity Palpation - Edema - Bilateral - No edema.  Neurologic Neurologic evaluation reveals -alert and oriented x 3 with no impairment of recent or remote memory. Mental Status-Normal.  Musculoskeletal Global Assessment -Note: no gross deformities.  Normal Exam - Left-Upper Extremity Strength Normal and Lower Extremity Strength Normal. Normal Exam - Right-Upper Extremity Strength Normal and Lower Extremity Strength Normal.  Lymphatic Head & Neck  General Head & Neck Lymphatics: Bilateral - Description - Normal. Axillary  General Axillary Region: Bilateral - Description - Normal. Tenderness - Non Tender.    Assessment & Plan [name] Klein MD; 07/01/2014 1:17 PM) PRIMARY CANCER OF UPPER OUTER QUADRANT OF RIGHT FEMALE BREAST (174.4  C50.411) Impression: We will schedule the patient for a right mastectomy with sentinel lymph node biopsy. I will discuss with Drs. Lindi Adie and Pablo Ledger whether we would recommend a touch prep on her lymph nodes at the time of surgery. I certainly would prefer to do an axillary lymph node dissection at the time of her original surgery if this is desired. She would not necessarily fit into the Z11 study since she is having mastectomy. Based on her tumor size, she is recommended to receive radiation either way.  She is also anxious to get back to work and would prefer to condense things as much as possible.  I reviewed the risks of surgery. I discussed the risk of bleeding, infection, chronic pain, wound breakdown, possible need for additional surgery, possible need for prolonged drainage tubes, possible heart or lung complications. I gave her  educational material. She understands and wishes to proceed.  30 min spent in exam, evaluation, counseling, and coordination for care. Current Plans  Pt Education - Breast Removal (Mastectomy): cancer Schedule for Surgery   Signed by [name] Klein, MD (07/01/2014 1:19 PM)

## 2014-07-28 ENCOUNTER — Ambulatory Visit (HOSPITAL_BASED_OUTPATIENT_CLINIC_OR_DEPARTMENT_OTHER): Payer: Medicaid Other | Admitting: Certified Registered"

## 2014-07-28 ENCOUNTER — Encounter (HOSPITAL_COMMUNITY)
Admission: RE | Admit: 2014-07-28 | Discharge: 2014-07-28 | Disposition: A | Discharge status: Home / Self Care | Payer: Medicaid Other | Source: Ambulatory Visit | Attending: General Surgery | Admitting: General Surgery

## 2014-07-28 ENCOUNTER — Encounter (HOSPITAL_BASED_OUTPATIENT_CLINIC_OR_DEPARTMENT_OTHER): Payer: Self-pay | Admitting: Certified Registered"

## 2014-07-28 ENCOUNTER — Ambulatory Visit (HOSPITAL_BASED_OUTPATIENT_CLINIC_OR_DEPARTMENT_OTHER)
Admission: RE | Admit: 2014-07-28 | Discharge: 2014-07-29 | Disposition: A | Discharge status: Home / Self Care | Payer: Medicaid Other | Source: Ambulatory Visit | Attending: General Surgery | Admitting: General Surgery

## 2014-07-28 ENCOUNTER — Encounter (HOSPITAL_BASED_OUTPATIENT_CLINIC_OR_DEPARTMENT_OTHER)
Admission: RE | Disposition: A | Discharge status: Home / Self Care | Payer: Self-pay | Source: Ambulatory Visit | Attending: General Surgery

## 2014-07-28 DIAGNOSIS — K759 Inflammatory liver disease, unspecified: Secondary | ICD-10-CM | POA: Insufficient documentation

## 2014-07-28 DIAGNOSIS — C50919 Malignant neoplasm of unspecified site of unspecified female breast: Secondary | ICD-10-CM | POA: Diagnosis present

## 2014-07-28 DIAGNOSIS — M1990 Unspecified osteoarthritis, unspecified site: Secondary | ICD-10-CM | POA: Insufficient documentation

## 2014-07-28 DIAGNOSIS — K219 Gastro-esophageal reflux disease without esophagitis: Secondary | ICD-10-CM | POA: Insufficient documentation

## 2014-07-28 DIAGNOSIS — F419 Anxiety disorder, unspecified: Secondary | ICD-10-CM | POA: Insufficient documentation

## 2014-07-28 DIAGNOSIS — C50911 Malignant neoplasm of unspecified site of right female breast: Secondary | ICD-10-CM | POA: Insufficient documentation

## 2014-07-28 DIAGNOSIS — Z90710 Acquired absence of both cervix and uterus: Secondary | ICD-10-CM | POA: Insufficient documentation

## 2014-07-28 DIAGNOSIS — F329 Major depressive disorder, single episode, unspecified: Secondary | ICD-10-CM | POA: Insufficient documentation

## 2014-07-28 DIAGNOSIS — C50411 Malignant neoplasm of upper-outer quadrant of right female breast: Secondary | ICD-10-CM

## 2014-07-28 DIAGNOSIS — Z886 Allergy status to analgesic agent status: Secondary | ICD-10-CM | POA: Insufficient documentation

## 2014-07-28 DIAGNOSIS — F17210 Nicotine dependence, cigarettes, uncomplicated: Secondary | ICD-10-CM | POA: Insufficient documentation

## 2014-07-28 HISTORY — DX: Malignant (primary) neoplasm, unspecified: C80.1

## 2014-07-28 HISTORY — PX: MASTECTOMY W/ SENTINEL NODE BIOPSY: SHX2001

## 2014-07-28 SURGERY — MASTECTOMY WITH SENTINEL LYMPH NODE BIOPSY
Anesthesia: General | Site: Breast | Laterality: Right

## 2014-07-28 MED ORDER — ONDANSETRON HCL 4 MG/2ML IJ SOLN
INTRAMUSCULAR | Status: DC | PRN
  Administered 2014-07-28: 4 mg via INTRAVENOUS

## 2014-07-28 MED ORDER — SODIUM CHLORIDE 0.9 % IV SOLN
INTRAVENOUS | Status: DC | PRN
  Administered 2014-07-28: 120 mL via INTRAMUSCULAR

## 2014-07-28 MED ORDER — SERTRALINE HCL 50 MG PO TABS
50.0000 mg | ORAL_TABLET | Freq: Every day | ORAL | Status: DC
  Administered 2014-07-28: 25 mg via ORAL

## 2014-07-28 MED ORDER — OXYCODONE HCL 5 MG PO TABS: 5.0000 mg | ORAL_TABLET | Freq: Once | ORAL | Status: AC | PRN

## 2014-07-28 MED ORDER — ONDANSETRON HCL 4 MG/2ML IJ SOLN: 4.0000 mg | Freq: Four times a day (QID) | INTRAMUSCULAR | Status: DC | PRN

## 2014-07-28 MED ORDER — HYDROMORPHONE HCL 1 MG/ML IJ SOLN
0.2500 mg | INTRAMUSCULAR | Status: DC | PRN
  Administered 2014-07-28 (×2): 0.5 mg via INTRAVENOUS

## 2014-07-28 MED ORDER — FENTANYL CITRATE 0.05 MG/ML IJ SOLN
INTRAMUSCULAR | Status: AC
  Filled 2014-07-28: qty 2

## 2014-07-28 MED ORDER — EPHEDRINE SULFATE 50 MG/ML IJ SOLN
INTRAMUSCULAR | Status: DC | PRN
  Administered 2014-07-28: 10 mg via INTRAVENOUS

## 2014-07-28 MED ORDER — CEFAZOLIN SODIUM 1-5 GM-% IV SOLN
1.0000 g | Freq: Four times a day (QID) | INTRAVENOUS | Status: DC
  Administered 2014-07-28 – 2014-07-29 (×2): 1 g via INTRAVENOUS

## 2014-07-28 MED ORDER — PROPOFOL 10 MG/ML IV BOLUS
INTRAVENOUS | Status: DC | PRN
  Administered 2014-07-28: 300 mg via INTRAVENOUS

## 2014-07-28 MED ORDER — BUPIVACAINE-EPINEPHRINE (PF) 0.5% -1:200000 IJ SOLN
INTRAMUSCULAR | Status: DC | PRN
  Administered 2014-07-28: 25 mL via PERINEURAL

## 2014-07-28 MED ORDER — DEXAMETHASONE SODIUM PHOSPHATE 4 MG/ML IJ SOLN
INTRAMUSCULAR | Status: DC | PRN
  Administered 2014-07-28: 10 mg via INTRAVENOUS

## 2014-07-28 MED ORDER — HYDROMORPHONE HCL 1 MG/ML IJ SOLN
INTRAMUSCULAR | Status: AC
  Filled 2014-07-28: qty 1

## 2014-07-28 MED ORDER — LORAZEPAM 1 MG PO TABS
1.0000 mg | ORAL_TABLET | Freq: Every day | ORAL | Status: DC
  Administered 2014-07-28: 1 mg via ORAL
  Filled 2014-07-28: qty 1

## 2014-07-28 MED ORDER — CEFAZOLIN SODIUM-DEXTROSE 2-3 GM-% IV SOLR
INTRAVENOUS | Status: AC
  Filled 2014-07-28: qty 50

## 2014-07-28 MED ORDER — KCL IN DEXTROSE-NACL 20-5-0.45 MEQ/L-%-% IV SOLN
INTRAVENOUS | Status: AC
  Administered 2014-07-28: 17:00:00 via INTRAVENOUS
  Filled 2014-07-28: qty 1000

## 2014-07-28 MED ORDER — METHYLENE BLUE 1 % INJ SOLN
INTRAMUSCULAR | Status: AC
  Filled 2014-07-28: qty 10

## 2014-07-28 MED ORDER — FENTANYL CITRATE 0.05 MG/ML IJ SOLN
INTRAMUSCULAR | Status: AC
  Filled 2014-07-28: qty 6

## 2014-07-28 MED ORDER — TECHNETIUM TC 99M SULFUR COLLOID FILTERED
1.0000 | Freq: Once | INTRAVENOUS | Status: AC | PRN
  Administered 2014-07-28: 1 via INTRADERMAL

## 2014-07-28 MED ORDER — LIDOCAINE HCL (CARDIAC) 20 MG/ML IV SOLN
INTRAVENOUS | Status: DC | PRN
  Administered 2014-07-28: 100 mg via INTRAVENOUS

## 2014-07-28 MED ORDER — MIDAZOLAM HCL 2 MG/2ML IJ SOLN
INTRAMUSCULAR | Status: AC
Start: 2014-07-28 — End: 2014-07-28
  Filled 2014-07-28: qty 2

## 2014-07-28 MED ORDER — OXYCODONE-ACETAMINOPHEN 5-325 MG PO TABS
1.0000 | ORAL_TABLET | ORAL | Status: DC | PRN
  Administered 2014-07-28 (×2): 2 via ORAL
  Administered 2014-07-29: 1 via ORAL
  Administered 2014-07-29: 2 via ORAL
  Filled 2014-07-28: qty 2
  Filled 2014-07-28: qty 1
  Filled 2014-07-28 (×2): qty 2

## 2014-07-28 MED ORDER — METHYLENE BLUE 1 % INJ SOLN
INTRAMUSCULAR | Status: DC | PRN
  Administered 2014-07-28: 2 mL via SUBMUCOSAL

## 2014-07-28 MED ORDER — CEFAZOLIN SODIUM-DEXTROSE 2-3 GM-% IV SOLR
2.0000 g | INTRAVENOUS | Status: AC
  Administered 2014-07-28: 2 g via INTRAVENOUS

## 2014-07-28 MED ORDER — OXYCODONE-ACETAMINOPHEN 5-325 MG PO TABS: 1.0000 | ORAL_TABLET | ORAL | Status: DC | PRN

## 2014-07-28 MED ORDER — MIDAZOLAM HCL 2 MG/2ML IJ SOLN
1.0000 mg | INTRAMUSCULAR | Status: DC | PRN
  Administered 2014-07-28: 2 mg via INTRAVENOUS

## 2014-07-28 MED ORDER — FENTANYL CITRATE 0.05 MG/ML IJ SOLN
INTRAMUSCULAR | Status: DC | PRN
Start: 2014-07-28 — End: 2014-07-28
  Administered 2014-07-28 (×3): 50 ug via INTRAVENOUS
  Administered 2014-07-28: 100 ug via INTRAVENOUS
  Administered 2014-07-28: 50 ug via INTRAVENOUS

## 2014-07-28 MED ORDER — MIDAZOLAM HCL 2 MG/2ML IJ SOLN
INTRAMUSCULAR | Status: AC
  Filled 2014-07-28: qty 2

## 2014-07-28 MED ORDER — MORPHINE SULFATE 2 MG/ML IJ SOLN
1.0000 mg | INTRAMUSCULAR | Status: DC | PRN
  Administered 2014-07-29: 4 mg via INTRAVENOUS
  Filled 2014-07-28: qty 2

## 2014-07-28 MED ORDER — ONDANSETRON HCL 4 MG PO TABS: 4.0000 mg | ORAL_TABLET | Freq: Four times a day (QID) | ORAL | Status: DC | PRN

## 2014-07-28 MED ORDER — ONDANSETRON HCL 4 MG PO TABS: 4.0000 mg | ORAL_TABLET | Freq: Three times a day (TID) | ORAL | Status: DC | PRN

## 2014-07-28 MED ORDER — LACTATED RINGERS IV SOLN
INTRAVENOUS | Status: DC
  Administered 2014-07-28 (×2): via INTRAVENOUS

## 2014-07-28 MED ORDER — POTASSIUM CHLORIDE CRYS ER 20 MEQ PO TBCR: 20.0000 meq | EXTENDED_RELEASE_TABLET | Freq: Two times a day (BID) | ORAL | Status: DC

## 2014-07-28 MED ORDER — ATENOLOL 50 MG PO TABS
50.0000 mg | ORAL_TABLET | Freq: Two times a day (BID) | ORAL | Status: DC
  Administered 2014-07-28: 25 mg via ORAL

## 2014-07-28 MED ORDER — FENTANYL CITRATE 0.05 MG/ML IJ SOLN
50.0000 ug | INTRAMUSCULAR | Status: DC | PRN
  Administered 2014-07-28 (×2): 100 ug via INTRAVENOUS

## 2014-07-28 MED ORDER — ALPRAZOLAM 0.5 MG PO TABS
0.5000 mg | ORAL_TABLET | Freq: Two times a day (BID) | ORAL | Status: DC | PRN
  Administered 2014-07-28: 0.5 mg via ORAL
  Filled 2014-07-28: qty 2

## 2014-07-28 MED ORDER — CEFAZOLIN SODIUM 1-5 GM-% IV SOLN
INTRAVENOUS | Status: AC
  Filled 2014-07-28: qty 50

## 2014-07-28 MED ORDER — ONDANSETRON HCL 4 MG/2ML IJ SOLN: 4.0000 mg | Freq: Once | INTRAMUSCULAR | Status: AC | PRN

## 2014-07-28 MED ORDER — OXYCODONE HCL 5 MG/5ML PO SOLN: 5.0000 mg | Freq: Once | ORAL | Status: AC | PRN

## 2014-07-28 MED ORDER — MIDAZOLAM HCL 5 MG/5ML IJ SOLN
INTRAMUSCULAR | Status: DC | PRN
  Administered 2014-07-28: 2 mg via INTRAVENOUS

## 2014-07-28 SURGICAL SUPPLY — 74 items
BAG DECANTER FOR FLEXI CONT (MISCELLANEOUS) ×3 IMPLANT
BINDER BREAST LRG (GAUZE/BANDAGES/DRESSINGS) ×2 IMPLANT
BINDER BREAST MEDIUM (GAUZE/BANDAGES/DRESSINGS) IMPLANT
BINDER BREAST XLRG (GAUZE/BANDAGES/DRESSINGS) IMPLANT
BINDER BREAST XXLRG (GAUZE/BANDAGES/DRESSINGS) IMPLANT
BLADE HEX COATED 2.75 (ELECTRODE) ×3 IMPLANT
BLADE SURG 10 STRL SS (BLADE) ×3 IMPLANT
BLADE SURG 15 STRL LF DISP TIS (BLADE) ×1 IMPLANT
BLADE SURG 15 STRL SS (BLADE) ×3
BNDG COHESIVE 4X5 TAN STRL (GAUZE/BANDAGES/DRESSINGS) ×2 IMPLANT
CANISTER SUCT 1200ML W/VALVE (MISCELLANEOUS) ×3 IMPLANT
CHLORAPREP W/TINT 26ML (MISCELLANEOUS) ×3 IMPLANT
CLIP TI LARGE 6 (CLIP) IMPLANT
CLIP TI MEDIUM 6 (CLIP) ×8 IMPLANT
CLIP TI WIDE RED SMALL 6 (CLIP) IMPLANT
CLOSURE WOUND 1/2 X4 (GAUZE/BANDAGES/DRESSINGS) ×1
COVER MAYO STAND STRL (DRAPES) ×3 IMPLANT
COVER PROBE W GEL 5X96 (DRAPES) ×3 IMPLANT
DECANTER SPIKE VIAL GLASS SM (MISCELLANEOUS) IMPLANT
DEVICE DISSECT PLASMABLAD 3.0S (MISCELLANEOUS) ×1 IMPLANT
DRAIN CHANNEL 19F RND (DRAIN) ×3 IMPLANT
DRAPE UTILITY XL STRL (DRAPES) ×6 IMPLANT
DRSG PAD ABDOMINAL 8X10 ST (GAUZE/BANDAGES/DRESSINGS) ×6 IMPLANT
ELECT REM PT RETURN 9FT ADLT (ELECTROSURGICAL) ×3
ELECTRODE REM PT RTRN 9FT ADLT (ELECTROSURGICAL) ×1 IMPLANT
EVACUATOR SILICONE 100CC (DRAIN) ×3 IMPLANT
GAUZE SPONGE 4X4 12PLY STRL (GAUZE/BANDAGES/DRESSINGS) ×3 IMPLANT
GLOVE BIO SURGEON STRL SZ 6 (GLOVE) ×3 IMPLANT
GLOVE BIO SURGEON STRL SZ 6.5 (GLOVE) ×1 IMPLANT
GLOVE BIO SURGEONS STRL SZ 6.5 (GLOVE) ×1
GLOVE BIOGEL PI IND STRL 6.5 (GLOVE) ×1 IMPLANT
GLOVE BIOGEL PI IND STRL 7.0 (GLOVE) IMPLANT
GLOVE BIOGEL PI INDICATOR 6.5 (GLOVE) ×2
GLOVE BIOGEL PI INDICATOR 7.0 (GLOVE) ×2
GLOVE ECLIPSE 6.5 STRL STRAW (GLOVE) ×4 IMPLANT
GOWN STRL REUS W/ TWL LRG LVL3 (GOWN DISPOSABLE) ×1 IMPLANT
GOWN STRL REUS W/TWL 2XL LVL3 (GOWN DISPOSABLE) ×3 IMPLANT
GOWN STRL REUS W/TWL LRG LVL3 (GOWN DISPOSABLE) ×3
LIQUID BAND (GAUZE/BANDAGES/DRESSINGS) ×3 IMPLANT
NDL HYPO 25X1 1.5 SAFETY (NEEDLE) ×2 IMPLANT
NDL SAFETY ECLIPSE 18X1.5 (NEEDLE) ×1 IMPLANT
NDL SPNL 18GX3.5 QUINCKE PK (NEEDLE) ×1 IMPLANT
NDL SPNL 22GX3.5 QUINCKE BK (NEEDLE) IMPLANT
NEEDLE HYPO 18GX1.5 SHARP (NEEDLE) ×3
NEEDLE HYPO 25X1 1.5 SAFETY (NEEDLE) ×3 IMPLANT
NEEDLE SPNL 18GX3.5 QUINCKE PK (NEEDLE) ×3 IMPLANT
NEEDLE SPNL 22GX3.5 QUINCKE BK (NEEDLE) IMPLANT
NS IRRIG 1000ML POUR BTL (IV SOLUTION) ×1 IMPLANT
PACK BASIN DAY SURGERY FS (CUSTOM PROCEDURE TRAY) ×3 IMPLANT
PACK UNIVERSAL I (CUSTOM PROCEDURE TRAY) ×3 IMPLANT
PENCIL BUTTON HOLSTER BLD 10FT (ELECTRODE) ×3 IMPLANT
PIN SAFETY STERILE (MISCELLANEOUS) ×3 IMPLANT
PLASMABLADE 3.0S (MISCELLANEOUS) ×3
SLEEVE SCD COMPRESS KNEE MED (MISCELLANEOUS) ×3 IMPLANT
SPONGE LAP 18X18 X RAY DECT (DISPOSABLE) ×7 IMPLANT
STAPLER VISISTAT 35W (STAPLE) IMPLANT
STOCKINETTE IMPERVIOUS LG (DRAPES) ×3 IMPLANT
STRIP CLOSURE SKIN 1/2X4 (GAUZE/BANDAGES/DRESSINGS) ×2 IMPLANT
SUT ETHILON 2 0 FS 18 (SUTURE) IMPLANT
SUT MNCRL AB 4-0 PS2 18 (SUTURE) ×4 IMPLANT
SUT SILK 0 TIES 10X30 (SUTURE) ×3 IMPLANT
SUT SILK 2 0 SH (SUTURE) IMPLANT
SUT VICRYL 3-0 CR8 SH (SUTURE) ×8 IMPLANT
SUT VICRYL 4-0 PS2 18IN ABS (SUTURE) ×12 IMPLANT
SUT VICRYL AB 2 0 TIE (SUTURE) ×1 IMPLANT
SUT VICRYL AB 2 0 TIES (SUTURE) ×3
SYR 50ML LL SCALE MARK (SYRINGE) ×3 IMPLANT
SYR CONTROL 10ML LL (SYRINGE) ×3 IMPLANT
TOWEL OR 17X24 6PK STRL BLUE (TOWEL DISPOSABLE) ×3 IMPLANT
TOWEL OR NON WOVEN STRL DISP B (DISPOSABLE) ×3 IMPLANT
TUBE CONNECTING 20'X1/4 (TUBING) ×1
TUBE CONNECTING 20X1/4 (TUBING) ×2 IMPLANT
UNDERPAD 30X30 INCONTINENT (UNDERPADS AND DIAPERS) ×1 IMPLANT
YANKAUER SUCT BULB TIP NO VENT (SUCTIONS) ×3 IMPLANT

## 2014-07-28 NOTE — Interval H&P Note (Signed)
History and Physical Interval Note:  07/28/2014 1:17 PM  [name]  has presented today for surgery, with the diagnosis of RIGHT BREAST CANCER  The various methods of treatment have been discussed with the patient and family. After consideration of risks, benefits and other options for treatment, the patient has consented to  Procedure(s): RIGHT MASTECTOMY WITH SENTINEL LYMPH NODE BIOPSY AND POSSIBLE LYMPH NODE DISSECTION (Right) as a surgical intervention .  The patient's history has been reviewed, patient examined, no change in status, stable for surgery.  I have reviewed the patient's chart and labs.  Questions were answered to the patient's satisfaction.     Griselle Rufer

## 2014-07-28 NOTE — Anesthesia Preprocedure Evaluation (Addendum)
Anesthesia Evaluation  Patient identified by MRN, date of birth, ID band Patient awake    Reviewed: Allergy & Precautions, NPO status , Patient's Chart, lab work & pertinent test results  Airway Mallampati: II  TM Distance: >3 FB Neck ROM: Full    Dental  (+) Teeth Intact, Dental Advisory Given   Pulmonary Current Smoker,  breath sounds clear to auscultation        Cardiovascular Rhythm:Regular Rate:Normal     Neuro/Psych    GI/Hepatic GERD-  Medicated and Controlled,(+)     Substance abuse: Received Interferon treatment with good result.  ,   Endo/Other    Renal/GU      Musculoskeletal   Abdominal   Peds  Hematology   Anesthesia Other Findings   Reproductive/Obstetrics                            Anesthesia Physical Anesthesia Plan  ASA: II  Anesthesia Plan: General   Post-op Pain Management: MAC Combined w/ Regional for Post-op pain   Induction: Intravenous  Airway Management Planned: LMA  Additional Equipment:   Intra-op Plan:   Post-operative Plan: Extubation in OR  Informed Consent: I have reviewed the patients History and Physical, chart, labs and discussed the procedure including the risks, benefits and alternatives for the proposed anesthesia with the patient or authorized representative who has indicated his/her understanding and acceptance.   Dental advisory given  Plan Discussed with: CRNA, Anesthesiologist and Surgeon  Anesthesia Plan Comments:         Anesthesia Quick Evaluation

## 2014-07-28 NOTE — Anesthesia Postprocedure Evaluation (Signed)
  Anesthesia Post-op Note  Patient: [name]  Procedure(s) Performed: Procedure(s): RIGHT MASTECTOMY WITH SENTINEL LYMPH NODE BIOPSY  (Right)  Patient Location: PACU  Anesthesia Type:General  Level of Consciousness: awake, alert , oriented and patient cooperative  Airway and Oxygen Therapy: Patient Spontanous Breathing  Post-op Pain: mild  Post-op Assessment: Post-op Vital signs reviewed, Patient's Cardiovascular Status Stable, Respiratory Function Stable, Patent Airway and No signs of Nausea or vomiting  Post-op Vital Signs: stable  Last Vitals:  Filed Vitals:   07/28/14 1607  BP:   Pulse: 78  Temp:   Resp: 15    Complications: No apparent anesthesia complications

## 2014-07-28 NOTE — Op Note (Signed)
Right Mastectomy with Sentinel Node Biopsy Procedure Note  Indications: This patient presents with history of right breast cancer with clinically negative axillary lymph node exam.  Pre-operative Diagnosis: right breast cancer, cT2N0M0, IIA  Post-operative Diagnosis: right breast cancer, same  Surgeon: [name]   Anesthesia: General endotracheal anesthesia and Local anesthesia 0.25.% bupivacaine, with epinephrine  ASA Class: 2  Procedure Details  The patient was seen in the Holding Room. The risks, benefits, complications, treatment options, and expected outcomes were discussed with the patient. The possibilities of reaction to medication, pulmonary aspiration, bleeding, infection, the need for additional procedures, failure to diagnose a condition, and creating a complication requiring transfusion or operation were discussed with the patient. The patient concurred with the proposed plan, giving informed consent.  The site of surgery properly noted/marked. The patient was taken to Operating Room # 5, identified as [name] and the procedure verified as Right Mastectomy and Sentinel Node Biopsy. A Time Out was held and the above information confirmed.  The methylene blue was injected in the subareolar location.    After induction of anesthesia, the right arm, breast, and chest were prepped and draped in standard fashion.   The borders of the breast were identified and marked.  The incisions of the breast were drawn out to make sure incision lines were equidistant in length.  The superior incision was made with the #10 blade.  Saline mixture was infiltrated into the superior flap.  Mastectomy hooks were used to provide elevation of the skin edges, and the curved Mayo scissors were used to create the mastectomy flaps.  The dissection was taken to the fascia of the pectoralis major.  The penetrating vessels were clipped.  The superior flap was taken medially to the lateral sternal border,  superiorly to the inferior border of the clavicle.  The inferior flap was similarly created, inferiorly to the inframammary fold and laterally to the border of the latissimus.  The breast was taken off including the pectoralis fascia and the axillary tail marked.    Using a hand-held gamma probe, axillary sentinel nodes were identified.  Three level 2 axillary sentinel nodes were removed and submitted to pathology.  The findings are below.  The lymphovascular channels were clipped with metal clips.    These were sent for frozen section.    The wound was irrigated.  One19 Blake drains was placed laterally.   Hemostasis was achieved with cautery.  The wound was closed with a 3-0 Vicryl deep dermal interrupted sutures and 4-0 Vicryl subcuticular closure in layers.    Sterile dressings were applied. At the end of the operation, all sponge, instrument, and needle counts were correct.  Findings: grossly clear surgical margins, SLN #1 hot/blue cps 210, SLN #2 hot/blue cps 150, SLN #3 palpable  Estimated Blood Loss:  less than 50 mL         Drains: One 19 Fr Blake drain                Specimens: R breast and 3 axillary sentinel nodes         Complications:  None; patient tolerated the procedure well.         Disposition: PACU - hemodynamically stable.         Condition: stable

## 2014-07-28 NOTE — Transfer of Care (Signed)
Immediate Anesthesia Transfer of Care Note  Patient: [name]  Procedure(s) Performed: Procedure(s): RIGHT MASTECTOMY WITH SENTINEL LYMPH NODE BIOPSY  (Right)  Patient Location: PACU  Anesthesia Type:GA combined with regional for post-op pain  Level of Consciousness: awake, patient cooperative and confused  Airway & Oxygen Therapy: Patient Spontanous Breathing and Patient connected to face mask oxygen  Post-op Assessment: Report given to RN and Post -op Vital signs reviewed and stable  Post vital signs: Reviewed and stable  Last Vitals:  Filed Vitals:   07/28/14 1220  BP:   Pulse: 85  Temp:   Resp: 21    Complications: No apparent anesthesia complications

## 2014-07-28 NOTE — Anesthesia Procedure Notes (Addendum)
Anesthesia Regional Block:  Pectoralis block  Pre-Anesthetic Checklist: ,, timeout performed, Correct Patient, Correct Site, Correct Laterality, Correct Procedure, Correct Position, site marked, Risks and benefits discussed,  Surgical consent,  Pre-op evaluation,  At surgeon's request and post-op pain management  Laterality: Right and Upper  Prep: chloraprep       Needles:  Injection technique: Single-shot  Needle Type: Echogenic Needle     Needle Length: 9cm 9 cm Needle Gauge: 21 and 21 G    Additional Needles:  Procedures: ultrasound guided (picture in chart) Pectoralis block Narrative:  Start time: 07/28/2014 12:12 PM End time: 07/28/2014 12:17 PM Injection made incrementally with aspirations every 5 mL.  Performed by: Personally  Anesthesiologist: Koray Soter

## 2014-07-29 ENCOUNTER — Ambulatory Visit: Payer: Medicaid Other

## 2014-07-29 ENCOUNTER — Other Ambulatory Visit: Payer: Medicaid Other

## 2014-07-29 DIAGNOSIS — C50911 Malignant neoplasm of unspecified site of right female breast: Secondary | ICD-10-CM | POA: Diagnosis not present

## 2014-07-29 MED ORDER — CEFAZOLIN SODIUM 1-5 GM-% IV SOLN
INTRAVENOUS | Status: AC
  Filled 2014-07-29: qty 50

## 2014-07-29 NOTE — Discharge Instructions (Signed)
[facility] °[phone] ° °MASTECTOMY: POST OP INSTRUCTIONS ° °Always review your discharge instruction sheet given to you by the facility where your surgery was performed. °IF YOU HAVE DISABILITY OR FAMILY LEAVE FORMS, YOU MUST BRING THEM TO THE OFFICE FOR PROCESSING.   °DO NOT GIVE THEM TO YOUR DOCTOR. °A prescription for pain medication may be given to you upon discharge.  Take your pain medication as prescribed, if needed.  If narcotic pain medicine is not needed, then you may take acetaminophen (Tylenol) or ibuprofen (Advil) as needed. °1. Take your usually prescribed medications unless otherwise directed. °2. If you need a refill on your pain medication, please contact your pharmacy.  They will contact our office to request authorization.  Prescriptions will not be filled after 5pm or on week-ends. °3. You should follow a light diet the first few days after arrival home, such as soup and crackers, etc.  Resume your normal diet the day after surgery. °4. Most patients will experience some swelling and bruising on the chest and underarm.  Ice packs will help.  Swelling and bruising can take several days to resolve.  °5. It is common to experience some constipation if taking pain medication after surgery.  Increasing fluid intake and taking a stool softener (such as Colace) will usually help or prevent this problem from occurring.  A mild laxative (Milk of Magnesia or Miralax) should be taken according to package instructions if there are no bowel movements after 48 hours. °6. Unless discharge instructions indicate otherwise, leave your bandage dry and in place until your next appointment in 3-5 days.  You may take a limited sponge bath.  No tube baths or showers until the drains are removed.  You may have steri-strips (small skin tapes) in place directly over the incision.  These strips should be left on the skin for 7-10 days.  If your surgeon used skin glue on the incision, you may  shower in 24 hours.  The glue will flake off over the next 2-3 weeks.  Any sutures or staples will be removed at the office during your follow-up visit. °7. DRAINS:  If you have drains in place, it is important to keep a list of the amount of drainage produced each day in your drains.  Before leaving the hospital, you should be instructed on drain care.  Call our office if you have any questions about your drains. °8. ACTIVITIES:  You may resume regular (light) daily activities beginning the next day--such as daily self-care, walking, climbing stairs--gradually increasing activities as tolerated.  You may have sexual intercourse when it is comfortable.  Refrain from any heavy lifting or straining until approved by your doctor. °a. You may drive when you are no longer taking prescription pain medication, you can comfortably wear a seatbelt, and you can safely maneuver your car and apply brakes. °b. RETURN TO WORK:  __________________________________________________________ °9. You should see your doctor in the office for a follow-up appointment approximately 3-5 days after your surgery.  Your doctor’s nurse will typically make your follow-up appointment when she calls you with your pathology report.  Expect your pathology report 2-3 business days after your surgery.  You may call to check if you do not hear from us after three days.   °10. OTHER INSTRUCTIONS: ______________________________________________________________________________________________ ____________________________________________________________________________________________ ° °WHEN TO CALL YOUR DOCTOR: °1. Fever over 101.0 °2. Nausea and/or vomiting °3. Extreme swelling or bruising °4. Continued bleeding from incision. °5. Increased pain, redness, or drainage from the   incision. ° °The clinic staff is available to answer your questions during regular business hours.  Please don’t hesitate to call and ask to speak to one of the nurses for clinical  concerns.  If you have a medical emergency, go to the nearest emergency room or call 911.  A surgeon from [facility] is always on call at the hospital. °[address], [address], [place], [place]  [address] ? [address] [address], [place], [place]   [address] °[phone] ? [phone] ? FAX [phone] °Web site: www.[facility] ° °About my Jackson-Pratt Bulb Drain ° °What is a Jackson-Pratt bulb? °A Jackson-Pratt is a soft, round device used to collect drainage. It is connected to a long, thin drainage catheter, which is held in place by one or two small stiches near your surgical incision site. When the bulb is squeezed, it forms a vacuum, forcing the drainage to empty into the bulb. ° °Emptying the Jackson-Pratt bulb- °To empty the bulb: °1. Release the plug on the top of the bulb. °2. Pour the bulb's contents into a measuring container which your nurse will provide. °3. Record the time emptied and amount of drainage. Empty the drain(s) as often as your     doctor or nurse recommends. ° °Date                  Time                    Amount (Drain 1)                 Amount (Drain 2) ° °_____________________________________________________________________ ° °_____________________________________________________________________ ° °_____________________________________________________________________ ° °_____________________________________________________________________ ° °_____________________________________________________________________ ° °_____________________________________________________________________ ° °_____________________________________________________________________ ° °_____________________________________________________________________ ° °Squeezing the Jackson-Pratt Bulb- °To squeeze the bulb: °1. Make sure the plug at the top of the bulb is open. °2. Squeeze the bulb tightly in your fist. You will hear air squeezing from the bulb. °3. Replace the plug while the bulb is squeezed. °4.  Use a safety pin to attach the bulb to your clothing. This will keep the catheter from     pulling at the bulb insertion site. ° °When to call your doctor- °Call your doctor if: °· Drain site becomes red, swollen or hot. °· You have a fever greater than 101 degrees F. °· There is oozing at the drain site. °· Drain falls out (apply a guaze bandage over the drain hole and secure it with tape). °· Drainage increases daily not related to activity patterns. (You will usually have more drainage when you are active than when you are resting.) °· Drainage has a bad odor. ° °

## 2014-08-01 ENCOUNTER — Encounter (HOSPITAL_BASED_OUTPATIENT_CLINIC_OR_DEPARTMENT_OTHER): Payer: Self-pay | Admitting: General Surgery

## 2014-08-01 NOTE — Progress Notes (Signed)
Quick Note:  Please let patient know that margins and lymph nodes are negative. ______

## 2014-08-03 ENCOUNTER — Telehealth: Payer: Self-pay | Admitting: *Deleted

## 2014-08-03 ENCOUNTER — Other Ambulatory Visit: Payer: Self-pay | Admitting: *Deleted

## 2014-08-03 DIAGNOSIS — C50411 Malignant neoplasm of upper-outer quadrant of right female breast: Secondary | ICD-10-CM

## 2014-08-03 MED ORDER — OXYCODONE-ACETAMINOPHEN 5-325 MG PO TABS
1.0000 | ORAL_TABLET | Freq: Three times a day (TID) | ORAL | Status: DC | PRN
Start: 2014-08-03 — End: 2014-08-31

## 2014-08-03 NOTE — Telephone Encounter (Signed)
Discussed with Dr. Lindi Adie and patient advised to call Dr. Marlowe Aschoff office. Called patient and cellphone goes directly to voicemail. Left message for patient to call Dr. Marlowe Aschoff office to discuss pain meds.

## 2014-08-03 NOTE — Telephone Encounter (Signed)
Patient called requesting refill of Roxicet.  She says she is taking 2 tabs Q 4 hours and runs out today.  She had a mastectomy last week and is requiring this medication for post-surgical pain.

## 2014-08-03 NOTE — Telephone Encounter (Signed)
Patient called clinic wanting the original Percocet prescription filled that Dr. Lindi Adie prescribed for her. Patient had this filled on 07/21/14 for 90 pills plus a prescription for Roxicet 60 pills from Dr. Barry Dienes on 07/28/14. When I informed patient that this would not be refilled until the 3rd of next month patient stated that we were filling it every 9-10 days. Discussed with Dr. Lindi Adie and prescription refilled with doseage change to 3 times daily prn.

## 2014-08-03 NOTE — Telephone Encounter (Signed)
Opened addendum in error 

## 2014-08-05 ENCOUNTER — Other Ambulatory Visit: Payer: Medicaid Other

## 2014-08-05 ENCOUNTER — Ambulatory Visit (HOSPITAL_BASED_OUTPATIENT_CLINIC_OR_DEPARTMENT_OTHER): Payer: Medicaid Other | Admitting: Hematology and Oncology

## 2014-08-05 ENCOUNTER — Telehealth: Payer: Self-pay | Admitting: Hematology and Oncology

## 2014-08-05 ENCOUNTER — Ambulatory Visit: Payer: Medicaid Other

## 2014-08-05 VITALS — BP 118/76 | HR 73 | Temp 97.5°F | Resp 18 | Ht 64.0 in | Wt 135.8 lb

## 2014-08-05 DIAGNOSIS — Z853 Personal history of malignant neoplasm of breast: Secondary | ICD-10-CM

## 2014-08-05 DIAGNOSIS — Z79810 Long term (current) use of selective estrogen receptor modulators (SERMs): Secondary | ICD-10-CM

## 2014-08-05 DIAGNOSIS — C50411 Malignant neoplasm of upper-outer quadrant of right female breast: Secondary | ICD-10-CM

## 2014-08-05 MED ORDER — ANASTROZOLE 1 MG PO TABS: 1.0000 mg | ORAL_TABLET | Freq: Every day | ORAL | Status: DC

## 2014-08-05 NOTE — Telephone Encounter (Signed)
Appointments made and avs printed for patient °

## 2014-08-05 NOTE — Assessment & Plan Note (Addendum)
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 Status post neoadjuvant chemotherapy with TCHPx2, GCHPx1, CHPx1, HPx2 , complete pathologic response with only residual DCIS 7 cm remaining.  Pathologic counseling: I discussed the final pathology report showing complete pathologic response to neoadjuvant therapy with only residual DCIS remaining. Patient is ecstatic during this result.  Recommendation: 1. Continue maintenance Herceptin 2. Start antiestrogen therapy with anastrozole 1 mg daily from 08/19/2014  Anastrozole counseling: We discussed the risks and benefits of anti-estrogen therapy with aromatase inhibitors. These include but not limited to insomnia, hot flashes, mood changes, vaginal dryness, bone density loss, and weight gain. Although rare, serious side effects including endometrial cancer, risk of blood clots were also discussed. We strongly believe that the benefits far outweigh the risks. Patient understands these risks and consented to starting treatment. Planned treatment duration is 5 years.  Return to clinic every 6 weeks for follow-up on Herceptin and anastrozole

## 2014-08-05 NOTE — Progress Notes (Signed)
Patient Care Team: Darreld Mclean, MD as PCP - General (Family Medicine) Nicholas Lose, MD as Consulting Physician (Hematology and Oncology) [name], MD as Consulting Physician (General Surgery) Thea Silversmith, MD as Consulting Physician (Radiation Oncology)  DIAGNOSIS: Breast cancer of upper-outer quadrant of right female breast   Staging form: Breast, AJCC 7th Edition     Clinical: Stage IIA (T2, N0, cM0) - Unsigned       Staging comments: Staged at breast conference 12/29/13.      Pathologic stage from 08/01/2014: Stage 0 (yTis (DCIS), N0, cM0) - Unsigned       Staging comments: Staged on final mastectomy specimen by Dr. Donato Heinz    SUMMARY OF ONCOLOGIC HISTORY:   Breast cancer of upper-outer quadrant of right female breast   12/16/2013 Mammogram 1. A palpable 4.1 cm diameter mass/asymmetry with associated coarse heterogeneous microcalcifications at 11 o'clock 2 cm from the right nipple demonstrates mammographic and sonographic features highly suggestive of malignancy.    12/16/2013 Initial Biopsy Breast cancer of upper-outer quadrant of right female breast; Invasive Ductal Cancer Er 100%, PR: 0%; Her2 Positive Ratio 4.28: Ki 67:24%;    12/23/2013 Breast MRI Right Breast 4.4 x 3 x 2.5 cm and a sub cm satellite lesion. No LN   01/28/2014 - 05/27/2014 Neo-Adjuvant Chemotherapy TC HP x2 cycles ( developed hand-foot syndrome) GC HP x1 cycle (developed dehydration and renal failure) CHP from 04/08/2014   07/21/2014 -  Chemotherapy Herceptin maintenance therapy   07/28/2014 Surgery Right breast mastectomy: DCIS 7 cm, 4 lymph nodes negative, no invasive breast cancer was identified, complete pathologic response    CHIEF COMPLIANT: Postoperative follow-up  INTERVAL HISTORY: [name] is a 59 year old lady with above-mentioned history of right breast cancer treated with neoadjuvant chemotherapy and had a right breast mastectomy on 07/28/2014. Mastectomy specimen revealed that was no evidence of  invasive breast cancer. She had some centimeter area DCIS, 4 lymph nodes were negative. Chest pathologic complete response to chemotherapy. She still has the drains but it appears that the amount of fluid in the drain is coming down slowly. She continues to have arthritis symptoms.  REVIEW OF SYSTEMS:   Constitutional: Denies fevers, chills or abnormal weight loss Eyes: Denies blurriness of vision Ears, nose, mouth, throat, and face: Denies mucositis or sore throat Respiratory: Denies cough, dyspnea or wheezes Cardiovascular: Denies palpitation, chest discomfort or lower extremity swelling Gastrointestinal:  Denies nausea, heartburn or change in bowel habits Skin: Denies abnormal skin rashes Lymphatics: Denies new lymphadenopathy or easy bruising Neurological:Denies numbness, tingling or new weaknesses Behavioral/Psych: Mood is stable, no new changes  Breast: Pain in the breast from recent surgeries All other systems were reviewed with the patient and are negative.  I have reviewed the past medical history, past surgical history, social history and family history with the patient and they are unchanged from previous note.  ALLERGIES:  is allergic to codeine.  MEDICATIONS:  Current Outpatient Prescriptions  Medication Sig Dispense Refill  . ALPRAZolam (XANAX) 0.5 MG tablet TAKE 1 TABLET 3 TIMES A DAY AS NEEDED 90 tablet 5  . atenolol (TENORMIN) 50 MG tablet Take 1 tablet (50 mg total) by mouth 2 (two) times daily. 60 tablet 1  . LORazepam (ATIVAN) 0.5 MG tablet Take 2 tablets (1 mg total) by mouth at bedtime. 30 tablet 0  . ondansetron (ZOFRAN) 8 MG tablet TAKE 1 TABLET BY MOUTH EVERY 8 HOURS AS NEEDED FOR NAUSEA/VOMITING 30 tablet 0  . oxyCODONE-acetaminophen (ROXICET) 5-325 MG  per tablet Take 1 tablet by mouth 3 (three) times daily as needed for severe pain (pain). 90 tablet 0  . potassium chloride SA (K-DUR,KLOR-CON) 20 MEQ tablet Take 1 tablet (20 mEq total) by mouth 2 (two) times  daily. 30 tablet 0  . prochlorperazine (COMPAZINE) 10 MG tablet TAKE 1 TABLET BY MOUTH EVERY 6 HOURS AS NEEDED FOR NAUSEA/VOMITING 30 tablet 0  . sertraline (ZOLOFT) 50 MG tablet Take 1 tablet (50 mg total) by mouth at bedtime. 90 tablet 3  . anastrozole (ARIMIDEX) 1 MG tablet Take 1 tablet (1 mg total) by mouth daily. 90 tablet 3   No current facility-administered medications for this visit.    PHYSICAL EXAMINATION: ECOG PERFORMANCE STATUS: 1 - Symptomatic but completely ambulatory  Filed Vitals:   08/05/14 0825  BP: 118/76  Pulse: 73  Temp: 97.5 F (36.4 C)  Resp: 18   Filed Weights   08/05/14 0825  Weight: 135 lb 12.8 oz (61.598 kg)    GENERAL:alert, no distress and comfortable SKIN: skin color, texture, turgor are normal, no rashes or significant lesions EYES: normal, Conjunctiva are pink and non-injected, sclera clear OROPHARYNX:no exudate, no erythema and lips, buccal mucosa, and tongue normal  NECK: supple, thyroid normal size, non-tender, without nodularity LYMPH:  no palpable lymphadenopathy in the cervical, axillary or inguinal LUNGS: clear to auscultation and percussion with normal breathing effort HEART: regular rate & rhythm and no murmurs and no lower extremity edema ABDOMEN:abdomen soft, non-tender and normal bowel sounds Musculoskeletal:no cyanosis of digits and no clubbing  NEURO: alert & oriented x 3 with fluent speech, no focal motor/sensory deficits  LABORATORY DATA:  I have reviewed the data as listed   Chemistry      Component Value Date/Time   NA 135 07/25/2014 0910   NA 138 07/21/2014 0842   K 4.2 07/25/2014 0910   K 3.2* 07/21/2014 0842   CL 106 07/25/2014 0910   CO2 23 07/25/2014 0910   CO2 23 07/21/2014 0842   BUN <5* 07/25/2014 0910   BUN 5.0* 07/21/2014 0842   CREATININE 0.59 07/25/2014 0910   CREATININE 0.7 07/21/2014 0842   CREATININE 0.76 04/03/2013 1420      Component Value Date/Time   CALCIUM 8.4 07/25/2014 0910   CALCIUM 8.8  07/21/2014 0842   ALKPHOS 169* 07/21/2014 0842   ALKPHOS 151* 02/26/2014 1238   AST 18 07/21/2014 0842   AST 25 02/26/2014 1238   ALT 9 07/21/2014 0842   ALT 38* 02/26/2014 1238   BILITOT 0.36 07/21/2014 0842   BILITOT 0.7 02/26/2014 1238       Lab Results  Component Value Date   WBC 12.0* 07/21/2014   HGB 10.8* 07/21/2014   HCT 33.9* 07/21/2014   MCV 90.9 07/21/2014   PLT 374 07/21/2014   NEUTROABS 7.8* 07/21/2014    ASSESSMENT & PLAN:  Breast cancer of 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 Status post neoadjuvant chemotherapy with TCHPx2, GCHPx1, CHPx1, HPx2 , complete pathologic response with only residual DCIS 7 cm remaining.  Pathologic counseling: I discussed the final pathology report showing complete pathologic response to neoadjuvant therapy with only residual DCIS remaining. Patient is ecstatic during this result.  Recommendation: 1. Continue maintenance Herceptin 2. Start antiestrogen therapy with anastrozole  1 mg daily from 08/19/2014  Anastrozole counseling: We discussed the risks and benefits of anti-estrogen therapy with aromatase inhibitors. These include but not limited to insomnia, hot flashes, mood changes, vaginal dryness, bone density loss, and weight gain. Although rare, serious side effects including endometrial cancer, risk of blood clots were also discussed. We strongly believe that the benefits far outweigh the risks. Patient understands these risks and consented to starting treatment. Planned treatment duration is 5 years.  Return to clinic every 6 weeks for follow-up on Herceptin and anastrozole     No orders of the defined types were placed in this encounter.   The patient has a good understanding of the  overall plan. she agrees with it. She will call with any problems that may develop before her next visit here.   Rulon Eisenmenger, MD

## 2014-08-10 ENCOUNTER — Other Ambulatory Visit: Payer: Self-pay | Admitting: *Deleted

## 2014-08-10 ENCOUNTER — Other Ambulatory Visit: Payer: Self-pay

## 2014-08-10 ENCOUNTER — Telehealth: Payer: Self-pay | Admitting: Hematology and Oncology

## 2014-08-10 NOTE — Telephone Encounter (Signed)
Patient called in and left a message to cancel her lab and tx 3/24 due to being ill.  States she did see her surgeon and received and antibiotic.  I did forward the message to see when we need to see when tx should be rescheduled   anne

## 2014-08-11 ENCOUNTER — Ambulatory Visit: Payer: Medicaid Other

## 2014-08-11 ENCOUNTER — Telehealth: Payer: Self-pay | Admitting: Hematology and Oncology

## 2014-08-11 ENCOUNTER — Other Ambulatory Visit: Payer: Self-pay

## 2014-08-11 ENCOUNTER — Ambulatory Visit: Payer: Medicaid Other | Admitting: Hematology and Oncology

## 2014-08-11 ENCOUNTER — Other Ambulatory Visit: Payer: Medicaid Other

## 2014-08-11 DIAGNOSIS — C50411 Malignant neoplasm of upper-outer quadrant of right female breast: Secondary | ICD-10-CM

## 2014-08-11 NOTE — Telephone Encounter (Signed)
Spoke with patient and she is aware of her new appointment on 3/28

## 2014-08-15 ENCOUNTER — Other Ambulatory Visit: Payer: Medicaid Other

## 2014-08-15 ENCOUNTER — Encounter (HOSPITAL_COMMUNITY)
Admission: EM | Disposition: A | Discharge status: Home / Self Care | Payer: Self-pay | Source: Home / Self Care | Attending: Emergency Medicine

## 2014-08-15 ENCOUNTER — Ambulatory Visit: Payer: Medicaid Other

## 2014-08-15 ENCOUNTER — Encounter (HOSPITAL_COMMUNITY): Payer: Self-pay | Admitting: *Deleted

## 2014-08-15 ENCOUNTER — Emergency Department (HOSPITAL_COMMUNITY): Payer: Medicaid Other

## 2014-08-15 ENCOUNTER — Observation Stay (HOSPITAL_COMMUNITY)
Admission: EM | Admit: 2014-08-15 | Discharge: 2014-08-16 | Disposition: A | Discharge status: Home / Self Care | Payer: Medicaid Other | Attending: Internal Medicine | Admitting: Internal Medicine

## 2014-08-15 ENCOUNTER — Telehealth: Payer: Self-pay | Admitting: Hematology and Oncology

## 2014-08-15 DIAGNOSIS — R05 Cough: Secondary | ICD-10-CM | POA: Diagnosis not present

## 2014-08-15 DIAGNOSIS — F419 Anxiety disorder, unspecified: Secondary | ICD-10-CM | POA: Diagnosis not present

## 2014-08-15 DIAGNOSIS — R112 Nausea with vomiting, unspecified: Secondary | ICD-10-CM | POA: Diagnosis present

## 2014-08-15 DIAGNOSIS — M069 Rheumatoid arthritis, unspecified: Secondary | ICD-10-CM | POA: Diagnosis not present

## 2014-08-15 DIAGNOSIS — E860 Dehydration: Secondary | ICD-10-CM

## 2014-08-15 DIAGNOSIS — C50411 Malignant neoplasm of upper-outer quadrant of right female breast: Secondary | ICD-10-CM | POA: Diagnosis not present

## 2014-08-15 DIAGNOSIS — Z79899 Other long term (current) drug therapy: Secondary | ICD-10-CM | POA: Insufficient documentation

## 2014-08-15 DIAGNOSIS — Z720 Tobacco use: Secondary | ICD-10-CM

## 2014-08-15 DIAGNOSIS — R197 Diarrhea, unspecified: Secondary | ICD-10-CM | POA: Insufficient documentation

## 2014-08-15 DIAGNOSIS — Z885 Allergy status to narcotic agent status: Secondary | ICD-10-CM | POA: Diagnosis not present

## 2014-08-15 DIAGNOSIS — K279 Peptic ulcer, site unspecified, unspecified as acute or chronic, without hemorrhage or perforation: Secondary | ICD-10-CM | POA: Insufficient documentation

## 2014-08-15 DIAGNOSIS — G43A0 Cyclical vomiting, not intractable: Secondary | ICD-10-CM

## 2014-08-15 DIAGNOSIS — M797 Fibromyalgia: Secondary | ICD-10-CM | POA: Insufficient documentation

## 2014-08-15 DIAGNOSIS — F329 Major depressive disorder, single episode, unspecified: Secondary | ICD-10-CM | POA: Insufficient documentation

## 2014-08-15 DIAGNOSIS — E876 Hypokalemia: Secondary | ICD-10-CM | POA: Diagnosis not present

## 2014-08-15 DIAGNOSIS — F17210 Nicotine dependence, cigarettes, uncomplicated: Secondary | ICD-10-CM | POA: Diagnosis not present

## 2014-08-15 DIAGNOSIS — M810 Age-related osteoporosis without current pathological fracture: Secondary | ICD-10-CM | POA: Insufficient documentation

## 2014-08-15 DIAGNOSIS — Z853 Personal history of malignant neoplasm of breast: Secondary | ICD-10-CM | POA: Diagnosis not present

## 2014-08-15 DIAGNOSIS — F17200 Nicotine dependence, unspecified, uncomplicated: Secondary | ICD-10-CM | POA: Diagnosis present

## 2014-08-15 DIAGNOSIS — K219 Gastro-esophageal reflux disease without esophagitis: Secondary | ICD-10-CM

## 2014-08-15 DIAGNOSIS — R059 Cough, unspecified: Secondary | ICD-10-CM

## 2014-08-15 DIAGNOSIS — I10 Essential (primary) hypertension: Secondary | ICD-10-CM | POA: Insufficient documentation

## 2014-08-15 DIAGNOSIS — R6889 Other general symptoms and signs: Secondary | ICD-10-CM | POA: Diagnosis present

## 2014-08-15 LAB — CBC WITH DIFFERENTIAL/PLATELET
BASOS ABS: 0 10*3/uL (ref 0.0–0.1)
Basophils Relative: 0 % (ref 0–1)
EOS PCT: 1 % (ref 0–5)
Eosinophils Absolute: 0.1 10*3/uL (ref 0.0–0.7)
HCT: 35.7 % — ABNORMAL LOW (ref 36.0–46.0)
HEMOGLOBIN: 11.9 g/dL — AB (ref 12.0–15.0)
Lymphocytes Relative: 42 % (ref 12–46)
Lymphs Abs: 3.6 10*3/uL (ref 0.7–4.0)
MCH: 28.7 pg (ref 26.0–34.0)
MCHC: 33.3 g/dL (ref 30.0–36.0)
MCV: 86.2 fL (ref 78.0–100.0)
MONOS PCT: 5 % (ref 3–12)
Monocytes Absolute: 0.4 10*3/uL (ref 0.1–1.0)
Neutro Abs: 4.5 10*3/uL (ref 1.7–7.7)
Neutrophils Relative %: 52 % (ref 43–77)
Platelets: 298 10*3/uL (ref 150–400)
RBC: 4.14 MIL/uL (ref 3.87–5.11)
RDW: 13.9 % (ref 11.5–15.5)
WBC: 8.6 10*3/uL (ref 4.0–10.5)

## 2014-08-15 LAB — COMPREHENSIVE METABOLIC PANEL
ALK PHOS: 139 U/L — AB (ref 39–117)
ALT: 12 U/L (ref 0–35)
AST: 22 U/L (ref 0–37)
Albumin: 3.8 g/dL (ref 3.5–5.2)
Anion gap: 12 (ref 5–15)
BUN: 7 mg/dL (ref 6–23)
CHLORIDE: 103 mmol/L (ref 96–112)
CO2: 18 mmol/L — ABNORMAL LOW (ref 19–32)
Calcium: 8.3 mg/dL — ABNORMAL LOW (ref 8.4–10.5)
Creatinine, Ser: 0.77 mg/dL (ref 0.50–1.10)
GLUCOSE: 106 mg/dL — AB (ref 70–99)
Potassium: 2.8 mmol/L — ABNORMAL LOW (ref 3.5–5.1)
SODIUM: 133 mmol/L — AB (ref 135–145)
Total Bilirubin: 0.7 mg/dL (ref 0.3–1.2)
Total Protein: 7.5 g/dL (ref 6.0–8.3)

## 2014-08-15 LAB — INFLUENZA PANEL BY PCR (TYPE A & B)
H1N1 flu by pcr: NOT DETECTED
INFLBPCR: NEGATIVE
Influenza A By PCR: NEGATIVE

## 2014-08-15 LAB — BRAIN NATRIURETIC PEPTIDE: B NATRIURETIC PEPTIDE 5: 24.3 pg/mL (ref 0.0–100.0)

## 2014-08-15 LAB — PHOSPHORUS: PHOSPHORUS: 3.2 mg/dL (ref 2.3–4.6)

## 2014-08-15 LAB — CLOSTRIDIUM DIFFICILE BY PCR: Toxigenic C. Difficile by PCR: NEGATIVE

## 2014-08-15 LAB — LACTIC ACID, PLASMA: Lactic Acid, Venous: 1.3 mmol/L (ref 0.5–2.0)

## 2014-08-15 SURGERY — LEFT HEART CATHETERIZATION WITH CORONARY ANGIOGRAM

## 2014-08-15 MED ORDER — AMOXICILLIN-POT CLAVULANATE 875-125 MG PO TABS
1.0000 | ORAL_TABLET | Freq: Two times a day (BID) | ORAL | Status: DC
  Administered 2014-08-15 – 2014-08-16 (×3): 1 via ORAL
  Filled 2014-08-15 (×3): qty 1

## 2014-08-15 MED ORDER — LORATADINE 10 MG PO TABS
10.0000 mg | ORAL_TABLET | Freq: Every day | ORAL | Status: DC
  Administered 2014-08-15 – 2014-08-16 (×2): 10 mg via ORAL
  Filled 2014-08-15 (×2): qty 1

## 2014-08-15 MED ORDER — ALBUTEROL SULFATE (2.5 MG/3ML) 0.083% IN NEBU: 2.5000 mg | INHALATION_SOLUTION | RESPIRATORY_TRACT | Status: DC | PRN

## 2014-08-15 MED ORDER — POTASSIUM CHLORIDE CRYS ER 20 MEQ PO TBCR
40.0000 meq | EXTENDED_RELEASE_TABLET | Freq: Two times a day (BID) | ORAL | Status: DC
  Administered 2014-08-15 – 2014-08-16 (×3): 40 meq via ORAL
  Filled 2014-08-15 (×2): qty 2

## 2014-08-15 MED ORDER — POTASSIUM CHLORIDE 10 MEQ/100ML IV SOLN
10.0000 meq | Freq: Once | INTRAVENOUS | Status: AC
  Administered 2014-08-15: 10 meq via INTRAVENOUS
  Filled 2014-08-15: qty 100

## 2014-08-15 MED ORDER — OXYCODONE-ACETAMINOPHEN 5-325 MG PO TABS
1.0000 | ORAL_TABLET | Freq: Four times a day (QID) | ORAL | Status: DC | PRN
  Administered 2014-08-15 – 2014-08-16 (×3): 2 via ORAL
  Filled 2014-08-15 (×3): qty 2

## 2014-08-15 MED ORDER — POTASSIUM CHLORIDE CRYS ER 20 MEQ PO TBCR
40.0000 meq | EXTENDED_RELEASE_TABLET | Freq: Once | ORAL | Status: AC
  Administered 2014-08-15: 40 meq via ORAL
  Filled 2014-08-15: qty 2

## 2014-08-15 MED ORDER — HEPARIN SODIUM (PORCINE) 5000 UNIT/ML IJ SOLN
5000.0000 [IU] | Freq: Three times a day (TID) | INTRAMUSCULAR | Status: DC
  Administered 2014-08-15 – 2014-08-16 (×3): 5000 [IU] via SUBCUTANEOUS
  Filled 2014-08-15 (×3): qty 1

## 2014-08-15 MED ORDER — NICOTINE 14 MG/24HR TD PT24
14.0000 mg | MEDICATED_PATCH | Freq: Every day | TRANSDERMAL | Status: DC
  Filled 2014-08-15 (×2): qty 1

## 2014-08-15 MED ORDER — SERTRALINE HCL 25 MG PO TABS
25.0000 mg | ORAL_TABLET | Freq: Every day | ORAL | Status: DC
  Administered 2014-08-15: 25 mg via ORAL
  Filled 2014-08-15: qty 1

## 2014-08-15 MED ORDER — ONDANSETRON HCL 4 MG/2ML IJ SOLN
4.0000 mg | Freq: Four times a day (QID) | INTRAMUSCULAR | Status: DC | PRN
  Administered 2014-08-15: 4 mg via INTRAVENOUS
  Filled 2014-08-15: qty 2

## 2014-08-15 MED ORDER — ACETAMINOPHEN 500 MG PO TABS
500.0000 mg | ORAL_TABLET | Freq: Four times a day (QID) | ORAL | Status: DC | PRN
  Administered 2014-08-15: 500 mg via ORAL
  Filled 2014-08-15: qty 1

## 2014-08-15 MED ORDER — PROMETHAZINE HCL 25 MG/ML IJ SOLN: 25.0000 mg | Freq: Three times a day (TID) | INTRAMUSCULAR | Status: DC | PRN

## 2014-08-15 MED ORDER — SODIUM CHLORIDE 0.9 % IV BOLUS (SEPSIS)
500.0000 mL | Freq: Once | INTRAVENOUS | Status: AC
Start: 2014-08-15 — End: 2014-08-15
  Administered 2014-08-15: 500 mL via INTRAVENOUS

## 2014-08-15 MED ORDER — PANTOPRAZOLE SODIUM 40 MG PO TBEC
40.0000 mg | DELAYED_RELEASE_TABLET | Freq: Every day | ORAL | Status: DC
Start: 2014-08-16 — End: 2014-08-16
  Administered 2014-08-16: 40 mg via ORAL
  Filled 2014-08-15: qty 1

## 2014-08-15 MED ORDER — MAGNESIUM SULFATE 2 GM/50ML IV SOLN
2.0000 g | Freq: Once | INTRAVENOUS | Status: AC
  Administered 2014-08-15: 2 g via INTRAVENOUS
  Filled 2014-08-15: qty 50

## 2014-08-15 MED ORDER — SACCHAROMYCES BOULARDII 250 MG PO CAPS
250.0000 mg | ORAL_CAPSULE | Freq: Two times a day (BID) | ORAL | Status: DC
  Administered 2014-08-15 – 2014-08-16 (×2): 250 mg via ORAL
  Filled 2014-08-15 (×2): qty 1

## 2014-08-15 MED ORDER — BENZONATATE 100 MG PO CAPS: 100.0000 mg | ORAL_CAPSULE | Freq: Three times a day (TID) | ORAL | Status: DC | PRN

## 2014-08-15 MED ORDER — SODIUM CHLORIDE 0.9 % IV SOLN
INTRAVENOUS | Status: DC
  Administered 2014-08-15 – 2014-08-16 (×3): via INTRAVENOUS

## 2014-08-15 MED ORDER — ANASTROZOLE 1 MG PO TABS: 1.0000 mg | ORAL_TABLET | Freq: Every day | ORAL | Status: DC

## 2014-08-15 MED ORDER — MORPHINE SULFATE 2 MG/ML IJ SOLN
1.0000 mg | INTRAMUSCULAR | Status: DC | PRN
  Filled 2014-08-15: qty 1

## 2014-08-15 MED ORDER — ALPRAZOLAM 0.5 MG PO TABS: 0.5000 mg | ORAL_TABLET | Freq: Three times a day (TID) | ORAL | Status: DC | PRN

## 2014-08-15 NOTE — ED Notes (Signed)
Spoke to [place] and pt can go at 10:38 am ..klj

## 2014-08-15 NOTE — ED Provider Notes (Signed)
CSN: [id number]     Arrival date & time 08/15/14  0718 History   First MD Initiated Contact with Patient 08/15/14 [phone]     Chief Complaint  Patient presents with  . Influenza     (Consider location/radiation/quality/duration/timing/severity/associated sxs/prior Treatment) Patient is a 59 y.o. female presenting with cough. The history is provided by the patient (the pt states she has had a cough for 2 weeks.   she is fatiqued and has had nausea and diarhea.   s/p breast removal for cancer 3/10).  Cough Cough characteristics:  Non-productive Severity:  Moderate Onset quality:  Gradual Timing:  Intermittent Progression:  Waxing and waning Chronicity:  New Smoker: no   Context: not animal exposure   Associated symptoms: no chest pain, no eye discharge, no headaches and no rash     Past Medical History  Diagnosis Date  . 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), hypoglycemia 2/04-resolved with PO calcium + D, SVT, was treated with iterferon and ribavirin-10/3-2/04  . PUD (peptic ulcer disease)   . Fibromyalgia   . Rheumatoid arthritis(714.0)     possible  . SVT (supraventricular tachycardia)   . Depression   . Osteoporosis   . Hepatitis C     Pt was given too much interferon - critical care x2d, 5 days total hosp  . Anxiety   . Wears glasses   . Heavy smoker   . Cancer     right breast cancer   Past Surgical History  Procedure Laterality Date  . Cesarean section  09/17/80  . Dilation and curettage of uterus    . Hysterectomy-for dysmenorrhea  09/17/00  . Nasal sinus surgery  03/20/84    for deviated septum  . Tonsillectomy  05/20/61  . Abdominal hysterectomy    . Colonoscopy    . Upper gi endoscopy    . Portacath placement Left 01/20/2014    Procedure: INSERTION PORT-A-CATH;  Surgeon: [name], MD;  Location: [facility];  Service: General;  Laterality: Left;  Marland Kitchen Mastectomy w/  sentinel node biopsy Right 07/28/2014    Procedure: RIGHT MASTECTOMY WITH SENTINEL LYMPH NODE BIOPSY ;  Surgeon: [name], MD;  Location: [facility];  Service: General;  Laterality: Right;   Family History  Problem Relation Age of Onset  . Colon cancer Neg Hx   . Cancer Father 57    lung  . Dementia Father   . Dementia Mother   . Hypertension Mother   . Hyperlipidemia Mother   . Breast cancer Maternal Aunt   . Cancer Paternal Uncle   . Breast cancer Maternal Aunt   . Breast cancer Maternal Aunt    History  Substance Use Topics  . Smoking status: Current Every Day Smoker -- 1.00 packs/day for 45 years    Types: Cigarettes  . Smokeless tobacco: Never Used     Comment: 2 ppd since 59 yo   . Alcohol Use: Yes     Comment: denies; reports 3-4 beers/night to Dr. 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  HENT: Negative for congestion, ear discharge and sinus pressure.   Eyes: Negative  for discharge.  Respiratory: Positive for cough.   Cardiovascular: Negative for chest pain.  Gastrointestinal: Negative for abdominal pain and diarrhea.  Genitourinary: Negative for frequency and hematuria.  Musculoskeletal: Negative for back pain.  Skin: Negative for rash.  Neurological: Negative for seizures and headaches.  Psychiatric/Behavioral: Negative for hallucinations.      Allergies  Codeine  Home Medications   Prior to Admission medications   Medication Sig Start Date End Date Taking? Authorizing Provider  acetaminophen (TYLENOL) 500 MG tablet Take 500 mg by mouth every 6 (six) hours as needed for mild pain, fever or headache.   Yes Historical Provider, MD  ALPRAZolam (XANAX) 0.5 MG tablet TAKE 1 TABLET 3 TIMES A DAY AS NEEDED Patient taking differently: Take  0.25-0.5 mg by mouth 3 (three) times daily as needed for anxiety.  05/14/14  Yes Robyn Haber, MD  amoxicillin-clavulanate (AUGMENTIN) 875-125 MG per tablet Take 1 tablet by mouth 2 (two) times daily. 08/08/14  Yes Historical Provider, MD  anastrozole (ARIMIDEX) 1 MG tablet Take 1 tablet (1 mg total) by mouth daily. 08/05/14  Yes Nicholas Lose, MD  atenolol (TENORMIN) 50 MG tablet Take 1 tablet (50 mg total) by mouth 2 (two) times daily. 07/12/14  Yes Lelon Perla, MD  ondansetron (ZOFRAN) 8 MG tablet Take 8 mg by mouth every 8 (eight) hours as needed for nausea or vomiting.   Yes Historical Provider, MD  oxyCODONE-acetaminophen (ROXICET) 5-325 MG per tablet Take 1 tablet by mouth 3 (three) times daily as needed for severe pain (pain). Patient taking differently: Take 1 tablet by mouth 3 (three) times daily as needed for severe pain (For pain.).  08/03/14  Yes Nicholas Lose, MD  potassium chloride SA (K-DUR,KLOR-CON) 20 MEQ tablet Take 20 mEq by mouth 2 (two) times daily as needed (For potassium loss.).   Yes Historical Provider, MD  PRESCRIPTION MEDICATION Patient receives her chemo treatments at [facility] with Dr. Lindi Adie. Her last dose of Herceptin 399mg  was on 07/21/14 and she is to start taking Arimidex 1mg  daily on 08/19/14.   Yes Historical Provider, MD  prochlorperazine (COMPAZINE) 10 MG tablet Take 2 mg by mouth every 6 (six) hours as needed for nausea or vomiting.   Yes Historical Provider, MD  sertraline (ZOLOFT) 50 MG tablet Take 1 tablet (50 mg total) by mouth at bedtime. Patient taking differently: Take 25 mg by mouth at bedtime.  02/28/14  Yes Chelle S Jeffery, PA-C  LORazepam (ATIVAN) 0.5 MG tablet Take 2 tablets (1 mg total) by mouth at bedtime. Patient not taking: Reported on 08/15/2014 04/05/14   Nicholas Lose, MD  ondansetron (ZOFRAN) 8 MG tablet TAKE 1 TABLET BY MOUTH EVERY 8 HOURS AS NEEDED FOR NAUSEA/VOMITING 06/30/14   Nicholas Lose, MD  potassium chloride  SA (K-DUR,KLOR-CON) 20 MEQ tablet Take 1 tablet (20 mEq total) by mouth 2 (two) times daily. Patient not taking: Reported on 08/15/2014 05/27/14   Nicholas Lose, MD  prochlorperazine (COMPAZINE) 10 MG tablet TAKE 1 TABLET BY MOUTH EVERY 6 HOURS AS NEEDED FOR NAUSEA/VOMITING 06/30/14   Nicholas Lose, MD   BP 104/75 mmHg  Pulse 74  Temp(Src) 97.9 F (36.6 C) (Oral)  Resp 16  Ht 5\' 4"  (1.626 m)  Wt 135 lb (61.236 kg)  BMI 23.16 kg/m2  SpO2 99% Physical Exam  Constitutional: She is oriented to person, place, and time. She appears well-developed.  HENT:  Head: Normocephalic.  Eyes: Conjunctivae and EOM are normal. No scleral icterus.  Neck: Neck supple. No thyromegaly present.  Cardiovascular: Normal rate and regular rhythm.  Exam reveals no gallop and no friction rub.   No murmur heard. Pulmonary/Chest: No stridor. She has no wheezes. She has no rales. She exhibits no tenderness.  Abdominal: She exhibits no distension. There is no tenderness. There is no rebound.  Musculoskeletal: Normal range of motion. She exhibits no edema.  Lymphadenopathy:    She has no cervical adenopathy.  Neurological: She is oriented to person, place, and time. She exhibits normal muscle tone. Coordination normal.  Skin: No rash noted. No erythema.  Psychiatric: She has a normal mood and affect. Her behavior is normal.    ED Course  Procedures (including critical care time) Labs Review Labs Reviewed  CBC WITH DIFFERENTIAL/PLATELET - Abnormal; Notable for the following:    Hemoglobin 11.9 (*)    HCT 35.7 (*)    All other components within normal limits  COMPREHENSIVE METABOLIC PANEL - Abnormal; Notable for the following:    Sodium 133 (*)    Potassium 2.8 (*)    CO2 18 (*)    Glucose, Bld 106 (*)    Calcium 8.3 (*)    Alkaline Phosphatase 139 (*)    All other components within normal limits  LACTIC ACID, PLASMA  BRAIN NATRIURETIC PEPTIDE  INFLUENZA PANEL BY PCR (TYPE A & B, H1N1)    Imaging  Review Dg Chest Port 1 View  08/15/2014   CLINICAL DATA:  Breast cancer, recent mastectomy. New flu like symptoms for 1 week.  EXAM: PORTABLE CHEST - 1 VIEW  COMPARISON:  02/26/2014  FINDINGS: There is no focal parenchymal opacity, pleural effusion, or pneumothorax. The heart and mediastinal contours are unremarkable. There is a left-sided Port-A-Cath in satisfactory position.  There are surgical clips in the right axilla from prior axillary dissection. Prior right mastectomy.  Mild osteoarthritis of the left glenohumeral joint.  IMPRESSION: No active disease.   Electronically Signed   By: Kathreen Devoid   On: 08/15/2014 08:00     EKG Interpretation   Date/Time:  Monday August 15 2014 07:32:41 EDT Ventricular Rate:  77 PR Interval:  182 QRS Duration: 76 QT Interval:  411 QTC Calculation: 465 R Axis:   33 Text Interpretation:  Sinus rhythm Probable left atrial enlargement  Probable anteroseptal infarct, old Baseline wander in lead(s) II III aVL  aVF V2 V4 V5 V6 Confirmed by Starling Jessie  MD, Torunn Chancellor ([id number]) on 08/15/2014  7:52:12 AM      MDM   Final diagnoses:  Cough  Hypokalemia    Admit hypokalemia    Milton Ferguson, MD 08/15/14 1006

## 2014-08-15 NOTE — ED Notes (Signed)
Cancer patient with flu like symptoms.  Pt reports that she has been having URI with cough (dry) and some sob.  Pt was placed on antibiotic by her surgeon who performed her mastectomy to prevent her from developing pneumonia.  Pt states that she has been getting worse.  Pt states that she feels generally weak, has cough, nausea and some diarrhea.  Pt is a smoker.  Pt has also had fever and chills.

## 2014-08-15 NOTE — H&P (Signed)
[facility] History and Physical  SABRYNA LAHM QMG:[id number] DOB: [birth year] DOA: 08/15/2014  Referring physician: Dr. Roderic Palau PCP: Lamar Blinks, MD   Chief Complaint: nausea/vomiting, diarrhea, general malaise and non-productive cough  HPI: [name] is a 59 y.o. female with PMH significant for breast cancer (s/p right mastectomy on 07/28/14); GERD with PUD, tobacco abuse and anxiety; who presented to ED complaining of general malaise, cough, N/V and diarrhea. Symptoms has been present for the last 10 days or so, but worsen in the last 3 days. She endorses some subjective fever lately. Patient has been started on augmentin by surgeon to treat bronchitis/early PNA and she is half done with therapy and still feeling bad. No CP, no focal weakness, no hematemesis, no hematuria, no melena and no other complaints. In ED was found to be dehydrated and with hypokalemia. Flu by PCR done and was negative. Patient still having hard time keeping things down. [facility] called to admit for observation, fluid resuscitation and electrolytes repletion.    Review of Systems:  Positive for allergic rhinitis; otherwise Negative except as mentioned on HPI.  Past Medical History  Diagnosis Date  . 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), hypoglycemia 2/04-resolved with PO calcium + D, SVT, was treated with iterferon and ribavirin-10/3-2/04  . PUD (peptic ulcer disease)   . Fibromyalgia   . Rheumatoid arthritis(714.0)     possible  . SVT (supraventricular tachycardia)   . Depression   . Osteoporosis   . Hepatitis C     Pt was given too much interferon - critical care x2d, 5 days total hosp  . Anxiety   . Wears glasses   . Heavy smoker   . Cancer     right breast cancer   Past Surgical History  Procedure Laterality Date  . Cesarean section  09/17/80  . Dilation and curettage of uterus    . Hysterectomy-for  dysmenorrhea  09/17/00  . Nasal sinus surgery  03/20/84    for deviated septum  . Tonsillectomy  05/20/61  . Abdominal hysterectomy    . Colonoscopy    . Upper gi endoscopy    . Portacath placement Left 01/20/2014    Procedure: INSERTION PORT-A-CATH;  Surgeon: [name], MD;  Location: [facility];  Service: General;  Laterality: Left;  Marland Kitchen Mastectomy w/ sentinel node biopsy Right 07/28/2014    Procedure: RIGHT MASTECTOMY WITH SENTINEL LYMPH NODE BIOPSY ;  Surgeon: [name], MD;  Location: [facility];  Service: General;  Laterality: Right;   Social History:  reports that she has been smoking Cigarettes.  She has a 45 pack-year smoking history. She has never used smokeless tobacco. She reports that she drinks alcohol. She reports that she does not use illicit drugs.  Allergies  Allergen Reactions  . Codeine Itching and Nausea Only    Pt has had codeine with pre-administration to prevent reaction without any problem.     Family History  Problem Relation Age of Onset  . Colon cancer Neg Hx   . Cancer Father 28    lung  . Dementia Father   . Dementia Mother   . Hypertension Mother   . Hyperlipidemia Mother   . Breast cancer Maternal Aunt   . Cancer Paternal Uncle   . Breast cancer Maternal Aunt   . Breast cancer Maternal Aunt      Prior to Admission medications   Medication Sig Start Date  End Date Taking? Authorizing Provider  acetaminophen (TYLENOL) 500 MG tablet Take 500 mg by mouth every 6 (six) hours as needed for mild pain, fever or headache.   Yes Historical Provider, MD  ALPRAZolam (XANAX) 0.5 MG tablet TAKE 1 TABLET 3 TIMES A DAY AS NEEDED Patient taking differently: Take 0.25-0.5 mg by mouth 3 (three) times daily as needed for anxiety.  05/14/14  Yes Robyn Haber, MD  amoxicillin-clavulanate (AUGMENTIN) 875-125 MG per tablet Take 1 tablet by mouth 2 (two) times daily. 08/08/14  Yes Historical Provider, MD  anastrozole (ARIMIDEX) 1 MG tablet  Take 1 tablet (1 mg total) by mouth daily. 08/05/14  Yes Nicholas Lose, MD  atenolol (TENORMIN) 50 MG tablet Take 1 tablet (50 mg total) by mouth 2 (two) times daily. 07/12/14  Yes Lelon Perla, MD  ondansetron (ZOFRAN) 8 MG tablet Take 8 mg by mouth every 8 (eight) hours as needed for nausea or vomiting.   Yes Historical Provider, MD  oxyCODONE-acetaminophen (ROXICET) 5-325 MG per tablet Take 1 tablet by mouth 3 (three) times daily as needed for severe pain (pain). Patient taking differently: Take 1 tablet by mouth 3 (three) times daily as needed for severe pain (For pain.).  08/03/14  Yes Nicholas Lose, MD  potassium chloride SA (K-DUR,KLOR-CON) 20 MEQ tablet Take 20 mEq by mouth 2 (two) times daily as needed (For potassium loss.).   Yes Historical Provider, MD  PRESCRIPTION MEDICATION Patient receives her chemo treatments at the [facility] with Dr. Lindi Adie. Her last dose of Herceptin 399mg  was on 07/21/14 and she is to start taking Arimidex 1mg  daily on 08/19/14.   Yes Historical Provider, MD  prochlorperazine (COMPAZINE) 10 MG tablet Take 2 mg by mouth every 6 (six) hours as needed for nausea or vomiting.   Yes Historical Provider, MD  sertraline (ZOLOFT) 50 MG tablet Take 1 tablet (50 mg total) by mouth at bedtime. Patient taking differently: Take 25 mg by mouth at bedtime.  02/28/14  Yes Chelle S Jeffery, PA-C  LORazepam (ATIVAN) 0.5 MG tablet Take 2 tablets (1 mg total) by mouth at bedtime. Patient not taking: Reported on 08/15/2014 04/05/14   Nicholas Lose, MD  ondansetron (ZOFRAN) 8 MG tablet TAKE 1 TABLET BY MOUTH EVERY 8 HOURS AS NEEDED FOR NAUSEA/VOMITING 06/30/14   Nicholas Lose, MD  potassium chloride SA (K-DUR,KLOR-CON) 20 MEQ tablet Take 1 tablet (20 mEq total) by mouth 2 (two) times daily. Patient not taking: Reported on 08/15/2014 05/27/14   Nicholas Lose, MD  prochlorperazine (COMPAZINE) 10 MG tablet TAKE 1 TABLET BY MOUTH EVERY 6 HOURS AS NEEDED FOR NAUSEA/VOMITING  06/30/14   Nicholas Lose, MD   Physical Exam: Filed Vitals:   08/15/14 0848 08/15/14 0939 08/15/14 1021 08/15/14 1108  BP: 110/58 104/75 90/76 115/74  Pulse: 75 74 71 80  Temp:   98 F (36.7 C) 98 F (36.7 C)  TempSrc:    Oral  Resp:  16 12 14   Height:      Weight:      SpO2: 99% 99% 100% 100%    Wt Readings from Last 3 Encounters:  08/15/14 61.236 kg (135 lb)  08/05/14 61.598 kg (135 lb 12.8 oz)  07/28/14 61.292 kg (135 lb 2 oz)    General:  Appears calm and comfortable; afebrile; cooperative to examination. Non-productive cough present during examination Eyes: PERRL, normal lids, irises & conjunctiva, no icterus ENT: grossly normal hearing, dry MM, positive description of post nasal drip; no ears  drainage  Neck: no LAD, masses or thyromegaly, no JVD Cardiovascular: RRR, no rubs, no gallops, no murmurs, no LE edema Respiratory: scattered rhonchi; no wheezing, no crackles. Abdomen: soft, nt, nd; positive BS Skin: no rash or petechiae; wound healing properly and w/o discharge after right mastectomy Musculoskeletal: grossly normal tone BUE/BLE Psychiatric: grossly normal mood and affect, speech fluent and appropriate Neurologic: grossly non-focal.          Labs on Admission:  Basic Metabolic Panel:  Recent Labs Lab 08/15/14 0750  NA 133*  K 2.8*  CL 103  CO2 18*  GLUCOSE 106*  BUN 7  CREATININE 0.77  CALCIUM 8.3*   Liver Function Tests:  Recent Labs Lab 08/15/14 0750  AST 22  ALT 12  ALKPHOS 139*  BILITOT 0.7  PROT 7.5  ALBUMIN 3.8   CBC:  Recent Labs Lab 08/15/14 0750  WBC 8.6  NEUTROABS 4.5  HGB 11.9*  HCT 35.7*  MCV 86.2  PLT 298   BNP (last 3 results)  Recent Labs  08/15/14 0750  BNP 24.3    Radiological Exams on Admission: Dg Chest Port 1 View  08/15/2014   CLINICAL DATA:  Breast cancer, recent mastectomy. New flu like symptoms for 1 week.  EXAM: PORTABLE CHEST - 1 VIEW  COMPARISON:  02/26/2014  FINDINGS: There is no focal  parenchymal opacity, pleural effusion, or pneumothorax. The heart and mediastinal contours are unremarkable. There is a left-sided Port-A-Cath in satisfactory position.  There are surgical clips in the right axilla from prior axillary dissection. Prior right mastectomy.  Mild osteoarthritis of the left glenohumeral joint.  IMPRESSION: No active disease.   Electronically Signed   By: Kathreen Devoid   On: 08/15/2014 08:00    EKG:   Ventricular Rate: 77 PR Interval: 182 QRS Duration: 76 QT Interval: 411 QTC Calculation: 465 R Axis: 33 Text Interpretation: Sinus rhythm Probable left atrial enlargement   Assessment/Plan 1-Flu like symptoms: most likely due to bronchitis vs viral URI. No infiltrates on CXR. Patient has been on augmentin as indicated by surgeon. Currently w/o fever -admit for observation -provide IVF and supportive care -will add tessalon for cough control -continue augmentin and complete therapy -influenza by PCR neg -start claritin to help with post-nasal drip/rhinitis -PRN nebulizer -will follow response  2-nausea/vomiting/diarrhea: most likely due to same viral infection. Worsen probably by the use of abx's -C. Diff was checked an negative -will add florastor and if needed even low dose loperamide can be use -will replete electrolytes -advance diet slowly -use PRN antiemetics  3-hypokalemia: due to GI loses -will replete as needed -check Mg and phosphorus   4-TOBACCO ABUSE: cessation counseling provided.  -will use nicotine patch  5-GASTROESOPHAGEAL REFLUX, NO ESOPHAGITIS: will continue PPI  6-Breast cancer of upper-outer quadrant of right female breast: s/p mastectomy. -continue follow up with surgery service and oncology as schedule -plan is to start arimidex on 08/19/14  7-Dehydration: due to GI loses -will provide IVF's resuscitation and follow response  8-anxiety: continue PRN xanax   Code Status: Full DVT Prophylaxis: heparin  Family  Communication: no family at bedside Disposition Plan: LOS < 2 midnights, observation, telemetry (due to hypokalemia)  Time spent: 50 minutes  [place], [facility] Pager [phone]

## 2014-08-15 NOTE — Telephone Encounter (Signed)
Patient called in to advise that she was cancelling her appointment today as she is in the er and getting ivf.  She statedt that she ill call back to reschedul.  Sharyn Lull in chemo cancelled her appointments

## 2014-08-16 DIAGNOSIS — E876 Hypokalemia: Secondary | ICD-10-CM | POA: Diagnosis not present

## 2014-08-16 DIAGNOSIS — R05 Cough: Secondary | ICD-10-CM | POA: Diagnosis not present

## 2014-08-16 DIAGNOSIS — C50411 Malignant neoplasm of upper-outer quadrant of right female breast: Secondary | ICD-10-CM | POA: Diagnosis not present

## 2014-08-16 DIAGNOSIS — E860 Dehydration: Secondary | ICD-10-CM | POA: Diagnosis not present

## 2014-08-16 DIAGNOSIS — R059 Cough, unspecified: Secondary | ICD-10-CM | POA: Insufficient documentation

## 2014-08-16 LAB — COMPREHENSIVE METABOLIC PANEL
ALT: 10 U/L (ref 0–35)
AST: 15 U/L (ref 0–37)
Albumin: 3.3 g/dL — ABNORMAL LOW (ref 3.5–5.2)
Alkaline Phosphatase: 129 U/L — ABNORMAL HIGH (ref 39–117)
Anion gap: 9 (ref 5–15)
BUN: <5 mg/dL — ABNORMAL LOW (ref 6–23)
CHLORIDE: 114 mmol/L — AB (ref 96–112)
CO2: 15 mmol/L — ABNORMAL LOW (ref 19–32)
CREATININE: 0.65 mg/dL (ref 0.50–1.10)
Calcium: 8.2 mg/dL — ABNORMAL LOW (ref 8.4–10.5)
GFR calc Af Amer: >90 mL/min (ref >90)
GFR calc non Af Amer: >90 mL/min (ref >90)
GLUCOSE: 104 mg/dL — AB (ref 70–99)
POTASSIUM: 4.1 mmol/L (ref 3.5–5.1)
SODIUM: 138 mmol/L (ref 135–145)
TOTAL PROTEIN: 6.6 g/dL (ref 6.0–8.3)
Total Bilirubin: 0.3 mg/dL (ref 0.3–1.2)

## 2014-08-16 LAB — CBC
HEMATOCRIT: 32.3 % — AB (ref 36.0–46.0)
HEMOGLOBIN: 10.5 g/dL — AB (ref 12.0–15.0)
MCH: 28.8 pg (ref 26.0–34.0)
MCHC: 32.5 g/dL (ref 30.0–36.0)
MCV: 88.7 fL (ref 78.0–100.0)
Platelets: 274 10*3/uL (ref 150–400)
RBC: 3.64 MIL/uL — ABNORMAL LOW (ref 3.87–5.11)
RDW: 14 % (ref 11.5–15.5)
WBC: 6.4 10*3/uL (ref 4.0–10.5)

## 2014-08-16 LAB — MAGNESIUM: Magnesium: 2.1 mg/dL (ref 1.5–2.5)

## 2014-08-16 MED ORDER — POTASSIUM CHLORIDE CRYS ER 20 MEQ PO TBCR: 40.0000 meq | EXTENDED_RELEASE_TABLET | Freq: Every day | ORAL | Status: DC

## 2014-08-16 MED ORDER — LORATADINE 10 MG PO TABS: 10.0000 mg | ORAL_TABLET | Freq: Every day | ORAL | Status: DC

## 2014-08-16 MED ORDER — METOPROLOL TARTRATE 1 MG/ML IV SOLN: 2.5000 mg | Freq: Once | INTRAVENOUS | Status: DC

## 2014-08-16 MED ORDER — PANTOPRAZOLE SODIUM 40 MG PO TBEC: 40.0000 mg | DELAYED_RELEASE_TABLET | Freq: Every day | ORAL | Status: DC

## 2014-08-16 MED ORDER — SACCHAROMYCES BOULARDII 250 MG PO CAPS
250.0000 mg | ORAL_CAPSULE | Freq: Two times a day (BID) | ORAL | Status: DC
Start: 2014-08-16 — End: 2014-11-22

## 2014-08-16 MED ORDER — ATENOLOL 50 MG PO TABS
50.0000 mg | ORAL_TABLET | Freq: Two times a day (BID) | ORAL | Status: DC
  Administered 2014-08-16: 50 mg via ORAL
  Filled 2014-08-16 (×3): qty 1

## 2014-08-16 MED ORDER — BENZONATATE 100 MG PO CAPS: 100.0000 mg | ORAL_CAPSULE | Freq: Three times a day (TID) | ORAL | Status: DC | PRN

## 2014-08-16 NOTE — Progress Notes (Signed)
EKG shows sinus rhythm with a rate of 80.  BP back to baseline.  Will continue to monitor.

## 2014-08-16 NOTE — Progress Notes (Signed)
Pt complaining of palpitations, ekg showed SVT with rate of 150.  BP 81/43. tPt stated that she has had this before and has been off her Tenormin this hospitalization. Dr Dyann Kief notified, orders received.  Will continue to monitor.

## 2014-08-16 NOTE — Discharge Summary (Signed)
Physician Discharge Summary  [name]:[id number] DOB: [birth year] DOA: 08/15/2014  PCP: Lamar Blinks, MD  Admit date: 08/15/2014 Discharge date: 08/16/2014  Time spent: 30 minutes  Recommendations for Outpatient Follow-up:  1. Repeat basic metabolic panel to assess electrolytes and renal function  Discharge Diagnoses:  Active Problems:   TOBACCO ABUSE   GASTROESOPHAGEAL REFLUX, NO ESOPHAGITIS   Breast cancer of upper-outer quadrant of right female breast   Dehydration   Hypokalemia   Nausea with vomiting   Diarrhea   Flu-like symptoms   Nausea & vomiting   Discharge Condition: Stable and improved. Patient has been discharged home with instructions to follow with PCP in one week. She will also follow with general surgery and oncology service as a scheduled prior to admission.  Diet recommendation: Heart healthy diet  Filed Weights   08/15/14 0732  Weight: 61.236 kg (135 lb)    History of present illness:  59 y.o. female with PMH significant for breast cancer (s/p right mastectomy on 07/28/14); GERD with PUD, tobacco abuse and anxiety; who presented to ED complaining of general malaise, cough, N/V and diarrhea. Symptoms has been present for the last 10 days or so, but worsen in the last 3 days. She endorses some subjective fever lately. Patient has been started on augmentin by surgeon to treat bronchitis/early PNA and she is half done with therapy and still feeling bad. No CP, no focal weakness, no hematemesis, no hematuria, no melena and no other complaints. In ED was found to be dehydrated and with hypokalemia. Flu by PCR done and was negative. Patient still having hard time keeping things down. [facility] called to admit for observation, fluid resuscitation and electrolytes repletion.   Hospital Course:  1-flulike symptoms: Most likely secondary to bronchitis versus viral upper respiratory infection. There were no infiltrates appreciated on chest x-ray. -continue augmentin and  complete therapy as previously indicated. -influenza by PCR neg -Since the patient has postnasal drip and nasal congestion. Will start claritin on daily basis -Patient advised to limit prolonged outdoor exposure during the spring time and to stop smoking.  2-nausea/vomiting/diarrhea: most likely due to same viral infection. Worsen probably by the use of abx's -C. Diff was checked an negative -will add florastor and instructed to use over-the-counter loperamide if further diarrhea continues. -Electrolytes were repleted prior to discharge -Patient tolerating diet without any further nausea or vomiting -She will continue the use of promethazine and Zofran PRN for nausea  3-hypokalemia: due to GI loses -Repleted and within normal limits at discharge -Phosphorus and magnesium within normal limits -Patient will continue daily maintenance supplementation using 40 mEq of potassium. -Basic metabolic panel during hospital follow-up visit to be repeated to address need of further maintenance potassium  4-TOBACCO ABUSE: cessation counseling provided.  -Recommended to use nicotine patch or nicotine gums as needed to help with craving and cessation  5-GASTROESOPHAGEAL REFLUX, NO ESOPHAGITIS: will continue PPI  6-Breast cancer of upper-outer quadrant of right female breast: s/p mastectomy. -continue follow up with surgery service and oncology as schedule -plan is to start arimidex on 08/19/14  7-Dehydration: With hyponatremia due to GI loses -Resolved with fluid resuscitation -Electrolytes within normal limits at discharge -Will need basic metabolic panel to follow on electrolytes but in follow-up visit -Patient advised to keep herself well hydrated and to maintain adequate nutrition.  8-anxiety: continue PRN xanax  9-hypertension: Stable and well controlled. Will continue the use of atenolol.  Procedures:  See below for x-ray reports  Consultations:  None  Discharge Exam: Filed Vitals:    08/16/14 1341  BP: 118/77  Pulse: 78  Temp: 97.4 F (36.3 C)  Resp: 18    General: Afebrile, feeling rate; denies any lightheadedness or dizziness. No further episode of nausea/vomiting and able to keep medications and diet down without any problem. Cardiovascular: S1 and S2, no rubs, no gallops Respiratory: Good air movement, no wheezing, scattered rhonchi are appreciated Abdomen: Soft, nontender, nondistended, positive bowel sounds Musculoskeletal: No lower extremities swelling, no erythema, no cyanosis; FROM  Discharge Instructions   Discharge Instructions    Diet - low sodium heart healthy    Complete by:  As directed      Discharge instructions    Complete by:  As directed   Take medications as prescribed Arrange follow up with PCP in 1 week Keep yourself well hydrated and maintain adequate nutrition Follow up with oncology and general surgery service as instructed prior to admission Stop smoking          Current Discharge Medication List    START taking these medications   Details  benzonatate (TESSALON) 100 MG capsule Take 1 capsule (100 mg total) by mouth 3 (three) times daily as needed for cough. Qty: 20 capsule, Refills: 0    loratadine (CLARITIN) 10 MG tablet Take 1 tablet (10 mg total) by mouth daily. Qty: 30 tablet, Refills: 1    pantoprazole (PROTONIX) 40 MG tablet Take 1 tablet (40 mg total) by mouth daily at 12 noon. Qty: 30 tablet, Refills: 1    saccharomyces boulardii (FLORASTOR) 250 MG capsule Take 1 capsule (250 mg total) by mouth 2 (two) times daily. Qty: 60 capsule, Refills: 0      CONTINUE these medications which have CHANGED   Details  potassium chloride SA (K-DUR,KLOR-CON) 20 MEQ tablet Take 2 tablets (40 mEq total) by mouth daily. Qty: 40 tablet, Refills: 0   Associated Diagnoses: Breast cancer of upper-outer quadrant of right female breast      CONTINUE these medications which have NOT CHANGED   Details  acetaminophen (TYLENOL) 500  MG tablet Take 500 mg by mouth every 6 (six) hours as needed for mild pain, fever or headache.    ALPRAZolam (XANAX) 0.5 MG tablet TAKE 1 TABLET 3 TIMES A DAY AS NEEDED Qty: 90 tablet, Refills: 5    amoxicillin-clavulanate (AUGMENTIN) 875-125 MG per tablet Take 1 tablet by mouth 2 (two) times daily. Refills: 0    anastrozole (ARIMIDEX) 1 MG tablet Take 1 tablet (1 mg total) by mouth daily. Qty: 90 tablet, Refills: 3   Associated Diagnoses: Breast cancer of upper-outer quadrant of right female breast    atenolol (TENORMIN) 50 MG tablet Take 1 tablet (50 mg total) by mouth 2 (two) times daily. Qty: 60 tablet, Refills: 1    oxyCODONE-acetaminophen (ROXICET) 5-325 MG per tablet Take 1 tablet by mouth 3 (three) times daily as needed for severe pain (pain). Qty: 90 tablet, Refills: 0   Associated Diagnoses: Breast cancer of upper-outer quadrant of right female breast    PRESCRIPTION MEDICATION Patient receives her chemo treatments at the [facility] with Dr. Lindi Adie. Her last dose of Herceptin 399mg  was on 07/21/14 and she is to start taking Arimidex 1mg  daily on 08/19/14.    prochlorperazine (COMPAZINE) 10 MG tablet Take 2 mg by mouth every 6 (six) hours as needed for nausea or vomiting.    sertraline (ZOLOFT) 50 MG tablet Take 1 tablet (50 mg total) by mouth at bedtime. Qty:  90 tablet, Refills: 3    ondansetron (ZOFRAN) 8 MG tablet TAKE 1 TABLET BY MOUTH EVERY 8 HOURS AS NEEDED FOR NAUSEA/VOMITING Qty: 30 tablet, Refills: 0   Associated Diagnoses: Malignant neoplasm of upper-outer quadrant of right female breast      STOP taking these medications     LORazepam (ATIVAN) 0.5 MG tablet        Allergies  Allergen Reactions  . Codeine Itching and Nausea Only    Pt has had codeine with pre-administration to prevent reaction without any problem.    Follow-up Information    Follow up with COPLAND,JESSICA, MD. Schedule an appointment as soon as possible for a visit in  1 week.   Specialty:  Family Medicine   Contact information:   [place] [address] [phone]       The results of significant diagnostics from this hospitalization (including imaging, microbiology, ancillary and laboratory) are listed below for reference.    Significant Diagnostic Studies: Nm Sentinel Node Inj-no Rpt (breast)  07/28/2014   CLINICAL DATA: right breast cancer   Sulfur colloid was injected intradermally by the nuclear medicine  technologist for breast cancer sentinel node localization.    Dg Chest Port 1 View  08/15/2014   CLINICAL DATA:  Breast cancer, recent mastectomy. New flu like symptoms for 1 week.  EXAM: PORTABLE CHEST - 1 VIEW  COMPARISON:  02/26/2014  FINDINGS: There is no focal parenchymal opacity, pleural effusion, or pneumothorax. The heart and mediastinal contours are unremarkable. There is a left-sided Port-A-Cath in satisfactory position.  There are surgical clips in the right axilla from prior axillary dissection. Prior right mastectomy.  Mild osteoarthritis of the left glenohumeral joint.  IMPRESSION: No active disease.   Electronically Signed   By: Kathreen Devoid   On: 08/15/2014 08:00    Microbiology: Recent Results (from the past 240 hour(s))  Clostridium Difficile by PCR     Status: None   Collection Time: 08/15/14 11:12 AM  Result Value Ref Range Status   C difficile by pcr NEGATIVE NEGATIVE Final     Labs: Basic Metabolic Panel:  Recent Labs Lab 08/15/14 0750 08/15/14 1930 08/16/14 0505  NA 133*  --  138  K 2.8*  --  4.1  CL 103  --  114*  CO2 18*  --  15*  GLUCOSE 106*  --  104*  BUN 7  --  <5*  CREATININE 0.77  --  0.65  CALCIUM 8.3*  --  8.2*  MG  --   --  2.1  PHOS  --  3.2  --    Liver Function Tests:  Recent Labs Lab 08/15/14 0750 08/16/14 0505  AST 22 15  ALT 12 10  ALKPHOS 139* 129*  BILITOT 0.7 0.3  PROT 7.5 6.6  ALBUMIN 3.8 3.3*   CBC:  Recent Labs Lab 08/15/14 0750 08/16/14 0505  WBC  8.6 6.4  NEUTROABS 4.5  --   HGB 11.9* 10.5*  HCT 35.7* 32.3*  MCV 86.2 88.7  PLT 298 274   BNP (last 3 results)  Recent Labs  08/15/14 0750  BNP 24.3    Signed:  Barton Dubois  [facility] 08/16/2014, 3:14 PM

## 2014-08-16 NOTE — Evaluation (Signed)
Physical Therapy One Time Evaluation Patient Details Name: [name] MRN: [id number] DOB: 1956/02/24 Today's Date: 08/16/2014   History of Present Illness  59 y.o. female with PMH significant for breast cancer (s/p right mastectomy on 07/28/14); GERD with PUD, tobacco abuse and anxiety, RA, SVT and admitted for bronchitis vs viral URI  Clinical Impression  Patient evaluated by Physical Therapy with no further acute PT needs identified. All education has been completed and the patient has no further questions.  See below for any follow-up Physical Therapy or equipment needs. PT is signing off. Thank you for this referral.     Follow Up Recommendations No PT follow up    Equipment Recommendations  None recommended by PT    Recommendations for Other Services       Precautions / Restrictions Precautions Precautions: None      Mobility  Bed Mobility Overal bed mobility: Modified Independent                Transfers Overall transfer level: Modified independent                  Ambulation/Gait Ambulation/Gait assistance: Supervision;Modified independent (Device/Increase time) Ambulation Distance (Feet): 250 Feet Assistive device: None Gait Pattern/deviations: WFL(Within Functional Limits)     General Gait Details: HR 110 during gait, no c/o of symptoms  Stairs            Wheelchair Mobility    Modified Rankin (Stroke Patients Only)       Balance Overall balance assessment: No apparent balance deficits (not formally assessed)                                           Pertinent Vitals/Pain Pain Assessment: No/denies pain    Home Living Family/patient expects to be discharged to:: Private residence Living Arrangements: Alone Available Help at Discharge: Family;Available PRN/intermittently Type of Home: House       Home Layout: One level Home Equipment: None      Prior Function Level of Independence: Independent                Hand Dominance        Extremity/Trunk Assessment   Upper Extremity Assessment: RUE deficits/detail RUE Deficits / Details: recent R mastectomy, still adhering to restrictions         Lower Extremity Assessment: Overall WFL for tasks assessed         Communication   Communication: No difficulties  Cognition Arousal/Alertness: Awake/alert Behavior During Therapy: WFL for tasks assessed/performed Overall Cognitive Status: Within Functional Limits for tasks assessed                      General Comments      Exercises        Assessment/Plan    PT Assessment Patent does not need any further PT services  PT Diagnosis     PT Problem List    PT Treatment Interventions     PT Goals (Current goals can be found in the Care Plan section) Acute Rehab PT Goals PT Goal Formulation: All assessment and education complete, DC therapy    Frequency     Barriers to discharge        Co-evaluation               End of Session   Activity Tolerance: Patient tolerated  treatment well Patient left: in chair;with call bell/phone within reach Nurse Communication: Mobility status    Functional Assessment Tool Used: clinical judgement Functional Limitation: Mobility: Walking and moving around Mobility: Walking and Moving Around Current Status [phone]): 0 percent impaired, limited or restricted Mobility: Walking and Moving Around Goal Status [phone]): 0 percent impaired, limited or restricted Mobility: Walking and Moving Around Discharge Status [phone]): 0 percent impaired, limited or restricted    Time: 0901-0910 PT Time Calculation (min) (ACUTE ONLY): 9 min   Charges:   PT Evaluation $Initial PT Evaluation Tier I: 1 Procedure     PT G Codes:   PT G-Codes **NOT FOR INPATIENT CLASS** Functional Assessment Tool Used: clinical judgement Functional Limitation: Mobility: Walking and moving around Mobility: Walking and Moving Around Current Status  (O2703): 0 percent impaired, limited or restricted Mobility: Walking and Moving Around Goal Status (J0093): 0 percent impaired, limited or restricted Mobility: Walking and Moving Around Discharge Status (G1829): 0 percent impaired, limited or restricted    Tiffanee Mcnee,KATHrine E 08/16/2014, 9:55 AM Carmelia Bake, PT, DPT 08/16/2014 Pager: [phone]

## 2014-08-16 NOTE — Progress Notes (Signed)
UR completed 

## 2014-08-17 ENCOUNTER — Other Ambulatory Visit: Payer: Self-pay | Admitting: Cardiology

## 2014-08-18 ENCOUNTER — Telehealth: Payer: Self-pay | Admitting: *Deleted

## 2014-08-18 NOTE — Telephone Encounter (Signed)
Patient called.  She was due to have herceptin on 3/18 but had to cancel due to "respiratory flu".  She was then r/s for 3/28 but had to cancel as she was admitted to the hosptial with low K+, diarrhea.  She is home now and still on Augmentin as well as antihistamine, cough medicine, probiotic and K+.  She is doing better but has lost considerable weight (she states in the 120's).  She is beginning to eat better and feel better.  She will hold off starting on her anastrozole until Monday, hoping she will be off some of these additional medications - augmentin, antihistamine and cough medicine. Currently she is scheduled for her next herceptin on 09/01/14.  Her question is should she r/s this for sooner since her last one was on 07/21/14.  Let her know that Dr. Lindi Adie is out of the office until Monday 08/22/14, but that I will send him a message about her question and he can address this on Monday.  She is fine with this, she would not want to come before then anyway as she is still in the process of recovering from her hospitalization.   Also her new phone number is [phone].

## 2014-08-19 ENCOUNTER — Ambulatory Visit: Payer: Medicaid Other

## 2014-08-19 ENCOUNTER — Other Ambulatory Visit: Payer: Medicaid Other

## 2014-08-22 NOTE — Telephone Encounter (Signed)
LMOVM - Pt is ok to wait until 4/14 for next dose since she wants to recover from her hospital visit.  If she feels she can/wants to do herceptin earlier - call and let us know and we can schedule.  Pt to call clinic if she has any questions.

## 2014-08-24 ENCOUNTER — Telehealth: Payer: Self-pay | Admitting: *Deleted

## 2014-08-24 NOTE — Telephone Encounter (Signed)
Returned patient's call in reference to Herceptin appointments. Notified her she is not to receive Herceptin on 08-26-2014 but 09-01-2014 and again on 09-23-2014.  With this call she asked about the oral anti-estrogen pill she is to take for fiver years.  Recent Hospitalization for respiratory flu.  Was instructed not to take the anastrozole because she is on other medications.  Potassium was dangerously low (2.8 on 08-15-2014).  Taking 40 meq daily of potassium.  Please call return number is [phone] to advise on Anastrozole use.  Next scheduled F/U 09-23-2014.

## 2014-08-25 ENCOUNTER — Telehealth: Payer: Self-pay | Admitting: *Deleted

## 2014-08-25 NOTE — Telephone Encounter (Signed)
Returned patient's call in reference to taking anastrozole. Advised patient to take anastrozole as prescribed. Patient verbalized understanding.

## 2014-08-26 ENCOUNTER — Other Ambulatory Visit: Payer: Medicaid Other

## 2014-08-26 ENCOUNTER — Ambulatory Visit: Payer: Medicaid Other

## 2014-08-31 ENCOUNTER — Telehealth: Payer: Self-pay | Admitting: *Deleted

## 2014-08-31 ENCOUNTER — Other Ambulatory Visit: Payer: Self-pay | Admitting: *Deleted

## 2014-08-31 DIAGNOSIS — C50411 Malignant neoplasm of upper-outer quadrant of right female breast: Secondary | ICD-10-CM

## 2014-08-31 MED ORDER — OXYCODONE-ACETAMINOPHEN 5-325 MG PO TABS: 1.0000 | ORAL_TABLET | Freq: Three times a day (TID) | ORAL | Status: DC | PRN

## 2014-08-31 NOTE — Telephone Encounter (Signed)
TCF patient. Patient has appt. For labs and Herceptin tomorrow 09/01/14. She needs oxycodone/acetominaphen 5-325 refilled and would like script when she comes in tomorrow. Her appt for labs is at 9am . Infusion room @ 9:30am

## 2014-09-01 ENCOUNTER — Ambulatory Visit (HOSPITAL_BASED_OUTPATIENT_CLINIC_OR_DEPARTMENT_OTHER): Payer: Medicaid Other

## 2014-09-01 ENCOUNTER — Other Ambulatory Visit (HOSPITAL_BASED_OUTPATIENT_CLINIC_OR_DEPARTMENT_OTHER): Payer: Medicaid Other

## 2014-09-01 ENCOUNTER — Other Ambulatory Visit (HOSPITAL_COMMUNITY)
Admission: RE | Admit: 2014-09-01 | Discharge: 2014-09-01 | Disposition: A | Discharge status: Home / Self Care | Payer: Medicaid Other | Source: Ambulatory Visit | Attending: Hematology and Oncology | Admitting: Hematology and Oncology

## 2014-09-01 VITALS — BP 96/72 | HR 74 | Temp 97.5°F | Resp 18

## 2014-09-01 DIAGNOSIS — C50811 Malignant neoplasm of overlapping sites of right female breast: Secondary | ICD-10-CM | POA: Diagnosis not present

## 2014-09-01 DIAGNOSIS — C50419 Malignant neoplasm of upper-outer quadrant of unspecified female breast: Secondary | ICD-10-CM | POA: Diagnosis present

## 2014-09-01 DIAGNOSIS — C50919 Malignant neoplasm of unspecified site of unspecified female breast: Secondary | ICD-10-CM

## 2014-09-01 DIAGNOSIS — Z5112 Encounter for antineoplastic immunotherapy: Secondary | ICD-10-CM | POA: Diagnosis present

## 2014-09-01 DIAGNOSIS — C50411 Malignant neoplasm of upper-outer quadrant of right female breast: Secondary | ICD-10-CM

## 2014-09-01 LAB — CBC WITH DIFFERENTIAL/PLATELET
BASO%: 0.1 % (ref 0.0–2.0)
Basophils Absolute: 0 10*3/uL (ref 0.0–0.1)
EOS%: 1.9 % (ref 0.0–7.0)
Eosinophils Absolute: 0.2 10*3/uL (ref 0.0–0.5)
HEMATOCRIT: 34.9 % (ref 34.8–46.6)
HGB: 11.3 g/dL — ABNORMAL LOW (ref 11.6–15.9)
LYMPH%: 39.5 % (ref 14.0–49.7)
MCH: 28.3 pg (ref 25.1–34.0)
MCHC: 32.4 g/dL (ref 31.5–36.0)
MCV: 87.5 fL (ref 79.5–101.0)
MONO#: 0.6 10*3/uL (ref 0.1–0.9)
MONO%: 5.9 % (ref 0.0–14.0)
NEUT#: 5.1 10*3/uL (ref 1.5–6.5)
NEUT%: 52.6 % (ref 38.4–76.8)
Platelets: 291 10*3/uL (ref 145–400)
RBC: 3.99 10*6/uL (ref 3.70–5.45)
RDW: 14.7 % — ABNORMAL HIGH (ref 11.2–14.5)
WBC: 9.8 10*3/uL (ref 3.9–10.3)
lymph#: 3.9 10*3/uL — ABNORMAL HIGH (ref 0.9–3.3)

## 2014-09-01 LAB — COMPREHENSIVE METABOLIC PANEL
ALT: 14 U/L (ref 0–35)
AST: 18 U/L (ref 0–37)
Albumin: 3.8 g/dL (ref 3.5–5.2)
Alkaline Phosphatase: 131 U/L — ABNORMAL HIGH (ref 39–117)
Anion gap: 9 (ref 5–15)
BILIRUBIN TOTAL: 0.5 mg/dL (ref 0.3–1.2)
BUN: 8 mg/dL (ref 6–23)
CO2: 21 mmol/L (ref 19–32)
CREATININE: 0.85 mg/dL (ref 0.50–1.10)
Calcium: 8.6 mg/dL (ref 8.4–10.5)
Chloride: 101 mmol/L (ref 96–112)
GFR, EST AFRICAN AMERICAN: 86 mL/min — AB (ref >90)
GFR, EST NON AFRICAN AMERICAN: 74 mL/min — AB (ref >90)
GLUCOSE: 106 mg/dL — AB (ref 70–99)
POTASSIUM: 3.9 mmol/L (ref 3.5–5.1)
Sodium: 131 mmol/L — ABNORMAL LOW (ref 135–145)
Total Protein: 7.5 g/dL (ref 6.0–8.3)

## 2014-09-01 MED ORDER — ACETAMINOPHEN 325 MG PO TABS
650.0000 mg | ORAL_TABLET | Freq: Once | ORAL | Status: AC
  Administered 2014-09-01: 650 mg via ORAL

## 2014-09-01 MED ORDER — DIPHENHYDRAMINE HCL 25 MG PO CAPS
ORAL_CAPSULE | ORAL | Status: AC
  Filled 2014-09-01: qty 2

## 2014-09-01 MED ORDER — SODIUM CHLORIDE 0.9 % IJ SOLN
10.0000 mL | INTRAMUSCULAR | Status: DC | PRN
  Administered 2014-09-01: 10 mL
  Filled 2014-09-01: qty 10

## 2014-09-01 MED ORDER — ACETAMINOPHEN 325 MG PO TABS
ORAL_TABLET | ORAL | Status: AC
  Filled 2014-09-01: qty 2

## 2014-09-01 MED ORDER — SODIUM CHLORIDE 0.9 % IV SOLN
INTRAVENOUS | Status: DC
  Administered 2014-09-01: 10:00:00 via INTRAVENOUS
  Filled 2014-09-01: qty 500

## 2014-09-01 MED ORDER — SODIUM CHLORIDE 0.9 % IV SOLN
Freq: Once | INTRAVENOUS | Status: AC
  Administered 2014-09-01: 10:00:00 via INTRAVENOUS

## 2014-09-01 MED ORDER — TRASTUZUMAB CHEMO INJECTION 440 MG
6.0000 mg/kg | Freq: Once | INTRAVENOUS | Status: AC
  Administered 2014-09-01: 399 mg via INTRAVENOUS
  Filled 2014-09-01: qty 19

## 2014-09-01 MED ORDER — HEPARIN SOD (PORK) LOCK FLUSH 100 UNIT/ML IV SOLN
500.0000 [IU] | Freq: Once | INTRAVENOUS | Status: AC | PRN
  Administered 2014-09-01: 500 [IU]
  Filled 2014-09-01: qty 5

## 2014-09-01 MED ORDER — DIPHENHYDRAMINE HCL 25 MG PO CAPS
50.0000 mg | ORAL_CAPSULE | Freq: Once | ORAL | Status: AC
  Administered 2014-09-01: 50 mg via ORAL

## 2014-09-01 NOTE — Patient Instructions (Addendum)
[facility] Discharge Instructions for Patients Receiving Chemotherapy  Today you received the following chemotherapy agents:  Herceptin  To help prevent nausea and vomiting after your treatment, we encourage you to take your nausea medication as prescribed.   If you develop nausea and vomiting that is not controlled by your nausea medication, call the clinic.   BELOW ARE SYMPTOMS THAT SHOULD BE REPORTED IMMEDIATELY:  *FEVER GREATER THAN 100.5 F  *CHILLS WITH OR WITHOUT FEVER  NAUSEA AND VOMITING THAT IS NOT CONTROLLED WITH YOUR NAUSEA MEDICATION  *UNUSUAL SHORTNESS OF BREATH  *UNUSUAL BRUISING OR BLEEDING  TENDERNESS IN MOUTH AND THROAT WITH OR WITHOUT PRESENCE OF ULCERS  *URINARY PROBLEMS  *BOWEL PROBLEMS  UNUSUAL RASH Items with * indicate a potential emergency and should be followed up as soon as possible.  Feel free to call the clinic you have any questions or concerns. The clinic phone number is [phone].  Please show the CHEMO ALERT CARD at check-in to the Emergency Department and triage nurse.   

## 2014-09-06 ENCOUNTER — Other Ambulatory Visit: Payer: Self-pay | Admitting: *Deleted

## 2014-09-06 DIAGNOSIS — C50411 Malignant neoplasm of upper-outer quadrant of right female breast: Secondary | ICD-10-CM

## 2014-09-06 MED ORDER — POTASSIUM CHLORIDE CRYS ER 20 MEQ PO TBCR: 40.0000 meq | EXTENDED_RELEASE_TABLET | Freq: Every day | ORAL | Status: DC

## 2014-09-09 ENCOUNTER — Other Ambulatory Visit: Payer: Medicaid Other

## 2014-09-09 ENCOUNTER — Ambulatory Visit: Payer: Medicaid Other

## 2014-09-09 ENCOUNTER — Ambulatory Visit: Payer: Medicaid Other | Admitting: Cardiology

## 2014-09-16 ENCOUNTER — Other Ambulatory Visit: Payer: Medicaid Other

## 2014-09-16 ENCOUNTER — Ambulatory Visit: Payer: Medicaid Other

## 2014-09-19 ENCOUNTER — Ambulatory Visit: Payer: Medicaid Other | Admitting: Physician Assistant

## 2014-09-22 NOTE — Assessment & Plan Note (Signed)
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 Status post neoadjuvant chemotherapy with TCHPx2, GCHPx1, CHPx1, HPx2 , complete pathologic response with only residual DCIS 7 cm remaining. Anastrozole started 08/19/2014  Recommendation: 1. Continue maintenance Herceptin 2. Continue antiestrogen therapy with anastrozole 1 mg daily started 08/19/2014  Anastrozole toxicities:  Survivorship:Discussed the importance of physical exercise in decreasing the likelihood of breast cancer recurrence. Recommended 30 mins daily 6 days a week of either brisk walking or cycling or swimming. Encouraged patient to eat more fruits and vegetables and decrease red meat.    Return to clinic every 6 weeks for follow-up on Herceptin and anastrozole

## 2014-09-23 ENCOUNTER — Ambulatory Visit: Payer: Medicaid Other

## 2014-09-23 ENCOUNTER — Other Ambulatory Visit (HOSPITAL_BASED_OUTPATIENT_CLINIC_OR_DEPARTMENT_OTHER): Payer: Medicaid Other

## 2014-09-23 ENCOUNTER — Ambulatory Visit (HOSPITAL_BASED_OUTPATIENT_CLINIC_OR_DEPARTMENT_OTHER): Payer: Medicaid Other | Admitting: Hematology and Oncology

## 2014-09-23 ENCOUNTER — Telehealth: Payer: Self-pay | Admitting: Hematology and Oncology

## 2014-09-23 VITALS — BP 103/72 | HR 69 | Temp 97.7°F | Resp 18 | Ht 64.0 in | Wt 132.6 lb

## 2014-09-23 DIAGNOSIS — R110 Nausea: Secondary | ICD-10-CM | POA: Diagnosis not present

## 2014-09-23 DIAGNOSIS — Z170 Estrogen receptor positive status [ER+]: Secondary | ICD-10-CM

## 2014-09-23 DIAGNOSIS — C50811 Malignant neoplasm of overlapping sites of right female breast: Secondary | ICD-10-CM

## 2014-09-23 DIAGNOSIS — C50411 Malignant neoplasm of upper-outer quadrant of right female breast: Secondary | ICD-10-CM

## 2014-09-23 DIAGNOSIS — R197 Diarrhea, unspecified: Secondary | ICD-10-CM

## 2014-09-23 DIAGNOSIS — R530 Neoplastic (malignant) related fatigue: Secondary | ICD-10-CM | POA: Diagnosis not present

## 2014-09-23 LAB — CBC WITH DIFFERENTIAL/PLATELET
BASO%: 0.4 % (ref 0.0–2.0)
Basophils Absolute: 0 10*3/uL (ref 0.0–0.1)
EOS ABS: 0.1 10*3/uL (ref 0.0–0.5)
EOS%: 1.1 % (ref 0.0–7.0)
HCT: 34 % — ABNORMAL LOW (ref 34.8–46.6)
HGB: 11.6 g/dL (ref 11.6–15.9)
LYMPH#: 2.4 10*3/uL (ref 0.9–3.3)
LYMPH%: 25.9 % (ref 14.0–49.7)
MCH: 28.5 pg (ref 25.1–34.0)
MCHC: 34.3 g/dL (ref 31.5–36.0)
MCV: 83.3 fL (ref 79.5–101.0)
MONO#: 0.5 10*3/uL (ref 0.1–0.9)
MONO%: 5 % (ref 0.0–14.0)
NEUT%: 67.6 % (ref 38.4–76.8)
NEUTROS ABS: 6.2 10*3/uL (ref 1.5–6.5)
Platelets: 336 10*3/uL (ref 145–400)
RBC: 4.08 10*6/uL (ref 3.70–5.45)
RDW: 14.6 % — AB (ref 11.2–14.5)
WBC: 9.2 10*3/uL (ref 3.9–10.3)

## 2014-09-23 LAB — COMPREHENSIVE METABOLIC PANEL (CC13)
ALK PHOS: 168 U/L — AB (ref 40–150)
ALT: 21 U/L (ref 0–55)
ANION GAP: 12 meq/L — AB (ref 3–11)
AST: 16 U/L (ref 5–34)
Albumin: 3.9 g/dL (ref 3.5–5.0)
BUN: 5.5 mg/dL — ABNORMAL LOW (ref 7.0–26.0)
CO2: 16 meq/L — AB (ref 22–29)
Calcium: 8.8 mg/dL (ref 8.4–10.4)
Chloride: 98 mEq/L (ref 98–109)
Creatinine: 0.8 mg/dL (ref 0.6–1.1)
EGFR: 82 mL/min/{1.73_m2} — ABNORMAL LOW (ref >90)
Glucose: 105 mg/dl (ref 70–140)
Potassium: 4.4 mEq/L (ref 3.5–5.1)
SODIUM: 126 meq/L — AB (ref 136–145)
TOTAL PROTEIN: 7.3 g/dL (ref 6.4–8.3)
Total Bilirubin: 0.43 mg/dL (ref 0.20–1.20)

## 2014-09-23 MED ORDER — DIPHENOXYLATE-ATROPINE 2.5-0.025 MG PO TABS: 1.0000 | ORAL_TABLET | Freq: Four times a day (QID) | ORAL | Status: DC | PRN

## 2014-09-23 NOTE — Telephone Encounter (Signed)
Appointments made and avs printed for patient °

## 2014-09-23 NOTE — Progress Notes (Signed)
Patient Care Team: Darreld Mclean, MD as PCP - General (Family Medicine) Nicholas Lose, MD as Consulting Physician (Hematology and Oncology) [name], MD as Consulting Physician (General Surgery) Thea Silversmith, MD as Consulting Physician (Radiation Oncology)  DIAGNOSIS: Breast cancer of upper-outer quadrant of right female breast   Staging form: Breast, AJCC 7th Edition     Clinical: Stage IIA (T2, N0, cM0) - Unsigned       Staging comments: Staged at breast conference 12/29/13.      Pathologic stage from 08/01/2014: Stage 0 (yTis (DCIS), N0, cM0) - Signed by Enid Cutter, MD on 08/07/2014       Staging comments: Staged on final mastectomy specimen by Dr. Donato Heinz    SUMMARY OF ONCOLOGIC HISTORY:   Breast cancer of upper-outer quadrant of right female breast   12/16/2013 Mammogram 1. A palpable 4.1 cm diameter mass/asymmetry with associated coarse heterogeneous microcalcifications at 11 o'clock 2 cm from the right nipple demonstrates mammographic and sonographic features highly suggestive of malignancy.    12/16/2013 Initial Biopsy Breast cancer of upper-outer quadrant of right female breast; Invasive Ductal Cancer Er 100%, PR: 0%; Her2 Positive Ratio 4.28: Ki 67:24%;    12/23/2013 Breast MRI Right Breast 4.4 x 3 x 2.5 cm and a sub cm satellite lesion. No LN   01/28/2014 - 05/27/2014 Neo-Adjuvant Chemotherapy TC HP x2 cycles ( developed hand-foot syndrome) GC HP x1 cycle (developed dehydration and renal failure) CHP from 04/08/2014   07/21/2014 -  Chemotherapy Herceptin maintenance therapy   07/28/2014 Surgery Right breast mastectomy: DCIS 7 cm, 4 lymph nodes negative, no invasive breast cancer was identified, complete pathologic response    CHIEF COMPLIANT: Diarrhea, fatigue, nausea  INTERVAL HISTORY: [name] is a 59 year old lady with above-mentioned history of right-sided breast cancer currently on adjuvant Herceptin anastrozole. She is experiencing side effects of treatment including  diarrhea that does not that side. It is unclear if it is related to Herceptin or anastrozole. She also has emotional fatigue as well as physical fatigue. She would like to get a break from these treatments.  REVIEW OF SYSTEMS:   Constitutional: Denies fevers, chills or abnormal weight loss Eyes: Denies blurriness of vision Ears, nose, mouth, throat, and face: Denies mucositis or sore throat Respiratory: Denies cough, dyspnea or wheezes Cardiovascular: Denies palpitation, chest discomfort or lower extremity swelling Gastrointestinal:  Nausea and diarrhea Skin: Denies abnormal skin rashes Lymphatics: Denies new lymphadenopathy or easy bruising Neurological:Denies numbness, tingling or new weaknesses Behavioral/Psych: Mood is stable, no new changes  Breast:  denies any pain or lumps or nodules in either breasts All other systems were reviewed with the patient and are negative.  I have reviewed the past medical history, past surgical history, social history and family history with the patient and they are unchanged from previous note.  ALLERGIES:  is allergic to codeine.  MEDICATIONS:  Current Outpatient Prescriptions  Medication Sig Dispense Refill  . ALPRAZolam (XANAX) 0.5 MG tablet TAKE 1 TABLET 3 TIMES A DAY AS NEEDED (Patient taking differently: Take 0.25-0.5 mg by mouth 3 (three) times daily as needed for anxiety. ) 90 tablet 5  . anastrozole (ARIMIDEX) 1 MG tablet Take 1 tablet (1 mg total) by mouth daily. 90 tablet 3  . atenolol (TENORMIN) 50 MG tablet TAKE 1 TABLET (50 MG TOTAL) BY MOUTH 2 (TWO) TIMES DAILY. 60 tablet 1  . ondansetron (ZOFRAN) 8 MG tablet TAKE 1 TABLET BY MOUTH EVERY 8 HOURS AS NEEDED FOR NAUSEA/VOMITING 30 tablet 0  .  potassium chloride SA (K-DUR,KLOR-CON) 20 MEQ tablet Take 2 tablets (40 mEq total) by mouth daily. 40 tablet 0  . PRESCRIPTION MEDICATION Patient receives her chemo treatments at the [facility] with Dr. Lindi Adie. Her last dose of  Herceptin 382m was on 07/21/14 and she is to start taking Arimidex 12mdaily on 08/19/14.    . Marland Kitchenrochlorperazine (COMPAZINE) 10 MG tablet Take 2 mg by mouth every 6 (six) hours as needed for nausea or vomiting.    . saccharomyces boulardii (FLORASTOR) 250 MG capsule Take 1 capsule (250 mg total) by mouth 2 (two) times daily. 60 capsule 0  . sertraline (ZOLOFT) 50 MG tablet Take 1 tablet (50 mg total) by mouth at bedtime. (Patient taking differently: Take 25 mg by mouth at bedtime. ) 90 tablet 3  . acetaminophen (TYLENOL) 500 MG tablet Take 500 mg by mouth every 6 (six) hours as needed for mild pain, fever or headache.    . oxyCODONE-acetaminophen (ROXICET) 5-325 MG per tablet Take 1 tablet by mouth 3 (three) times daily as needed for severe pain (pain). (Patient not taking: Reported on 09/23/2014) 90 tablet 0  . pantoprazole (PROTONIX) 40 MG tablet Take 1 tablet (40 mg total) by mouth daily at 12 noon. (Patient not taking: Reported on 09/23/2014) 30 tablet 1   No current facility-administered medications for this visit.    PHYSICAL EXAMINATION: ECOG PERFORMANCE STATUS: 1 - Symptomatic but completely ambulatory  Filed Vitals:   09/23/14 0853  BP: 103/72  Pulse: 69  Temp: 97.7 F (36.5 C)  Resp: 18   Filed Weights   09/23/14 0853  Weight: 132 lb 9.6 oz (60.147 kg)    GENERAL:alert, no distress and comfortable SKIN: skin color, texture, turgor are normal, no rashes or significant lesions EYES: normal, Conjunctiva are pink and non-injected, sclera clear OROPHARYNX:no exudate, no erythema and lips, buccal mucosa, and tongue normal  NECK: supple, thyroid normal size, non-tender, without nodularity LYMPH:  no palpable lymphadenopathy in the cervical, axillary or inguinal LUNGS: clear to auscultation and percussion with normal breathing effort HEART: regular rate & rhythm and no murmurs and no lower extremity edema ABDOMEN:abdomen soft, non-tender and normal bowel sounds Musculoskeletal:no  cyanosis of digits and no clubbing  NEURO: alert & oriented x 3 with fluent speech, no focal motor/sensory deficits   LABORATORY DATA:  I have reviewed the data as listed   Chemistry      Component Value Date/Time   NA 131* 09/01/2014 0914   NA 138 07/21/2014 0842   K 3.9 09/01/2014 0914   K 3.2* 07/21/2014 0842   CL 101 09/01/2014 0914   CO2 21 09/01/2014 0914   CO2 23 07/21/2014 0842   BUN 8 09/01/2014 0914   BUN 5.0* 07/21/2014 0842   CREATININE 0.85 09/01/2014 0914   CREATININE 0.7 07/21/2014 0842   CREATININE 0.76 04/03/2013 1420      Component Value Date/Time   CALCIUM 8.6 09/01/2014 0914   CALCIUM 8.8 07/21/2014 0842   ALKPHOS 131* 09/01/2014 0914   ALKPHOS 169* 07/21/2014 0842   AST 18 09/01/2014 0914   AST 18 07/21/2014 0842   ALT 14 09/01/2014 0914   ALT 9 07/21/2014 0842   BILITOT 0.5 09/01/2014 0914   BILITOT 0.36 07/21/2014 0842       Lab Results  Component Value Date   WBC 9.2 09/23/2014   HGB 11.6 09/23/2014   HCT 34.0* 09/23/2014   MCV 83.3 09/23/2014   PLT 336 09/23/2014  NEUTROABS 6.2 09/23/2014   ASSESSMENT & PLAN:  Breast cancer of 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 Status post neoadjuvant chemotherapy with TCHPx2, GCHPx1, CHPx1, HPx2 , complete pathologic response with only residual DCIS 7 cm remaining. Anastrozole started 08/19/2014  Recommendation: 1. Continue maintenance Herceptin 2. Continue antiestrogen therapy with anastrozole 1 mg daily started 08/19/2014  Herceptin toxicities: 1. Diarrhea 2. Nausea 3. Decreased sleep and increased fatigue  Anastrozole toxicities: Severe profound fatigue, diffuse body aches,occasional hot flashes and diarrhea. Will discontinue anastrozole and give her  3 month break from antiestrogen therapy. When she comes back we will resume her with tamoxifen.  Survivorship:Discussed the importance of physical exercise in decreasing the likelihood of breast cancer recurrence. Recommended 30 mins daily 6 days a week of either brisk walking or cycling or swimming. Encouraged patient to eat more fruits and vegetables and decrease red meat.    Return to clinic every 6 weeks for follow-up on Herceptin and anastrozole    No orders of the defined types were placed in this encounter.   The patient has a good understanding of the overall plan. she agrees with it. she will call with any problems that may develop before the next visit here.   Rulon Eisenmenger, MD

## 2014-09-23 NOTE — Progress Notes (Signed)
No treatment

## 2014-09-26 ENCOUNTER — Other Ambulatory Visit: Payer: Self-pay | Admitting: Hematology and Oncology

## 2014-09-26 DIAGNOSIS — C50411 Malignant neoplasm of upper-outer quadrant of right female breast: Secondary | ICD-10-CM

## 2014-09-30 ENCOUNTER — Ambulatory Visit: Payer: Medicaid Other

## 2014-09-30 ENCOUNTER — Other Ambulatory Visit: Payer: Medicaid Other

## 2014-10-03 ENCOUNTER — Encounter: Payer: Self-pay | Admitting: *Deleted

## 2014-10-03 NOTE — Consult Note (Signed)
History of Present Illness [place] Surger/PA [name], MD 08/08/2014  The patient is a 58 yo female who present with breast cancer.  Previous history she was diagnosed with right breast cancer in July 2015.  She underwent neoadjuvant chemotherapy.  Her tumor was 4.4cm with a satellite lesion.  There were no abnormal lymph nodes.  This is an ER positive tumor, her -2 over expressed, with T3 NO MO stage IIa breast cancer.  She has had multiple side effects from her chemotherapy.  She had hand=foot syndrome, dehydration, renal failure, bone pain, she has finally gotten to a stable point on maintenance Herceptin.  Her MRI didn't show significant treatment response.  Based on the location of her tumor in her satellite lesion, we did review this at multidisci[plinary conference and recommend a mastectomy.  She is slowly increasing her energy level.  She is ready to have surgery.  Pt was concerned about having fevers and redness at her drain site.  She also has some right chest pain and productive cough.  Her temp has been up to 101.1.

## 2014-10-14 ENCOUNTER — Ambulatory Visit: Payer: Medicaid Other | Admitting: Hematology and Oncology

## 2014-10-14 ENCOUNTER — Ambulatory Visit: Payer: Medicaid Other

## 2014-10-14 ENCOUNTER — Telehealth: Payer: Self-pay | Admitting: Hematology and Oncology

## 2014-10-14 ENCOUNTER — Other Ambulatory Visit: Payer: Medicaid Other

## 2014-10-14 NOTE — Telephone Encounter (Signed)
Patient called in to cancel todays appointments as she has a job interview and will call back to r/s

## 2014-10-19 ENCOUNTER — Other Ambulatory Visit: Payer: Self-pay | Admitting: Hematology and Oncology

## 2014-10-19 DIAGNOSIS — C50411 Malignant neoplasm of upper-outer quadrant of right female breast: Secondary | ICD-10-CM

## 2014-10-19 NOTE — Assessment & Plan Note (Signed)
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 Status post neoadjuvant chemotherapy with TCHPx2, GCHPx1, CHPx1, HPx2 , complete pathologic response with only residual DCIS 7 cm remaining. Anastrozole started 08/19/2014  Recommendation: 1. Continue maintenance Herceptin 2. Antiestrogen therapy with anastrozole 1 mg daily started 08/19/2014 (holding for side effects)  Herceptin toxicities: 1. Diarrhea 2. Nausea 3. Decreased sleep and increased fatigue  Anastrozole toxicities: Severe profound fatigue, diffuse body aches,occasional hot flashes and diarrhea. We discontinued anastrozole and gave her 3 month break from antiestrogen therapy. She will resume tamoxifen.

## 2014-10-20 ENCOUNTER — Other Ambulatory Visit (HOSPITAL_BASED_OUTPATIENT_CLINIC_OR_DEPARTMENT_OTHER): Payer: Medicaid Other

## 2014-10-20 ENCOUNTER — Ambulatory Visit (HOSPITAL_BASED_OUTPATIENT_CLINIC_OR_DEPARTMENT_OTHER): Payer: Medicaid Other | Admitting: Hematology and Oncology

## 2014-10-20 ENCOUNTER — Telehealth: Payer: Self-pay | Admitting: Hematology and Oncology

## 2014-10-20 VITALS — BP 127/74 | HR 64 | Temp 98.2°F | Resp 18 | Ht 64.0 in | Wt 133.9 lb

## 2014-10-20 DIAGNOSIS — R110 Nausea: Secondary | ICD-10-CM

## 2014-10-20 DIAGNOSIS — G629 Polyneuropathy, unspecified: Secondary | ICD-10-CM | POA: Diagnosis not present

## 2014-10-20 DIAGNOSIS — C50411 Malignant neoplasm of upper-outer quadrant of right female breast: Secondary | ICD-10-CM | POA: Diagnosis present

## 2014-10-20 DIAGNOSIS — C50811 Malignant neoplasm of overlapping sites of right female breast: Secondary | ICD-10-CM | POA: Diagnosis not present

## 2014-10-20 DIAGNOSIS — R530 Neoplastic (malignant) related fatigue: Secondary | ICD-10-CM

## 2014-10-20 DIAGNOSIS — Z170 Estrogen receptor positive status [ER+]: Secondary | ICD-10-CM

## 2014-10-20 DIAGNOSIS — R197 Diarrhea, unspecified: Secondary | ICD-10-CM

## 2014-10-20 DIAGNOSIS — E876 Hypokalemia: Secondary | ICD-10-CM | POA: Diagnosis not present

## 2014-10-20 LAB — COMPREHENSIVE METABOLIC PANEL (CC13)
ALBUMIN: 4 g/dL (ref 3.5–5.0)
ALK PHOS: 181 U/L — AB (ref 40–150)
ALT: 11 U/L (ref 0–55)
AST: 16 U/L (ref 5–34)
Anion Gap: 10 mEq/L (ref 3–11)
BUN: 7.2 mg/dL (ref 7.0–26.0)
CHLORIDE: 96 meq/L — AB (ref 98–109)
CO2: 22 meq/L (ref 22–29)
Calcium: 9 mg/dL (ref 8.4–10.4)
Creatinine: 0.8 mg/dL (ref 0.6–1.1)
EGFR: 82 mL/min/{1.73_m2} — AB (ref >90)
GLUCOSE: 103 mg/dL (ref 70–140)
Potassium: 4.7 mEq/L (ref 3.5–5.1)
Sodium: 128 mEq/L — ABNORMAL LOW (ref 136–145)
Total Bilirubin: 0.51 mg/dL (ref 0.20–1.20)
Total Protein: 7.2 g/dL (ref 6.4–8.3)

## 2014-10-20 LAB — CBC WITH DIFFERENTIAL/PLATELET
BASO%: 0.3 % (ref 0.0–2.0)
Basophils Absolute: 0 10*3/uL (ref 0.0–0.1)
EOS%: 0.8 % (ref 0.0–7.0)
Eosinophils Absolute: 0.1 10*3/uL (ref 0.0–0.5)
HEMATOCRIT: 35 % (ref 34.8–46.6)
HEMOGLOBIN: 12.1 g/dL (ref 11.6–15.9)
LYMPH%: 24.2 % (ref 14.0–49.7)
MCH: 28.9 pg (ref 25.1–34.0)
MCHC: 34.5 g/dL (ref 31.5–36.0)
MCV: 83.8 fL (ref 79.5–101.0)
MONO#: 0.6 10*3/uL (ref 0.1–0.9)
MONO%: 5.7 % (ref 0.0–14.0)
NEUT#: 7 10*3/uL — ABNORMAL HIGH (ref 1.5–6.5)
NEUT%: 69 % (ref 38.4–76.8)
Platelets: 297 10*3/uL (ref 145–400)
RBC: 4.17 10*6/uL (ref 3.70–5.45)
RDW: 15.3 % — ABNORMAL HIGH (ref 11.2–14.5)
WBC: 10.1 10*3/uL (ref 3.9–10.3)
lymph#: 2.4 10*3/uL (ref 0.9–3.3)

## 2014-10-20 NOTE — Progress Notes (Signed)
Patient Care Team: Darreld Mclean, MD as PCP - General (Family Medicine) Nicholas Lose, MD as Consulting Physician (Hematology and Oncology) [name], MD as Consulting Physician (General Surgery) Thea Silversmith, MD as Consulting Physician (Radiation Oncology)  DIAGNOSIS: Breast cancer of upper-outer quadrant of right female breast   Staging form: Breast, AJCC 7th Edition     Clinical: Stage IIA (T2, N0, cM0) - Unsigned       Staging comments: Staged at breast conference 12/29/13.      Pathologic stage from 08/01/2014: Stage 0 (yTis (DCIS), N0, cM0) - Signed by Enid Cutter, MD on 08/07/2014       Staging comments: Staged on final mastectomy specimen by Dr. Donato Heinz    SUMMARY OF ONCOLOGIC HISTORY:   Breast cancer of upper-outer quadrant of right female breast   12/16/2013 Mammogram 1. A palpable 4.1 cm diameter mass/asymmetry with associated coarse heterogeneous microcalcifications at 11 o'clock 2 cm from the right nipple demonstrates mammographic and sonographic features highly suggestive of malignancy.    12/16/2013 Initial Biopsy Breast cancer of upper-outer quadrant of right female breast; Invasive Ductal Cancer Er 100%, PR: 0%; Her2 Positive Ratio 4.28: Ki 67:24%;    12/23/2013 Breast MRI Right Breast 4.4 x 3 x 2.5 cm and a sub cm satellite lesion. No LN   01/28/2014 - 05/27/2014 Neo-Adjuvant Chemotherapy TC HP x2 cycles ( developed hand-foot syndrome) GC HP x1 cycle (developed dehydration and renal failure) CHP from 04/08/2014   07/21/2014 -  Chemotherapy Herceptin maintenance therapy   07/28/2014 Surgery Right breast mastectomy: DCIS 7 cm, 4 lymph nodes negative, no invasive breast cancer was identified, complete pathologic response   08/19/2014 -  Anti-estrogen oral therapy Anastrozole 1 mg daily stopped 09/23/2014 due to diffuse body aches, diarrhea, nausea, fatigue we will switch her to tamoxifen in August 2016    CHIEF COMPLIANT: One-month follow-up after stopping tamoxifen  INTERVAL  HISTORY: [name] is a 59 year old above-mentioned history of right breast cancer who had a complete pathologic response to chemotherapy and was on Herceptin maintenance along with anastrozole but she could not tolerate antiestrogen therapy and it has been on hold since May 2016. She reports that she is slightly better than last month but not a whole lot. He continues to feel weak and some days are worse than others. She is also feeling more depressed and worries about her cancer is coming back. Continues to have intermittent diarrhea and intermittent nausea.  REVIEW OF SYSTEMS:   Constitutional: Denies fevers, chills or abnormal weight loss Eyes: Denies blurriness of vision Ears, nose, mouth, throat, and face: Denies mucositis or sore throat Respiratory: Denies cough, dyspnea or wheezes Cardiovascular: Denies palpitation, chest discomfort or lower extremity swelling Gastrointestinal:  Diarrhea and nausea Skin: Denies abnormal skin rashes Lymphatics: Denies new lymphadenopathy or easy bruising Neurological:Denies numbness, tingling or new weaknesses Behavioral/Psych: Mood is stable, no new changes   All other systems were reviewed with the patient and are negative.  I have reviewed the past medical history, past surgical history, social history and family history with the patient and they are unchanged from previous note.  ALLERGIES:  is allergic to codeine.  MEDICATIONS:  Current Outpatient Prescriptions  Medication Sig Dispense Refill  . acetaminophen (TYLENOL) 500 MG tablet Take 500 mg by mouth every 6 (six) hours as needed for mild pain, fever or headache.    . ALPRAZolam (XANAX) 0.5 MG tablet TAKE 1 TABLET 3 TIMES A DAY AS NEEDED (Patient taking differently: Take 0.25-0.5 mg  by mouth 3 (three) times daily as needed for anxiety. ) 90 tablet 5  . anastrozole (ARIMIDEX) 1 MG tablet Take 1 tablet (1 mg total) by mouth daily. 90 tablet 3  . atenolol (TENORMIN) 50 MG tablet TAKE 1  TABLET (50 MG TOTAL) BY MOUTH 2 (TWO) TIMES DAILY. 60 tablet 1  . KLOR-CON M20 20 MEQ tablet TAKE 2 TABLETS (40 MEQ TOTAL) BY MOUTH DAILY. 60 tablet 0  . ondansetron (ZOFRAN) 8 MG tablet TAKE 1 TABLET BY MOUTH EVERY 8 HOURS AS NEEDED FOR NAUSEA/VOMITING 30 tablet 0  . PRESCRIPTION MEDICATION Patient receives her chemo treatments at the [facility] with Dr. Lindi Adie. Her last dose of Herceptin 361m was on 07/21/14 and she is to start taking Arimidex 155mdaily on 08/19/14.    . Marland Kitchenrochlorperazine (COMPAZINE) 10 MG tablet Take 2 mg by mouth every 6 (six) hours as needed for nausea or vomiting.    . sertraline (ZOLOFT) 50 MG tablet Take 1 tablet (50 mg total) by mouth at bedtime. (Patient taking differently: Take 25 mg by mouth at bedtime. ) 90 tablet 3  . diphenoxylate-atropine (LOMOTIL) 2.5-0.025 MG per tablet Take 1 tablet by mouth 4 (four) times daily as needed for diarrhea or loose stools. (Patient not taking: Reported on 10/20/2014) 30 tablet 1  . oxyCODONE-acetaminophen (ROXICET) 5-325 MG per tablet Take 1 tablet by mouth 3 (three) times daily as needed for severe pain (pain). (Patient not taking: Reported on 09/23/2014) 90 tablet 0  . pantoprazole (PROTONIX) 40 MG tablet Take 1 tablet (40 mg total) by mouth daily at 12 noon. (Patient not taking: Reported on 09/23/2014) 30 tablet 1  . saccharomyces boulardii (FLORASTOR) 250 MG capsule Take 1 capsule (250 mg total) by mouth 2 (two) times daily. (Patient not taking: Reported on 10/20/2014) 60 capsule 0   No current facility-administered medications for this visit.    PHYSICAL EXAMINATION: ECOG PERFORMANCE STATUS: 1 - Symptomatic but completely ambulatory  Filed Vitals:   10/20/14 0925  BP: 127/74  Pulse: 64  Temp: 98.2 F (36.8 C)  Resp: 18   Filed Weights   10/20/14 0925  Weight: 133 lb 14.4 oz (60.737 kg)    GENERAL:alert, no distress and comfortable SKIN: skin color, texture, turgor are normal, no rashes or significant  lesions EYES: normal, Conjunctiva are pink and non-injected, sclera clear OROPHARYNX:no exudate, no erythema and lips, buccal mucosa, and tongue normal  NECK: supple, thyroid normal size, non-tender, without nodularity LYMPH:  no palpable lymphadenopathy in the cervical, axillary or inguinal LUNGS: clear to auscultation and percussion with normal breathing effort HEART: regular rate & rhythm and no murmurs and no lower extremity edema ABDOMEN:abdomen soft, non-tender and normal bowel sounds Musculoskeletal:no cyanosis of digits and no clubbing  NEURO: alert & oriented x 3 with fluent speech, grade 1 peripheral neuropathy   LABORATORY DATA:  I have reviewed the data as listed   Chemistry      Component Value Date/Time   NA 126* 09/23/2014 0827   NA 131* 09/01/2014 0914   K 4.4 09/23/2014 0827   K 3.9 09/01/2014 0914   CL 101 09/01/2014 0914   CO2 16* 09/23/2014 0827   CO2 21 09/01/2014 0914   BUN 5.5* 09/23/2014 0827   BUN 8 09/01/2014 0914   CREATININE 0.8 09/23/2014 0827   CREATININE 0.85 09/01/2014 0914   CREATININE 0.76 04/03/2013 1420      Component Value Date/Time   CALCIUM 8.8 09/23/2014 0827   CALCIUM  8.6 09/01/2014 0914   ALKPHOS 168* 09/23/2014 0827   ALKPHOS 131* 09/01/2014 0914   AST 16 09/23/2014 0827   AST 18 09/01/2014 0914   ALT 21 09/23/2014 0827   ALT 14 09/01/2014 0914   BILITOT 0.43 09/23/2014 0827   BILITOT 0.5 09/01/2014 0914       Lab Results  Component Value Date   WBC 10.1 10/20/2014   HGB 12.1 10/20/2014   HCT 35.0 10/20/2014   MCV 83.8 10/20/2014   PLT 297 10/20/2014   NEUTROABS 7.0* 10/20/2014    ASSESSMENT & PLAN:  Breast cancer of 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 Status post  neoadjuvant chemotherapy with TCHPx2, GCHPx1, CHPx1, HPx2 , complete pathologic response with only residual DCIS 7 cm remaining. Anastrozole started 08/19/2014 stopped 09/18/2014  Recommendation: 1. Continue maintenance Herceptin was also on hold resuming 10/27/2014 2. Antiestrogen therapy with anastrozole 1 mg daily started 08/19/2014 (holding for side effects)  Herceptin toxicities: 1. Diarrhea 2. Nausea 3. Decreased sleep and increased fatigue  Neuropathy: Slowly improving with time Hypokalemia: Today potassium is 4.7. Patient reports that if she does not take potassium she feels weak Depression: Currently on Zoloft. Arthritis: Patient finally came off Percocets.  Anastrozole toxicities: Severe profound fatigue, diffuse body aches,occasional hot flashes and diarrhea. We discontinued anastrozole and gave her 3 month break from antiestrogen therapy. She will resume tamoxifen probably in September 2016 after she completes with Herceptin.    No orders of the defined types were placed in this encounter.   The patient has a good understanding of the overall plan. she agrees with it. she will call with any problems that may develop before the next visit here.   Rulon Eisenmenger, MD

## 2014-10-20 NOTE — Telephone Encounter (Signed)
Appointments made and avs printed for patient °

## 2014-10-21 ENCOUNTER — Other Ambulatory Visit: Payer: Medicaid Other

## 2014-10-21 ENCOUNTER — Ambulatory Visit: Payer: Medicaid Other

## 2014-10-26 ENCOUNTER — Other Ambulatory Visit: Payer: Self-pay | Admitting: *Deleted

## 2014-10-27 ENCOUNTER — Ambulatory Visit: Payer: Medicaid Other

## 2014-10-27 ENCOUNTER — Other Ambulatory Visit: Payer: Medicaid Other

## 2014-10-27 ENCOUNTER — Telehealth: Payer: Self-pay | Admitting: Hematology and Oncology

## 2014-10-27 NOTE — Telephone Encounter (Signed)
Appointments cancelled per patient as she called and left a message to cancel her lab and chemo today due to being ill,diane is aware and i have called and left her a message to call when she would like to reschedule  anne

## 2014-10-28 ENCOUNTER — Telehealth: Payer: Self-pay | Admitting: Hematology and Oncology

## 2014-10-28 NOTE — Telephone Encounter (Signed)
Appointments made and patient is aware of echo

## 2014-11-08 ENCOUNTER — Other Ambulatory Visit: Payer: Self-pay | Admitting: Cardiology

## 2014-11-08 ENCOUNTER — Other Ambulatory Visit: Payer: Self-pay | Admitting: Family Medicine

## 2014-11-09 ENCOUNTER — Telehealth: Payer: Self-pay

## 2014-11-09 ENCOUNTER — Other Ambulatory Visit: Payer: Self-pay | Admitting: Family Medicine

## 2014-11-09 ENCOUNTER — Telehealth: Payer: Self-pay | Admitting: Hematology and Oncology

## 2014-11-09 NOTE — Telephone Encounter (Signed)
Alprazolam Rx was faxed to pharmacy.

## 2014-11-09 NOTE — Telephone Encounter (Signed)
Patient called and canceled her ECHO for 06/23 & lab/hercp 06/30. Patient stated she is looking for a job and that she is to worried and didn't have minutes on her phone to reschedule. Sent MD message informing of patient request.

## 2014-11-10 ENCOUNTER — Other Ambulatory Visit (HOSPITAL_COMMUNITY): Payer: Medicaid Other

## 2014-11-17 ENCOUNTER — Other Ambulatory Visit: Payer: Medicaid Other

## 2014-11-17 ENCOUNTER — Ambulatory Visit: Payer: Medicaid Other

## 2014-11-22 ENCOUNTER — Encounter: Payer: Self-pay | Admitting: Physician Assistant

## 2014-11-22 ENCOUNTER — Ambulatory Visit (INDEPENDENT_AMBULATORY_CARE_PROVIDER_SITE_OTHER): Payer: Medicaid Other | Admitting: Physician Assistant

## 2014-11-22 VITALS — BP 104/70 | HR 70 | Ht 64.0 in | Wt 134.0 lb

## 2014-11-22 DIAGNOSIS — I471 Supraventricular tachycardia: Secondary | ICD-10-CM

## 2014-11-22 DIAGNOSIS — Z720 Tobacco use: Secondary | ICD-10-CM | POA: Diagnosis not present

## 2014-11-22 DIAGNOSIS — F17200 Nicotine dependence, unspecified, uncomplicated: Secondary | ICD-10-CM

## 2014-11-22 MED ORDER — ATENOLOL 50 MG PO TABS: 50.0000 mg | ORAL_TABLET | Freq: Two times a day (BID) | ORAL | Status: DC

## 2014-11-22 NOTE — Progress Notes (Signed)
Patient ID: [name], female   DOB: 21-Jul-1955, 59 y.o.   MRN: [id number]    Date:  11/22/2014   ID:  [name], [name] 05/26/1955, MRN [id number]  PCP:  [name] Blinks, MD  Primary Cardiologist:  [name]  Chief Complaint  Patient presents with  . Follow-up    no chest pain-occassional palpitations, shortness of breath-with exertion, no edema, pain in legs-has arthritis in legs, no cramping in legs, no lightheadedness, no dizziness     History of Present Illness: [name] is a 59 y.o. female history of continued tobacco abuse, PSVT, fibromyalgia, rheumatoid arthritis, anxiety, Hepatitis C, breast cancer, right breast mastectomy March 2016.  She's been on chemotherapy during the last year.  Echocardiogram January 2016 revealed an ejection fraction is 56%, moderate LVH grade 1 diastolic dysfunction.  Patient presents for a follow-up office visit. She needs to get her atenolol refilled and she has not been seen in about 18 months.  She reports feeling quite well to the best she's felt in quite some time. She does have rare palpitations that last for 1-2 seconds. She still has chronic back pain.  She currently denies nausea, vomiting, fever, chest pain, shortness of breath unless exerting herself, orthopnea, dizziness, PND, cough, congestion, abdominal pain, hematochezia, melena, lower extremity edema, claudication.  Wt Readings from Last 3 Encounters:  11/22/14 134 lb (60.782 kg)  10/20/14 133 lb 14.4 oz (60.737 kg)  09/23/14 132 lb 9.6 oz (60.147 kg)     Past Medical History  Diagnosis Date  . 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), hypoglycemia 2/04-resolved with PO calcium + D, SVT, was treated with iterferon and ribavirin-10/3-2/04  . PUD (peptic ulcer disease)   . Fibromyalgia   . Rheumatoid arthritis(714.0)     possible  . SVT (supraventricular tachycardia)   . Depression   . Osteoporosis     . Hepatitis C     Pt was given too much interferon - critical care x2d, 5 days total hosp  . Anxiety   . Wears glasses   . Heavy smoker   . Cancer     right breast cancer    Current Outpatient Prescriptions  Medication Sig Dispense Refill  . ALPRAZolam (XANAX) 0.5 MG tablet TAKE 1 TABLET BY MOUTH 3 TIMES A DAY 90 tablet 1  . atenolol (TENORMIN) 50 MG tablet Take 1 tablet (50 mg total) by mouth 2 (two) times daily. 60 tablet 6  . Naproxen Sodium (ALEVE) 220 MG CAPS Take by mouth as needed.    . pantoprazole (PROTONIX) 40 MG tablet Take 1 tablet (40 mg total) by mouth daily at 12 noon. 30 tablet 1  . sertraline (ZOLOFT) 50 MG tablet Take 1 tablet (50 mg total) by mouth at bedtime. (Patient taking differently: Take 25 mg by mouth at bedtime. ) 90 tablet 3   No current facility-administered medications for this visit.    Allergies:    Allergies  Allergen Reactions  . Codeine Itching and Nausea Only    Pt has had codeine with pre-administration to prevent reaction without any problem.     Social History:  The patient  reports that she has been smoking Cigarettes.  She has a 45 pack-year smoking history. She has never used smokeless tobacco. She reports that she drinks alcohol. She reports that she does not use illicit drugs.   Family history:   Family History  Problem Relation Age of Onset  .  Colon cancer Neg Hx   . Cancer Father 44    lung  . Dementia Father   . Dementia Mother   . Hypertension Mother   . Hyperlipidemia Mother   . Breast cancer Maternal Aunt   . Cancer Paternal Uncle   . Breast cancer Maternal Aunt   . Breast cancer Maternal Aunt     ROS:  Please see the history of present illness.  All other systems reviewed and negative.   PHYSICAL EXAM: VS:  BP 104/70 mmHg  Pulse 70  Ht 5\' 4"  (1.626 m)  Wt 134 lb (60.782 kg)  BMI 22.99 kg/m2 Well nourished, well developed, in no acute distress HEENT: Pupils are equal round react to light accommodation  extraocular movements are intact.  Neck: no JVDNo cervical lymphadenopathy. Cardiac: Regular rate and rhythm without murmurs rubs or gallops. Lungs:  clear to auscultation bilaterally, no wheezing, rhonchi or rales Abd: soft, nontender, positive bowel sounds all quadrants, no hepatosplenomegaly Ext: no lower extremity edema.  2+ radial and dorsalis pedis pulses. Skin: warm and dry Neuro:  Grossly normal     ASSESSMENT AND PLAN:  Problem List Items Addressed This Visit    TOBACCO ABUSE - Primary    Tobacco cessation discussed      PSVT    She has rare palpitations that last only 1-2 seconds. We refilled her atenolol today. She can follow-up in 6 months with Dr. Stanford Crawford      Relevant Medications   atenolol (TENORMIN) 50 MG tablet

## 2014-11-22 NOTE — Patient Instructions (Signed)
Your physician wants you to follow-up in: 6 Months with Dr Stanford Breed. You will receive a reminder letter in the mail two months in advance. If you don't receive a letter, please call our office to schedule the follow-up appointment.

## 2014-11-22 NOTE — Assessment & Plan Note (Signed)
Tobacco cessation discussed 

## 2014-11-22 NOTE — Assessment & Plan Note (Signed)
She has rare palpitations that last only 1-2 seconds. We refilled her atenolol today. She can follow-up in 6 months with Dr. Stanford Breed

## 2014-12-07 NOTE — Assessment & Plan Note (Signed)
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 Status post neoadjuvant chemotherapy with TCHPx2, GCHPx1, CHPx1, HPx2 , complete pathologic response with only residual DCIS 7 cm remaining. Anastrozole started 08/19/2014 stopped 09/18/2014  Recommendation: 1. Continue maintenance Herceptin which was on hold (due to scheduling error) resumed 10/27/2014 2. Antiestrogen therapy with anastrozole 1 mg daily started 08/19/2014 (holding for side effects), Plan to resume it Sept 2016 after herceptin is complete  Herceptin toxicities: 1. Diarrhea 2. Nausea 3. Decreased sleep and increased fatigue  Neuropathy: Slowly improving with time Hypokalemia: Improved Depression: Currently on Zoloft. Arthritis: Patient finally came off Percocets.  RTC in 6 weeks and Q 3 weeks for Herceptin

## 2014-12-08 ENCOUNTER — Ambulatory Visit: Payer: Medicaid Other

## 2014-12-08 ENCOUNTER — Other Ambulatory Visit: Payer: Medicaid Other

## 2014-12-08 ENCOUNTER — Ambulatory Visit: Payer: Medicaid Other | Admitting: Hematology and Oncology

## 2014-12-12 ENCOUNTER — Telehealth: Payer: Self-pay | Admitting: Hematology and Oncology

## 2014-12-12 ENCOUNTER — Telehealth: Payer: Self-pay

## 2014-12-12 NOTE — Telephone Encounter (Signed)
Appointments made per pof and a letter and calendar have been mailed to the patient

## 2014-12-12 NOTE — Telephone Encounter (Signed)
-----   Message from Barbera Setters sent at 12/07/2014  3:38 PM EDT ----- Patient called and left voicemail stating she does not want to keep any of her appointments. She is currently working and can not handle doing both working and treatment. She does not want to reschedule.  Thank you

## 2014-12-29 ENCOUNTER — Ambulatory Visit: Payer: Medicaid Other

## 2014-12-29 ENCOUNTER — Other Ambulatory Visit: Payer: Medicaid Other

## 2015-01-11 ENCOUNTER — Other Ambulatory Visit: Payer: Self-pay | Admitting: Family Medicine

## 2015-01-13 NOTE — Telephone Encounter (Signed)
Faxed

## 2015-01-19 ENCOUNTER — Ambulatory Visit: Payer: Medicaid Other

## 2015-01-19 ENCOUNTER — Other Ambulatory Visit: Payer: Medicaid Other

## 2015-02-09 ENCOUNTER — Ambulatory Visit: Payer: Medicaid Other

## 2015-02-09 ENCOUNTER — Other Ambulatory Visit: Payer: Medicaid Other

## 2015-02-15 ENCOUNTER — Encounter (HOSPITAL_COMMUNITY): Payer: Self-pay | Admitting: Emergency Medicine

## 2015-02-15 ENCOUNTER — Emergency Department (HOSPITAL_COMMUNITY)
Admission: EM | Admit: 2015-02-15 | Discharge: 2015-02-15 | Discharge status: Against Medical Advice | Payer: Medicaid Other | Attending: Emergency Medicine | Admitting: Emergency Medicine

## 2015-02-15 DIAGNOSIS — Z720 Tobacco use: Secondary | ICD-10-CM | POA: Insufficient documentation

## 2015-02-15 DIAGNOSIS — M549 Dorsalgia, unspecified: Secondary | ICD-10-CM | POA: Diagnosis present

## 2015-02-15 NOTE — ED Notes (Signed)
Per pt, states she has been havng back pain just below left shoulder blade

## 2015-03-13 ENCOUNTER — Other Ambulatory Visit: Payer: Self-pay | Admitting: Family Medicine

## 2015-03-13 NOTE — Telephone Encounter (Signed)
It looks like pt hasn't been seen since 03/2013.

## 2015-03-14 NOTE — Telephone Encounter (Signed)
Faxed

## 2015-03-15 NOTE — Assessment & Plan Note (Signed)
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 Status post neoadjuvant chemotherapy with TCHPx2, GCHPx1, CHPx1, HPx2 , complete pathologic response with only residual DCIS 7 cm remaining. Anastrozole started 08/19/2014 stopped 09/18/2014  Recommendation: 1. Continue maintenance Herceptin was also on hold resuming 10/27/2014 2. Antiestrogen therapy with anastrozole 1 mg daily started 08/19/2014 (holding for side effects)  Herceptin toxicities: 1. Diarrhea 2. Nausea 3. Decreased sleep and increased fatigue  Neuropathy: Slowly improving with time Hypokalemia: Today potassium is 4.7. Patient reports that if she does not take potassium she feels weak Depression: Currently on Zoloft. Arthritis: Patient finally came off Percocets.  Anastrozole toxicities: Severe profound fatigue, diffuse body aches,occasional hot flashes and diarrhea. We discontinued anastrozole and gave her 3 month break from antiestrogen therapy. She will resume tamoxifen probably in September 2016 after she completes with Herceptin.

## 2015-03-16 ENCOUNTER — Ambulatory Visit (HOSPITAL_BASED_OUTPATIENT_CLINIC_OR_DEPARTMENT_OTHER): Payer: Medicaid Other

## 2015-03-16 ENCOUNTER — Ambulatory Visit (HOSPITAL_BASED_OUTPATIENT_CLINIC_OR_DEPARTMENT_OTHER): Payer: Medicaid Other | Admitting: Hematology and Oncology

## 2015-03-16 ENCOUNTER — Encounter: Payer: Self-pay | Admitting: Hematology and Oncology

## 2015-03-16 VITALS — BP 132/82 | HR 79 | Temp 98.5°F | Resp 18 | Ht 64.0 in | Wt 142.9 lb

## 2015-03-16 DIAGNOSIS — M549 Dorsalgia, unspecified: Secondary | ICD-10-CM

## 2015-03-16 DIAGNOSIS — C50411 Malignant neoplasm of upper-outer quadrant of right female breast: Secondary | ICD-10-CM

## 2015-03-16 DIAGNOSIS — Z170 Estrogen receptor positive status [ER+]: Secondary | ICD-10-CM | POA: Diagnosis not present

## 2015-03-16 DIAGNOSIS — G629 Polyneuropathy, unspecified: Secondary | ICD-10-CM

## 2015-03-16 DIAGNOSIS — E876 Hypokalemia: Secondary | ICD-10-CM

## 2015-03-16 DIAGNOSIS — C50811 Malignant neoplasm of overlapping sites of right female breast: Secondary | ICD-10-CM | POA: Diagnosis not present

## 2015-03-16 DIAGNOSIS — R5383 Other fatigue: Secondary | ICD-10-CM | POA: Diagnosis not present

## 2015-03-16 DIAGNOSIS — F329 Major depressive disorder, single episode, unspecified: Secondary | ICD-10-CM

## 2015-03-16 DIAGNOSIS — R110 Nausea: Secondary | ICD-10-CM

## 2015-03-16 LAB — COMPREHENSIVE METABOLIC PANEL (CC13)
ALK PHOS: 135 U/L (ref 40–150)
ALT: 11 U/L (ref 0–55)
AST: 17 U/L (ref 5–34)
Albumin: 4.1 g/dL (ref 3.5–5.0)
Anion Gap: 8 mEq/L (ref 3–11)
BUN: 10.3 mg/dL (ref 7.0–26.0)
CO2: 25 meq/L (ref 22–29)
CREATININE: 0.8 mg/dL (ref 0.6–1.1)
Calcium: 9.6 mg/dL (ref 8.4–10.4)
Chloride: 101 mEq/L (ref 98–109)
EGFR: 83 mL/min/{1.73_m2} — AB (ref >90)
Glucose: 87 mg/dl (ref 70–140)
Potassium: 3.9 mEq/L (ref 3.5–5.1)
SODIUM: 134 meq/L — AB (ref 136–145)
Total Bilirubin: 0.77 mg/dL (ref 0.20–1.20)
Total Protein: 7.3 g/dL (ref 6.4–8.3)

## 2015-03-16 LAB — CBC WITH DIFFERENTIAL/PLATELET
BASO%: 0.1 % (ref 0.0–2.0)
BASOS ABS: 0 10*3/uL (ref 0.0–0.1)
EOS%: 2 % (ref 0.0–7.0)
Eosinophils Absolute: 0.2 10*3/uL (ref 0.0–0.5)
HEMATOCRIT: 40.3 % (ref 34.8–46.6)
HGB: 13.7 g/dL (ref 11.6–15.9)
LYMPH#: 4.1 10*3/uL — AB (ref 0.9–3.3)
LYMPH%: 45.1 % (ref 14.0–49.7)
MCH: 29.1 pg (ref 25.1–34.0)
MCHC: 34 g/dL (ref 31.5–36.0)
MCV: 85.7 fL (ref 79.5–101.0)
MONO#: 0.6 10*3/uL (ref 0.1–0.9)
MONO%: 6.4 % (ref 0.0–14.0)
NEUT#: 4.3 10*3/uL (ref 1.5–6.5)
NEUT%: 46.4 % (ref 38.4–76.8)
PLATELETS: 230 10*3/uL (ref 145–400)
RBC: 4.7 10*6/uL (ref 3.70–5.45)
RDW: 16.3 % — ABNORMAL HIGH (ref 11.2–14.5)
WBC: 9.2 10*3/uL (ref 3.9–10.3)

## 2015-03-16 MED ORDER — TRAMADOL HCL 50 MG PO TABS: 50.0000 mg | ORAL_TABLET | Freq: Two times a day (BID) | ORAL | Status: DC

## 2015-03-16 NOTE — Progress Notes (Signed)
Patient Care Team: Darreld Mclean, MD as PCP - General (Family Medicine) Nicholas Lose, MD as Consulting Physician (Hematology and Oncology) [name], MD as Consulting Physician (General Surgery) Thea Silversmith, MD as Consulting Physician (Radiation Oncology)  DIAGNOSIS: Breast cancer of upper-outer quadrant of right female breast [facility])   Staging form: Breast, AJCC 7th Edition     Clinical: Stage IIA (T2, N0, cM0) - Unsigned       Staging comments: Staged at breast conference 12/29/13.      Pathologic stage from 08/01/2014: Stage 0 (yTis (DCIS), N0, cM0) - Signed by Enid Cutter, MD on 08/07/2014       Staging comments: Staged on final mastectomy specimen by Dr. Donato Heinz    SUMMARY OF ONCOLOGIC HISTORY:   Breast cancer of upper-outer quadrant of right female breast ([place])   12/16/2013 Mammogram 1. A palpable 4.1 cm diameter mass/asymmetry with associated coarse heterogeneous microcalcifications at 11 o'clock 2 cm from the right nipple demonstrates mammographic and sonographic features highly suggestive of malignancy.    12/16/2013 Initial Biopsy Breast cancer of upper-outer quadrant of right female breast; Invasive Ductal Cancer Er 100%, PR: 0%; Her2 Positive Ratio 4.28: Ki 67:24%;    12/23/2013 Breast MRI Right Breast 4.4 x 3 x 2.5 cm and a sub cm satellite lesion. No LN   01/28/2014 - 05/27/2014 Neo-Adjuvant Chemotherapy TC HP x2 cycles ( developed hand-foot syndrome) GC HP x1 cycle (developed dehydration and renal failure) CHP from 04/08/2014   07/21/2014 -  Chemotherapy Herceptin maintenance therapy   07/28/2014 Surgery Right breast mastectomy: DCIS 7 cm, 4 lymph nodes negative, no invasive breast cancer was identified, complete pathologic response   08/19/2014 -  Anti-estrogen oral therapy Anastrozole 1 mg daily stopped 09/23/2014 due to diffuse body aches, diarrhea, nausea, fatigue we will switch her to tamoxifen in August 2016    CHIEF COMPLIANT: complains of upper back pain below the  shoulder blades, nausea, taste changes, fatigue  INTERVAL HISTORY: [name] is a 59 year old with above-mentioned history of right breast cancer treated with neoadjuvant chemotherapy followed by Herceptin maintenance and anastrozole. She stopped both Herceptin and anastrozole because of diffuse body aches and pains as well as diarrhea and nausea and fatigue. We were supposed to resume Herceptin but it was never restarted back. She started working part-time at [employer] and is very proud of her work that she is doing there. For the past 3 months she has noted increasing pain in the upper to mid back. She'll also notice increased nausea issues as well as taste changes. Everything she eats taste like sugar.she feels that there is something going on in her body and possibly that it is relapsed breast cancer. She also complains of shortness of breath. She continues to smoke cigarettes. She says she is taking about 12 Aleve every day.  REVIEW OF SYSTEMS:   Constitutional: Denies fevers, chills or abnormal weight loss Eyes: Denies blurriness of vision Ears, nose, mouth, throat, and face: Denies mucositis or sore throat Respiratory: shortness of breath and cough Cardiovascular: Denies palpitation, chest discomfort or lower extremity swelling Gastrointestinal:  Denies nausea, heartburn or change in bowel habits Skin: Denies abnormal skin rashes Lymphatics: Denies new lymphadenopathy or easy bruising Neurological:Denies numbness, tingling or new weaknesses Behavioral/Psych: Mood is stable, no new changes  All other systems were reviewed with the patient and are negative.  I have reviewed the past medical history, past surgical history, social history and family history with the patient and they are  unchanged from previous note.  ALLERGIES:  is allergic to codeine.  MEDICATIONS:  Current Outpatient Prescriptions  Medication Sig Dispense Refill  . ALPRAZolam (XANAX) 0.5 MG tablet TAKE 1 TABLET 3  TIMES A DAY 90 tablet 1  . atenolol (TENORMIN) 50 MG tablet Take 1 tablet (50 mg total) by mouth 2 (two) times daily. 60 tablet 6  . Naproxen Sodium (ALEVE) 220 MG CAPS Take by mouth as needed.    . pantoprazole (PROTONIX) 40 MG tablet Take 1 tablet (40 mg total) by mouth daily at 12 noon. 30 tablet 1  . sertraline (ZOLOFT) 50 MG tablet Take 1 tablet (50 mg total) by mouth at bedtime. (Patient taking differently: Take 25 mg by mouth at bedtime. ) 90 tablet 3   No current facility-administered medications for this visit.    PHYSICAL EXAMINATION: ECOG PERFORMANCE STATUS: 1 - Symptomatic but completely ambulatory  Filed Vitals:   03/16/15 1454  BP: 132/82  Pulse: 79  Temp: 98.5 F (36.9 C)  Resp: 18   Filed Weights   03/16/15 1454  Weight: 142 lb 14.4 oz (64.819 kg)    GENERAL:alert, no distress and comfortable SKIN: skin color, texture, turgor are normal, no rashes or significant lesions EYES: normal, Conjunctiva are pink and non-injected, sclera clear OROPHARYNX:no exudate, no erythema and lips, buccal mucosa, and tongue normal  NECK: supple, thyroid normal size, non-tender, without nodularity LYMPH:  no palpable lymphadenopathy in the cervical, axillary or inguinal LUNGS: clear to auscultation and percussion with normal breathing effort HEART: regular rate & rhythm and no murmurs and no lower extremity edema ABDOMEN:abdomen soft, non-tender and normal bowel sounds Musculoskeletal:no cyanosis of digits and no clubbing  NEURO: alert & oriented x 3 with fluent speech, no focal motor/sensory deficits  LABORATORY DATA:  I have reviewed the data as listed   Chemistry      Component Value Date/Time   NA 128* 10/20/2014 0914   NA 131* 09/01/2014 0914   K 4.7 10/20/2014 0914   K 3.9 09/01/2014 0914   CL 101 09/01/2014 0914   CO2 22 10/20/2014 0914   CO2 21 09/01/2014 0914   BUN 7.2 10/20/2014 0914   BUN 8 09/01/2014 0914   CREATININE 0.8 10/20/2014 0914   CREATININE 0.85  09/01/2014 0914   CREATININE 0.76 04/03/2013 1420      Component Value Date/Time   CALCIUM 9.0 10/20/2014 0914   CALCIUM 8.6 09/01/2014 0914   ALKPHOS 181* 10/20/2014 0914   ALKPHOS 131* 09/01/2014 0914   AST 16 10/20/2014 0914   AST 18 09/01/2014 0914   ALT 11 10/20/2014 0914   ALT 14 09/01/2014 0914   BILITOT 0.51 10/20/2014 0914   BILITOT 0.5 09/01/2014 0914       Lab Results  Component Value Date   WBC 10.1 10/20/2014   HGB 12.1 10/20/2014   HCT 35.0 10/20/2014   MCV 83.8 10/20/2014   PLT 297 10/20/2014   NEUTROABS 7.0* 10/20/2014    ASSESSMENT & PLAN:  Breast cancer of 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 Status post neoadjuvant chemotherapy with TCHPx2, GCHPx1, CHPx1, HPx2 , complete pathologic response with only residual DCIS 7 cm remaining. Anastrozole started 08/19/2014 stopped 09/18/2014, Herceptin stopped April 2016  Neuropathy: Slowly improving with time  Hypokalemia: off potassium Depression: off Zoloft. She would like to resume Zoloft but I discussed with her about interaction between tramadol and Zoloft so she will not take Zoloft at this time  Severe back pain: I prescribed her Ultram today. I would like to obtain CT chest abdomen pelvis and bone scan to assess for evidence of disease relapse or recurrence. Nausea, fatigue, taste changes: Unclear etiology We will get CBC CMP done today.  No orders of the defined types were placed in this encounter.   The patient has a good understanding of the overall plan. she agrees with it. she will call with any problems that may develop before the next visit here.   Rulon Eisenmenger, MD 03/16/2015

## 2015-03-17 ENCOUNTER — Telehealth: Payer: Self-pay | Admitting: *Deleted

## 2015-03-17 NOTE — Telephone Encounter (Signed)
Patient called stating that she will not be taking Tramadol anymore. Patient states that after a dose of Tramadol, she began to experience dizziness/lightheadedness/and nauseous. Patient states that the symptoms are resolved. Messages sent to MD Hanley Seamen.

## 2015-03-20 ENCOUNTER — Encounter (HOSPITAL_COMMUNITY)
Admission: RE | Admit: 2015-03-20 | Discharge: 2015-03-20 | Disposition: A | Discharge status: Home / Self Care | Payer: Medicaid Other | Source: Ambulatory Visit | Attending: Hematology and Oncology | Admitting: Hematology and Oncology

## 2015-03-20 ENCOUNTER — Encounter (HOSPITAL_COMMUNITY): Payer: Self-pay

## 2015-03-20 ENCOUNTER — Ambulatory Visit (HOSPITAL_COMMUNITY)
Admission: RE | Admit: 2015-03-20 | Discharge: 2015-03-20 | Disposition: A | Discharge status: Home / Self Care | Payer: Medicaid Other | Source: Ambulatory Visit | Attending: Hematology and Oncology | Admitting: Hematology and Oncology

## 2015-03-20 DIAGNOSIS — C50411 Malignant neoplasm of upper-outer quadrant of right female breast: Secondary | ICD-10-CM | POA: Insufficient documentation

## 2015-03-20 DIAGNOSIS — J439 Emphysema, unspecified: Secondary | ICD-10-CM | POA: Diagnosis not present

## 2015-03-20 DIAGNOSIS — Z9221 Personal history of antineoplastic chemotherapy: Secondary | ICD-10-CM | POA: Diagnosis not present

## 2015-03-20 DIAGNOSIS — I700 Atherosclerosis of aorta: Secondary | ICD-10-CM | POA: Diagnosis not present

## 2015-03-20 DIAGNOSIS — M549 Dorsalgia, unspecified: Secondary | ICD-10-CM | POA: Insufficient documentation

## 2015-03-20 DIAGNOSIS — C50911 Malignant neoplasm of unspecified site of right female breast: Secondary | ICD-10-CM | POA: Diagnosis not present

## 2015-03-20 DIAGNOSIS — M069 Rheumatoid arthritis, unspecified: Secondary | ICD-10-CM | POA: Diagnosis not present

## 2015-03-20 DIAGNOSIS — M1990 Unspecified osteoarthritis, unspecified site: Secondary | ICD-10-CM | POA: Diagnosis not present

## 2015-03-20 DIAGNOSIS — Z9011 Acquired absence of right breast and nipple: Secondary | ICD-10-CM | POA: Diagnosis not present

## 2015-03-20 DIAGNOSIS — R918 Other nonspecific abnormal finding of lung field: Secondary | ICD-10-CM | POA: Diagnosis not present

## 2015-03-20 MED ORDER — IOHEXOL 300 MG/ML  SOLN
100.0000 mL | Freq: Once | INTRAMUSCULAR | Status: AC | PRN
  Administered 2015-03-20: 100 mL via INTRAVENOUS

## 2015-03-20 MED ORDER — TECHNETIUM TC 99M MEDRONATE IV KIT
25.6000 | PACK | Freq: Once | INTRAVENOUS | Status: AC | PRN
  Administered 2015-03-20: 25.6 via INTRAVENOUS

## 2015-03-21 NOTE — Assessment & Plan Note (Signed)
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 Status post neoadjuvant chemotherapy with TCHPx2, GCHPx1, CHPx1, HPx2 , complete pathologic response with only residual DCIS 7 cm remaining. Anastrozole started 08/19/2014 stopped 09/18/2014, Herceptin stopped April 2016  Neuropathy: Slowly improving with time Hypokalemia: off potassium Depression: off Zoloft. She would like to resume Zoloft   Severe Back Pain: Cts and Bone Scan 03/20/15: Normal Refer to ortho for her back issues

## 2015-03-22 ENCOUNTER — Ambulatory Visit (HOSPITAL_BASED_OUTPATIENT_CLINIC_OR_DEPARTMENT_OTHER): Payer: Medicaid Other | Admitting: Hematology and Oncology

## 2015-03-22 ENCOUNTER — Encounter: Payer: Self-pay | Admitting: Hematology and Oncology

## 2015-03-22 ENCOUNTER — Telehealth: Payer: Self-pay | Admitting: Hematology and Oncology

## 2015-03-22 VITALS — BP 119/72 | HR 65 | Temp 98.1°F | Resp 18 | Ht 64.0 in | Wt 138.9 lb

## 2015-03-22 DIAGNOSIS — M1990 Unspecified osteoarthritis, unspecified site: Secondary | ICD-10-CM | POA: Insufficient documentation

## 2015-03-22 DIAGNOSIS — G629 Polyneuropathy, unspecified: Secondary | ICD-10-CM | POA: Diagnosis not present

## 2015-03-22 DIAGNOSIS — M19021 Primary osteoarthritis, right elbow: Secondary | ICD-10-CM

## 2015-03-22 DIAGNOSIS — C50411 Malignant neoplasm of upper-outer quadrant of right female breast: Secondary | ICD-10-CM

## 2015-03-22 DIAGNOSIS — M549 Dorsalgia, unspecified: Secondary | ICD-10-CM | POA: Diagnosis not present

## 2015-03-22 DIAGNOSIS — E876 Hypokalemia: Secondary | ICD-10-CM | POA: Diagnosis not present

## 2015-03-22 DIAGNOSIS — M0579 Rheumatoid arthritis with rheumatoid factor of multiple sites without organ or systems involvement: Secondary | ICD-10-CM

## 2015-03-22 DIAGNOSIS — M19022 Primary osteoarthritis, left elbow: Secondary | ICD-10-CM

## 2015-03-22 DIAGNOSIS — F329 Major depressive disorder, single episode, unspecified: Secondary | ICD-10-CM | POA: Diagnosis not present

## 2015-03-22 MED ORDER — TAMOXIFEN CITRATE 20 MG PO TABS: 20.0000 mg | ORAL_TABLET | Freq: Every day | ORAL | Status: DC

## 2015-03-22 MED ORDER — OXYCODONE-ACETAMINOPHEN 5-325 MG PO TABS: 1.0000 | ORAL_TABLET | Freq: Three times a day (TID) | ORAL | Status: DC | PRN

## 2015-03-22 MED ORDER — VENLAFAXINE HCL ER 37.5 MG PO CP24: 37.5000 mg | ORAL_CAPSULE | Freq: Every day | ORAL | Status: DC

## 2015-03-22 NOTE — Progress Notes (Signed)
Dr. Tonette Bihari office contacted -- Dr. Ouida Sills no longer at facility - pt will be seeing Dr. Juluis Pitch.    Referral sent as it has been more than 4 years since she was last seen.

## 2015-03-22 NOTE — Addendum Note (Signed)
Addended by: Prentiss Bells on: 03/22/2015 03:29 PM   Modules accepted: Medications

## 2015-03-22 NOTE — Telephone Encounter (Signed)
Appointments made and avs pritned for patient °

## 2015-03-22 NOTE — Progress Notes (Signed)
Patient Care Team: Darreld Mclean, MD as PCP - General (Family Medicine) Nicholas Lose, MD as Consulting Physician (Hematology and Oncology) [name], MD as Consulting Physician (General Surgery) Thea Silversmith, MD as Consulting Physician (Radiation Oncology)  DIAGNOSIS: Breast cancer of upper-outer quadrant of right female breast [facility])   Staging form: Breast, AJCC 7th Edition     Clinical: Stage IIA (T2, N0, cM0) - Unsigned       Staging comments: Staged at breast conference 12/29/13.      Pathologic stage from 08/01/2014: Stage 0 (yTis (DCIS), N0, cM0) - Signed by Enid Cutter, MD on 08/07/2014       Staging comments: Staged on final mastectomy specimen by Dr. Donato Heinz    SUMMARY OF ONCOLOGIC HISTORY:   Breast cancer of upper-outer quadrant of right female breast ([place])   12/16/2013 Mammogram 1. A palpable 4.1 cm diameter mass/asymmetry with associated coarse heterogeneous microcalcifications at 11 o'clock 2 cm from the right nipple demonstrates mammographic and sonographic features highly suggestive of malignancy.    12/16/2013 Initial Biopsy Breast cancer of upper-outer quadrant of right female breast; Invasive Ductal Cancer Er 100%, PR: 0%; Her2 Positive Ratio 4.28: Ki 67:24%;    12/23/2013 Breast MRI Right Breast 4.4 x 3 x 2.5 cm and a sub cm satellite lesion. No LN   01/28/2014 - 05/27/2014 Neo-Adjuvant Chemotherapy TC HP x2 cycles ( developed hand-foot syndrome) GC HP x1 cycle (developed dehydration and renal failure) CHP from 04/08/2014   07/21/2014 -  Chemotherapy Herceptin maintenance therapy   07/28/2014 Surgery Right breast mastectomy: DCIS 7 cm, 4 lymph nodes negative, no invasive breast cancer was identified, complete pathologic response   08/19/2014 -  Anti-estrogen oral therapy Anastrozole 1 mg daily stopped 09/23/2014 due to diffuse body aches, diarrhea, nausea, fatigue we will switch her to tamoxifen in August 2016    CHIEF COMPLIANT: Follow-up to review scans  INTERVAL  HISTORY: [name] is a 59 year old with above-mentioned history of right breast cancer treated with neoadjuvant chemotherapy followed by mastectomy and took anastrozole but could not tolerate it. She came in complaining of intense low back pain as well as arthritis in multiple joints. We obtained CT scans and bone scans all of which did not show any evidence of metastatic disease. She is here today to discuss the results of the scans. Prescribed tramadol but 1 tablet of tramadol made her extremely dizzy and she could not function and so she discontinued it. She reports that previously oxycodone was only medicine that she could tolerate for pain.  REVIEW OF SYSTEMS:   Constitutional: Denies fevers, chills or abnormal weight loss Eyes: Denies blurriness of vision Ears, nose, mouth, throat, and face: Denies mucositis or sore throat Respiratory: Denies cough, dyspnea or wheezes Cardiovascular: Denies palpitation, chest discomfort or lower extremity swelling Gastrointestinal:  Denies nausea, heartburn or change in bowel habits Skin: Denies abnormal skin rashes Lymphatics: Denies new lymphadenopathy or easy bruising Neurological: Low back pain as well as pain in the elbows shoulders Behavioral/Psych: Mood is stable, no new changes  Breast:  denies any pain or lumps or nodules in either breasts All other systems were reviewed with the patient and are negative.  I have reviewed the past medical history, past surgical history, social history and family history with the patient and they are unchanged from previous note.  ALLERGIES:  is allergic to codeine.  MEDICATIONS:  Current Outpatient Prescriptions  Medication Sig Dispense Refill  . ALPRAZolam (XANAX) 0.5 MG tablet TAKE 1  TABLET 3 TIMES A DAY 90 tablet 1  . atenolol (TENORMIN) 50 MG tablet Take 1 tablet (50 mg total) by mouth 2 (two) times daily. 60 tablet 6  . Naproxen Sodium (ALEVE) 220 MG CAPS Take by mouth as needed.    Marland Kitchen  oxyCODONE-acetaminophen (PERCOCET/ROXICET) 5-325 MG tablet Take 1 tablet by mouth every 8 (eight) hours as needed for severe pain. 40 tablet 0  . pantoprazole (PROTONIX) 40 MG tablet Take 1 tablet (40 mg total) by mouth daily at 12 noon. 30 tablet 1  . tamoxifen (NOLVADEX) 20 MG tablet Take 1 tablet (20 mg total) by mouth daily. 90 tablet 3  . venlafaxine XR (EFFEXOR-XR) 37.5 MG 24 hr capsule Take 1 capsule (37.5 mg total) by mouth daily with breakfast. 30 capsule 12   No current facility-administered medications for this visit.    PHYSICAL EXAMINATION: ECOG PERFORMANCE STATUS: 1 - Symptomatic but completely ambulatory  Filed Vitals:   03/22/15 1102  BP: 119/72  Pulse: 65  Temp: 98.1 F (36.7 C)  Resp: 18   Filed Weights   03/22/15 1102  Weight: 138 lb 14.4 oz (63.005 kg)    GENERAL:alert, no distress and comfortable SKIN: skin color, texture, turgor are normal, no rashes or significant lesions EYES: normal, Conjunctiva are pink and non-injected, sclera clear OROPHARYNX:no exudate, no erythema and lips, buccal mucosa, and tongue normal  NECK: supple, thyroid normal size, non-tender, without nodularity LYMPH:  no palpable lymphadenopathy in the cervical, axillary or inguinal LUNGS: clear to auscultation and percussion with normal breathing effort HEART: regular rate & rhythm and no murmurs and no lower extremity edema ABDOMEN:abdomen soft, non-tender and normal bowel sounds Musculoskeletal:no cyanosis of digits and no clubbing  NEURO: alert & oriented x 3 with fluent speech, no focal motor/sensory deficits   LABORATORY DATA:  I have reviewed the data as listed   Chemistry      Component Value Date/Time   NA 134* 03/16/2015 1553   NA 131* 09/01/2014 0914   K 3.9 03/16/2015 1553   K 3.9 09/01/2014 0914   CL 101 09/01/2014 0914   CO2 25 03/16/2015 1553   CO2 21 09/01/2014 0914   BUN 10.3 03/16/2015 1553   BUN 8 09/01/2014 0914   CREATININE 0.8 03/16/2015 1553    CREATININE 0.85 09/01/2014 0914   CREATININE 0.76 04/03/2013 1420      Component Value Date/Time   CALCIUM 9.6 03/16/2015 1553   CALCIUM 8.6 09/01/2014 0914   ALKPHOS 135 03/16/2015 1553   ALKPHOS 131* 09/01/2014 0914   AST 17 03/16/2015 1553   AST 18 09/01/2014 0914   ALT 11 03/16/2015 1553   ALT 14 09/01/2014 0914   BILITOT 0.77 03/16/2015 1553   BILITOT 0.5 09/01/2014 0914       Lab Results  Component Value Date   WBC 9.2 03/16/2015   HGB 13.7 03/16/2015   HCT 40.3 03/16/2015   MCV 85.7 03/16/2015   PLT 230 03/16/2015   NEUTROABS 4.3 03/16/2015     RADIOGRAPHIC STUDIES: I have personally reviewed the radiology reports and agreed with their findings. Ct Chest W Contrast  03/20/2015  CLINICAL DATA:  Breast cancer of the RIGHT breast. Subsequent treatment evaluation. Patient status post chemotherapy. EXAM: CT CHEST, ABDOMEN, AND PELVIS WITH CONTRAST TECHNIQUE: Multidetector CT imaging of the chest, abdomen and pelvis was performed following the standard protocol during bolus administration of intravenous contrast. CONTRAST:  18mL OMNIPAQUE IOHEXOL 300 MG/ML  SOLN COMPARISON:  PET-CT 01/06/2014 FINDINGS: CT CHEST  FINDINGS Mediastinum/Nodes: Port in the LEFT anterior chest wall. Post RIGHT mastectomy anatomy. Post RIGHT axillary nodal dissection. No enlarged axillary or supraclavicular nodes. No enlarged internal mammary nodes. No mediastinal hilar adenopathy. No pericardial fluid. No central pulmonary embolism normal esophagus Lungs/Pleura: 4 mm flattened nodule along the RIGHT horizontal fissure (image 32, series 2) faintly seen comparison exam. 3 mm nodule the RIGHT upper lobe on image 29, series 2 is not seen on prior which may relate to differing CT technique. Airways normal. Paraseptal emphysema Musculoskeletal: No aggressive osseous lesion. CT ABDOMEN PELVIS FINDINGS Hepatobiliary: No focal hepatic lesion. No biliary duct dilatation. Gallbladder is normal. Common bile duct is  normal. Pancreas: Pancreas is normal. No ductal dilatation. No pancreatic inflammation. Spleen: Normal spleen Adrenals/urinary tract: Adrenal glands and kidneys are normal. The ureters and bladder normal. Stomach/Bowel: Stomach, small bowel, appendix, and cecum are normal. There is thickening of the gastric body which is felt to represent nondistention. The colon and rectosigmoid colon are normal. Vascular/Lymphatic: Abdominal aorta is normal caliber with atherosclerotic calcification. There is no retroperitoneal or periportal lymphadenopathy. No pelvic lymphadenopathy. Reproductive: Post hysterectomy.  Adnexa are normal. Musculoskeletal: No aggressive osseous lesion. Other: No free fluid. IMPRESSION: Chest Impression: 1. No evidence of thoracic metastasis. 2. Post RIGHT mastectomy with axillary dissection. 3. Very small pulmonary nodules within RIGHT lung are likely benign. Abdomen / Pelvis Impression: 1. No evidence of metastasis abdomen or pelvis. 2. Gastric wall thickening likely relates to nondistention . 3. No skeletal metastasis. Electronically Signed   By: Suzy Bouchard M.D.   On: 03/20/2015 14:02   Nm Bone Scan Whole Body  03/20/2015  CLINICAL DATA:  Multifocal pain. Rheumatoid arthritis. History of breast cancer in 2015. EXAM: NUCLEAR MEDICINE WHOLE BODY BONE SCAN TECHNIQUE: Whole body anterior and posterior images were obtained approximately 3 hours after intravenous injection of radiopharmaceutical. RADIOPHARMACEUTICALS:  25.6 mCi Technetium-52mMDP IV COMPARISON:  PET-CT dated 01/06/2014 FINDINGS: Symmetrical increased tracer uptake in both shoulders, both elbows in both wrists. Increased tracer uptake in both ankles and proximal feet, greater on the right. Small amount of increased tracer uptake in the lower lumbar spine on the right. Normal renal and bladder activity. IMPRESSION: Multifocal arthritic changes. No evidence of bony metastatic disease. Electronically Signed   By: SClaudie ReveringM.D.    On: 03/20/2015 15:41   Ct Abdomen Pelvis W Contrast  03/20/2015  CLINICAL DATA:  Breast cancer of the RIGHT breast. Subsequent treatment evaluation. Patient status post chemotherapy. EXAM: CT CHEST, ABDOMEN, AND PELVIS WITH CONTRAST TECHNIQUE: Multidetector CT imaging of the chest, abdomen and pelvis was performed following the standard protocol during bolus administration of intravenous contrast. CONTRAST:  1047mOMNIPAQUE IOHEXOL 300 MG/ML  SOLN COMPARISON:  PET-CT 01/06/2014 FINDINGS: CT CHEST FINDINGS Mediastinum/Nodes: Port in the LEFT anterior chest wall. Post RIGHT mastectomy anatomy. Post RIGHT axillary nodal dissection. No enlarged axillary or supraclavicular nodes. No enlarged internal mammary nodes. No mediastinal hilar adenopathy. No pericardial fluid. No central pulmonary embolism normal esophagus Lungs/Pleura: 4 mm flattened nodule along the RIGHT horizontal fissure (image 32, series 2) faintly seen comparison exam. 3 mm nodule the RIGHT upper lobe on image 29, series 2 is not seen on prior which may relate to differing CT technique. Airways normal. Paraseptal emphysema Musculoskeletal: No aggressive osseous lesion. CT ABDOMEN PELVIS FINDINGS Hepatobiliary: No focal hepatic lesion. No biliary duct dilatation. Gallbladder is normal. Common bile duct is normal. Pancreas: Pancreas is normal. No ductal dilatation. No pancreatic inflammation. Spleen: Normal spleen Adrenals/urinary  tract: Adrenal glands and kidneys are normal. The ureters and bladder normal. Stomach/Bowel: Stomach, small bowel, appendix, and cecum are normal. There is thickening of the gastric body which is felt to represent nondistention. The colon and rectosigmoid colon are normal. Vascular/Lymphatic: Abdominal aorta is normal caliber with atherosclerotic calcification. There is no retroperitoneal or periportal lymphadenopathy. No pelvic lymphadenopathy. Reproductive: Post hysterectomy.  Adnexa are normal. Musculoskeletal: No  aggressive osseous lesion. Other: No free fluid. IMPRESSION: Chest Impression: 1. No evidence of thoracic metastasis. 2. Post RIGHT mastectomy with axillary dissection. 3. Very small pulmonary nodules within RIGHT lung are likely benign. Abdomen / Pelvis Impression: 1. No evidence of metastasis abdomen or pelvis. 2. Gastric wall thickening likely relates to nondistention . 3. No skeletal metastasis. Electronically Signed   By: Suzy Bouchard M.D.   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 Status post neoadjuvant chemotherapy with TCHPx2, GCHPx1, CHPx1, HPx2 , complete pathologic response with only residual DCIS 7 cm remaining. Anastrozole started 08/19/2014 stopped 09/18/2014, Herceptin stopped April 2016  Neuropathy: Slowly improving with time Hypokalemia: off potassium Depression: off Zoloft. 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 Patient will go back to seeing her rheumatologist Dr. Ouida Sills for her pain issues. She would not tolerate tramadol. I prescribed her Percocets. She plans to use them sparingly and will try to wean herself off slowly. I suspect that her current job is responsible for a lot of her back and leg issues. She will try to look for a different job.  I discussed with her about starting tamoxifen. She was willing to start it. I gave her a prescription for tamoxifen. We will discontinue Zoloft and will start her on Effexor XR because of interaction between Zoloft and tamoxifen.  Return to clinic in 6 weeks for follow-up to assess tolerability to tamoxifen No orders of  the defined types were placed in this encounter.   The patient has a good understanding of the overall plan. she agrees with it. she will call with any problems that may develop before the next visit here.   Rulon Eisenmenger, MD 03/22/2015

## 2015-03-28 ENCOUNTER — Telehealth: Payer: Self-pay | Admitting: *Deleted

## 2015-03-28 NOTE — Telephone Encounter (Signed)
Pt called wanting to inform Dr. Lindi Adie re:  Pt has to take more Percocet for pain - up to 5 tablets per day instead of what was prescribed - to help with severe pain in back and Left shoulder.   Pt is not out of medications at present.  Pt also inquired about a referral to a rheumatologist by Dr. Lindi Adie at her last office visit.   Pt had seen Dr. Tobie Lords in the past @ [facility].   Pt had not seen this md in a while. Pt would like to know if Dr. Lindi Adie would make a referral, or should pt call to make appt herself. Pt's  Phone    [phone].

## 2015-03-28 NOTE — Telephone Encounter (Signed)
Please have her call and make the appt with rheumatology. Her pain meds are written to last a entire month. Have her take as prescribed. VG

## 2015-03-29 NOTE — Telephone Encounter (Signed)
Spoke with patient and let her know referral was sent last week for the new rheumatologist.  Pt voiced understanding.

## 2015-03-29 NOTE — Telephone Encounter (Signed)
"  Would you let Dr. Lindi Adie know the rheumatologist I saw in the past has retired and I'm inactive.  I need a referral to see Dr. Sabino Niemann faxed to [phone].   If he has any questions I can be reached at [phone]."

## 2015-03-29 NOTE — Telephone Encounter (Addendum)
Patient called with the following two requests.  1. I will be out of my pain medicine this weekend.  Would like to pick up prescription for Percocet Thursday or Friday.  Please call [phone] when script ready. 2. Thanks for sending referral information to Dr. Dossie Der.  He doesn't take Medicaid unfortunately.  Could you have Dr. Lindi Adie select a rheumatologist that does accept Medicaid.  Informed patient we do not know who accepts Medicaid.  The other problem is the referral would need to come from the provider name listed on the Medicaid card.  [name] says she will call around to find a provider that does accept Medicaid.  Thanked me for my help.  Will notify provider for request for pain medicine.

## 2015-03-30 ENCOUNTER — Other Ambulatory Visit: Payer: Self-pay | Admitting: *Deleted

## 2015-03-30 ENCOUNTER — Telehealth: Payer: Self-pay

## 2015-03-30 ENCOUNTER — Telehealth: Payer: Self-pay | Admitting: *Deleted

## 2015-03-30 DIAGNOSIS — M0579 Rheumatoid arthritis with rheumatoid factor of multiple sites without organ or systems involvement: Secondary | ICD-10-CM

## 2015-03-30 DIAGNOSIS — C50411 Malignant neoplasm of upper-outer quadrant of right female breast: Secondary | ICD-10-CM

## 2015-03-30 MED ORDER — OXYCODONE-ACETAMINOPHEN 5-325 MG PO TABS: 1.0000 | ORAL_TABLET | Freq: Three times a day (TID) | ORAL | Status: DC | PRN

## 2015-03-30 NOTE — Telephone Encounter (Signed)
Faxed referral to [facility]

## 2015-03-30 NOTE — Telephone Encounter (Signed)
1 - Referral made to rheumatologist will not work - they do not take medicaid.  Pt would like referral sent to [facility] fax# [phone] for this problem. 2 - Pt would like refill on her oxycodone.  Pt acknowledges the refill is early - reports she has discussed with Dr. Lindi Adie need for addnl meds due to pain.  Advised pt Dr Lindi Adie would need to approve this and that desk nurse would call her today with status of Rx.  Routed to pod [facility] for further action.

## 2015-04-06 ENCOUNTER — Telehealth: Payer: Self-pay | Admitting: *Deleted

## 2015-04-06 NOTE — Telephone Encounter (Signed)
Pt called and left message re: pt has not heard from the referral to [facility] yet.  Spoke with pt and was informed that pt was told by the orthopedic's office that no referral had been received yet.   Refaxed referral to [facility]  ;    Phone      [phone].

## 2015-04-11 ENCOUNTER — Telehealth: Payer: Self-pay | Admitting: *Deleted

## 2015-04-11 NOTE — Telephone Encounter (Addendum)
Fax received for [facility] Referrals from [facility].  Note written on fax reads Dr. Tonita Cong, 04-25-2015 at 3:00 pm.  Fax placed in provider Folder and routed.

## 2015-04-11 NOTE — Telephone Encounter (Signed)
Received call from pt stating that [facility] received referral & they need [place] to send bone scan report.  Their fax # is [phone] & ph # is [phone].  She thinks they are in the [facility].  Called & left message to see if they are able to view report but have not heard back.  Will go ahead & fax/route report to Dr Drema Dallas.

## 2015-04-12 ENCOUNTER — Encounter (HOSPITAL_COMMUNITY): Payer: Self-pay

## 2015-04-12 ENCOUNTER — Encounter: Payer: Self-pay | Admitting: *Deleted

## 2015-04-12 ENCOUNTER — Emergency Department (HOSPITAL_COMMUNITY): Payer: Medicaid Other

## 2015-04-12 ENCOUNTER — Emergency Department (HOSPITAL_COMMUNITY)
Admission: EM | Admit: 2015-04-12 | Discharge: 2015-04-12 | Disposition: A | Discharge status: Home / Self Care | Payer: Medicaid Other | Attending: Emergency Medicine | Admitting: Emergency Medicine

## 2015-04-12 DIAGNOSIS — R062 Wheezing: Secondary | ICD-10-CM | POA: Insufficient documentation

## 2015-04-12 DIAGNOSIS — M069 Rheumatoid arthritis, unspecified: Secondary | ICD-10-CM | POA: Insufficient documentation

## 2015-04-12 DIAGNOSIS — Z8742 Personal history of other diseases of the female genital tract: Secondary | ICD-10-CM | POA: Insufficient documentation

## 2015-04-12 DIAGNOSIS — Z79899 Other long term (current) drug therapy: Secondary | ICD-10-CM | POA: Insufficient documentation

## 2015-04-12 DIAGNOSIS — Z853 Personal history of malignant neoplasm of breast: Secondary | ICD-10-CM | POA: Diagnosis not present

## 2015-04-12 DIAGNOSIS — Z8619 Personal history of other infectious and parasitic diseases: Secondary | ICD-10-CM | POA: Diagnosis not present

## 2015-04-12 DIAGNOSIS — Z8679 Personal history of other diseases of the circulatory system: Secondary | ICD-10-CM | POA: Diagnosis not present

## 2015-04-12 DIAGNOSIS — F329 Major depressive disorder, single episode, unspecified: Secondary | ICD-10-CM | POA: Diagnosis not present

## 2015-04-12 DIAGNOSIS — R6889 Other general symptoms and signs: Secondary | ICD-10-CM

## 2015-04-12 DIAGNOSIS — Z8711 Personal history of peptic ulcer disease: Secondary | ICD-10-CM | POA: Insufficient documentation

## 2015-04-12 DIAGNOSIS — F17210 Nicotine dependence, cigarettes, uncomplicated: Secondary | ICD-10-CM | POA: Diagnosis not present

## 2015-04-12 DIAGNOSIS — R112 Nausea with vomiting, unspecified: Secondary | ICD-10-CM | POA: Insufficient documentation

## 2015-04-12 DIAGNOSIS — R197 Diarrhea, unspecified: Secondary | ICD-10-CM | POA: Insufficient documentation

## 2015-04-12 DIAGNOSIS — F419 Anxiety disorder, unspecified: Secondary | ICD-10-CM | POA: Diagnosis not present

## 2015-04-12 LAB — LIPASE, BLOOD: Lipase: 45 U/L (ref 11–51)

## 2015-04-12 LAB — CBC WITH DIFFERENTIAL/PLATELET
Basophils Absolute: 0 10*3/uL (ref 0.0–0.1)
Basophils Relative: 0 %
EOS ABS: 0.2 10*3/uL (ref 0.0–0.7)
Eosinophils Relative: 2 %
HEMATOCRIT: 44.6 % (ref 36.0–46.0)
HEMOGLOBIN: 15 g/dL (ref 12.0–15.0)
LYMPHS ABS: 3.7 10*3/uL (ref 0.7–4.0)
Lymphocytes Relative: 38 %
MCH: 29.5 pg (ref 26.0–34.0)
MCHC: 33.6 g/dL (ref 30.0–36.0)
MCV: 87.6 fL (ref 78.0–100.0)
MONOS PCT: 5 %
Monocytes Absolute: 0.5 10*3/uL (ref 0.1–1.0)
NEUTROS ABS: 5.4 10*3/uL (ref 1.7–7.7)
NEUTROS PCT: 55 %
Platelets: 339 10*3/uL (ref 150–400)
RBC: 5.09 MIL/uL (ref 3.87–5.11)
RDW: 14.3 % (ref 11.5–15.5)
WBC: 9.8 10*3/uL (ref 4.0–10.5)

## 2015-04-12 LAB — COMPREHENSIVE METABOLIC PANEL
ALBUMIN: 4.8 g/dL (ref 3.5–5.0)
ALK PHOS: 125 U/L (ref 38–126)
ALT: 15 U/L (ref 14–54)
AST: 20 U/L (ref 15–41)
Anion gap: 8 (ref 5–15)
BILIRUBIN TOTAL: 0.5 mg/dL (ref 0.3–1.2)
BUN: 8 mg/dL (ref 6–20)
CALCIUM: 9.4 mg/dL (ref 8.9–10.3)
CO2: 24 mmol/L (ref 22–32)
CREATININE: 0.72 mg/dL (ref 0.44–1.00)
Chloride: 104 mmol/L (ref 101–111)
GFR calc non Af Amer: >60 mL/min (ref >60)
GLUCOSE: 99 mg/dL (ref 65–99)
Potassium: 3.6 mmol/L (ref 3.5–5.1)
SODIUM: 136 mmol/L (ref 135–145)
TOTAL PROTEIN: 8.8 g/dL — AB (ref 6.5–8.1)

## 2015-04-12 MED ORDER — SODIUM CHLORIDE 0.9 % IV BOLUS (SEPSIS)
1000.0000 mL | Freq: Once | INTRAVENOUS | Status: AC
  Administered 2015-04-12: 1000 mL via INTRAVENOUS

## 2015-04-12 MED ORDER — ALBUTEROL (5 MG/ML) CONTINUOUS INHALATION SOLN
10.0000 mg/h | INHALATION_SOLUTION | Freq: Once | RESPIRATORY_TRACT | Status: AC
  Administered 2015-04-12: 10 mg/h via RESPIRATORY_TRACT
  Filled 2015-04-12: qty 20

## 2015-04-12 MED ORDER — PROMETHAZINE-DM 6.25-15 MG/5ML PO SYRP: 5.0000 mL | ORAL_SOLUTION | Freq: Four times a day (QID) | ORAL | Status: DC | PRN

## 2015-04-12 MED ORDER — IPRATROPIUM BROMIDE 0.02 % IN SOLN
0.5000 mg | Freq: Once | RESPIRATORY_TRACT | Status: AC
  Administered 2015-04-12: 0.5 mg via RESPIRATORY_TRACT
  Filled 2015-04-12: qty 2.5

## 2015-04-12 MED ORDER — BENZONATATE 100 MG PO CAPS
100.0000 mg | ORAL_CAPSULE | Freq: Once | ORAL | Status: AC
  Administered 2015-04-12: 100 mg via ORAL
  Filled 2015-04-12: qty 1

## 2015-04-12 MED ORDER — BENZONATATE 100 MG PO CAPS: 100.0000 mg | ORAL_CAPSULE | Freq: Three times a day (TID) | ORAL | Status: DC

## 2015-04-12 NOTE — ED Notes (Signed)
Pt. Walked well in the hall to the bathroom and her pulse oximetry was 96%-100% the entire walk.

## 2015-04-12 NOTE — Discharge Instructions (Signed)
Viral Infections °A viral infection can be caused by different types of viruses. Most viral infections are not serious and resolve on their own. However, some infections may cause severe symptoms and may lead to further complications. °SYMPTOMS °Viruses can frequently cause: °· Minor sore throat. °· Aches and pains. °· Headaches. °· Runny nose. °· Different types of rashes. °· Watery eyes. °· Tiredness. °· Cough. °· Loss of appetite. °· Gastrointestinal infections, resulting in nausea, vomiting, and diarrhea. °These symptoms do not respond to antibiotics because the infection is not caused by bacteria. However, you might catch a bacterial infection following the viral infection. This is sometimes called a "superinfection." Symptoms of such a bacterial infection may include: °· Worsening sore throat with pus and difficulty swallowing. °· Swollen neck glands. °· Chills and a high or persistent fever. °· Severe headache. °· Tenderness over the sinuses. °· Persistent overall ill feeling (malaise), muscle aches, and tiredness (fatigue). °· Persistent cough. °· Yellow, green, or brown mucus production with coughing. °HOME CARE INSTRUCTIONS  °· Only take over-the-counter or prescription medicines for pain, discomfort, diarrhea, or fever as directed by your caregiver. °· Drink enough water and fluids to keep your urine clear or pale yellow. Sports drinks can provide valuable electrolytes, sugars, and hydration. °· Get plenty of rest and maintain proper nutrition. Soups and broths with crackers or rice are fine. °SEEK IMMEDIATE MEDICAL CARE IF:  °· You have severe headaches, shortness of breath, chest pain, neck pain, or an unusual rash. °· You have uncontrolled vomiting, diarrhea, or you are unable to keep down fluids. °· You or your child has an oral temperature above 102° F (38.9° C), not controlled by medicine. °· Your baby is older than 3 months with a rectal temperature of 102° F (38.9° C) or higher. °· Your baby is 3  months old or younger with a rectal temperature of 100.4° F (38° C) or higher. °MAKE SURE YOU:  °· Understand these instructions. °· Will watch your condition. °· Will get help right away if you are not doing well or get worse. °  °This information is not intended to replace advice given to you by your health care provider. Make sure you discuss any questions you have with your health care provider. °  °Document Released: 02/13/2005 Document Revised: 07/29/2011 Document Reviewed: 10/12/2014 °Elsevier Interactive Patient Education ©2016 [employer] Inc. ° °

## 2015-04-12 NOTE — ED Notes (Signed)
Patient transported to X-ray 

## 2015-04-12 NOTE — ED Notes (Addendum)
She tells [place] that she has finished chemo. For breast cancer about 6 months ago.  She is here today with c/o "bad cough" plus n/v/d x 2 days.  She is in no distress.

## 2015-04-12 NOTE — ED Provider Notes (Signed)
CSN: [id number]     Arrival date & time 04/12/15  [id number] History   First MD Initiated Contact with Patient 04/12/15 [phone]     Chief Complaint  Patient presents with  . Emesis     (Consider location/radiation/quality/duration/timing/severity/associated sxs/prior Treatment) HPI   59 year old female with history of breast cancer status post chemotherapy, fibromyalgia, hepatitis C, heavy smoker presenting for evaluation of flulike symptoms. Patient report for the past 2 weeks she has had persistent cough initially nonproductive but has become more productive within the past several days. Endorse shortness of breath and wheezing with cough. Symptoms worsening at nighttime. Also endorsed nasal drainage, body aches, and decrease in appetite. For the past 3 days she also endorsed nausea vomiting and diarrhea. States she would have 2-3 bouts of nonbloody nonbilious vomit per day and up to 5 bouts of nonbloody non-mucousy diarrhea per day. No recent antibiotic use. No recent sick contact. Patient smokes a pack a day. She denies prior history of PE or DVT. She is currently under remission of her breast cancer.  Past Medical History  Diagnosis Date  . 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), hypoglycemia 2/04-resolved with PO calcium + D, SVT, was treated with iterferon and ribavirin-10/3-2/04  . PUD (peptic ulcer disease)   . Fibromyalgia   . Rheumatoid arthritis(714.0)     possible  . SVT (supraventricular tachycardia) ([place])   . Depression   . Osteoporosis   . Hepatitis C     Pt was given too much interferon - critical care x2d, 5 days total hosp  . Anxiety   . Wears glasses   . Heavy smoker   . Cancer [facility])     right breast cancer   Past Surgical History  Procedure Laterality Date  . Cesarean section  09/17/80  . Dilation and curettage of uterus    . Hysterectomy-for dysmenorrhea  09/17/00  . Nasal sinus surgery   03/20/84    for deviated septum  . Tonsillectomy  05/20/61  . Abdominal hysterectomy    . Colonoscopy    . Upper gi endoscopy    . Portacath placement Left 01/20/2014    Procedure: INSERTION PORT-A-CATH;  Surgeon: [name], MD;  Location: [facility];  Service: General;  Laterality: Left;  Marland Kitchen Mastectomy w/ sentinel node biopsy Right 07/28/2014    Procedure: RIGHT MASTECTOMY WITH SENTINEL LYMPH NODE BIOPSY ;  Surgeon: [name], MD;  Location: [facility];  Service: General;  Laterality: Right;   Family History  Problem Relation Age of Onset  . Colon cancer Neg Hx   . Cancer Father 71    lung  . Dementia Father   . Dementia Mother   . Hypertension Mother   . Hyperlipidemia Mother   . Breast cancer Maternal Aunt   . Cancer Paternal Uncle   . Breast cancer Maternal Aunt   . Breast cancer Maternal Aunt    Social History  Substance Use Topics  . Smoking status: Current Every Day Smoker -- 1.00 packs/day for 45 years    Types: Cigarettes  . Smokeless tobacco: Never Used     Comment: 2 ppd since 59 yo   . Alcohol Use: Yes     Comment: denies; reports 3-4 beers/night to Dr. Hubbard Robinson with 6 pack on weekend; hx of 12 pack/day EtOH abuse in 93    OB History    Gravida Para Term Preterm AB TAB SAB Ectopic  Multiple Living   1 1 1       1      Review of Systems  All other systems reviewed and are negative.     Allergies  Codeine  Home Medications   Prior to Admission medications   Medication Sig Start Date End Date Taking? Authorizing Provider  ALPRAZolam Duanne Moron) 0.5 MG tablet TAKE 1 TABLET 3 TIMES A DAY 03/13/15   Robyn Haber, MD  atenolol (TENORMIN) 50 MG tablet Take 1 tablet (50 mg total) by mouth 2 (two) times daily. 11/22/14   Brett Canales, PA-C  Naproxen Sodium (ALEVE) 220 MG CAPS Take by mouth as needed.    Historical Provider, MD  oxyCODONE-acetaminophen (PERCOCET/ROXICET) 5-325 MG tablet Take 1 tablet by mouth every 8 (eight) hours as  needed for severe pain. 03/30/15   Nicholas Lose, MD  pantoprazole (PROTONIX) 40 MG tablet Take 1 tablet (40 mg total) by mouth daily at 12 noon. 08/16/14   Barton Dubois, MD  tamoxifen (NOLVADEX) 20 MG tablet Take 1 tablet (20 mg total) by mouth daily. 03/22/15   Nicholas Lose, MD  venlafaxine XR (EFFEXOR-XR) 37.5 MG 24 hr capsule Take 1 capsule (37.5 mg total) by mouth daily with breakfast. 03/22/15   Nicholas Lose, MD   BP 124/79 mmHg  Pulse 72  Resp 18  SpO2 97% Physical Exam  Constitutional: She is oriented to person, place, and time. She appears well-developed and well-nourished. No distress.  HENT:  Head: Atraumatic.  Mouth/Throat: Oropharynx is clear and moist.  Eyes: Conjunctivae are normal.  Neck: Neck supple. No JVD present.  No nuchal rigidity  Cardiovascular: Normal rate and regular rhythm.   Pulmonary/Chest: She has wheezes (Inspiratory and expiratory wheezes with rhonchi is heard).  Abdominal: Soft. Bowel sounds are normal. There is no tenderness.  Negative Murphy sign, no pain at McBurney's point  Musculoskeletal: She exhibits no edema.  Neurological: She is alert and oriented to person, place, and time.  Skin: No rash noted.  Psychiatric: She has a normal mood and affect.  Nursing note and vitals reviewed.   ED Course  Procedures (including critical care time)  Patient presents with flulike symptoms. She does also have wheezes, on the examination. Chest x-ray ordered to rule out pneumonia. Symptoms likely viral in etiology. Blood work obtained. Workup initiated.  11:53 AM Chest x-ray without evidence of pneumonia. Labs are reassuring. Patient is afebrile without evidence of hypoxia. Wheezing improves after breathing treatment. Able to ambulate while maintaining adequate oxygenation.  12:37 PM Patient felt better, stable for discharge. She has inhaler at home that she can use. Cough medication prescribed. Patient requesting for work note. Return percussion  discussed.  Labs Review Labs Reviewed  COMPREHENSIVE METABOLIC PANEL - Abnormal; Notable for the following:    Total Protein 8.8 (*)    All other components within normal limits  CBC WITH DIFFERENTIAL/PLATELET  LIPASE, BLOOD    Imaging Review Dg Chest 2 View  04/12/2015  CLINICAL DATA:  Cough, congestion worsening over the last 2 weeks. EXAM: CHEST  2 VIEW COMPARISON:  08/15/2014 FINDINGS: Left Port-A-Cath remains in place, unchanged. Mild hyperinflation. Heart is normal size. No confluent opacities or effusions. No acute bony abnormality. IMPRESSION: Mild hyperinflation.  No active disease. Electronically Signed   By: Rolm Baptise M.D.   On: 04/12/2015 10:48   I have personally reviewed and evaluated these images and lab results as part of my medical decision-making.   EKG Interpretation None      MDM  Final diagnoses:  Flu-like symptoms  Nausea vomiting and diarrhea    BP 145/79 mmHg  Pulse 110  Temp(Src) 98.1 F (36.7 C) (Oral)  Resp 20  SpO2 98% Tachycardia 2/2 albuterol breathing treatment.     Domenic Moras, PA-C 04/12/15 1242  Lacretia Leigh, MD 04/13/15 [phone]

## 2015-04-12 NOTE — ED Notes (Signed)
Patient d/c'd self care.  F/U and medications discussed, patient verbalized understanding.

## 2015-04-12 NOTE — Progress Notes (Signed)
Received referral appt from [facility], sent to scan.

## 2015-04-28 ENCOUNTER — Telehealth: Payer: Self-pay | Admitting: Hematology and Oncology

## 2015-04-28 NOTE — Telephone Encounter (Signed)
Patient called in to cancel her 12/13 tamoxifen follow up.  She states that she took it for three days and it made her stomach upset and then she developed a cough cold and went to the er for this and they gave her meds.  She stopped the tamoxifen and wishes to get completely better and will restart it and will call back to reschedule.  Will make the desk nurse aware of this message  anne

## 2015-05-02 ENCOUNTER — Ambulatory Visit: Payer: Medicaid Other | Admitting: Hematology and Oncology

## 2015-05-11 ENCOUNTER — Other Ambulatory Visit: Payer: Self-pay | Admitting: Family Medicine

## 2015-05-11 NOTE — Telephone Encounter (Signed)
Called pt- explained that we have not seen her here in 2 years and I have not seen her in at least 3 years. She endorses that she recently had breast cancer and now has medicaid which we do not accept- she is not sure how she will afford to see [place].  Explained that I sympathize with her situation but we simply cannot continue to write controlled substances to pts we are not actively seeing- perhaps her oncologist could help?  I will give her enough for 10 days

## 2015-05-23 ENCOUNTER — Telehealth: Payer: Self-pay | Admitting: *Deleted

## 2015-05-23 NOTE — Telephone Encounter (Signed)
Patient called.  She has held off on her tamoxifen because it made her sick - nausea and diarrhea.  However, she plans on re-starting it again next week.  She is seeing her orthopedic doctor this week after an MRI for bone spurs.  She takes xanax about 3 times a day. In the past [facility] with Dr. Edilia Bo has filled this.  However, she has not seen them in 2 years and they will not fill this without seeing her.  She is wondering if Dr.Gudena will take over ordering her xanax for her.  Will sent a note to him regarding this.  Her call back number is [phone].  She may be at work from [employer]  till [place].

## 2015-05-24 NOTE — Progress Notes (Signed)
HPI: FU SVT. She apparently had an episode in the ambulance and was documented to be as high as 200, but the strips are not available. Stress echocardiogram in October 2008 showed normal LV function and there was no ischemia. Previous monitor in October of 2008 showed sinus with occasional PACs and PVCs. Echocardiogram January 2016 showed normal LV function. Grade 1 diastolic dysfunction. Since last seen,   Current Outpatient Prescriptions  Medication Sig Dispense Refill  . ALPRAZolam (XANAX) 0.5 MG tablet TAKE 1 TABLET 3 TIMES A DAY 30 tablet 0  . atenolol (TENORMIN) 50 MG tablet Take 1 tablet (50 mg total) by mouth 2 (two) times daily. 60 tablet 6  . benzonatate (TESSALON) 100 MG capsule Take 1 capsule (100 mg total) by mouth every 8 (eight) hours. 21 capsule 0  . Naproxen Sodium (ALEVE) 220 MG CAPS Take 220-440 mg by mouth as needed (pain).     Marland Kitchen omeprazole (PRILOSEC OTC) 20 MG tablet Take 20 mg by mouth daily as needed (acid reflux).    Marland Kitchen oxyCODONE-acetaminophen (PERCOCET/ROXICET) 5-325 MG tablet Take 1 tablet by mouth every 8 (eight) hours as needed for severe pain. 60 tablet 0  . pantoprazole (PROTONIX) 40 MG tablet Take 1 tablet (40 mg total) by mouth daily at 12 noon. 30 tablet 1  . promethazine-dextromethorphan (PROMETHAZINE-DM) 6.25-15 MG/5ML syrup Take 5 mLs by mouth 4 (four) times daily as needed for cough. 118 mL 0  . tamoxifen (NOLVADEX) 20 MG tablet Take 1 tablet (20 mg total) by mouth daily. 90 tablet 3  . venlafaxine XR (EFFEXOR-XR) 37.5 MG 24 hr capsule Take 1 capsule (37.5 mg total) by mouth daily with breakfast. 30 capsule 12   No current facility-administered medications for this visit.     Past Medical History  Diagnosis Date  . 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), hypoglycemia 2/04-resolved with PO calcium + D, SVT, was treated with iterferon and ribavirin-10/3-2/04  . PUD  (peptic ulcer disease)   . Fibromyalgia   . Rheumatoid arthritis(714.0)     possible  . SVT (supraventricular tachycardia) ([place])   . Depression   . Osteoporosis   . Hepatitis C     Pt was given too much interferon - critical care x2d, 5 days total hosp  . Anxiety   . Wears glasses   . Heavy smoker   . Cancer [facility])     right breast cancer    Past Surgical History  Procedure Laterality Date  . Cesarean section  09/17/80  . Dilation and curettage of uterus    . Hysterectomy-for dysmenorrhea  09/17/00  . Nasal sinus surgery  03/20/84    for deviated septum  . Tonsillectomy  05/20/61  . Abdominal hysterectomy    . Colonoscopy    . Upper gi endoscopy    . Portacath placement Left 01/20/2014    Procedure: INSERTION PORT-A-CATH;  Surgeon: [name], MD;  Location: [place];  Service: General;  Laterality: Left;  Marland Kitchen Mastectomy w/ sentinel node biopsy Right 07/28/2014    Procedure: RIGHT MASTECTOMY WITH SENTINEL LYMPH NODE BIOPSY ;  Surgeon: [name], MD;  Location: [place];  Service: General;  Laterality: Right;    Social History   Social History  . Marital Status: Divorced    Spouse Name: N/A  . Number of Children: N/A  . Years of Education: N/A   Occupational History  .  Not on file.   Social History Main Topics  . Smoking status: Current Every Day Smoker -- 1.00 packs/day for 45 years    Types: Cigarettes  . Smokeless tobacco: Never Used     Comment: 2 ppd since 61 yo   . Alcohol Use: Yes     Comment: denies; reports 3-4 beers/night to Dr. 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 Sexual Activity: Not Currently    Birth Control/ Protection: Surgical   Other Topics Concern  . Not on file   Social History Narrative   Divorced, lives with son Grayland Ormond (born 58); parents nearby.  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    Hepatitis B & C   Has 2 dogs and 5 cats   Did inpatient substance abuse rehab in 1993          ROS: no fevers or chills, productive cough, hemoptysis, dysphasia, odynophagia, melena, hematochezia, dysuria, hematuria, rash, seizure activity, orthopnea, PND, pedal edema, claudication. Remaining systems are negative.  Physical Exam: Well-developed well-nourished in no acute distress.  Skin is warm and dry.  HEENT is normal.  Neck is supple.  Chest is clear to auscultation with normal expansion.  Cardiovascular exam is regular rate and rhythm.  Abdominal exam nontender or distended. No masses palpated. Extremities show no edema. neuro grossly intact  ECG     This encounter was created in error - please disregard.

## 2015-05-25 ENCOUNTER — Telehealth: Payer: Self-pay

## 2015-05-25 MED ORDER — ALPRAZOLAM 0.5 MG PO TABS: 0.5000 mg | ORAL_TABLET | Freq: Three times a day (TID) | ORAL | Status: DC

## 2015-05-25 NOTE — Telephone Encounter (Signed)
Called in #15.  Called to discuss with pt.  This is the absolute last fill until she comes in.  She understands.  She will avoid combining with percocet

## 2015-05-25 NOTE — Telephone Encounter (Signed)
Pt called requesting (a temporary 5 day) Xanax refill/// She stated that she only has Medicaid insurance and that she has been advised that OV is required.... She requested the message be forwarded anyway...  Actions: Informed pt that if Dr. Lorelei Pont has advised that OV is required prior to refill and due to the fact that we do not have a contract with Medicaid; my best advice is to consider an OV with [facility] (who is contracted with Medicaid), if coming into see Dr. Lorelei Pont proves to be too difficult/costly (due to her concerns about how to pay for the visit + meds)  [phone]

## 2015-05-26 ENCOUNTER — Encounter: Payer: Medicaid Other | Admitting: Cardiology

## 2015-05-30 ENCOUNTER — Ambulatory Visit (INDEPENDENT_AMBULATORY_CARE_PROVIDER_SITE_OTHER): Payer: Self-pay | Admitting: Internal Medicine

## 2015-05-30 VITALS — BP 102/76 | HR 55 | Temp 97.7°F | Resp 16 | Ht 64.0 in | Wt 143.2 lb

## 2015-05-30 DIAGNOSIS — F411 Generalized anxiety disorder: Secondary | ICD-10-CM

## 2015-05-30 MED ORDER — ALPRAZOLAM 0.5 MG PO TABS: 0.5000 mg | ORAL_TABLET | Freq: Three times a day (TID) | ORAL | Status: DC

## 2015-05-30 NOTE — Progress Notes (Signed)
Subjective:  By signing my name below, I, Rawaa Al Rifaie, attest that this documentation has been prepared under the direction and in the presence of Tami Lin, MD.  Royann Shivers Rifaie, Medical Scribe. 05/30/2015.  11:04 AM.   Patient ID: [name], female    DOB: February 24, 1956, 60 y.o.   MRN: [id number]  Chief Complaint  Patient presents with  . Medication Refill    Xanax    HPI HPI Comments: [name] is a 60 y.o. female who presents to [facility] requesting a medication refill for xanax 0.5 mg.  Pt reports that she has been taking xanax intermittently over the past few years. She indicates that since his father passed away with her diagnoses of breast cancer 6 months after, she started experiencing depression/anxiety symptoms, and since then she has been using the medication--Dr Copland in our practice. Pt notes that she does find relief with the medication, and indicates that she is able to sleep at night. Pt states that she is currently in remission from her cancer, however she reports that her RA has been becoming more severe, and states that she has been having severe back pain for which she has been followed up with by Dr. Lindi Adie. She notes that due to her illnesses, she has lost her job, and have had much trouble finding a new one. She is currently working as a Scientist, water quality at [employer].    Patient Active Problem List   Diagnosis Date Noted  . Osteoarthritis 03/22/2015  . Cough   . Hypokalemia 08/15/2014  . Nausea with vomiting 08/15/2014  . Diarrhea 08/15/2014  . Flu-like symptoms 08/15/2014  . Breast cancer, female ([place]) 07/28/2014  . Chemotherapy induced nausea and vomiting 03/28/2014  . Palmar plantar erythrodysaesthesia 03/18/2014  . Rash 03/04/2014  . Bronchitis 03/04/2014  . Anorexia 03/04/2014  . Altered taste 03/04/2014  . Weight loss 03/04/2014  . Insomnia 03/04/2014  . Transaminitis 03/04/2014  . Extremity edema 02/25/2014  .  Dehydration 02/25/2014  . Thrush of mouth and esophagus ([place]) 02/02/2014  . Diarrhea due to drug 02/02/2014  . Breast cancer of upper-outer quadrant of right female breast ([place]) 12/20/2013  . Rheumatoid arthritis ([place]) 04/03/2013  . TOBACCO ABUSE 06/16/2009  . PSVT 06/16/2009  . FIBROMYALGIA 06/16/2009  . HEPATITIS B 07/17/2006  . HEPATITIS C 07/17/2006  . GASTROESOPHAGEAL REFLUX, NO ESOPHAGITIS 07/17/2006   Past Medical History  Diagnosis Date  . 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), hypoglycemia 2/04-resolved with PO calcium + D, SVT, was treated with iterferon and ribavirin-10/3-2/04  . PUD (peptic ulcer disease)   . Fibromyalgia   . Rheumatoid arthritis(714.0)     possible  . SVT (supraventricular tachycardia) ([place])   . Depression   . Osteoporosis   . Hepatitis C     Pt was given too much interferon - critical care x2d, 5 days total hosp  . Anxiety   . Wears glasses   . Heavy smoker   . Cancer [facility])     right breast cancer   Past Surgical History  Procedure Laterality Date  . Cesarean section  09/17/80  . Dilation and curettage of uterus    . Hysterectomy-for dysmenorrhea  09/17/00  . Nasal sinus surgery  03/20/84    for deviated septum  . Tonsillectomy  05/20/61  . Abdominal hysterectomy    . Colonoscopy    . Upper gi endoscopy    .  Portacath placement Left 01/20/2014    Procedure: INSERTION PORT-A-CATH;  Surgeon: [name], MD;  Location: [place];  Service: General;  Laterality: Left;  Marland Kitchen Mastectomy w/ sentinel node biopsy Right 07/28/2014    Procedure: RIGHT MASTECTOMY WITH SENTINEL LYMPH NODE BIOPSY ;  Surgeon: [name], MD;  Location: [place];  Service: General;  Laterality: Right;   Allergies  Allergen Reactions  . Codeine Itching and Nausea Only    Pt has had codeine with pre-administration to prevent reaction without any problem.   Johnnye Sima  [Eszopiclone] Rash   Prior to Admission medications   Medication Sig Start Date End Date Taking? Authorizing Provider  ALPRAZolam Duanne Moron) 0.5 MG tablet Take 1 tablet (0.5 mg total) by mouth 3 (three) times daily. 05/25/15  Yes Gay Filler Copland, MD  oxyCODONE-acetaminophen (PERCOCET) 10-325 MG tablet Take 1 tablet by mouth every 4 (four) hours as needed for pain.   Yes Historical Provider, MD  venlafaxine XR (EFFEXOR-XR) 37.5 MG 24 hr capsule Take 1 capsule (37.5 mg total) by mouth daily with breakfast. 03/22/15  Yes Nicholas Lose, MD  atenolol (TENORMIN) 50 MG tablet Take 1 tablet (50 mg total) by mouth 2 (two) times daily. Patient not taking: Reported on 05/30/2015 11/22/14   Brett Canales, PA-C  Naproxen Sodium (ALEVE) 220 MG CAPS Take 220-440 mg by mouth as needed (pain). Reported on 05/30/2015    Historical Provider, MD  omeprazole (PRILOSEC OTC) 20 MG tablet Take 20 mg by mouth daily as needed (acid reflux). Reported on 05/30/2015    Historical Provider, MD  oxyCODONE-acetaminophen (PERCOCET/ROXICET) 5-325 MG tablet Take 1 tablet by mouth every 8 (eight) hours as needed for severe pain. Patient not taking: Reported on 05/30/2015 03/30/15   Nicholas Lose, MD  pantoprazole (PROTONIX) 40 MG tablet Take 1 tablet (40 mg total) by mouth daily at 12 noon. Patient not taking: Reported on 05/30/2015 08/16/14   Barton Dubois, MD  tamoxifen (NOLVADEX) 20 MG tablet Take 1 tablet (20 mg total) by mouth daily. Patient not taking: Reported on 05/30/2015 03/22/15   Nicholas Lose, MD   Social History   Social History  . Marital Status: Divorced    Spouse Name: N/A  . Number of Children: N/A  . Years of Education: N/A   Occupational History  . Not on file.   Social History Main Topics  . Smoking status: Current Every Day Smoker -- 1.00 packs/day for 45 years    Types: Cigarettes  . Smokeless tobacco: Never Used     Comment: 2 ppd since 60 yo   . Alcohol Use: Yes     Comment: denies; reports 3-4 beers/night to  Dr. 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 Sexual Activity: Not Currently    Birth Control/ Protection: Surgical   Other Topics Concern  . Not on file   Social History Narrative   Divorced, lives with son [name] (born [birth year]); parents nearby. 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  Psychiatric/Behavioral: Negative for sleep  disturbance. The patient is nervous/anxious.       Objective:   Physical Exam  Constitutional: She is oriented to person, place, and time. She appears well-developed and well-nourished. No distress.  HENT:  Head: Normocephalic and atraumatic.  Eyes: EOM are normal. Pupils are equal, round, and reactive to light.  Neck: Neck supple.  Cardiovascular: Normal rate.   Pulmonary/Chest: Effort normal.  Neurological: She is alert and oriented to person, place, and time. No cranial nerve deficit.  Skin: Skin is warm and dry.  Psychiatric: She has a normal mood and affect. Her behavior is normal.  Nursing note and vitals reviewed.   BP 102/76 mmHg  Pulse 55  Temp(Src) 97.7 F (36.5 C) (Oral)  Resp 16  Ht 5\' 4"  (1.626 m)  Wt 143 lb 3.2 oz (64.955 kg)  BMI 24.57 kg/m2  SpO2 96%     Assessment & Plan:  GAD (generalized anxiety disorder)  Meds ordered this encounter  Medications  . ALPRAZolam (XANAX) 0.5 MG tablet    Sig: Take 1 tablet (0.5 mg total) by mouth 3 (three) times daily.    Dispense:  90 tablet    Refill:  5   F/u 59mo  I have completed the patient encounter in its entirety as documented by the scribe, with editing by me where necessary. Maedell Hedger P. Laney Pastor, M.D.

## 2015-06-09 ENCOUNTER — Encounter: Payer: Self-pay | Admitting: Family Medicine

## 2015-06-12 NOTE — Progress Notes (Signed)
HPI: FU SVT. She apparently had an episode in the ambulance and was documented to be as high as 200, but the strips are not available. Stress echocardiogram in October 2008 showed normal LV function and there was no ischemia. Previous monitor in October of 2008 showed sinus with occasional PACs and PVCs. Echocardiogram January 2016 showed normal LV function, moderate left ventricular hypertrophy, grade 1 diastolic dysfunction. Since I last saw her, Patient denies dyspnea on exertion, orthopnea, PND, pedal edema, chest pain or syncope. Occasional brief palpitations.  Current Outpatient Prescriptions  Medication Sig Dispense Refill  . ALPRAZolam (XANAX) 0.5 MG tablet Take 1 tablet (0.5 mg total) by mouth 3 (three) times daily. 90 tablet 5  . atenolol (TENORMIN) 50 MG tablet Take 1 tablet (50 mg total) by mouth 2 (two) times daily. 60 tablet 6  . oxyCODONE (OXYCONTIN) 20 mg 12 hr tablet Take 20 mg by mouth every 12 (twelve) hours.    Marland Kitchen venlafaxine XR (EFFEXOR-XR) 37.5 MG 24 hr capsule Take 1 capsule (37.5 mg total) by mouth daily with breakfast. 30 capsule 12   No current facility-administered medications for this visit.     Past Medical History  Diagnosis Date  . 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), hypoglycemia 2/04-resolved with PO calcium + D, SVT, was treated with iterferon and ribavirin-10/3-2/04  . PUD (peptic ulcer disease)   . Fibromyalgia   . Rheumatoid arthritis(714.0)     possible  . SVT (supraventricular tachycardia) ([place])   . Depression   . Osteoporosis   . Hepatitis C     Pt was given too much interferon - critical care x2d, 5 days total hosp  . Anxiety   . Wears glasses   . Heavy smoker   . Cancer [facility])     right breast cancer    Past Surgical History  Procedure Laterality Date  . Cesarean section  09/17/80  . Dilation and curettage of uterus    . Hysterectomy-for dysmenorrhea   09/17/00  . Nasal sinus surgery  03/20/84    for deviated septum  . Tonsillectomy  05/20/61  . Abdominal hysterectomy    . Colonoscopy    . Upper gi endoscopy    . Portacath placement Left 01/20/2014    Procedure: INSERTION PORT-A-CATH;  Surgeon: [name], MD;  Location: [place];  Service: General;  Laterality: Left;  Marland Kitchen Mastectomy w/ sentinel node biopsy Right 07/28/2014    Procedure: RIGHT MASTECTOMY WITH SENTINEL LYMPH NODE BIOPSY ;  Surgeon: [name], MD;  Location: [place];  Service: General;  Laterality: Right;    Social History   Social History  . Marital Status: Divorced    Spouse Name: N/A  . Number of Children: N/A  . Years of Education: N/A   Occupational History  . Not on file.   Social History Main Topics  . Smoking status: Current Every Day Smoker -- 1.00 packs/day for 45 years    Types: Cigarettes  . Smokeless tobacco: Never Used     Comment: 2 ppd since 60 yo   . Alcohol Use: 0.0 oz/week    0 Standard drinks or equivalent per week     Comment: denies; reports 3-4 beers/night to Dr. 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 Drug Use: No     Comment: had perscription drug abuse problem in  1993; previous IVDU/inranasal cocaine in her 20s-contracted  . Sexual Activity: Not Currently    Birth Control/ Protection: Surgical   Other Topics Concern  . Not on file   Social History Narrative   Divorced, lives with son Grayland Ormond (born 24); parents nearby. 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 Colon cancer Neg Hx   . Cancer Father 35    lung  . Dementia Father   . Dementia Mother   . Hypertension Mother   . Hyperlipidemia Mother   . Breast cancer Maternal Aunt   . Cancer Paternal Uncle   . Breast cancer  Maternal Aunt   . Breast cancer Maternal Aunt     ROS: no fevers or chills, productive cough, hemoptysis, dysphasia, odynophagia, melena, hematochezia, dysuria, hematuria, rash, seizure activity, orthopnea, PND, pedal edema, claudication. Remaining systems are negative.  Physical Exam: Well-developed well-nourished in no acute distress.  Skin is warm and dry.  HEENT is normal.  Neck is supple.  Chest With diminished breath sounds throughout Cardiovascular exam is regular rate and rhythm.  Abdominal exam nontender or distended. No masses palpated. Extremities show no edema. neuro grossly intact  ECG Sinus rhythm at a rate of 72.RV conduction delay. Nonspecific ST changes.

## 2015-06-14 ENCOUNTER — Encounter: Payer: Self-pay | Admitting: Family Medicine

## 2015-06-23 ENCOUNTER — Ambulatory Visit (INDEPENDENT_AMBULATORY_CARE_PROVIDER_SITE_OTHER): Payer: Medicaid Other | Admitting: Cardiology

## 2015-06-23 ENCOUNTER — Encounter: Payer: Self-pay | Admitting: Cardiology

## 2015-06-23 VITALS — BP 100/60 | HR 72 | Ht 64.0 in | Wt 144.8 lb

## 2015-06-23 DIAGNOSIS — I471 Supraventricular tachycardia: Secondary | ICD-10-CM

## 2015-06-23 DIAGNOSIS — F17200 Nicotine dependence, unspecified, uncomplicated: Secondary | ICD-10-CM | POA: Diagnosis not present

## 2015-06-23 NOTE — Assessment & Plan Note (Signed)
Patient counseled on discontinuing. 

## 2015-06-23 NOTE — Patient Instructions (Signed)
Your physician wants you to follow-up in: ONE YEAR WITH DR CRENSHAW You will receive a reminder letter in the mail two months in advance. If you don't receive a letter, please call our office to schedule the follow-up appointment.   If you need a refill on your cardiac medications before your next appointment, please call your pharmacy.  

## 2015-06-23 NOTE — Assessment & Plan Note (Signed)
No recent symptoms. Continue beta blocker. We could consider referral forAblation in the future if she has frequent episodes.

## 2015-08-10 ENCOUNTER — Telehealth: Payer: Self-pay | Admitting: *Deleted

## 2015-08-10 NOTE — Telephone Encounter (Signed)
Call received by TRIAGE from pt stating she would like to proceed with having her port removed.  She states she has contacted Dr Marlowe Aschoff office but was requested to contact this office for oncology MD to ok to remove.  Pt states she was to have port removed last year " but due to possible cancer recurrence then I had to be worked up for rheumatoid arthritis it never got done "  Of note pt canceled last appointment with Dr Lindi Adie in December 2016 and did not reschedule.  This note/request will be sent to MD and RN at desk for appropriate follow up.   Return call number given by pt as [phone].

## 2015-08-14 ENCOUNTER — Other Ambulatory Visit: Payer: Self-pay | Admitting: General Surgery

## 2015-08-21 DIAGNOSIS — M7500 Adhesive capsulitis of unspecified shoulder: Secondary | ICD-10-CM | POA: Insufficient documentation

## 2015-08-21 DIAGNOSIS — M5134 Other intervertebral disc degeneration, thoracic region: Secondary | ICD-10-CM | POA: Insufficient documentation

## 2015-08-21 DIAGNOSIS — M542 Cervicalgia: Secondary | ICD-10-CM | POA: Insufficient documentation

## 2015-08-21 DIAGNOSIS — G894 Chronic pain syndrome: Secondary | ICD-10-CM | POA: Insufficient documentation

## 2015-11-01 ENCOUNTER — Other Ambulatory Visit: Payer: Self-pay | Admitting: Physician Assistant

## 2015-11-02 NOTE — Telephone Encounter (Signed)
Rx request sent to pharmacy.  

## 2015-11-14 ENCOUNTER — Telehealth: Payer: Self-pay | Admitting: Internal Medicine

## 2015-11-16 ENCOUNTER — Telehealth: Payer: Self-pay

## 2015-11-16 MED ORDER — ALPRAZOLAM 0.5 MG PO TABS: 0.5000 mg | ORAL_TABLET | Freq: Three times a day (TID) | ORAL | Status: DC

## 2015-11-16 NOTE — Telephone Encounter (Signed)
fax request for Alprazolam o.5mg  tab #90  Sig: 1 tablet TID  refills x 5 to [facility] pool

## 2015-11-16 NOTE — Telephone Encounter (Signed)
Meds ordered this encounter  Medications  . ALPRAZolam (XANAX) 0.5 MG tablet    Sig: Take 1 tablet (0.5 mg total) by mouth 3 (three) times daily.    Dispense:  90 tablet    Refill:  0    Order Specific Question:  Supervising Provider    Answer:  Leandrew Koyanagi [id number]    Patient was last seen here 05/2015 and advised to follow-up in 6 months.  Chart indicates that Dr. Lorelei Pont is PCP.   Patient needs to arrange to see Dr. Lorelei Pont at her new practice, or return here to establish with a different provider, before she runs out of this supply.

## 2015-11-16 NOTE — Addendum Note (Signed)
Addended by: Fara Chute on: 11/16/2015 04:54 PM   Modules accepted: Orders

## 2015-11-17 NOTE — Telephone Encounter (Signed)
Patient called after hours service at 9:30pm on 11/16/15 expressing concern that [facility].  Review of chart confirms that [facility] [place] approved refill on 11/16/15.  [facility].

## 2015-11-17 NOTE — Telephone Encounter (Signed)
Faxed RF. Advised pt on VM that she needs f/up w/Dr Copland or new PCP here.

## 2015-12-11 ENCOUNTER — Ambulatory Visit (HOSPITAL_COMMUNITY)
Admission: EM | Admit: 2015-12-11 | Discharge: 2015-12-11 | Disposition: A | Discharge status: Home / Self Care | Payer: Medicaid Other

## 2015-12-11 ENCOUNTER — Encounter (HOSPITAL_COMMUNITY): Payer: Self-pay | Admitting: *Deleted

## 2015-12-11 ENCOUNTER — Ambulatory Visit: Payer: Self-pay

## 2015-12-11 DIAGNOSIS — F419 Anxiety disorder, unspecified: Secondary | ICD-10-CM

## 2015-12-11 NOTE — ED Provider Notes (Signed)
CSN: [id number]     Arrival date & time 12/11/15  1051 History   None    Chief Complaint  Patient presents with  . Medication Refill   (Consider location/radiation/quality/duration/timing/severity/associated sxs/prior Treatment) Patient states she was told by her PCP at [facility] to come here for xanax refills.  She has hx of anxiety and depression and she wants to go up on her xanax due to anxiety increases in her life.  She denies any suicidal or Homicidal ideation.   The history is provided by the patient.  Medication Refill  Medications/supplies requested:  Xanax Reason for request:  Clinic/provider not available Medications taken before: yes - see home medications   Patient has complete original prescription information: yes     Past Medical History:  Diagnosis Date  . Anxiety   . Cancer [facility])    right breast cancer  . Depression   . Fibromyalgia   . Heavy smoker   . Hepatitis C    Pt was given too much interferon - critical care x2d, 5 days total hosp  . Osteoporosis   . 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), hypoglycemia 2/04-resolved with PO calcium + D, SVT, was treated with iterferon and ribavirin-10/3-2/04  . PUD (peptic ulcer disease)   . Rheumatoid arthritis(714.0)    possible  . SVT (supraventricular tachycardia) ([place])   . Wears glasses    Past Surgical History:  Procedure Laterality Date  . ABDOMINAL HYSTERECTOMY    . CESAREAN SECTION  09/17/80  . COLONOSCOPY    . DILATION AND CURETTAGE OF UTERUS    . hysterectomy-for dysmenorrhea  09/17/00  . MASTECTOMY W/ SENTINEL NODE BIOPSY Right 07/28/2014   Procedure: RIGHT MASTECTOMY WITH SENTINEL LYMPH NODE BIOPSY ;  Surgeon: [name], MD;  Location: [place];  Service: General;  Laterality: Right;  . NASAL SINUS SURGERY  03/20/84   for deviated septum  . PORTACATH PLACEMENT Left 01/20/2014   Procedure: INSERTION  PORT-A-CATH;  Surgeon: [name], MD;  Location: [place];  Service: General;  Laterality: Left;  . TONSILLECTOMY  05/20/61  . UPPER GI ENDOSCOPY     Family History  Problem Relation Age of Onset  . Cancer Father 67    lung  . Dementia Father   . Dementia Mother   . Hypertension Mother   . Hyperlipidemia Mother   . Breast cancer Maternal Aunt   . Cancer Paternal Uncle   . Breast cancer Maternal Aunt   . Breast cancer Maternal Aunt   . Colon cancer Neg Hx    Social History  Substance Use Topics  . Smoking status: Current Every Day Smoker    Packs/day: 1.00    Years: 45.00    Types: Cigarettes  . Smokeless tobacco: Never Used     Comment: 2 ppd since 60 yo   . Alcohol use 0.0 oz/week     Comment: denies; reports 3-4 beers/night to Dr. Hubbard Robinson with 6 pack on weekend; hx of 12 pack/day EtOH abuse in 93    OB History    Gravida Para Term Preterm AB Living   1 1 1     1    SAB TAB Ectopic Multiple Live Births                 Review of Systems  Constitutional: Negative.   HENT: Negative.   Eyes: Negative.   Respiratory: Negative.   Cardiovascular: Negative.  Gastrointestinal: Negative.   Endocrine: Negative.   Genitourinary: Negative.   Musculoskeletal: Negative.   Allergic/Immunologic: Negative.   Neurological: Negative.   Hematological: Negative.   Psychiatric/Behavioral: Positive for agitation. Negative for sleep disturbance and suicidal ideas. The patient is nervous/anxious.     Allergies  Codeine and Lunesta [eszopiclone]  Home Medications   Prior to Admission medications   Medication Sig Start Date End Date Taking? Authorizing Provider  ALPRAZolam Duanne Moron) 0.5 MG tablet Take 1 tablet (0.5 mg total) by mouth 3 (three) times daily. 11/16/15   Chelle Jeffery, PA-C  atenolol (TENORMIN) 50 MG tablet TAKE 1 TABLET (50 MG TOTAL) BY MOUTH 2 (TWO) TIMES DAILY. 11/02/15   Lelon Perla, MD  oxyCODONE (OXYCONTIN) 20 mg 12 hr tablet Take 20 mg by  mouth every 12 (twelve) hours.    Historical Provider, MD  venlafaxine XR (EFFEXOR-XR) 37.5 MG 24 hr capsule Take 1 capsule (37.5 mg total) by mouth daily with breakfast. 03/22/15   Nicholas Lose, MD   Meds Ordered and Administered this Visit  Medications - No data to display  BP 138/86 (BP Location: Left Arm)   Pulse 70   Temp 98.5 F (36.9 C) (Oral)   SpO2 97%  No data found.   Physical Exam  Constitutional: She appears well-developed and well-nourished.  HENT:  Head: Normocephalic and atraumatic.  Eyes: EOM are normal. Pupils are equal, round, and reactive to light.  Cardiovascular: Normal rate, regular rhythm and normal heart sounds.   Pulmonary/Chest: Effort normal and breath sounds normal.  Abdominal: Soft. Bowel sounds are normal.    Urgent Care Course   Clinical Course    Procedures (including critical care time)  Labs Review Labs Reviewed - No data to display  Imaging Review No results found.   Visual Acuity Review  Right Eye Distance:   Left Eye Distance:   Bilateral Distance:    Right Eye Near:   Left Eye Near:    Bilateral Near:         MDM  Anxiety - I explained to patient that we are Urgent Care and not PCP and cannot manage her depression and anxiety.  She states her PCP at [facility] sent her over here.  I called [place] and they recommend she call ASAP and they will see her and refill her Xanax.  I explained this to patient who was happy to get in with her PCP at [facility] and will call today and follow up with [place] .    Lysbeth Penner, FNP 12/11/15 [phone]

## 2015-12-11 NOTE — ED Triage Notes (Signed)
Pt  Presents  Requesting  Refill  Of  Some  Of  Her meds   She  States   She   Is  No  Longer  Going  To  Her pcp    She  Voices  No   Physical  Symptoms

## 2015-12-11 NOTE — Discharge Instructions (Signed)
Please Call [facility] today.  I have called their office and you are still a patient there and they will see you ASAP for medication refills.

## 2015-12-12 ENCOUNTER — Telehealth: Payer: Self-pay

## 2015-12-12 MED ORDER — ALPRAZOLAM 0.5 MG PO TABS: 0.5000 mg | ORAL_TABLET | Freq: Three times a day (TID) | ORAL | 0 refills | Status: DC

## 2015-12-12 NOTE — Telephone Encounter (Signed)
Faxed Rx and notified pt on VM 

## 2015-12-12 NOTE — Telephone Encounter (Signed)
Left message for pt to call back  °

## 2015-12-12 NOTE — Telephone Encounter (Signed)
Patient is calling to see if she can get a 30 day refill for xanax. Patient was here yesterday and was expecting to get billed with the no insured discount. I had to explained to the patient that we do not bill and since she has medicaid she will not get the disciount. Patient found a new primary that accepts medicaid and it waiting on medicaid to change her primary information before she can get her medication refill. Patient would like to know if we can fill xanax one last time. Please advise!  [phone]

## 2015-12-12 NOTE — Telephone Encounter (Signed)
[phone]   [facility] [address]

## 2015-12-25 ENCOUNTER — Encounter (HOSPITAL_COMMUNITY): Payer: Self-pay | Admitting: *Deleted

## 2016-01-09 ENCOUNTER — Other Ambulatory Visit: Payer: Self-pay

## 2016-01-09 MED ORDER — ALPRAZOLAM 0.5 MG PO TABS: 0.5000 mg | ORAL_TABLET | Freq: Three times a day (TID) | ORAL | 0 refills | Status: DC

## 2016-01-09 NOTE — Telephone Encounter (Signed)
Patient is having trouble getting her medicaid card through social service and is unable to see her new provider. Patient isn't able to pay for an office visit and wants to know if she can get 15 or 30 days of xanax. Patient states that her medicaid card won't be ready until early September. Please advise!   [phone]

## 2016-01-09 NOTE — Telephone Encounter (Signed)
Meds ordered this encounter  Medications  . ALPRAZolam (XANAX) 0.5 MG tablet    Sig: Take 1 tablet (0.5 mg total) by mouth 3 (three) times daily.    Dispense:  90 tablet    Refill:  0    THIS IS THE SECOND NOTICE: the patient needs to RTC or go elsewhere to establish for new PCP. No additional fills of this medication will be authorized without a visit.

## 2016-01-09 NOTE — Telephone Encounter (Signed)
Pt last seen in Jan 2017 by Laney Pastor, last RF 7/25.

## 2016-01-10 NOTE — Telephone Encounter (Signed)
Notified pt of RF and Chelle's note. Pt agreed

## 2016-03-04 ENCOUNTER — Telehealth: Payer: Self-pay | Admitting: *Deleted

## 2016-03-04 NOTE — Telephone Encounter (Signed)
Voicemail : "I need to see Dr. Lindi Adie.  I"ve not been in, need a check up and breast exam and I'm having a lot of problems.  [phone]."  Called patient who reports for the last three months having severe throat soreness/discomfort with acid reflux.  I take two to three pills per day but it still burns.  Also having kidney or bladder problems.  I go a lot and can't empty my bladder well after sitting fifteen minutes.  I have pain to my right side, under breast bone in rib cage area.  This is new for the past three to four weeks.  I had mastectomy to right breast.  I could not take Tamoxifen, it made me feel sick."     Advised she contact PCP and t his nurse will notify Dr. Lindi Adie for rescheduling F/U from December 2016 and possible imaging or mammograms.   Denies seeing Dr. Edilia Bo and "has been followed up at [facility]."

## 2016-03-05 ENCOUNTER — Other Ambulatory Visit: Payer: Self-pay

## 2016-03-05 DIAGNOSIS — C50411 Malignant neoplasm of upper-outer quadrant of right female breast: Secondary | ICD-10-CM

## 2016-03-05 NOTE — Telephone Encounter (Signed)
Called pt back to discuss breast pain and to obtain further details regarding call placed to triage.  Unable to reach pt so left VM requesting pt call me back to discuss and so we can create a plan of care moving forward.  Upon chart review, pt last seen 03/22/15.  Last breast imaging shows MRI breast bilateral 06/07/14.  No indication of recent bilateral diagnostic mammogram in chart.  Scheduling message entered to schedule appointment for follow up and evaluation.  Contact information given to pt to call back to discuss.

## 2016-03-06 ENCOUNTER — Telehealth: Payer: Self-pay | Admitting: *Deleted

## 2016-03-06 NOTE — Telephone Encounter (Signed)
FYI "I need to know what to do.  I saw my PCP and then received a call from Dr. Geralyn Flash nurse that he also wants to see me." Advised to await a call from scheduler with appointments and Breast exams are needed.  Call transferred ext [phone].

## 2016-03-06 NOTE — Telephone Encounter (Signed)
Called pt back to let her know Dr. Lindi Adie would like for her to have diagnostic mammogram and [place] due to her new onset of breast pain.  I let her know she should be hearing from someone in scheduling to set this up for her but if she does not she should call GI to make these appts as soon as possible.  Informed pt to let us know if she has any additional questions or wishes to discuss further.  I offered appt for pt to be seen here if she wishes; however, imaging will give [place] best idea of if anything is going on that needs additional work up.  I explained once we had the appropriate imaging, we could follow up and create a plan of care, if needed.

## 2016-03-12 ENCOUNTER — Ambulatory Visit
Admission: RE | Admit: 2016-03-12 | Discharge: 2016-03-12 | Disposition: A | Discharge status: Home / Self Care | Payer: Medicaid Other | Source: Ambulatory Visit | Attending: Hematology and Oncology | Admitting: Hematology and Oncology

## 2016-03-12 DIAGNOSIS — C50411 Malignant neoplasm of upper-outer quadrant of right female breast: Secondary | ICD-10-CM

## 2016-03-18 ENCOUNTER — Ambulatory Visit (HOSPITAL_BASED_OUTPATIENT_CLINIC_OR_DEPARTMENT_OTHER): Payer: Medicaid Other | Admitting: Hematology and Oncology

## 2016-03-18 ENCOUNTER — Encounter: Payer: Self-pay | Admitting: Hematology and Oncology

## 2016-03-18 DIAGNOSIS — Z170 Estrogen receptor positive status [ER+]: Secondary | ICD-10-CM

## 2016-03-18 DIAGNOSIS — F329 Major depressive disorder, single episode, unspecified: Secondary | ICD-10-CM

## 2016-03-18 DIAGNOSIS — E876 Hypokalemia: Secondary | ICD-10-CM

## 2016-03-18 DIAGNOSIS — C50411 Malignant neoplasm of upper-outer quadrant of right female breast: Secondary | ICD-10-CM

## 2016-03-18 DIAGNOSIS — G629 Polyneuropathy, unspecified: Secondary | ICD-10-CM | POA: Diagnosis not present

## 2016-03-18 DIAGNOSIS — M549 Dorsalgia, unspecified: Secondary | ICD-10-CM | POA: Diagnosis not present

## 2016-03-18 MED ORDER — SERTRALINE HCL 50 MG PO TABS: 25.0000 mg | ORAL_TABLET | Freq: Every day | ORAL | Status: DC

## 2016-03-18 NOTE — Assessment & Plan Note (Signed)
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 Status post neoadjuvant chemotherapy with TCHPx2, GCHPx1, CHPx1, HPx2 , complete pathologic response with only residual DCIS 7 cm remaining. Anastrozole started 08/19/2014 stopped 09/18/2014, Herceptin stopped April 2016  Neuropathy: Slowly improving with time Hypokalemia: off potassium Depression: Switched because we started tamoxifen. to Effexor XR   Severe Back Pain: Cts and Bone Scan 03/20/15: Normal, she has profound arthritis and symmetrically in all joints. Patient will go back to seeing her rheumatologist Dr. Ouida Sills for her pain issues.  Tamoxifen toxicities:  Return to clinic in one year for follow-up and surveillance

## 2016-03-18 NOTE — Progress Notes (Signed)
Patient Care Team: Darreld Mclean, MD as PCP - General (Family Medicine) Nicholas Lose, MD as Consulting Physician (Hematology and Oncology) [name], MD as Consulting Physician (General Surgery) Thea Silversmith, MD as Consulting Physician (Radiation Oncology)  DIAGNOSIS:  Encounter Diagnosis  Name Primary?  . Malignant neoplasm of upper-outer quadrant of right breast in female, estrogen receptor positive ([place])     SUMMARY OF ONCOLOGIC HISTORY:   Breast cancer of upper-outer quadrant of right female breast ([place])   12/16/2013 Mammogram    1. A palpable 4.1 cm diameter mass/asymmetry with associated coarse heterogeneous microcalcifications at 11 o'clock 2 cm from the right nipple demonstrates mammographic and sonographic features highly suggestive of malignancy.       12/16/2013 Initial Biopsy    Breast cancer of upper-outer quadrant of right female breast; Invasive Ductal Cancer Er 100%, PR: 0%; Her2 Positive Ratio 4.28: Ki 67:24%;       12/23/2013 Breast MRI    Right Breast 4.4 x 3 x 2.5 cm and a sub cm satellite lesion. No LN      01/28/2014 - 05/27/2014 Neo-Adjuvant Chemotherapy    TC HP x2 cycles ( developed hand-foot syndrome) GC HP x1 cycle (developed dehydration and renal failure) CHP from 04/08/2014      07/21/2014 - 09/01/2014 Chemotherapy    Herceptin maintenance therapy      07/28/2014 Surgery    Right breast mastectomy: DCIS 7 cm, 4 lymph nodes negative, no invasive breast cancer was identified, complete pathologic response      08/19/2014 -  Anti-estrogen oral therapy    Anastrozole 1 mg daily stopped 09/23/2014 due to diffuse body aches, diarrhea, nausea, fatigue we switched her to tamoxifen in 03/22/2015       CHIEF COMPLIANT: Right-sided chest wall pain  INTERVAL HISTORY: [name] is a 60 year old with above-mentioned history of right-sided breast cancer treated with neoadjuvant chemotherapy followed by right mastectomy. She could not tolerate  anastrozole on tamoxifen. She is currently off all antiestrogen therapy. She came in complaining of right-sided chest wall pain. She had an ultrasound which was normal. She was told that she has arthritis. She also has a pressure-like sensation around the bladder which has been previously attributed to arthritis as well. She had lost her previous job and just found a new job working for [employer]. She is very happy about finding a new job.  REVIEW OF SYSTEMS:   Constitutional: Denies fevers, chills or abnormal weight loss Eyes: Denies blurriness of vision Ears, nose, mouth, throat, and face: Denies mucositis or sore throat Respiratory: Denies cough, dyspnea or wheezes Cardiovascular: Denies palpitation, chest discomfort Gastrointestinal:  Denies nausea, heartburn or change in bowel habits Skin: Denies abnormal skin rashes Lymphatics: Denies new lymphadenopathy or easy bruising Neurological:Denies numbness, tingling or new weaknesses Behavioral/Psych: Mood is stable, no new changes  Extremities: No lower extremity edema Breast: Right-sided chest wall discomfort All other systems were reviewed with the patient and are negative.  I have reviewed the past medical history, past surgical history, social history and family history with the patient and they are unchanged from previous note.  ALLERGIES:  is allergic to codeine and lunesta [eszopiclone].  MEDICATIONS:  Current Outpatient Prescriptions  Medication Sig Dispense Refill  . ALPRAZolam (XANAX) 0.5 MG tablet Take 1 tablet (0.5 mg total) by mouth 3 (three) times daily. 90 tablet 0  . atenolol (TENORMIN) 50 MG tablet TAKE 1 TABLET (50 MG TOTAL) BY MOUTH 2 (TWO) TIMES DAILY. 60 tablet 9  .  oxyCODONE (OXYCONTIN) 20 mg 12 hr tablet Take 20 mg by mouth every 12 (twelve) hours.    Marland Kitchen venlafaxine XR (EFFEXOR-XR) 37.5 MG 24 hr capsule Take 1 capsule (37.5 mg total) by mouth daily with breakfast. 30 capsule 12   No current  facility-administered medications for this visit.     PHYSICAL EXAMINATION: ECOG PERFORMANCE STATUS: 1 - Symptomatic but completely ambulatory  Vitals:   03/18/16 1129  BP: 123/83  Pulse: 74  Resp: 17  Temp: 98.1 F (36.7 C)   Filed Weights   03/18/16 1129  Weight: 162 lb 14.4 oz (73.9 kg)    GENERAL:alert, no distress and comfortable SKIN: skin color, texture, turgor are normal, no rashes or significant lesions EYES: normal, Conjunctiva are pink and non-injected, sclera clear OROPHARYNX:no exudate, no erythema and lips, buccal mucosa, and tongue normal  NECK: supple, thyroid normal size, non-tender, without nodularity LYMPH:  no palpable lymphadenopathy in the cervical, axillary or inguinal LUNGS: clear to auscultation and percussion with normal breathing effort HEART: regular rate & rhythm and no murmurs and no lower extremity edema ABDOMEN:abdomen soft, non-tender and normal bowel sounds MUSCULOSKELETAL:no cyanosis of digits and no clubbing  NEURO: alert & oriented x 3 with fluent speech, no focal motor/sensory deficits EXTREMITIES: No lower extremity edema BREAST: No palpable lumps or nodules in the right chest wall or axilla. No palpable lumps or nodules in the left breast. (exam performed in the presence of a chaperone)  LABORATORY DATA:  I have reviewed the data as listed   Chemistry      Component Value Date/Time   NA 136 04/12/2015 1009   NA 134 (L) 03/16/2015 1553   K 3.6 04/12/2015 1009   K 3.9 03/16/2015 1553   CL 104 04/12/2015 1009   CO2 24 04/12/2015 1009   CO2 25 03/16/2015 1553   BUN 8 04/12/2015 1009   BUN 10.3 03/16/2015 1553   CREATININE 0.72 04/12/2015 1009   CREATININE 0.8 03/16/2015 1553      Component Value Date/Time   CALCIUM 9.4 04/12/2015 1009   CALCIUM 9.6 03/16/2015 1553   ALKPHOS 125 04/12/2015 1009   ALKPHOS 135 03/16/2015 1553   AST 20 04/12/2015 1009   AST 17 03/16/2015 1553   ALT 15 04/12/2015 1009   ALT 11 03/16/2015 1553    BILITOT 0.5 04/12/2015 1009   BILITOT 0.77 03/16/2015 1553       Lab Results  Component Value Date   WBC 9.8 04/12/2015   HGB 15.0 04/12/2015   HCT 44.6 04/12/2015   MCV 87.6 04/12/2015   PLT 339 04/12/2015   NEUTROABS 5.4 04/12/2015   ASSESSMENT & PLAN:  Breast cancer of 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 Status post neoadjuvant chemotherapy with TCHPx2, GCHPx1, CHPx1, HPx2 , complete pathologic response with only residual DCIS 7 cm remaining. Anastrozole started 08/19/2014 stopped 09/18/2014, Herceptin stopped April 2016  Neuropathy: Slowly improving with time Hypokalemia: off potassium Depression: Went back to sertraline  Severe Back Pain: Cts and Bone Scan 03/20/15: Normal, she has profound arthritis and symmetrically in all joints. Patient will go back to seeing her rheumatologist Dr. Ouida Sills for her pain issues. Mammogram and ultrasound 03/12/2016: No evidence of malignancy Breast exam 03/18/2016: No palpable lumps or nodules of  concern. There is tenderness in the ribs to deep palpation.  Tamoxifen toxicities: Patient could not tolerate tamoxifen and she stopped it after taking it for 2 weeks. She reports that she caused nausea and dizziness and diffuse myalgias  Return to clinic in one year for follow-up and surveillance   No orders of the defined types were placed in this encounter.  The patient has a good understanding of the overall plan. she agrees with it. she will call with any problems that may develop before the next visit here.   Rulon Eisenmenger, MD 03/18/16

## 2016-03-19 ENCOUNTER — Ambulatory Visit: Payer: Medicaid Other | Admitting: Hematology and Oncology

## 2016-03-27 IMAGING — MR MR BREAST BILATERAL W WO CONTRAST
9 of 13 series · 32 of 48 positions shown · IV contrast (13ml Multihance)
Comparison: Prior mammograms.  12/23/2013 breast MR.

CLINICAL DATA: 58-year-old female with known right breast invasive
ductal carcinoma and DCIS. Evaluate neoadjuvant therapy.

LABS:  None obtained today.
EXAM:
BILATERAL BREAST MRI WITH AND WITHOUT CONTRAST
TECHNIQUE: Multiplanar, multisequence MR images of both breasts were obtained
prior to and following the intravenous administration of 13ml of
MultiHance.

[Series 3: T2 · axial · 3.0mm · 0.94mm/px · z∈[-116,+46]mm · 3 of 55 slices shown]
[im 1/55]
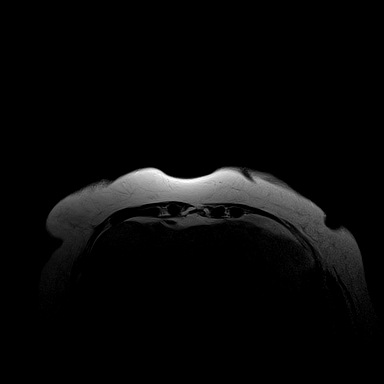
[im 28/55]
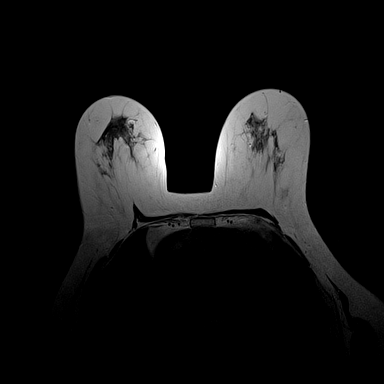
[im 55/55]
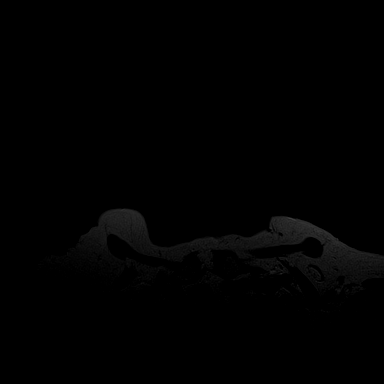

[Series 4: t2_tirm_tra ipat (a-p) · axial · 3.0mm · 0.70mm/px · z∈[-116,+46]mm · 2 of 55 slices shown]
[im 1/55]
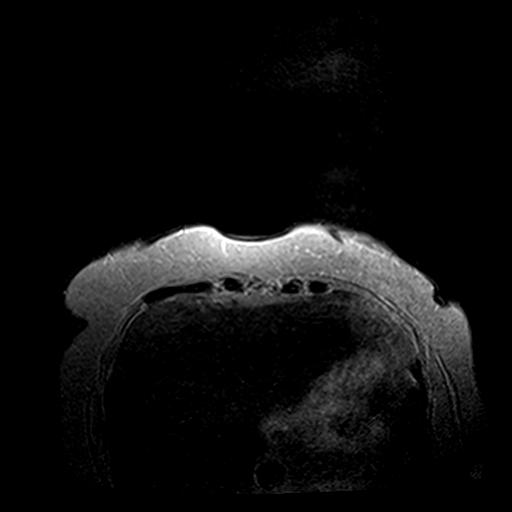
[im 55/55]
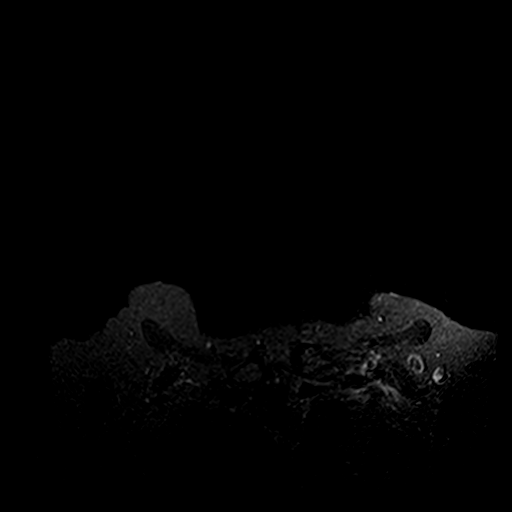

[Series 5: fl3d pre-cm no · axial · non-contrast · 1.2mm · 0.94mm/px · z∈[-120,+51]mm · 5 of 144 slices shown]
[im 1/144]
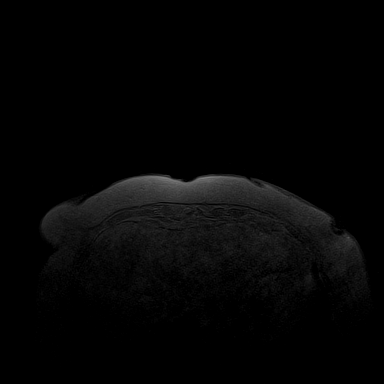
[im 36/144]
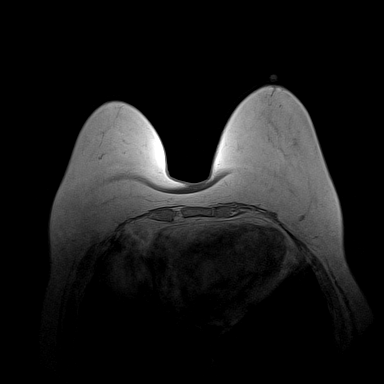
[im 72/144]
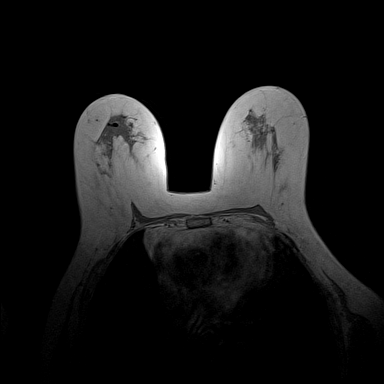
[im 108/144]
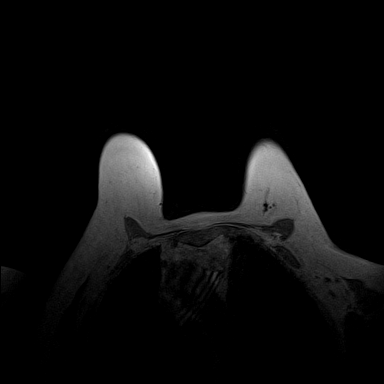
[im 144/144]
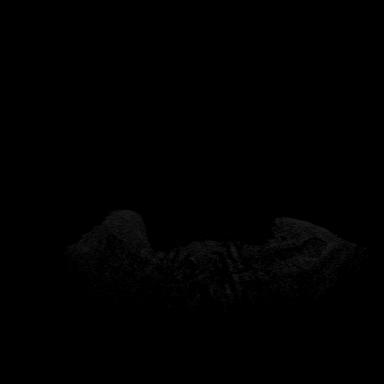

[Series 6: fl3d pre-cm · axial · non-contrast · 1.2mm · 0.94mm/px · z∈[-120,+51]mm · 5 of 144 slices shown]
[im 1/144]
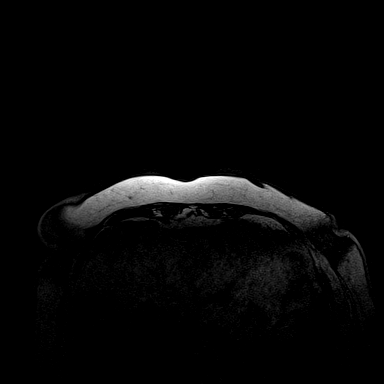
[im 36/144]
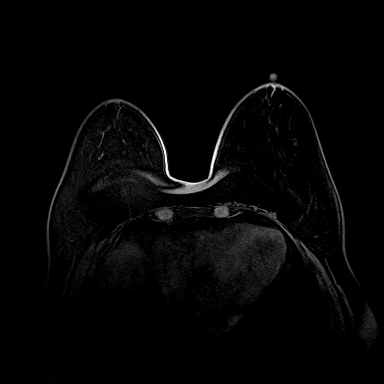
[im 72/144]
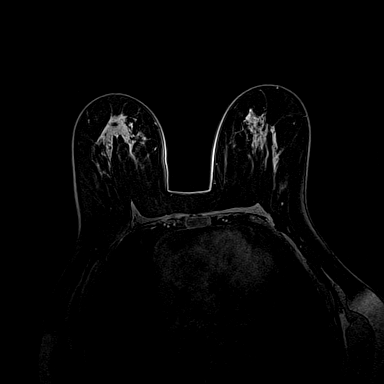
[im 108/144]
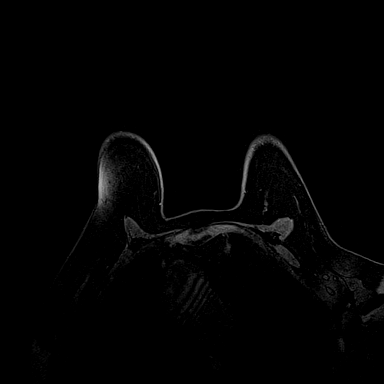
[im 144/144]
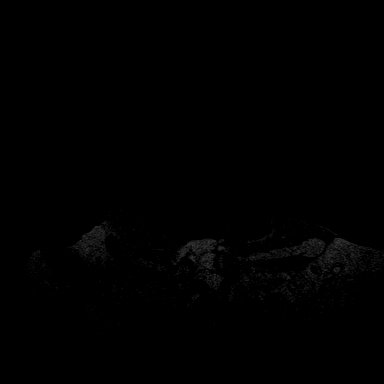

[Series 7: fl3d post-cm 20 · axial · 1.2mm · 0.94mm/px · z∈[-120,+51]mm · 5 of 144 slices shown (1 of 3)]
[im 1/144]
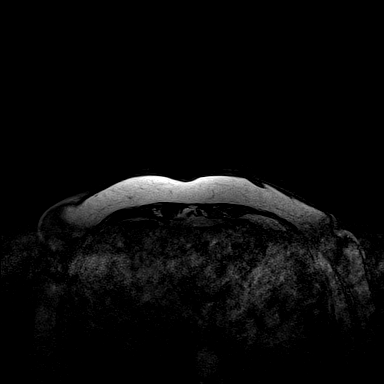
[im 36/144]
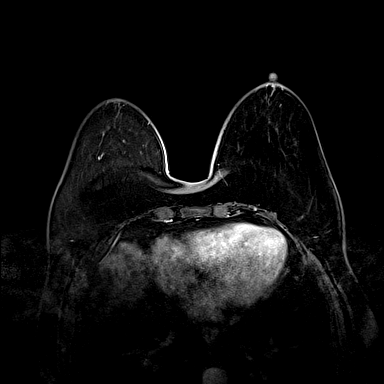
[im 72/144]
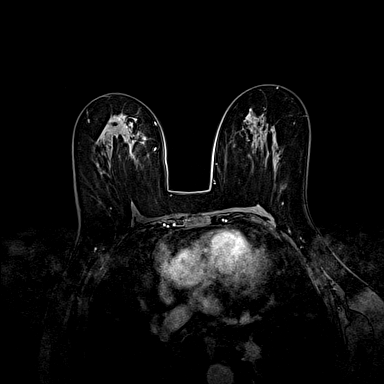
[im 108/144]
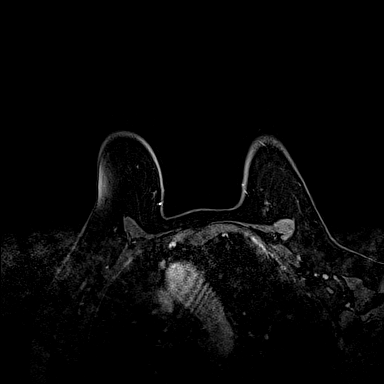
[im 144/144]
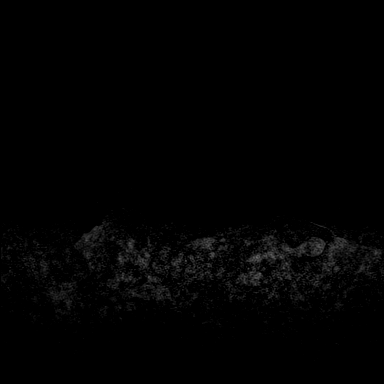

[Series 8: fl3d post-cm 20 · axial · 1.2mm · 0.94mm/px · z∈[-120,+51]mm · 5 of 144 slices shown (2 of 3)]
[im 1/144]
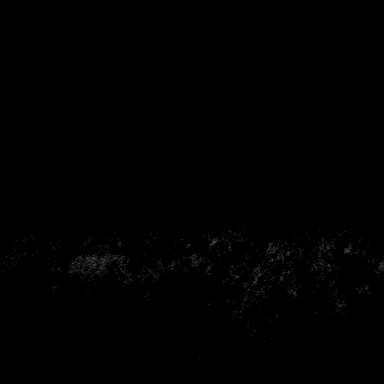
[im 36/144]
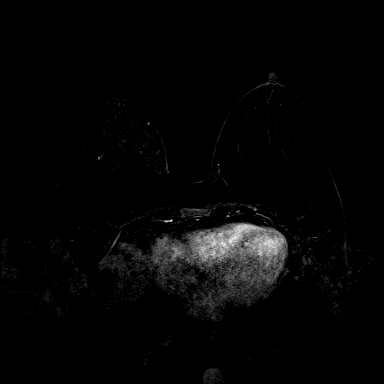
[im 72/144]
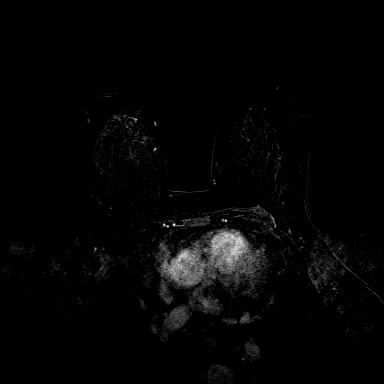
[im 108/144]
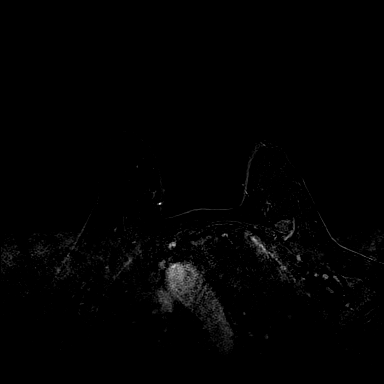
[im 144/144]
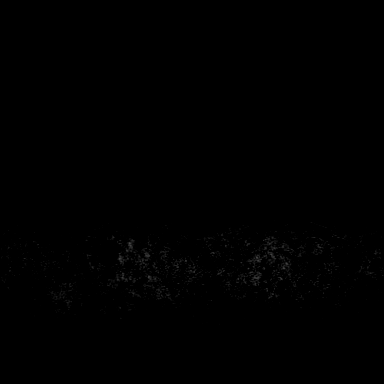

[Series 9: fl3d post-cm 20 · axial · 172.8mm · 0.94mm/px · 1 of 1 slices shown (3 of 3)]
[im 1/1]
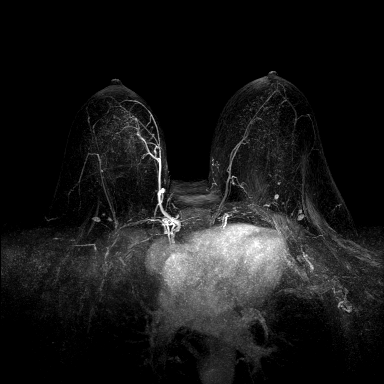

[Series 10: fl3d post-cm 3min · axial · 1.2mm · 0.94mm/px · z∈[-120,+51]mm · 5 of 144 slices shown]
[im 1/144]
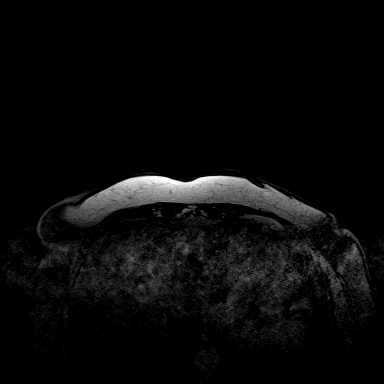
[im 36/144]
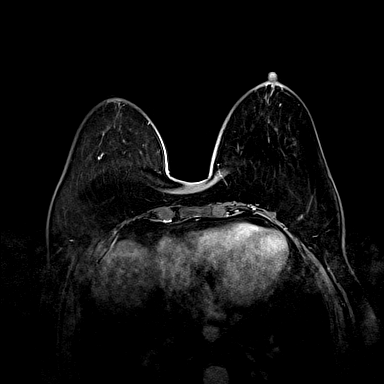
[im 72/144]
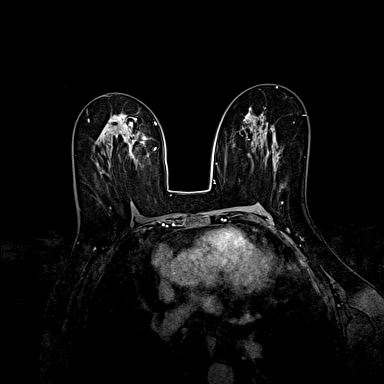
[im 108/144]
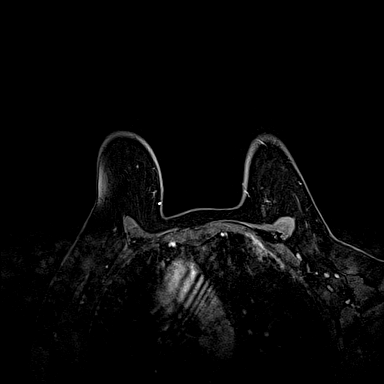
[im 144/144]
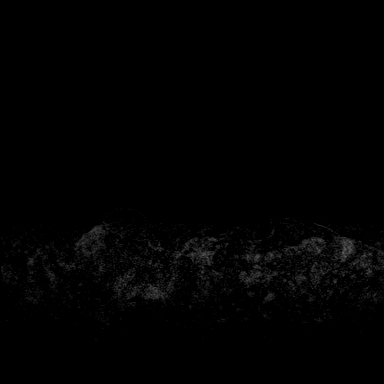

[Series 11: fl3d post-cm 3min_sub · axial · 1.2mm · 0.94mm/px · 1 of 144 slices shown]
[im 1/144]
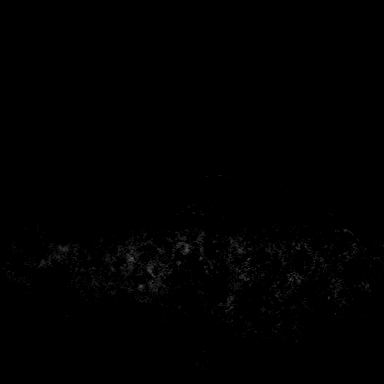

[32 of 48 positions shown; findings below may reference images not displayed]

THREE-DIMENSIONAL MR IMAGE RENDERING ON INDEPENDENT WORKSTATION:

Three-dimensional MR images were rendered by post-processing of the
original MR data on an independent workstation. The
three-dimensional MR images were interpreted, and findings are
reported in the following complete MRI report for this study. Three
dimensional images were evaluated at the independent [employer]
workstation
FINDINGS: Breast composition: c:  Heterogeneous fibroglandular tissue

Background parenchymal enhancement: Mild

Right breast: Very mild plateau type enhancement is identified
within the upper right breast, encompassing the same area of
previously identified 4.4 cm invasive malignancy. No other abnormal
areas of enhancement are identified.

Biopsy clip artifact within the upper and central right breast are
noted.

Left breast: No mass or abnormal enhancement.

Lymph nodes: No abnormal appearing lymph nodes.

Ancillary findings: An upper left chest Port-A-Cath is noted. Mild
cardiomegaly is identified.
IMPRESSION: Treatment response with very mild residual enhancement within the
upper right breast, in the area of previously identified intensely
enhancing invasive malignancy. No new or suspicious areas of
enhancement bilaterally. No enlarged or abnormal lymph nodes.

Mild cardiomegaly.

RECOMMENDATION:
Treatment plan.

BI-RADS CATEGORY  6: Known biopsy-proven malignancy.

## 2016-05-15 ENCOUNTER — Other Ambulatory Visit: Payer: Self-pay | Admitting: Nurse Practitioner

## 2016-08-27 ENCOUNTER — Telehealth: Payer: Self-pay | Admitting: *Deleted

## 2016-08-27 NOTE — Telephone Encounter (Signed)
On 08-27-16 fax medical records to [facility] law [employer] it was consult note

## 2016-09-10 ENCOUNTER — Encounter: Payer: Self-pay | Admitting: Physical Therapy

## 2016-09-10 ENCOUNTER — Ambulatory Visit: Payer: Medicaid Other | Attending: Orthopedic Surgery | Admitting: Physical Therapy

## 2016-09-10 DIAGNOSIS — M222X1 Patellofemoral disorders, right knee: Secondary | ICD-10-CM | POA: Diagnosis present

## 2016-09-10 DIAGNOSIS — M222X2 Patellofemoral disorders, left knee: Secondary | ICD-10-CM | POA: Insufficient documentation

## 2016-09-10 NOTE — Therapy (Signed)
[place], [place], [address] Phone: [phone]   Fax:  [phone]  Physical Therapy Evaluation  Patient Details  Name: [name] MRN: [id number] Date of Birth: [birth year] Referring Provider: Netta Cedars, MD  Encounter Date: 09/10/2016      PT End of Session - 09/10/16 1105    Visit Number 1   Authorization Type medicaid 1-time evaluation (non surgical)   PT Start Time 1015   PT Stop Time 1105   PT Time Calculation (min) 50 min   Activity Tolerance Patient tolerated treatment well;Patient limited by pain   Behavior During Therapy [facility] for tasks assessed/performed      Past Medical History:  Diagnosis Date  . Anxiety   . Cancer [facility])    right breast cancer  . Depression   . Fibromyalgia   . Heavy smoker   . Hepatitis C    Pt was given too much interferon - critical care x2d, 5 days total hosp  . Osteoporosis   . 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), hypoglycemia 2/04-resolved with PO calcium + D, SVT, was treated with iterferon and ribavirin-10/3-2/04  . PUD (peptic ulcer disease)   . Rheumatoid arthritis(714.0)    possible  . SVT (supraventricular tachycardia) ([place])   . Wears glasses     Past Surgical History:  Procedure Laterality Date  . ABDOMINAL HYSTERECTOMY    . CESAREAN SECTION  09/17/80  . COLONOSCOPY    . DILATION AND CURETTAGE OF UTERUS    . hysterectomy-for dysmenorrhea  09/17/00  . MASTECTOMY W/ SENTINEL NODE BIOPSY Right 07/28/2014   Procedure: RIGHT MASTECTOMY WITH SENTINEL LYMPH NODE BIOPSY ;  Surgeon: [name], MD;  Location: [place];  Service: General;  Laterality: Right;  . NASAL SINUS SURGERY  03/20/84   for deviated septum  . PORTACATH PLACEMENT Left 01/20/2014   Procedure: INSERTION PORT-A-CATH;  Surgeon: [name], MD;  Location: [place];   Service: General;  Laterality: Left;  . TONSILLECTOMY  05/20/61  . UPPER GI ENDOSCOPY      There were no vitals filed for this visit.       Subjective Assessment - 09/10/16 1213    Subjective Pt reports bilateral knee pain that usually she only feels one at a time. Difficulty standing from chair with grinding. Pt diagnosed with RA as well. Wants to know exercises she can do at home to help. Chronic back and bilateral hip pain.    Currently in Pain? Yes            [facility] PT Assessment - 09/10/16 0001      Assessment   Medical Diagnosis R knee patellofemoral OA   Referring Provider Netta Cedars, MD     Balance Screen   Has the patient fallen in the past 6 months No     [place] residence   Living Arrangements Alone     Prior Function   Level of [place]   Overall Cognitive Status Within Functional Limits for tasks assessed  pt reports memory deficits                   [facility] Adult PT Treatment/Exercise - 09/10/16 0001      Exercises   Exercises Other Exercises   Other Exercises  see scanned exercises  PT Education - 09/10/16 1216    Education provided Yes   Education Details anatomy of condition, POC, HEP, exercise form/rationale, strengthening causing soreness, soreness v pain, use of ice and heat   Person(s) Educated Patient   Methods Explanation;Demonstration;Tactile cues;Verbal cues;Handout   Comprehension Verbalized understanding;Returned demonstration;Verbal cues required;Tactile cues required;Need further instruction                    Plan - 09/10/16 1217    Clinical Impression Statement Pt presents to PT with complaints of bilateral knee pain that limits functional ability. Pt was provided with HEP exercises that she was able to demo with difficulty. Stressed importance of practicing sit<>stand regularly and how to help others should she require  assistance. Pt verbalized comfort and understanding with exercises and was instructed to contact us with any further questions. Pt denied info for pro bono clinic due to lack of transportation.   PT Treatment/Interventions ADLs/Self Care Home Management;Therapeutic exercise;Patient/family education   Consulted and Agree with Plan of Care Patient      Patient will benefit from skilled therapeutic intervention in order to improve the following deficits and impairments:  Pain, Decreased strength  Visit Diagnosis: Patellofemoral arthralgia of both knees - Plan: PT plan of care cert/re-cert     Problem List Patient Active Problem List   Diagnosis Date Noted  . Osteoarthritis 03/22/2015  . Cough   . Hypokalemia 08/15/2014  . Nausea with vomiting 08/15/2014  . Diarrhea 08/15/2014  . Flu-like symptoms 08/15/2014  . Breast cancer, female ([place]) 07/28/2014  . Chemotherapy induced nausea and vomiting 03/28/2014  . Palmar plantar erythrodysaesthesia 03/18/2014  . Rash 03/04/2014  . Bronchitis 03/04/2014  . Anorexia 03/04/2014  . Altered taste 03/04/2014  . Weight loss 03/04/2014  . Insomnia 03/04/2014  . Transaminitis 03/04/2014  . Extremity edema 02/25/2014  . Dehydration 02/25/2014  . Thrush of mouth and esophagus ([place]) 02/02/2014  . Diarrhea due to drug 02/02/2014  . Breast cancer of upper-outer quadrant of right female breast ([place]) 12/20/2013  . Rheumatoid arthritis ([place]) 04/03/2013  . TOBACCO ABUSE 06/16/2009  . PSVT 06/16/2009  . FIBROMYALGIA 06/16/2009  . HEPATITIS B 07/17/2006  . HEPATITIS C 07/17/2006  . GASTROESOPHAGEAL REFLUX, NO ESOPHAGITIS 07/17/2006    Solangel Mcmanaway C. Ladashia Demarinis PT, DPT 09/10/16 12:23 PM   [facility] [address] [place], [place], [address] Phone: [phone]   Fax:  [phone]  Name: [name] MRN: [id number] Date of Birth: 02-12-1956

## 2016-09-24 NOTE — Progress Notes (Signed)
HPI: FU SVT. She apparently had an episode in the ambulance and was documented to be as high as 200, but the strips are not available. Stress echocardiogram in October 2008 showed normal LV function and there was no ischemia. Previous monitor in October of 2008 showed sinus with occasional PACs and PVCs. Echocardiogram January 2016 showed normal LV function, moderate left ventricular hypertrophy, grade 1 diastolic dysfunction. Since I last saw her, she has occasional brief palpitations that are not sustained. She denies exertional chest pain. She has dyspnea with more vigorous activities but no orthopnea, PND or pedal edema. No syncope.  Current Outpatient Prescriptions  Medication Sig Dispense Refill  . ALPRAZolam (XANAX) 0.5 MG tablet Take 1 tablet (0.5 mg total) by mouth 3 (three) times daily. 90 tablet 0  . atenolol (TENORMIN) 50 MG tablet TAKE 1 TABLET (50 MG TOTAL) BY MOUTH 2 (TWO) TIMES DAILY. 60 tablet 9  . omeprazole (PRILOSEC) 20 MG capsule Take 20 mg by mouth 2 (two) times daily before a meal.    . oxyCODONE (OXYCONTIN) 20 mg 12 hr tablet Take 20 mg by mouth every 12 (twelve) hours.     No current facility-administered medications for this visit.      Past Medical History:  Diagnosis Date  . Anxiety   . Cancer [facility])    right breast cancer  . Depression   . Fibromyalgia   . Heavy smoker   . Hepatitis C    Pt was given too much interferon - critical care x2d, 5 days total hosp  . Osteoporosis   . 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), hypoglycemia 2/04-resolved with PO calcium + D, SVT, was treated with iterferon and ribavirin-10/3-2/04  . PUD (peptic ulcer disease)   . Rheumatoid arthritis(714.0)    possible  . SVT (supraventricular tachycardia) ([place])   . Wears glasses     Past Surgical History:  Procedure Laterality Date  . ABDOMINAL HYSTERECTOMY    . CESAREAN SECTION  09/17/80  .  COLONOSCOPY    . DILATION AND CURETTAGE OF UTERUS    . hysterectomy-for dysmenorrhea  09/17/00  . MASTECTOMY W/ SENTINEL NODE BIOPSY Right 07/28/2014   Procedure: RIGHT MASTECTOMY WITH SENTINEL LYMPH NODE BIOPSY ;  Surgeon: [name], MD;  Location: [place];  Service: General;  Laterality: Right;  . NASAL SINUS SURGERY  03/20/84   for deviated septum  . PORTACATH PLACEMENT Left 01/20/2014   Procedure: INSERTION PORT-A-CATH;  Surgeon: [name], MD;  Location: [place];  Service: General;  Laterality: Left;  . TONSILLECTOMY  05/20/61  . UPPER GI ENDOSCOPY      Social History   Social History  . Marital status: Divorced    Spouse name: N/A  . Number of children: N/A  . Years of education: N/A   Occupational History  . Not on file.   Social History Main Topics  . Smoking status: Current Every Day Smoker    Packs/day: 1.00    Years: 45.00    Types: Cigarettes  . Smokeless tobacco: Never Used     Comment: 2 ppd since 61 yo   . Alcohol use 0.0 oz/week     Comment: denies; reports 3-4 beers/night to Dr. 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 Drug use: No     Comment: had perscription drug abuse problem in 1993;  previous IVDU/inranasal cocaine in her 20s-contracted  . Sexual activity: Not Currently    Birth control/ protection: Surgical   Other Topics Concern  . Not on file   Social History Narrative   Divorced, lives with son Grayland Ormond (born 70); parents nearby. 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 Cancer Father 76       lung  . Dementia Father   . Dementia Mother   . Hypertension Mother   . Hyperlipidemia Mother   . Breast cancer Maternal Aunt   . Cancer Paternal Uncle   . Breast cancer Maternal Aunt   . Breast  cancer Maternal Aunt   . Colon cancer Neg Hx     ROS: no fevers or chills, productive cough, hemoptysis, dysphasia, odynophagia, melena, hematochezia, dysuria, hematuria, rash, seizure activity, orthopnea, PND, pedal edema, claudication. Remaining systems are negative.  Physical Exam: Well-developed well-nourished in no acute distress.  Skin is warm and dry.  HEENT is normal.  Neck is supple.  Chest mildly diminished BS Cardiovascular exam is regular rate and rhythm.  Abdominal exam nontender or distended. No masses palpated. Extremities show no edema. neuro grossly intact  ECG- sinus rhythm at a rate of 67. No ST changes. personally reviewed  A/P  1 supraventricular tachycardia-she has had brief palpitations but no sustained arrhythmias. We will continue with atenolol. If she has more frequent episodes in the future we will consider referral to electrophysiology for consideration of ablation.  2 tobacco abuse-patient counseled on discontinuing.   Kirk Ruths, MD

## 2016-09-26 ENCOUNTER — Encounter: Payer: Self-pay | Admitting: Physician Assistant

## 2016-09-26 DIAGNOSIS — M47816 Spondylosis without myelopathy or radiculopathy, lumbar region: Secondary | ICD-10-CM | POA: Insufficient documentation

## 2016-09-30 ENCOUNTER — Ambulatory Visit (INDEPENDENT_AMBULATORY_CARE_PROVIDER_SITE_OTHER): Payer: Medicaid Other | Admitting: Cardiology

## 2016-09-30 ENCOUNTER — Encounter: Payer: Self-pay | Admitting: Cardiology

## 2016-09-30 VITALS — BP 122/80 | HR 67 | Ht 64.0 in | Wt 160.0 lb

## 2016-09-30 DIAGNOSIS — F17200 Nicotine dependence, unspecified, uncomplicated: Secondary | ICD-10-CM

## 2016-09-30 DIAGNOSIS — I471 Supraventricular tachycardia: Secondary | ICD-10-CM | POA: Diagnosis not present

## 2016-09-30 DIAGNOSIS — R002 Palpitations: Secondary | ICD-10-CM | POA: Diagnosis not present

## 2016-09-30 MED ORDER — ATENOLOL 50 MG PO TABS: ORAL_TABLET | ORAL | 3 refills | Status: DC

## 2016-09-30 NOTE — Patient Instructions (Signed)
Medication Instructions:   Refill sent to the pharmacy electronically.   Follow-Up:  Your physician wants you to follow-up in: ONE YEAR WITH DR CRENSHAW You will receive a reminder letter in the mail two months in advance. If you don't receive a letter, please call our office to schedule the follow-up appointment.   If you need a refill on your cardiac medications before your next appointment, please call your pharmacy.    

## 2016-10-16 DIAGNOSIS — Z0271 Encounter for disability determination: Secondary | ICD-10-CM

## 2017-02-03 ENCOUNTER — Emergency Department (HOSPITAL_COMMUNITY)
Admission: EM | Admit: 2017-02-03 | Discharge: 2017-02-03 | Disposition: A | Discharge status: Home / Self Care | Payer: Self-pay | Attending: Emergency Medicine | Admitting: Emergency Medicine

## 2017-02-03 ENCOUNTER — Encounter (HOSPITAL_COMMUNITY): Payer: Self-pay

## 2017-02-03 DIAGNOSIS — Z79899 Other long term (current) drug therapy: Secondary | ICD-10-CM | POA: Insufficient documentation

## 2017-02-03 DIAGNOSIS — F1123 Opioid dependence with withdrawal: Secondary | ICD-10-CM

## 2017-02-03 DIAGNOSIS — R197 Diarrhea, unspecified: Secondary | ICD-10-CM

## 2017-02-03 DIAGNOSIS — F17210 Nicotine dependence, cigarettes, uncomplicated: Secondary | ICD-10-CM | POA: Insufficient documentation

## 2017-02-03 DIAGNOSIS — Z853 Personal history of malignant neoplasm of breast: Secondary | ICD-10-CM | POA: Insufficient documentation

## 2017-02-03 DIAGNOSIS — Z79891 Long term (current) use of opiate analgesic: Secondary | ICD-10-CM | POA: Insufficient documentation

## 2017-02-03 DIAGNOSIS — F1193 Opioid use, unspecified with withdrawal: Secondary | ICD-10-CM

## 2017-02-03 DIAGNOSIS — E876 Hypokalemia: Secondary | ICD-10-CM

## 2017-02-03 LAB — BASIC METABOLIC PANEL
ANION GAP: 13 (ref 5–15)
BUN: <5 mg/dL — ABNORMAL LOW (ref 6–20)
CALCIUM: 9.3 mg/dL (ref 8.9–10.3)
CO2: 26 mmol/L (ref 22–32)
Chloride: 97 mmol/L — ABNORMAL LOW (ref 101–111)
Creatinine, Ser: 0.71 mg/dL (ref 0.44–1.00)
GFR calc non Af Amer: >60 mL/min (ref >60)
Glucose, Bld: 108 mg/dL — ABNORMAL HIGH (ref 65–99)
Potassium: 3.3 mmol/L — ABNORMAL LOW (ref 3.5–5.1)
Sodium: 136 mmol/L (ref 135–145)

## 2017-02-03 LAB — CBC
HCT: 46 % (ref 36.0–46.0)
Hemoglobin: 16.1 g/dL — ABNORMAL HIGH (ref 12.0–15.0)
MCH: 31 pg (ref 26.0–34.0)
MCHC: 35 g/dL (ref 30.0–36.0)
MCV: 88.6 fL (ref 78.0–100.0)
PLATELETS: 254 10*3/uL (ref 150–400)
RBC: 5.19 MIL/uL — AB (ref 3.87–5.11)
RDW: 13.5 % (ref 11.5–15.5)
WBC: 8.9 10*3/uL (ref 4.0–10.5)

## 2017-02-03 LAB — CBG MONITORING, ED: GLUCOSE-CAPILLARY: 109 mg/dL — AB (ref 65–99)

## 2017-02-03 MED ORDER — LOPERAMIDE HCL 2 MG PO CAPS: 2.0000 mg | ORAL_CAPSULE | Freq: Four times a day (QID) | ORAL | 0 refills | Status: DC | PRN

## 2017-02-03 MED ORDER — PANTOPRAZOLE SODIUM 20 MG PO TBEC: 20.0000 mg | DELAYED_RELEASE_TABLET | Freq: Every day | ORAL | 0 refills | Status: DC

## 2017-02-03 MED ORDER — ONDANSETRON HCL 4 MG PO TABS: 4.0000 mg | ORAL_TABLET | Freq: Four times a day (QID) | ORAL | 0 refills | Status: DC

## 2017-02-03 MED ORDER — FAMOTIDINE 20 MG PO TABS
20.0000 mg | ORAL_TABLET | Freq: Once | ORAL | Status: AC
  Administered 2017-02-03: 20 mg via ORAL
  Filled 2017-02-03: qty 1

## 2017-02-03 MED ORDER — POTASSIUM CHLORIDE CRYS ER 20 MEQ PO TBCR: 20.0000 meq | EXTENDED_RELEASE_TABLET | Freq: Two times a day (BID) | ORAL | 0 refills | Status: DC

## 2017-02-03 MED ORDER — GI COCKTAIL ~~LOC~~
30.0000 mL | Freq: Once | ORAL | Status: AC
  Administered 2017-02-03: 30 mL via ORAL
  Filled 2017-02-03: qty 30

## 2017-02-03 MED ORDER — LOPERAMIDE HCL 2 MG PO CAPS
2.0000 mg | ORAL_CAPSULE | Freq: Once | ORAL | Status: AC
  Administered 2017-02-03: 2 mg via ORAL
  Filled 2017-02-03: qty 1

## 2017-02-03 MED ORDER — IBUPROFEN 600 MG PO TABS: 600.0000 mg | ORAL_TABLET | Freq: Four times a day (QID) | ORAL | 0 refills | Status: DC | PRN

## 2017-02-03 MED ORDER — POTASSIUM CHLORIDE CRYS ER 20 MEQ PO TBCR
40.0000 meq | EXTENDED_RELEASE_TABLET | Freq: Once | ORAL | Status: AC
  Administered 2017-02-03: 40 meq via ORAL
  Filled 2017-02-03: qty 2

## 2017-02-03 NOTE — ED Triage Notes (Signed)
Patient c/o near syncope, diarrhea,palpitations, and nausea. Patient states she weaned herself off of oxycontin and then has been trying to wean herself from Xanax. Patient states she has been having Near syncopal episodes, nausea, and weakness.

## 2017-02-03 NOTE — ED Provider Notes (Signed)
[facility] DEPT Provider Note   CSN: [id number] Arrival date & time: 02/03/17  1128     History   Chief Complaint Chief Complaint  Patient presents with  . Palpitations  . Diarrhea  . Near Syncope    HPI [name] is a 61 y.o. female.  HPI  61 y.o. female with a hx of Breast Cancer, Fibromyalgia, presents to the Emergency Department today due to near syncope, diarrhea, and palpitations. Pt states that for the past few days she has "weaned" herself off of oxycontin and tryng to wean herself from Xanax as well. Pt states she was taking 50mg  Oxycontin for 2 years and has since reduced herself to 15mg  Oxycodone due to medications being so expensive. Pt states she does not want to be on so many medications anymore. Notes also reducing her Xanax from 0.5mg  TID to BID for the past week. Notes generalized body aches as well as nausea and diarrhea. No fevers. No cough/congestion. No CP/SOB. Does note mild epigastric abdominal pain that has been occurring for years. Pt was on PPI, but stopped taking due to being expensive. Pt with hx hiatal hernia. No other symptoms noted.    Past Medical History:  Diagnosis Date  . Anxiety   . Cancer [facility])    right breast cancer  . Depression   . Fibromyalgia   . Heavy smoker   . Hepatitis C    Pt was given too much interferon - critical care x2d, 5 days total hosp  . Osteoporosis   . 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), hypoglycemia 2/04-resolved with PO calcium + D, SVT, was treated with iterferon and ribavirin-10/3-2/04  . PUD (peptic ulcer disease)   . Rheumatoid arthritis(714.0)    possible  . SVT (supraventricular tachycardia) ([place])   . Wears glasses     Patient Active Problem List   Diagnosis Date Noted  . Spondylosis of lumbar region without myelopathy or radiculopathy 09/26/2016  . Cervical pain 08/21/2015  . Degeneration of thoracic  intervertebral disc 08/21/2015  . Adhesive capsulitis of shoulder 08/21/2015  . Chronic pain syndrome 08/21/2015  . Osteoarthritis 03/22/2015  . Cough   . Hypokalemia 08/15/2014  . Nausea with vomiting 08/15/2014  . Diarrhea 08/15/2014  . Flu-like symptoms 08/15/2014  . Breast cancer, female ([place]) 07/28/2014  . Chemotherapy induced nausea and vomiting 03/28/2014  . Palmar plantar erythrodysaesthesia 03/18/2014  . Rash 03/04/2014  . Bronchitis 03/04/2014  . Anorexia 03/04/2014  . Altered taste 03/04/2014  . Weight loss 03/04/2014  . Insomnia 03/04/2014  . Transaminitis 03/04/2014  . Extremity edema 02/25/2014  . Dehydration 02/25/2014  . Thrush of mouth and esophagus ([place]) 02/02/2014  . Diarrhea due to drug 02/02/2014  . Breast cancer of upper-outer quadrant of right female breast ([place]) 12/20/2013  . Rheumatoid arthritis ([place]) 04/03/2013  . TOBACCO ABUSE 06/16/2009  . PSVT 06/16/2009  . FIBROMYALGIA 06/16/2009  . HEPATITIS B 07/17/2006  . HEPATITIS C 07/17/2006  . GASTROESOPHAGEAL REFLUX, NO ESOPHAGITIS 07/17/2006    Past Surgical History:  Procedure Laterality Date  . ABDOMINAL HYSTERECTOMY    . CESAREAN SECTION  09/17/80  . COLONOSCOPY    . DILATION AND CURETTAGE OF UTERUS    . hysterectomy-for dysmenorrhea  09/17/00  . MASTECTOMY W/ SENTINEL NODE BIOPSY Right 07/28/2014   Procedure: RIGHT MASTECTOMY WITH SENTINEL LYMPH NODE BIOPSY ;  Surgeon: [name], MD;  Location: [facility];  Service: General;  Laterality: Right;  . NASAL SINUS SURGERY  03/20/84   for deviated septum  . PORTACATH PLACEMENT Left 01/20/2014   Procedure: INSERTION PORT-A-CATH;  Surgeon: [name], MD;  Location: [facility];  Service: General;  Laterality: Left;  . TONSILLECTOMY  05/20/61  . UPPER GI ENDOSCOPY      OB History    Gravida Para Term Preterm AB Living   1 1 1     1    SAB TAB Ectopic Multiple Live Births                   Home Medications    Prior  to Admission medications   Medication Sig Start Date End Date Taking? Authorizing Provider  ALPRAZolam Duanne Moron) 0.5 MG tablet Take 1 tablet (0.5 mg total) by mouth 3 (three) times daily. 01/09/16   Harrison Mons, PA-C  atenolol (TENORMIN) 50 MG tablet TAKE 1 TABLET (50 MG TOTAL) BY MOUTH 2 (TWO) TIMES DAILY. 09/30/16   Lelon Perla, MD  omeprazole (PRILOSEC) 20 MG capsule Take 20 mg by mouth 2 (two) times daily before a meal.    [provider]  oxyCODONE (OXYCONTIN) 20 mg 12 hr tablet Take 20 mg by mouth every 12 (twelve) hours.    [provider]    Family History Family History  Problem Relation Age of Onset  . Cancer Father 63       lung  . Dementia Father   . Dementia Mother   . Hypertension Mother   . Hyperlipidemia Mother   . Breast cancer Maternal Aunt   . Cancer Paternal Uncle   . Breast cancer Maternal Aunt   . Breast cancer Maternal Aunt   . Colon cancer Neg Hx     Social History Social History  Substance Use Topics  . Smoking status: Current Every Day Smoker    Packs/day: 1.00    Years: 45.00    Types: Cigarettes  . Smokeless tobacco: Never Used     Comment: 2 ppd since 61 yo   . Alcohol use 0.0 oz/week     Comment: denies; reports 3-4 beers/night to Dr. Hubbard Robinson with 6 pack on weekend; hx of 12 pack/day EtOH abuse in 93      Allergies   Codeine and Lunesta [eszopiclone]   Review of Systems Review of Systems ROS reviewed and all are negative for acute change except as noted in the HPI.  Physical Exam Updated Vital Signs BP (!) 131/97 (BP Location: Left Arm)   Pulse 84   Temp 98.1 F (36.7 C) (Oral)   Resp 16   Ht 5\' 5"  (1.651 m)   Wt 74.8 kg (165 lb)   SpO2 96%   BMI 27.46 kg/m   Physical Exam  Constitutional: She is oriented to person, place, and time. Vital signs are normal. She appears well-developed and well-nourished.  HENT:  Head: Normocephalic and atraumatic.  Right Ear: Hearing normal.  Left Ear: Hearing normal.   Eyes: Pupils are equal, round, and reactive to light. Conjunctivae and EOM are normal.  Neck: Normal range of motion. Neck supple.  Cardiovascular: Normal rate, regular rhythm, normal heart sounds and intact distal pulses.   Pulmonary/Chest: Effort normal and breath sounds normal.  Abdominal: Soft. Bowel sounds are normal. There is no tenderness. There is no rigidity, no rebound, no guarding, no CVA tenderness, no tenderness at McBurney's point and negative Murphy's sign.  Musculoskeletal: Normal range of motion.  Neurological: She is alert and oriented to  person, place, and time.  Skin: Skin is warm and dry.  Psychiatric: She has a normal mood and affect. Her speech is normal and behavior is normal. Thought content normal.  Nursing note and vitals reviewed.  ED Treatments / Results  Labs (all labs ordered are listed, but only abnormal results are displayed) Labs Reviewed  BASIC METABOLIC PANEL - Abnormal; Notable for the following:       Result Value   Potassium 3.3 (*)    Chloride 97 (*)    Glucose, Bld 108 (*)    BUN <5 (*)    All other components within normal limits  CBC - Abnormal; Notable for the following:    RBC 5.19 (*)    Hemoglobin 16.1 (*)    All other components within normal limits  CBG MONITORING, ED - Abnormal; Notable for the following:    Glucose-Capillary 109 (*)    All other components within normal limits  URINALYSIS, ROUTINE W REFLEX MICROSCOPIC    EKG  EKG Interpretation None       Radiology No results found.  Procedures Procedures (including critical care time)  Medications Ordered in ED Medications  gi cocktail (Maalox,Lidocaine,Donnatal) (30 mLs Oral Given 02/03/17 1710)  potassium chloride SA (K-DUR,KLOR-CON) CR tablet 40 mEq (40 mEq Oral Given 02/03/17 1709)  famotidine (PEPCID) tablet 20 mg (20 mg Oral Given 02/03/17 1709)  loperamide (IMODIUM) capsule 2 mg (2 mg Oral Given 02/03/17 1709)     Initial Impression / Assessment and Plan /  ED Course  I have reviewed the triage vital signs and the nursing notes.  Pertinent labs & imaging results that were available during my care of the patient were reviewed by me and considered in my medical decision making (see chart for details).  Final Clinical Impressions(s) / ED Diagnoses  {I have reviewed and evaluated the relevant laboratory values. {I have reviewed and evaluated the relevant imaging studies. {I have interpreted the relevant EKG. {I have reviewed the relevant previous healthcare records.  {I obtained HPI from historian.   ED Course:  Assessment: Pt is a 61 y.o. female  with a hx of Breast Cancer, Fibromyalgia, presents to the Emergency Department today due to near syncope, diarrhea, and palpitations. Pt states that for the past few days she has "weaned" herself off of oxycontin and tryng to wean herself from Xanax as well. Pt states she was taking 50mg  Oxycontin for 2 years and has since reduced herself to 15mg  Oxycodone due to medications being so expensive. Pt states she does not want to be on so many medications anymore. Notes also reducing her Xanax from 0.5mg  TID to BID for the past week. Notes generalized body aches as well as nausea and diarrhea. No fevers. No cough/congestion. No CP/SOB. Does note mild epigastric abdominal pain that has been occurring for years. Pt was on PPI, but stopped taking due to being expensive. Pt with hx hiatal hernia. On exam, pt in NAD. Nontoxic/nonseptic appearing. VSS. Afebrile. Lungs CTA. Heart RRR. Abdomen nontender soft. Labs reassuring. Mild hypokalemia. Given Kdur. Given GI cocktail and immodium in ED. Symptoms consistent with opiate withdrawal. Mild. Close follow up with PCP. Given symptomatic management. Plan is to DC home. At time of discharge, Patient is in no acute distress. Vital Signs are stable. Patient is able to ambulate. Patient able to tolerate PO.   Disposition/Plan:  DC Home Additional Verbal discharge instructions given and  discussed with patient.  Pt Instructed to f/u with PCP in the next  week for evaluation and treatment of symptoms. Return precautions given Pt acknowledges and agrees with plan  Supervising Physician Blanchie Dessert, MD  Final diagnoses:  Opioid withdrawal ([place])  Diarrhea, unspecified type  Hypokalemia    New Prescriptions New Prescriptions   No medications on file     Shary Decamp, Hershal Coria 02/03/17 1800    Blanchie Dessert, MD 02/09/17 2033

## 2017-02-03 NOTE — Discharge Instructions (Signed)
Please read and follow all provided instructions.  Your diagnoses today include:  1. Opioid withdrawal ([place])   2. Diarrhea, unspecified type   3. Hypokalemia    Tests performed today include: Vital signs. See below for your results today.   Medications prescribed:  Take as prescribed   Home care instructions:  Follow any educational materials contained in this packet.  Follow-up instructions: Please follow-up with your primary care provider for further evaluation of symptoms and treatment   Return instructions:  Please return to the Emergency Department if you do not get better, if you get worse, or new symptoms OR  - Fever (temperature greater than 101.60F)  - Bleeding that does not stop with holding pressure to the area    -Severe pain (please note that you may be more sore the day after your accident)  - Chest Pain  - Difficulty breathing  - Severe nausea or vomiting  - Inability to tolerate food and liquids  - Passing out  - Skin becoming red around your wounds  - Change in mental status (confusion or lethargy)  - New numbness or weakness    Please return if you have any other emergent concerns.  Additional Information:  Your vital signs today were: BP (!) 128/99 (BP Location: Right Arm)    Pulse 94    Temp 98.5 F (36.9 C) (Oral)    Resp 16    Ht 5\' 5"  (1.651 m)    Wt 74.8 kg (165 lb)    SpO2 99%    BMI 27.46 kg/m  If your blood pressure (BP) was elevated above 135/85 this visit, please have this repeated by your doctor within one month. ---------------

## 2017-03-18 ENCOUNTER — Ambulatory Visit: Payer: Medicaid Other | Admitting: Hematology and Oncology

## 2019-02-03 LAB — LIPID PANEL
Cholesterol: 187 (ref 0–200)
HDL: 50 (ref 35–70)
LDL Cholesterol: 110
Triglycerides: 137 (ref 40–160)

## 2019-02-10 NOTE — Progress Notes (Signed)
Virtual Visit via Video Note changed to phone visit at pt request   This visit type was conducted due to national recommendations for restrictions regarding the COVID-19 Pandemic (e.g. social distancing) in an effort to limit this patient's exposure and mitigate transmission in our community.  Due to her co-morbid illnesses, this patient is at least at moderate risk for complications without adequate follow up.  This format is felt to be most appropriate for this patient at this time.  All issues noted in this document were discussed and addressed.  A limited physical exam was performed with this format.  Please refer to the patient's chart for her consent to telehealth for [facility].   Date:  02/12/2019   ID:  [name], [name] 1956-03-28, MRN [id number]  Patient Location:Home Provider Location: Home  PCP:  Milford Cage, PA  Cardiologist:  Dr Stanford Breed  Evaluation Performed:  Follow-Up Visit  Chief Complaint:  FU SVT  History of Present Illness:    FU SVT. She apparently had an episode in the ambulance and was documented to be as high as 200, but the strips are not available. Stress echocardiogram in October 2008 showed normal LV function and there was no ischemia. Previous monitor in October of 2008 showed sinus with occasional PACs and PVCs. Echocardiogram January 2016 showed normal LV function, moderate left ventricular hypertrophy, grade 1 diastolic dysfunction. Since I last saw her 09/30/16, some DOE; no chest pain; no episodes of SVT; occasional brief palpitations; no syncope.  The patient does not have symptoms concerning for COVID-19 infection (fever, chills, cough, or new shortness of breath).    Past Medical History:  Diagnosis Date  . Anxiety   . Cancer [facility])    right breast cancer  . Depression   . Fibromyalgia   . Heavy smoker   . Hepatitis C    Pt was given too much interferon - critical care x2d, 5 days total hosp  . Osteoporosis   . 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), hypoglycemia 2/04-resolved with PO calcium + D, SVT, was treated with iterferon and ribavirin-10/3-2/04  . PUD (peptic ulcer disease)   . Rheumatoid arthritis(714.0)    possible  . SVT (supraventricular tachycardia) ([place])   . Wears glasses    Past Surgical History:  Procedure Laterality Date  . ABDOMINAL HYSTERECTOMY    . CESAREAN SECTION  09/17/80  . COLONOSCOPY    . DILATION AND CURETTAGE OF UTERUS    . hysterectomy-for dysmenorrhea  09/17/00  . MASTECTOMY W/ SENTINEL NODE BIOPSY Right 07/28/2014   Procedure: RIGHT MASTECTOMY WITH SENTINEL LYMPH NODE BIOPSY ;  Surgeon: [name], MD;  Location: [facility];  Service: General;  Laterality: Right;  . NASAL SINUS SURGERY  03/20/84   for deviated septum  . PORTACATH PLACEMENT Left 01/20/2014   Procedure: INSERTION PORT-A-CATH;  Surgeon: [name], MD;  Location: [facility];  Service: General;  Laterality: Left;  . TONSILLECTOMY  05/20/61  . UPPER GI ENDOSCOPY       Current Meds  Medication Sig  . atenolol (TENORMIN) 50 MG tablet TAKE 1 TABLET (50 MG TOTAL) BY MOUTH 2 (TWO) TIMES DAILY.  Marland Kitchen gabapentin (NEURONTIN) 400 MG capsule Take 400 mg by mouth 3 (three) times daily.  Marland Kitchen ibuprofen (ADVIL,MOTRIN) 600 MG tablet Take 1 tablet (600 mg total) by mouth every 6 (six) hours as needed.  Marland Kitchen omeprazole (PRILOSEC) 20 MG  capsule Take 20 mg by mouth 2 (two) times daily before a meal.     Allergies:   Codeine and Lunesta [eszopiclone]   Social History   Tobacco Use  . Smoking status: Current Every Day Smoker    Packs/day: 1.00    Years: 45.00    Pack years: 45.00    Types: Cigarettes  . Smokeless tobacco: Never Used  . Tobacco comment: 2 ppd since 63 yo   Substance Use Topics  . Alcohol use: Yes    Alcohol/week: 0.0 standard drinks    Comment: denies; reports 3-4 beers/night to Dr. Hubbard Robinson with 6 pack on  weekend; hx of 12 pack/day EtOH abuse in 93   . Drug use: No    Comment: had perscription drug abuse problem in 1993; previous IVDU/inranasal cocaine in her 20s-contracted     Family Hx: The patient's family history includes Breast cancer in her maternal aunt, maternal aunt, and maternal aunt; Cancer in her paternal uncle; Cancer (age of onset: 58) in her father; Dementia in her father and mother; Hyperlipidemia in her mother; Hypertension in her mother. There is no history of Colon cancer.  ROS:   Please see the history of present illness.    No Fever, chills  or productive cough; pt does have arthralgias All other systems reviewed and are negative.   Wt Readings from Last 3 Encounters:  02/12/19 175 lb (79.4 kg)  02/03/17 165 lb (74.8 kg)  09/30/16 160 lb (72.6 kg)     Objective:    Vital Signs:  Ht 5\' 5"  (1.651 m)   Wt 175 lb (79.4 kg)   BMI 29.12 kg/m    VITAL SIGNS:  reviewed NAD Answers questions appropriately Normal affect Remainder of physical examination not performed (telehealth visit; coronavirus pandemic)  ASSESSMENT & PLAN:    1. Supraventricular tachycardia-patient has had no recurrent prolonged episodes.  Continue present dose of beta-blocker.  We can consider referral to electrophysiology for consideration of ablation in the future if she has more frequent episodes. 2. tobacco abuse-patient counseled on discontinuing; however she states she cannot quit. 3. Rheumatoid arthritis-per primary care.  COVID-19 Education: The importance of social distancing was discussed today.  Time:   Today, I have spent 17 minutes with the patient with telehealth technology discussing the above problems.     Medication Adjustments/Labs and Tests Ordered: Current medicines are reviewed at length with the patient today.  Concerns regarding medicines are outlined above.   Tests Ordered: No orders of the defined types were placed in this encounter.   Medication Changes:  No orders of the defined types were placed in this encounter.   Follow Up:  Virtual Visit or In Person in 1 year(s)  Signed, Kirk Ruths, MD  02/12/2019 7:59 AM    [place]

## 2019-02-12 ENCOUNTER — Encounter: Payer: Self-pay | Admitting: Cardiology

## 2019-02-12 ENCOUNTER — Telehealth (INDEPENDENT_AMBULATORY_CARE_PROVIDER_SITE_OTHER): Payer: Medicare Other | Admitting: Cardiology

## 2019-02-12 ENCOUNTER — Other Ambulatory Visit: Payer: Self-pay

## 2019-02-12 VITALS — Ht 65.0 in | Wt 175.0 lb

## 2019-02-12 DIAGNOSIS — I471 Supraventricular tachycardia: Secondary | ICD-10-CM | POA: Diagnosis not present

## 2019-02-12 DIAGNOSIS — R002 Palpitations: Secondary | ICD-10-CM

## 2019-02-12 DIAGNOSIS — F17200 Nicotine dependence, unspecified, uncomplicated: Secondary | ICD-10-CM | POA: Diagnosis not present

## 2019-02-12 DIAGNOSIS — M069 Rheumatoid arthritis, unspecified: Secondary | ICD-10-CM | POA: Diagnosis not present

## 2019-02-12 MED ORDER — ATENOLOL 50 MG PO TABS
ORAL_TABLET | ORAL | 3 refills | Status: DC
Start: 2019-02-12 — End: 2020-02-21

## 2019-02-12 NOTE — Patient Instructions (Signed)
Medication Instructions:  Refill sent to the pharmacy electronically.  If you need a refill on your cardiac medications before your next appointment, please call your pharmacy.   Lab work: If you have labs (blood work) drawn today and your tests are completely normal, you will receive your results only by: . MyChart Message (if you have MyChart) OR . A paper copy in the mail If you have any lab test that is abnormal or we need to change your treatment, we will call you to review the results.  Follow-Up: At [facility], you and your health needs are our priority.  As part of our continuing mission to provide you with exceptional heart care, we have created designated Provider Care Teams.  These Care Teams include your primary Cardiologist (physician) and Advanced Practice Providers (APPs -  Physician Assistants and Nurse Practitioners) who all work together to provide you with the care you need, when you need it. You will need a follow up appointment in 12 months.  Please call our office 2 months in advance to schedule this appointment.  You may see BRIAN CRENSHAW MD or one of the following Advanced Practice Providers on your designated Care Team:   Luke Kilroy, PA-C Krista Kroeger, PA-C . Callie Goodrich, PA-C     

## 2019-02-25 ENCOUNTER — Encounter: Payer: Self-pay | Admitting: Gastroenterology

## 2019-04-07 ENCOUNTER — Ambulatory Visit (INDEPENDENT_AMBULATORY_CARE_PROVIDER_SITE_OTHER): Payer: Medicare Other | Admitting: Adult Health Nurse Practitioner

## 2019-04-07 ENCOUNTER — Encounter: Payer: Self-pay | Admitting: Adult Health Nurse Practitioner

## 2019-04-07 ENCOUNTER — Other Ambulatory Visit: Payer: Self-pay

## 2019-04-07 VITALS — BP 130/80 | HR 58 | Temp 97.6°F | Resp 12 | Ht 65.0 in | Wt 175.0 lb

## 2019-04-07 DIAGNOSIS — G4700 Insomnia, unspecified: Secondary | ICD-10-CM | POA: Diagnosis not present

## 2019-04-07 DIAGNOSIS — G894 Chronic pain syndrome: Secondary | ICD-10-CM

## 2019-04-07 MED ORDER — GABAPENTIN 400 MG PO CAPS: 400.0000 mg | ORAL_CAPSULE | Freq: Three times a day (TID) | ORAL | 2 refills | Status: DC

## 2019-04-13 ENCOUNTER — Encounter: Payer: Self-pay | Admitting: *Deleted

## 2019-04-13 ENCOUNTER — Encounter: Payer: Self-pay | Admitting: Adult Health Nurse Practitioner

## 2019-04-13 NOTE — Progress Notes (Signed)
Telemedicine Encounter- SOAP NOTE Established Patient  This telephone encounter was conducted with the patient's (or proxy's) verbal consent via audio telecommunications: yes/no: Yes Patient was instructed to have this encounter in a suitably private space; and to only have persons present to whom they give permission to participate. In addition, patient identity was confirmed by use of name plus two identifiers (DOB and address).  I discussed the limitations, risks, security and privacy concerns of performing an evaluation and management service by telephone and the availability of in person appointments. I also discussed with the patient that there may be a patient responsible charge related to this service. The patient expressed understanding and agreed to proceed.  I spent a total of TIME; 0 MIN TO 60 MIN: 10 minutes talking with the patient or their proxy.  Chief Complaint  Patient presents with  . Establish Care    medication refill Rx Gabapentin    Subjective   [name] is a 63 y.o. established patient. Telephone visit today for  HPI  Patient is calling for a refill of her Gabapentin.  Helps with her radicular symptoms.   Patient Active Problem List   Diagnosis Date Noted  . Spondylosis of lumbar region without myelopathy or radiculopathy 09/26/2016  . Cervical pain 08/21/2015  . Degeneration of thoracic intervertebral disc 08/21/2015  . Adhesive capsulitis of shoulder 08/21/2015  . Chronic pain syndrome 08/21/2015  . Osteoarthritis 03/22/2015  . Cough   . Hypokalemia 08/15/2014  . Nausea with vomiting 08/15/2014  . Diarrhea 08/15/2014  . Flu-like symptoms 08/15/2014  . Breast cancer, female ([place]) 07/28/2014  . Chemotherapy induced nausea and vomiting 03/28/2014  . Palmar plantar erythrodysaesthesia 03/18/2014  . Rash 03/04/2014  . Bronchitis 03/04/2014  . Anorexia 03/04/2014  . Altered taste 03/04/2014  . Weight loss 03/04/2014  . Insomnia 03/04/2014  .  Transaminitis 03/04/2014  . Extremity edema 02/25/2014  . Dehydration 02/25/2014  . Thrush of mouth and esophagus ([place]) 02/02/2014  . Diarrhea due to drug 02/02/2014  . Breast cancer of upper-outer quadrant of right female breast ([place]) 12/20/2013  . Rheumatoid arthritis ([place]) 04/03/2013  . TOBACCO ABUSE 06/16/2009  . PSVT 06/16/2009  . FIBROMYALGIA 06/16/2009  . HEPATITIS B 07/17/2006  . HEPATITIS C 07/17/2006  . GASTROESOPHAGEAL REFLUX, NO ESOPHAGITIS 07/17/2006    Past Medical History:  Diagnosis Date  . Anxiety   . Cancer [facility]    right breast cancer  . Depression   . Fibromyalgia   . Heavy smoker   . Hepatitis C    Pt was given too much interferon - critical care x2d, 5 days total hosp  . Osteoporosis   . 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), hypoglycemia 2/04-resolved with PO calcium + D, SVT, was treated with iterferon and ribavirin-10/3-2/04  . PUD (peptic ulcer disease)   . Rheumatoid arthritis(714.0)    possible  . SVT (supraventricular tachycardia) ([place])   . Wears glasses     Current Outpatient Medications  Medication Sig Dispense Refill  . atenolol (TENORMIN) 50 MG tablet TAKE 1 TABLET (50 MG TOTAL) BY MOUTH 2 (TWO) TIMES DAILY. 180 tablet 3  . gabapentin (NEURONTIN) 400 MG capsule Take 1 capsule (400 mg total) by mouth 3 (three) times daily. 270 capsule 2  . ibuprofen (ADVIL,MOTRIN) 600 MG tablet Take 1 tablet (600 mg total) by mouth every 6 (six) hours as needed. 30 tablet 0  . methocarbamol (ROBAXIN) 500  MG tablet Take by mouth.    Marland Kitchen omeprazole (PRILOSEC) 20 MG capsule Take 20 mg by mouth 2 (two) times daily before a meal.     No current facility-administered medications for this visit.     Allergies  Allergen Reactions  . Codeine Itching and Nausea Only    Pt has had codeine with pre-administration to prevent reaction without any problem.   Johnnye Sima [Eszopiclone] Rash     Social History   Socioeconomic History  . Marital status: Divorced    Spouse name: Not on file  . Number of children: Not on file  . Years of education: Not on file  . Highest education level: Not on file  Occupational History  . Not on file  Social Needs  . Financial resource strain: Not on file  . Food insecurity    Worry: Not on file    Inability: Not on file  . Transportation needs    Medical: Not on file    Non-medical: Not on file  Tobacco Use  . Smoking status: Current Every Day Smoker    Packs/day: 1.00    Years: 45.00    Pack years: 45.00    Types: Cigarettes  . Smokeless tobacco: Never Used  . Tobacco comment: 2 ppd since 63 yo   Substance and Sexual Activity  . Alcohol use: Yes    Alcohol/week: 0.0 standard drinks    Comment: denies; reports 3-4 beers/night to Dr. 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 Drug use: No    Comment: had perscription drug abuse problem in 1993; previous IVDU/inranasal cocaine in her 20s-contracted  . Sexual activity: Not Currently    Birth control/protection: Surgical  Lifestyle  . Physical activity    Days per week: Not on file    Minutes per session: Not on file  . Stress: Not on file  Relationships  . Social Herbalist on phone: Not on file    Gets together: Not on file    Attends religious service: Not on file    Active member of club or organization: Not on file    Attends meetings of clubs or organizations: Not on file    Relationship status: Not on file  . Intimate partner violence    Fear of current or ex partner: Not on file    Emotionally abused: Not on file    Physically abused: Not on file    Forced sexual activity: Not on file  Other Topics Concern  . Not on file  Social History Narrative   Divorced, lives with son Grayland Ormond (born 68); parents nearby. 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    Hepatitis B & C   Has 2 dogs and 5  cats   Did inpatient substance abuse rehab in [place] [facility] See HPI [place]: No fevers or chills No malaise No diaphoresis Skin: No rash or itching Eyes: no blurry vision, no double vision GU: no dysuria or hematuria Neuro: no dizziness or headaches   Objective     GEN: WDWN, NAD, Non-toxic, Alert & Oriented x 3 HEENT: Atraumatic, Normocephalic.  Ears and Nose: No external deformity. EXTR: No clubbing/cyanosis/edema NEURO: Normal gait.  PSYCH: Normally interactive. Conversant. Not depressed or anxious appearing.  Calm demeanor.     Vitals as reported by the  patient: Today's Vitals   04/07/19 0835  BP: 130/80  Pulse: (!) 58  Resp: 12  Temp: 97.6 F (36.4 C)  SpO2: 95%  Weight: 175 lb (79.4 kg)  Height: 5\' 5"  (1.651 m)    Yanieliz was seen today for establish care.  Diagnoses and all orders for this visit:  Insomnia, unspecified type  Chronic pain syndrome  Other orders -     gabapentin (NEURONTIN) 400 MG capsule; Take 1 capsule (400 mg total) by mouth 3 (three) times daily.     I discussed the assessment and treatment plan with the patient. The patient was provided an opportunity to ask questions and all were answered. The patient agreed with the plan and demonstrated an understanding of the instructions.   The patient was advised to call back or seek an in-person evaluation if the symptoms worsen or if the condition fails to improve as anticipated.  I provided 10 minutes of non-face-to-face time during this encounter.  Glyn Ade, NP  Primary Care at [facility]

## 2019-12-13 ENCOUNTER — Encounter (HOSPITAL_COMMUNITY): Payer: Self-pay

## 2019-12-13 ENCOUNTER — Ambulatory Visit (HOSPITAL_COMMUNITY)
Admission: EM | Admit: 2019-12-13 | Discharge: 2019-12-13 | Disposition: A | Discharge status: Home / Self Care | Payer: Medicare Other | Attending: Emergency Medicine | Admitting: Emergency Medicine

## 2019-12-13 ENCOUNTER — Other Ambulatory Visit: Payer: Self-pay

## 2019-12-13 DIAGNOSIS — B029 Zoster without complications: Secondary | ICD-10-CM

## 2019-12-13 DIAGNOSIS — H6522 Chronic serous otitis media, left ear: Secondary | ICD-10-CM | POA: Diagnosis not present

## 2019-12-13 DIAGNOSIS — R21 Rash and other nonspecific skin eruption: Secondary | ICD-10-CM | POA: Diagnosis not present

## 2019-12-13 MED ORDER — PREDNISONE 10 MG (21) PO TBPK: ORAL_TABLET | Freq: Every day | ORAL | 0 refills | Status: DC

## 2019-12-13 MED ORDER — ACYCLOVIR 400 MG PO TABS: 400.0000 mg | ORAL_TABLET | Freq: Four times a day (QID) | ORAL | 0 refills | Status: DC

## 2019-12-13 MED ORDER — AMOXICILLIN 500 MG PO CAPS: 500.0000 mg | ORAL_CAPSULE | Freq: Three times a day (TID) | ORAL | 0 refills | Status: DC

## 2019-12-13 NOTE — Discharge Instructions (Addendum)
Take motrin as needed  Will need to follow up in 1 week if symptoms are still present We will treat for lt ear infection and possible shingles to lt side of head. You should consider an shingle vaccine and should talk to your new pcp about this.

## 2019-12-13 NOTE — ED Triage Notes (Signed)
Patient in today w/ c/o rash-like area on left side of head and left ear pain. Patient denies fever, nausea, vomiting, or diarrhea.

## 2019-12-13 NOTE — ED Provider Notes (Signed)
[name]    CSN: [id number] Arrival date & time: 12/13/19  1030      History   Chief Complaint Chief Complaint  Patient presents with  . Rash  . Otalgia    HPI [name] is a 64 y.o. female.   Pt is here due to on fri she noticed bumps and redness to lt side of ear and back side of head. States that it is tender and her glasses are rubbing on the area. No loc, lt ear pain states that she has bad sinus issues for years. Has not taken anything pta no pain to eyes or rash on the face      Past Medical History:  Diagnosis Date  . Anxiety   . Cancer [facility])    right breast cancer  . Depression   . Fibromyalgia   . Heavy smoker   . Hepatitis C    Pt was given too much interferon - critical care x2d, 5 days total hosp  . Osteoporosis   . 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), hypoglycemia 2/04-resolved with PO calcium + D, SVT, was treated with iterferon and ribavirin-10/3-2/04  . PUD (peptic ulcer disease)   . Rheumatoid arthritis(714.0)    possible  . SVT (supraventricular tachycardia) ([place])   . Wears glasses     Patient Active Problem List   Diagnosis Date Noted  . Spondylosis of lumbar region without myelopathy or radiculopathy 09/26/2016  . Cervical pain 08/21/2015  . Degeneration of thoracic intervertebral disc 08/21/2015  . Adhesive capsulitis of shoulder 08/21/2015  . Chronic pain syndrome 08/21/2015  . Osteoarthritis 03/22/2015  . Cough   . Hypokalemia 08/15/2014  . Nausea with vomiting 08/15/2014  . Diarrhea 08/15/2014  . Flu-like symptoms 08/15/2014  . Breast cancer, female ([place]) 07/28/2014  . Chemotherapy induced nausea and vomiting 03/28/2014  . Palmar plantar erythrodysaesthesia 03/18/2014  . Rash 03/04/2014  . Bronchitis 03/04/2014  . Anorexia 03/04/2014  . Altered taste 03/04/2014  . Weight loss 03/04/2014  . Insomnia 03/04/2014  . Transaminitis  03/04/2014  . Extremity edema 02/25/2014  . Dehydration 02/25/2014  . Thrush of mouth and esophagus ([name]) 02/02/2014  . Diarrhea due to drug 02/02/2014  . Breast cancer of upper-outer quadrant of right female breast ([place]) 12/20/2013  . Rheumatoid arthritis ([place]) 04/03/2013  . TOBACCO ABUSE 06/16/2009  . PSVT 06/16/2009  . FIBROMYALGIA 06/16/2009  . HEPATITIS B 07/17/2006  . HEPATITIS C 07/17/2006  . GASTROESOPHAGEAL REFLUX, NO ESOPHAGITIS 07/17/2006    Past Surgical History:  Procedure Laterality Date  . ABDOMINAL HYSTERECTOMY    . CESAREAN SECTION  09/17/80  . COLONOSCOPY    . DILATION AND CURETTAGE OF UTERUS    . hysterectomy-for dysmenorrhea  09/17/00  . MASTECTOMY W/ SENTINEL NODE BIOPSY Right 07/28/2014   Procedure: RIGHT MASTECTOMY WITH SENTINEL LYMPH NODE BIOPSY ;  Surgeon: [name], MD;  Location: [facility];  Service: General;  Laterality: Right;  . NASAL SINUS SURGERY  03/20/84   for deviated septum  . PORTACATH PLACEMENT Left 01/20/2014   Procedure: INSERTION PORT-A-CATH;  Surgeon: [name], MD;  Location: [facility];  Service: General;  Laterality: Left;  . TONSILLECTOMY  05/20/61  . UPPER GI ENDOSCOPY      OB History    Gravida  1   Para  1   Term  1   Preterm      AB  Living  1     SAB      TAB      Ectopic      Multiple      Live Births               Home Medications    Prior to Admission medications   Medication Sig Start Date End Date Taking? Authorizing Provider  atenolol (TENORMIN) 50 MG tablet TAKE 1 TABLET (50 MG TOTAL) BY MOUTH 2 (TWO) TIMES DAILY. 02/12/19  Yes Lelon Perla, MD  ibuprofen (ADVIL,MOTRIN) 600 MG tablet Take 1 tablet (600 mg total) by mouth every 6 (six) hours as needed. 02/03/17  Yes Shary Decamp, PA-C  methocarbamol (ROBAXIN) 500 MG tablet Take by mouth. 03/24/19  Yes [provider]  omeprazole (PRILOSEC) 20 MG capsule Take 20 mg by mouth 2 (two) times daily before a  meal.   Yes [provider]  acyclovir (ZOVIRAX) 400 MG tablet Take 1 tablet (400 mg total) by mouth 4 (four) times daily. 12/13/19   Marney Setting, NP  amoxicillin (AMOXIL) 500 MG capsule Take 1 capsule (500 mg total) by mouth 3 (three) times daily. 12/13/19   Marney Setting, NP  gabapentin (NEURONTIN) 400 MG capsule Take 1 capsule (400 mg total) by mouth 3 (three) times daily. 04/07/19 07/06/19  Wendall Mola, NP  predniSONE (STERAPRED UNI-PAK 21 TAB) 10 MG (21) TBPK tablet Take by mouth daily. Take 6 tabs by mouth daily  for 2 days, then 5 tabs for 2 days, then 4 tabs for 2 days, then 3 tabs for 2 days, 2 tabs for 2 days, then 1 tab by mouth daily for 2 days 12/13/19   Marney Setting, NP    Family History Family History  Problem Relation Age of Onset  . Cancer Father 36       lung  . Dementia Father   . Dementia Mother   . Hypertension Mother   . Hyperlipidemia Mother   . Breast cancer Maternal Aunt   . Cancer Paternal Uncle   . Breast cancer Maternal Aunt   . Breast cancer Maternal Aunt   . Colon cancer Neg Hx     Social History Social History   Tobacco Use  . Smoking status: Current Every Day Smoker    Packs/day: 1.00    Years: 45.00    Pack years: 45.00    Types: Cigarettes  . Smokeless tobacco: Never Used  . Tobacco comment: 2 ppd since 64 yo   Vaping Use  . Vaping Use: Never used  Substance Use Topics  . Alcohol use: Yes    Alcohol/week: 0.0 standard drinks    Comment: denies; reports 3-4 beers/night to Dr. 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 Drug use: No    Comment: had perscription drug abuse problem in 1993; previous IVDU/inranasal cocaine in her 20s-contracted     Allergies   Codeine and Lunesta [eszopiclone]   Review of Systems Review of Systems  Constitutional: Negative.   HENT: Positive for ear pain.        Lt ear   Eyes: Negative.   Respiratory: Negative.   Cardiovascular: Negative.    Musculoskeletal:       Has chronic body pain, denies anything new   Skin: Positive for rash.       Lt side of head near posterior side of ear erythema small pin point raised area and several  more to occiptal area of scalp. No open wounds, no drainage. Tenderness to touch   Neurological: Negative.      Physical Exam Triage Vital Signs ED Triage Vitals  Enc Vitals Group     BP      Pulse      Resp      Temp      Temp src      SpO2      Weight      Height      Head Circumference      Peak Flow      Pain Score      Pain Loc      Pain Edu?      Excl. in [place]?    No data found.  Updated Vital Signs BP (!) 110/86 (BP Location: Left Arm)   Pulse 64   Temp 98.8 F (37.1 C) (Oral)   Resp 18   Ht 5\' 4"  (1.626 m)   Wt 179 lb 12.8 oz (81.6 kg)   SpO2 98%   BMI 30.86 kg/m   Visual Acuity     Physical Exam HENT:     Right Ear: Tympanic membrane and external ear normal.     Ears:     Comments: Lt bulging TM, erythema ,     Nose: Nose normal.     Mouth/Throat:     Mouth: Mucous membranes are moist.  Eyes:     Pupils: Pupils are equal, round, and reactive to light.  Cardiovascular:     Rate and Rhythm: Normal rate.  Pulmonary:     Effort: Pulmonary effort is normal.  Musculoskeletal:     Cervical back: Normal range of motion.  Skin:    Findings: Erythema and rash present.     Comments: Lt side of head near posterior side of ear erythema small pin point raised area and several more to occiptal area of scalp. No open wounds, no drainage. Tenderness to touch   Neurological:     General: No focal deficit present.     Mental Status: She is alert.      UC Treatments / Results  Labs (all labs ordered are listed, but only abnormal results are displayed) Labs Reviewed - No data to display  EKG   Radiology No results found.  Procedures Procedures (including critical care time)  Medications Ordered in UC Medications - No data to display  Initial Impression /  Assessment and Plan / UC Course  I have reviewed the triage vital signs and the nursing notes.  Pertinent labs & imaging results that were available during my care of the patient were reviewed by me and considered in my medical decision making (see chart for details).     Take motrin as needed  Will need to follow up in 1 week if symptoms are still present We will treat for lt ear infection and possible shingles to lt side of head. You should consider an shingle vaccine and should talk to your new pcp about this.   Final Clinical Impressions(s) / UC Diagnoses   Final diagnoses:  Left chronic serous otitis media  Rash and nonspecific skin eruption  Herpes zoster without complication     Discharge Instructions     Take motrin as needed  Will need to follow up in 1 week if symptoms are still present We will treat for lt ear infection and possible shingles to lt side of head. You should consider an shingle vaccine and should talk  to your new pcp about this.     ED Prescriptions    Medication Sig Dispense Auth. Provider   predniSONE (STERAPRED UNI-PAK 21 TAB) 10 MG (21) TBPK tablet Take by mouth daily. Take 6 tabs by mouth daily  for 2 days, then 5 tabs for 2 days, then 4 tabs for 2 days, then 3 tabs for 2 days, 2 tabs for 2 days, then 1 tab by mouth daily for 2 days 42 tablet Morley Kos L, NP   amoxicillin (AMOXIL) 500 MG capsule Take 1 capsule (500 mg total) by mouth 3 (three) times daily. 21 capsule Morley Kos L, NP   acyclovir (ZOVIRAX) 400 MG tablet Take 1 tablet (400 mg total) by mouth 4 (four) times daily. 50 tablet Marney Setting, NP     PDMP not reviewed this encounter.   Marney Setting, NP 12/13/19 1115

## 2019-12-28 ENCOUNTER — Telehealth: Payer: Self-pay | Admitting: Registered Nurse

## 2019-12-28 NOTE — Telephone Encounter (Signed)
Pt has a TOC appt on 9.10.21 with Dr. Orland Mustard but will run out of gabapentin (NEURONTIN) 400 MG capsule Before the end of this month / Pt needs a courtesy refill until her 9.10.21 appt / please advise  Pt states she can not be without it

## 2019-12-29 MED ORDER — GABAPENTIN 400 MG PO CAPS: 400.0000 mg | ORAL_CAPSULE | Freq: Three times a day (TID) | ORAL | 0 refills | Status: DC

## 2019-12-29 NOTE — Telephone Encounter (Signed)
That's fine - I'll see her on 9/10  Thank you  Kathrin Ruddy, NP

## 2019-12-29 NOTE — Telephone Encounter (Signed)
Patient requested refill for Gabapentin 400 mg until appointment on 01/28/2020. I approved a courtesy refill for 30 days until appointment on 01/28/2020. Thank you.

## 2020-01-06 ENCOUNTER — Other Ambulatory Visit: Payer: Self-pay

## 2020-01-06 MED ORDER — GABAPENTIN 400 MG PO CAPS: 400.0000 mg | ORAL_CAPSULE | Freq: Three times a day (TID) | ORAL | 0 refills | Status: DC

## 2020-01-06 NOTE — Telephone Encounter (Signed)
Done

## 2020-01-06 NOTE — Telephone Encounter (Signed)
Pt called and asked if the refill can be sent to  [facility] [place], [place] - [address] Phone:  [phone]  Fax:  [phone]     She no longer uses the [facility]/ please switch today if possible

## 2020-01-28 ENCOUNTER — Encounter: Payer: Medicare Other | Admitting: Registered Nurse

## 2020-02-21 ENCOUNTER — Other Ambulatory Visit: Payer: Self-pay | Admitting: Cardiology

## 2020-02-21 DIAGNOSIS — R002 Palpitations: Secondary | ICD-10-CM

## 2020-02-21 MED ORDER — ATENOLOL 50 MG PO TABS: ORAL_TABLET | ORAL | 3 refills | Status: DC

## 2020-02-21 NOTE — Telephone Encounter (Signed)
*  STAT* If patient is at the pharmacy, call can be transferred to refill team.   1. Which medications need to be refilled? (please list name of each medication and dose if known) atenolol (TENORMIN) 50 MG tablet  2. Which pharmacy/location (including street and city if local pharmacy) is medication to be sent to? [facility] [phone] - [place], [place] [address]  3. Do they need a 30 day or 90 day supply? 30 day

## 2020-03-13 NOTE — Progress Notes (Signed)
HPI: FU SVT. She apparently had an episode in the ambulance and was documented to be as high as 200, but the strips are not available. Stress echocardiogram in October 2008 showed normal LV function and there was no ischemia. Previous monitor in October of 2008 showed sinus with occasional PACs and PVCs. Echocardiogram January 2016 showed normal LV function, moderate left ventricular hypertrophy, grade 1 diastolic dysfunction. Since I last saw her  she has some dyspnea on exertion.  No orthopnea, PND, pedal edema, chest pain or syncope.  No episodes of SVT.  Current Outpatient Medications  Medication Sig Dispense Refill   acyclovir (ZOVIRAX) 400 MG tablet Take 1 tablet (400 mg total) by mouth 4 (four) times daily. 50 tablet 0   atenolol (TENORMIN) 50 MG tablet TAKE 1 TABLET (50 MG TOTAL) BY MOUTH 2 (TWO) TIMES DAILY. 180 tablet 3   gabapentin (NEURONTIN) 400 MG capsule Take 1 capsule (400 mg total) by mouth 3 (three) times daily. 270 capsule 0   ibuprofen (ADVIL,MOTRIN) 600 MG tablet Take 1 tablet (600 mg total) by mouth every 6 (six) hours as needed. 30 tablet 0   methocarbamol (ROBAXIN) 500 MG tablet Take by mouth.     omeprazole (PRILOSEC) 20 MG capsule Take 20 mg by mouth 2 (two) times daily before a meal.     No current facility-administered medications for this visit.     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), hypoglycemia 2/04-resolved with PO calcium + D, SVT, was treated with iterferon and ribavirin-10/3-2/04   PUD (peptic ulcer disease)    Rheumatoid arthritis(714.0)    possible   SVT (supraventricular tachycardia) (HCC)    Wears  glasses     Past Surgical History:  Procedure Laterality Date   ABDOMINAL HYSTERECTOMY     CESAREAN SECTION  09/17/80   COLONOSCOPY     DILATION AND CURETTAGE OF UTERUS     hysterectomy-for dysmenorrhea  09/17/00   MASTECTOMY W/ SENTINEL NODE BIOPSY Right 07/28/2014   Procedure: RIGHT MASTECTOMY WITH SENTINEL LYMPH NODE BIOPSY ;  Surgeon: [name], MD;  Location: [facility];  Service: General;  Laterality: Right;   NASAL SINUS SURGERY  03/20/84   for deviated septum   PORTACATH PLACEMENT Left 01/20/2014   Procedure: INSERTION PORT-A-CATH;  Surgeon: [name], MD;  Location: [facility];  Service: General;  Laterality: Left;   TONSILLECTOMY  05/20/61   UPPER GI ENDOSCOPY      Social History   Socioeconomic History   Marital status: Divorced    Spouse name: Not on file   Number of children: Not on file   Years of education: Not on file   Highest education level: Not on file  Occupational History   Not on file  Tobacco Use   Smoking status: Current Every Day Smoker    Packs/day: 1.00    Years: 45.00    Pack years: 45.00    Types: Cigarettes   Smokeless tobacco: Never Used   Tobacco comment: 2 ppd since 64 yo   Vaping Use  Vaping Use: Never used  Substance and Sexual Activity   Alcohol use: Yes    Alcohol/week: 0.0 standard drinks    Comment: denies; reports 3-4 beers/night to Dr. Hubbard Robinson with 6 pack on weekend; hx of 12 pack/day EtOH abuse in 93    Drug use: No    Comment: had perscription drug abuse problem in 1993; previous IVDU/inranasal cocaine in her 20s-contracted   Sexual activity: Not Currently    Birth control/protection: Surgical  Other Topics Concern   Not on file  Social History Narrative   Divorced, lives with son [name] (born [birth year]); parents nearby. 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    Hepatitis B & C   Has 2 dogs and 5 cats   Did inpatient substance abuse  rehab in 1993         Social Determinants of Health   Financial Resource Strain:    Difficulty of Paying Living Expenses: Not on file  Food Insecurity:    Worried About Charity fundraiser in the Last Year: Not on file   [employer] of Food in the Last Year: Not on file  Transportation Needs:    Lack of Transportation (Medical): Not on file   Lack of Transportation (Non-Medical): Not on file  Physical Activity:    Days of Exercise per Week: Not on file   Minutes of Exercise per Session: Not on file  Stress:    Feeling of Stress : Not on file  Social Connections:    Frequency of Communication with Friends and Family: Not on file   Frequency of Social Gatherings with Friends and Family: Not on file   Attends Religious Services: Not on file   Active Member of Clubs or Organizations: Not on file   Attends Archivist Meetings: Not on file   Marital Status: Not on file  Intimate Partner Violence:    Fear of Current or Ex-Partner: Not on file   Emotionally Abused: Not on file   Physically Abused: Not on file   Sexually Abused: Not on file    Family History  Problem Relation Age of Onset   Cancer Father 38       lung   Dementia Father    Dementia Mother    Hypertension Mother    Hyperlipidemia Mother    Breast cancer Maternal Aunt    Cancer Paternal Uncle    Breast cancer Maternal Aunt    Breast cancer Maternal Aunt    Colon cancer Neg Hx     ROS: no fevers or chills, productive cough, hemoptysis, dysphasia, odynophagia, melena, hematochezia, dysuria, hematuria, rash, seizure activity, orthopnea, PND, pedal edema, claudication. Remaining systems are negative.  Physical Exam: Well-developed well-nourished in no acute distress.  Skin is warm and dry.  HEENT is normal.  Neck is supple.  Chest with diminished breath sounds. Cardiovascular exam is regular rate and rhythm.  Abdominal exam nontender or distended. No masses  palpated. Extremities show no edema. neuro grossly intact  ECG-normal sinus rhythm at a rate of 67, no ST changes.  Personally reviewed  A/P  1 SVT-patient has had no recurrent episodes by report. Continue beta-blocker. We can refer to electrophysiology in the future if she has more frequent episodes.  2 tobacco abuse-patient counseled on discontinuing.  3 rheumatoid arthritis-Per primary care.  Kirk Ruths, MD

## 2020-03-20 ENCOUNTER — Encounter: Payer: Self-pay | Admitting: Cardiology

## 2020-03-20 ENCOUNTER — Other Ambulatory Visit: Payer: Self-pay

## 2020-03-20 ENCOUNTER — Other Ambulatory Visit: Payer: Self-pay | Admitting: *Deleted

## 2020-03-20 ENCOUNTER — Ambulatory Visit (INDEPENDENT_AMBULATORY_CARE_PROVIDER_SITE_OTHER): Payer: Medicare Other | Admitting: Cardiology

## 2020-03-20 VITALS — BP 126/82 | HR 67 | Ht 65.0 in | Wt 179.6 lb

## 2020-03-20 DIAGNOSIS — I471 Supraventricular tachycardia: Secondary | ICD-10-CM

## 2020-03-20 DIAGNOSIS — R002 Palpitations: Secondary | ICD-10-CM

## 2020-03-20 DIAGNOSIS — F17200 Nicotine dependence, unspecified, uncomplicated: Secondary | ICD-10-CM | POA: Diagnosis not present

## 2020-03-20 MED ORDER — ATENOLOL 50 MG PO TABS
ORAL_TABLET | ORAL | 3 refills | Status: DC
Start: 2020-03-20 — End: 2021-01-30

## 2020-03-20 NOTE — Patient Instructions (Signed)

## 2020-03-24 ENCOUNTER — Encounter: Payer: Self-pay | Admitting: Registered Nurse

## 2020-03-24 ENCOUNTER — Ambulatory Visit (INDEPENDENT_AMBULATORY_CARE_PROVIDER_SITE_OTHER): Payer: Medicare Other | Admitting: Registered Nurse

## 2020-03-24 ENCOUNTER — Other Ambulatory Visit: Payer: Self-pay

## 2020-03-24 VITALS — BP 124/79 | HR 62 | Temp 98.1°F | Resp 18 | Ht 65.0 in | Wt 177.6 lb

## 2020-03-24 DIAGNOSIS — Z7689 Persons encountering health services in other specified circumstances: Secondary | ICD-10-CM

## 2020-03-24 DIAGNOSIS — G894 Chronic pain syndrome: Secondary | ICD-10-CM

## 2020-03-24 DIAGNOSIS — Z8639 Personal history of other endocrine, nutritional and metabolic disease: Secondary | ICD-10-CM

## 2020-03-24 DIAGNOSIS — Z13220 Encounter for screening for lipoid disorders: Secondary | ICD-10-CM

## 2020-03-24 DIAGNOSIS — Z1329 Encounter for screening for other suspected endocrine disorder: Secondary | ICD-10-CM | POA: Diagnosis not present

## 2020-03-24 DIAGNOSIS — Z130 Encounter for screening for diseases of the blood and blood-forming organs and certain disorders involving the immune mechanism: Secondary | ICD-10-CM

## 2020-03-24 DIAGNOSIS — M069 Rheumatoid arthritis, unspecified: Secondary | ICD-10-CM

## 2020-03-24 DIAGNOSIS — R6889 Other general symptoms and signs: Secondary | ICD-10-CM

## 2020-03-24 DIAGNOSIS — M792 Neuralgia and neuritis, unspecified: Secondary | ICD-10-CM

## 2020-03-24 DIAGNOSIS — Z13228 Encounter for screening for other metabolic disorders: Secondary | ICD-10-CM

## 2020-03-24 DIAGNOSIS — E559 Vitamin D deficiency, unspecified: Secondary | ICD-10-CM

## 2020-03-24 LAB — COMPREHENSIVE METABOLIC PANEL
AST: 14 IU/L (ref 0–40)
Alkaline Phosphatase: 147 IU/L — ABNORMAL HIGH (ref 44–121)
BUN: 11 mg/dL (ref 8–27)

## 2020-03-24 LAB — HEMOGLOBIN A1C

## 2020-03-24 LAB — THYROID PANEL WITH TSH

## 2020-03-24 LAB — CBC WITH DIFFERENTIAL
Eos: 2 %
Lymphs: 34 %
MCH: 27.5 pg (ref 26.6–33.0)

## 2020-03-24 MED ORDER — GABAPENTIN 400 MG PO CAPS: 400.0000 mg | ORAL_CAPSULE | Freq: Three times a day (TID) | ORAL | 3 refills | Status: DC

## 2020-03-24 MED FILL — GABAPENTIN 400 MG CAPSULE: 400 | 90 days supply | Qty: 270 | Fill #0

## 2020-03-24 NOTE — Patient Instructions (Signed)
° ° ° °  If you have lab work done today you will be contacted with your lab results within the next 2 weeks.  If you have not heard from us then please contact us. The fastest way to get your results is to register for My Chart. ° ° °IF you received an x-ray today, you will receive an invoice from [facility]. Please contact [facility] at [phone] with questions or concerns regarding your invoice.  ° °IF you received labwork today, you will receive an invoice from [facility]. Please contact [facility] at [phone] with questions or concerns regarding your invoice.  ° °Our billing staff will not be able to assist you with questions regarding bills from these companies. ° °You will be contacted with the lab results as soon as they are available. The fastest way to get your results is to activate your My Chart account. Instructions are located on the last page of this paperwork. If you have not heard from us regarding the results in 2 weeks, please contact this office. °  ° ° ° °

## 2020-03-25 ENCOUNTER — Encounter: Payer: Self-pay | Admitting: Registered Nurse

## 2020-03-25 LAB — COMPREHENSIVE METABOLIC PANEL
ALT: 14 IU/L (ref 0–32)
Albumin/Globulin Ratio: 1.3 (ref 1.2–2.2)
Albumin: 4.4 g/dL (ref 3.8–4.8)
BUN/Creatinine Ratio: 13 (ref 12–28)
Bilirubin Total: 0.3 mg/dL (ref 0.0–1.2)
CO2: 19 mmol/L — ABNORMAL LOW (ref 20–29)
Calcium: 9.3 mg/dL (ref 8.7–10.3)
Chloride: 97 mmol/L (ref 96–106)
Creatinine, Ser: 0.86 mg/dL (ref 0.57–1.00)
GFR calc Af Amer: 83 mL/min/{1.73_m2} (ref >59)
GFR calc non Af Amer: 72 mL/min/{1.73_m2} (ref >59)
Globulin, Total: 3.3 g/dL (ref 1.5–4.5)
Glucose: 101 mg/dL — ABNORMAL HIGH (ref 65–99)
Potassium: 4.8 mmol/L (ref 3.5–5.2)
Sodium: 131 mmol/L — ABNORMAL LOW (ref 134–144)
Total Protein: 7.7 g/dL (ref 6.0–8.5)

## 2020-03-25 LAB — CBC WITH DIFFERENTIAL
Basophils Absolute: 0 10*3/uL (ref 0.0–0.2)
Basos: 0 %
EOS (ABSOLUTE): 0.2 10*3/uL (ref 0.0–0.4)
Hematocrit: 44.1 % (ref 34.0–46.6)
Hemoglobin: 14.5 g/dL (ref 11.1–15.9)
Immature Grans (Abs): 0 10*3/uL (ref 0.0–0.1)
Immature Granulocytes: 1 %
Lymphocytes Absolute: 2.9 10*3/uL (ref 0.7–3.1)
MCHC: 32.9 g/dL (ref 31.5–35.7)
MCV: 84 fL (ref 79–97)
Monocytes Absolute: 0.6 10*3/uL (ref 0.1–0.9)
Monocytes: 7 %
Neutrophils Absolute: 4.9 10*3/uL (ref 1.4–7.0)
Neutrophils: 56 %
RBC: 5.27 x10E6/uL (ref 3.77–5.28)
RDW: 13.5 % (ref 11.7–15.4)
WBC: 8.6 10*3/uL (ref 3.4–10.8)

## 2020-03-25 LAB — VITAMIN B12: Vitamin B-12: 368 pg/mL (ref 232–1245)

## 2020-03-25 LAB — THYROID PANEL WITH TSH
Free Thyroxine Index: 1.8 (ref 1.2–4.9)
TSH: 1.23 u[IU]/mL (ref 0.450–4.500)

## 2020-03-25 LAB — C-REACTIVE PROTEIN: CRP: 4 mg/L (ref 0–10)

## 2020-03-25 LAB — VITAMIN D 25 HYDROXY (VIT D DEFICIENCY, FRACTURES): Vit D, 25-Hydroxy: 30.3 ng/mL (ref 30.0–100.0)

## 2020-03-25 NOTE — Progress Notes (Signed)
Established Patient Office Visit  Subjective:  Patient ID: [name], female    DOB: [birth year]  Age: 64 y.o. MRN: [id number]  CC:  Chief Complaint  Patient presents with  . Transitions Of Care    Patients states she is here for TOC. Patient would also like to to discuss anemia   . Medication Refill    gabapentin    HPI [name] presents for visit to est care.  Has been seen by multiple providers in this office in the past, most recently Jens Som.   She needs a refill on gabapentin for chronic pain. 422m po tid. Generally well controlled. Has polyarthritic arthritis that occ flares but she is able to recognize these and take OTCs to supplement gabapentin with good relief. Is interested in discussing dosing options as she fears her pain may worsen in the future.   Hx of anemia. Notes that she has been feeling particularly warm lately - not normal for her, usually cold all the time. Unsure of why this is happening. She is long post menopausal. No other symptoms of note.  Overall a very pleasant woman.  Past Medical History:  Diagnosis Date  . Allergy    Phreesia 03/21/2020  . Anemia    Phreesia 03/21/2020  . Anxiety   . Arthritis    Phreesia 03/21/2020  . Blood transfusion without reported diagnosis    Phreesia 03/21/2020  . Cancer ([facility]    right breast cancer  . Depression   . Depression    Phreesia 03/21/2020  . Fibromyalgia   . GERD (gastroesophageal reflux disease)    Phreesia 03/21/2020  . Heavy smoker   . Hepatitis C    Pt was given too much interferon - critical care x2d, 5 days total hosp  . [place]romuscular [place]    Phreesia 03/21/2020  . Osteoporosis   . 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), hypoglycemia 2/04-resolved with PO calcium + D, SVT, was treated with iterferon and ribavirin-10/3-2/04  . PUD (peptic ulcer disease)   . Rheumatoid arthritis(714.0)      possible  . [place]ricular [place]) ([place]   . Wears glasses     Past Surgical History:  Procedure Laterality Date  . ABDOMINAL HYSTERECTOMY    . BREAST SURGERY N/A    Phreesia 03/21/2020  . CESAREAN SECTION  09/17/80  . CESAREAN SECTION N/A    Phreesia 03/21/2020  . COLONOSCOPY    . DILATION AND CURETTAGE OF UTERUS    . hysterectomy-for dysmenorrhea  09/17/00  . JOINT REPLACEMENT N/A    Phreesia 03/21/2020  . MASTECTOMY W/ SENTINEL NODE BIOPSY Right 07/28/2014   Procedure: RIGHT MASTECTOMY WITH SENTINEL LYMPH NODE BIOPSY ;  Surgeon: FStark Klein MD;  [place] [facility]  Service: General;  Laterality: Right;  . NASAL SINUS SURGERY  03/20/84   for deviated septum  . PORTACATH PLACEMENT Left 01/20/2014   Procedure: INSERTION PORT-A-CATH;  Surgeon: FStark Klein MD;  [place]  Service: General;  Laterality: Left;  . TONSILLECTOMY  05/20/61  . UPPER GI ENDOSCOPY      Family History  Problem Relation Age of Onset  . Cancer Father 861      lung  . Dementia Father   . Dementia Mother   . Hypertension Mother   . Hyperlipidemia Mother   . Breast cancer Maternal Aunt   . Cancer Paternal Uncle   .  Breast cancer Maternal Aunt   . Breast cancer Maternal Aunt   . Colon cancer Neg Hx     Social History   Socioeconomic History  . Marital status: Divorced    Spouse name: Not on file  . Number of children: Not on file  . Years of education: Not on file  . Highest education level: Not on file  Occupational History  . Not on file  Tobacco Use  . Smoking status: Current Every Day Smoker    Packs/day: 1.00    Years: 45.00    Pack years: 45.00    Types: Cigarettes  . Smokeless tobacco: Never Used  . Tobacco comment: 2 ppd since 64 yo   Vaping Use  . Vaping Use: Never used  Substance and Sexual Activity  . Alcohol use: Yes    Alcohol/week: 0.0 standard drinks    Comment: denies; reports 3-4 beers/night to Dr. Hubbard Robinson with 6  pack on weekend; hx of 12 pack/day EtOH abuse in 93   . Drug use: No    Comment: had perscription drug abuse problem in 1993; previous IVDU/inranasal cocaine in her 20s-contracted  . Sexual activity: Not Currently    Birth control/protection: Surgical  Other Topics Concern  . Not on file  Social History Narrative   Divorced, lives with son Grayland Ormond (born [birth year]); parents nearby. 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 Difficulty of Paying Living Expenses: Not on file  Food Insecurity:   . Worried About Charity fundraiser in the Last Year: Not on file  . Ran Out of Food in the Last Year: Not on file  Transportation Needs:   . Lack of Transportation (Medical): Not on file  . Lack of Transportation (Non-Medical): Not on file  Physical Activity:   . Days of Exercise per Week: Not on file  . Minutes of Exercise per Session: Not on file  Stress:   . Feeling of Stress : Not on file  Social Connections:   . Frequency of Communication with Friends and Family: Not on file  . Frequency of Social Gatherings with Friends and Family: Not on file  . Attends Religious Services: Not on file  . Active Member of Clubs or Organizations: Not on file  . Attends Archivist Meetings: Not on file  . Marital Status: Not on file  Intimate Partner Violence:   . Fear of Current or Ex-Partner: Not on file  . Emotionally Abused: Not on file  . Physically Abused: Not on file  . Sexually Abused: Not on file    Outpatient Medications Prior to Visit  Medication Sig Dispense Refill  . atenolol (TENORMIN) 50 MG tablet TAKE 1 TABLET (50 MG TOTAL) BY MOUTH 2 (TWO) TIMES DAILY. 180 tablet 3  . ibuprofen (ADVIL,MOTRIN) 600 MG tablet Take 1 tablet (600 mg total) by mouth every 6 (six) hours as needed. 30 tablet  0  . methocarbamol (ROBAXIN) 500 MG tablet Take by mouth.    Marland Kitchen omeprazole (PRILOSEC) 20 MG capsule Take 20 mg by mouth 2 (two) times daily before a meal.    . acyclovir (ZOVIRAX) 400 MG tablet Take 1 tablet (400 mg total) by mouth 4 (four) times daily. (  Patient not taking: Reported on 03/24/2020) 50 tablet 0  . gabapentin (NEURONTIN) 400 MG capsule Take 1 capsule (400 mg total) by mouth 3 (three) times daily. 270 capsule 0   No facility-administered medications prior to visit.    Allergies  Allergen Reactions  . Codeine Itching and Nausea Only    Pt has had codeine with pre-administration to prevent reaction without any problem.   Johnnye Sima [Eszopiclone] Rash    ROS Review of Systems  Constitutional: Negative.   HENT: Negative.   Eyes: Negative.   Respiratory: Negative.   Cardiovascular: Negative.   Gastrointestinal: Negative.   Genitourinary: Negative.   Musculoskeletal: Negative.   Skin: Negative.   Neurological: Negative.   Psychiatric/Behavioral: Negative.       Objective:    Physical Exam Vitals and nursing note reviewed.  Constitutional:      General: She is not in acute distress.    Appearance: Normal appearance. She is normal weight. She is not ill-appearing, toxic-appearing or diaphoretic.  Cardiovascular:     Rate and Rhythm: Normal rate and regular rhythm.     Heart sounds: Normal heart sounds. No murmur heard.  No friction rub. No gallop.   Pulmonary:     Effort: Pulmonary effort is normal. No respiratory distress.     Breath sounds: Normal breath sounds. No stridor. No wheezing, rhonchi or rales.  Chest:     Chest wall: No tenderness.  Skin:    General: Skin is warm and dry.  Neurological:     General: No focal deficit present.     Mental Status: She is alert and oriented to person, place, and time. Mental status is at baseline.  Psychiatric:        Mood and Affect: Mood normal.        Behavior: Behavior normal.        Thought Content: Thought  content normal.        Judgment: Judgment normal.     BP 124/79   Pulse 62   Temp 98.1 F (36.7 C) (Temporal)   Resp 18   Ht 5' 5"  (1.651 m)   Wt 177 lb 9.6 oz (80.6 kg)   SpO2 97%   BMI 29.55 kg/m  Wt Readings from Last 3 Encounters:  03/24/20 177 lb 9.6 oz (80.6 kg)  03/20/20 179 lb 9.6 oz (81.5 kg)  12/13/19 179 lb 12.8 oz (81.6 kg)     Health Maintenance Due  Topic Date Due  . PAP SMEAR-Modifier  12/14/2016    There are no preventive care reminders to display for this patient.  Lab Results  Component Value Date   TSH 1.230 03/24/2020   Lab Results  Component Value Date   WBC 8.6 03/24/2020   HGB 14.5 03/24/2020   HCT 44.1 03/24/2020   MCV 84 03/24/2020   PLT 254 02/03/2017   Lab Results  Component Value Date   NA 131 (L) 03/24/2020   K 4.8 03/24/2020   CHLORIDE 101 03/16/2015   CO2 19 (L) 03/24/2020   GLUCOSE 101 (H) 03/24/2020   BUN 11 03/24/2020   CREATININE 0.86 03/24/2020   BILITOT 0.3 03/24/2020   ALKPHOS 147 (H) 03/24/2020   AST 14 03/24/2020   ALT 14 03/24/2020   PROT 7.7 03/24/2020   ALBUMIN 4.4 03/24/2020   CALCIUM 9.3 03/24/2020   ANIONGAP 13 02/03/2017   EGFR 83 (L) 03/16/2015   Lab Results  Component Value Date   CHOL 187 02/03/2019   Lab Results  Component Value Date   HDL 50 02/03/2019   Lab Results  Component Value Date   LDLCALC 110 02/03/2019   Lab Results  Component Value Date   TRIG 137 02/03/2019   No results found for: CHOLHDL Lab Results  Component Value Date   HGBA1C 5.8 (H) 03/24/2020      Assessment & Plan:   Problem List Items Addressed This Visit      Musculoskeletal and Integument   Rheumatoid arthritis ([place])   Relevant Orders   Thyroid Panel With TSH (Completed)   CBC With Differential (Completed)   Comprehensive metabolic panel (Completed)     Other   Chronic pain syndrome (Chronic)   Relevant Medications   gabapentin (NEURONTIN) 400 MG capsule    Other Visit Diagnoses    Encounter to  establish care    -  Primary   Heat intolerance       Relevant Orders   C-reactive protein (Completed)   Screening for endocrine, metabolic and immunity disorder       Relevant Orders   Hemoglobin A1c (Completed)   Vitamin D, 25-hydroxy (Completed)   Vitamin B12 (Completed)   Lipid screening       Relevant Orders   Lipid panel   History of elevated glucose       Relevant Orders   Hemoglobin A1c (Completed)   History of vitamin D deficiency       Relevant Orders   Vitamin D, 25-hydroxy (Completed)   Vitamin D deficiency       Relevant Orders   Vitamin D, 25-hydroxy (Completed)   Neuropathic pain       Relevant Orders   Vitamin B12 (Completed)   Other general symptoms and signs       Relevant Orders   Vitamin B12 (Completed)      Meds ordered this encounter  Medications  . gabapentin (NEURONTIN) 400 MG capsule    Sig: Take 1 capsule (400 mg total) by mouth 3 (three) times daily.    Dispense:  270 capsule    Refill:  3    Order Specific Question:   Supervising Provider    Answer:   Carlota Raspberry, JEFFREY R [[id number]]    Follow-up: No follow-ups on file.   PLAN  Refill gabapentin  Labs collected, will follow up as warranted  Unsure of cause of heat intolerance - will check thyroid and vitamin levels  Return in 3 - 6 months  Patient encouraged to call clinic with any questions, comments, or concerns.  Maximiano Coss, NP

## 2020-03-29 ENCOUNTER — Telehealth: Payer: Self-pay | Admitting: Registered Nurse

## 2020-03-29 NOTE — Telephone Encounter (Signed)
Patient is calling and would like a call with her most recent lab results

## 2020-03-30 NOTE — Telephone Encounter (Signed)
Will call once they have been reviewed

## 2020-04-01 ENCOUNTER — Encounter: Payer: Self-pay | Admitting: Registered Nurse

## 2020-04-03 ENCOUNTER — Other Ambulatory Visit: Payer: Self-pay | Admitting: Registered Nurse

## 2020-04-03 DIAGNOSIS — G894 Chronic pain syndrome: Secondary | ICD-10-CM

## 2020-04-03 NOTE — Telephone Encounter (Signed)
Pt called saying her Gabapentin was sent to [facility] outpatient and she needed it sent to [facility]'  CB# [phone]

## 2021-01-29 ENCOUNTER — Other Ambulatory Visit: Payer: Self-pay | Admitting: Registered Nurse

## 2021-01-29 ENCOUNTER — Other Ambulatory Visit: Payer: Self-pay | Admitting: Cardiology

## 2021-01-29 DIAGNOSIS — R002 Palpitations: Secondary | ICD-10-CM

## 2021-01-29 DIAGNOSIS — G894 Chronic pain syndrome: Secondary | ICD-10-CM

## 2021-02-26 ENCOUNTER — Telehealth: Payer: Self-pay | Admitting: Hematology and Oncology

## 2021-02-26 NOTE — Telephone Encounter (Signed)
Scheduled appt per 10/7 referral msg. Pt aware.

## 2021-03-20 NOTE — Progress Notes (Signed)
Cardiology Office Note:    Date:  03/21/2021   ID:  [name], Guidry Nov 08, 1955, MRN [id number]  PCP:  Elinor Parkinson [place] Cardiologist: Kirk Ruths, MD   Reason for visit: 1 year follow-up  History of Present Illness:    [name] is a 65 y.o. female with a hx of reported SVT with heart rate up to 200, tobacco use, rheumatoid arthritis.  She saw Dr. Stanford Breed in 03/2020 for follow-up SVT.  Patient mentions some dyspnea on exertion.  No chest pain or further episodes of SVT.  Today, she states she is recovering from back surgery where they burned her nerves.  She has had severe pain following this.  They initially had her on baclofen which increased her palpitations.  She stopped baclofen.  She states rare episodes of SVT, she may go 6 months without SVT episodes.  Sometimes she has skipped beats.  She states a Holter monitor has never been able to pick up her arrhythmias.  Her palpitations are mostly stress-induced.  She is still smoking and states it is a vice she is unable to give up with her anxiety and pain.  Her shortness of breath has been stable.  She denies chest pressure.  Denies leg swelling.  She states her blood pressure is usually low.  She mentions hesitation in cholesterol medicine secondary to concern it may increase her risk of dementia which runs in her family.    Past Medical History:  Diagnosis Date   Allergy    Phreesia 03/21/2020   Anemia    Phreesia 03/21/2020   Anxiety    Arthritis    Phreesia 03/21/2020   Blood transfusion without reported diagnosis    Phreesia 03/21/2020   Cancer ([place])    right breast cancer   Depression    Depression    Phreesia 03/21/2020   Fibromyalgia    GERD (gastroesophageal reflux disease)    Phreesia 03/21/2020   Heavy smoker    Hepatitis C    Pt was given too much interferon - critical care x2d, 5 days total hosp   Neuromuscular disorder ([place])    Phreesia 03/21/2020   Osteoporosis     Perimenopausal 7/05   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), hypoglycemia 2/04-resolved with PO calcium + D, SVT, was treated with iterferon and ribavirin-10/3-2/04   PUD (peptic ulcer disease)    Rheumatoid arthritis(714.0)    possible   SVT (supraventricular tachycardia) (HCC)    Wears glasses     Past Surgical History:  Procedure Laterality Date   ABDOMINAL HYSTERECTOMY     BREAST SURGERY N/A    Phreesia 03/21/2020   CESAREAN SECTION  09/17/80   CESAREAN SECTION N/A    Phreesia 03/21/2020   COLONOSCOPY     DILATION AND CURETTAGE OF UTERUS     hysterectomy-for dysmenorrhea  09/17/00   JOINT REPLACEMENT N/A    Phreesia 03/21/2020   MASTECTOMY W/ SENTINEL NODE BIOPSY Right 07/28/2014   Procedure: RIGHT MASTECTOMY WITH SENTINEL LYMPH NODE BIOPSY ;  Surgeon: [name], MD;  Location: [facility];  Service: General;  Laterality: Right;   NASAL SINUS SURGERY  03/20/84   for deviated septum   PORTACATH PLACEMENT Left 01/20/2014   Procedure: INSERTION PORT-A-CATH;  Surgeon: [name], MD;  Location: [facility];  Service: General;  Laterality: Left;   TONSILLECTOMY  05/20/61   UPPER GI ENDOSCOPY  Current Medications: Current Meds  Medication Sig   acyclovir (ZOVIRAX) 400 MG tablet Take 1 tablet (400 mg total) by mouth 4 (four) times daily.   gabapentin (NEURONTIN) 400 MG capsule TAKE 1 CAPSULE(400 MG) BY MOUTH THREE TIMES DAILY   gabapentin (NEURONTIN) 600 MG tablet Take by mouth.   ibuprofen (ADVIL,MOTRIN) 600 MG tablet Take 1 tablet (600 mg total) by mouth every 6 (six) hours as needed.   methocarbamol (ROBAXIN) 500 MG tablet Take by mouth.   omeprazole (PRILOSEC) 20 MG capsule Take 20 mg by mouth 2 (two) times daily before a meal.   oxycodone (OXY-IR) 5 MG capsule Take 5 mg by mouth every 4 (four) hours as needed.   rosuvastatin (CRESTOR) 10 MG tablet Take 1 tablet (10 mg total) by mouth  daily.   [DISCONTINUED] atenolol (TENORMIN) 50 MG tablet TAKE 1 TABLET(50 MG) BY MOUTH TWICE DAILY     Allergies:   Codeine and Lunesta [eszopiclone]   Social History   Socioeconomic History   Marital status: Divorced    Spouse name: Not on file   Number of children: Not on file   Years of education: Not on file   Highest education level: Not on file  Occupational History   Occupation: disability  Tobacco Use   Smoking status: Every Day    Packs/day: 1.00    Years: 45.00    Pack years: 45.00    Types: Cigarettes   Smokeless tobacco: Never   Tobacco comments:    2 ppd since 65 yo   Vaping Use   Vaping Use: Never used  Substance and Sexual Activity   Alcohol use: Yes    Alcohol/week: 0.0 standard drinks    Comment: denies; reports 3-4 beers/night to Dr. 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    Hepatitis B & C   Has 2 dogs and 5 cats   Did inpatient substance abuse rehab in 1993         Social Determinants of Health   Financial Resource Strain: Not on file  Food Insecurity: Not on file  Transportation Needs: Not on file  Physical Activity: Not on file  Stress: Not on file  Social Connections: Not on file     Family History: The patient's family history includes Breast cancer in her maternal aunt, maternal aunt, and maternal aunt; Cancer in her paternal uncle; Cancer (age of onset: 14) in her father; Dementia in her father and mother; Hyperlipidemia in her mother; Hypertension in her mother. There is no history of Colon cancer.  ROS:    Please see the history of present illness.     EKGs/Labs/Other Studies Reviewed:    EKG:  The ekg ordered today demonstrates this rhythm, septal infarct, heart rate 64, PR interval 198 ms, QRS duration 66 ms  Recent Labs: 03/24/2020: ALT 14; BUN 11; Creatinine, Ser 0.86; Hemoglobin 14.5; Potassium 4.8; Sodium 131; TSH 1.230   Recent Lipid Panel Lab Results  Component Value Date/Time   CHOL 187 02/03/2019 12:00 AM   TRIG  137 02/03/2019 12:00 AM   HDL 50 02/03/2019 12:00 AM   LDLCALC 110 02/03/2019 12:00 AM    Physical Exam:    VS:  BP 138/82   Pulse 68   Ht 5\' 5"  (1.651 m)   Wt 177 lb 6.4 oz (80.5 kg)   SpO2 98%   BMI 29.52 kg/m    No data found.  Wt Readings from Last 3 Encounters:  03/21/21 177 lb 6.4 oz (80.5 kg)  03/24/20 177 lb 9.6 oz (80.6 kg)  03/20/20 179 lb 9.6 oz (81.5 kg)     GEN:  Well nourished, well developed in no acute distress, obese, ambulates with a cane HEENT: Normal NECK: No JVD; No carotid bruits CARDIAC: RRR, no murmurs, rubs, gallops RESPIRATORY:  Clear to auscultation without rales, wheezing or rhonchi  ABDOMEN: Soft, non-tender, non-distended MUSCULOSKELETAL: No edema; No deformity  SKIN: Warm and dry NEUROLOGIC:  Alert and oriented PSYCHIATRIC:  Normal affect     ASSESSMENT AND PLAN   SVT, rare -Continue atenolol.  If SVT occurs more often, will consider EP referral.  Hypertension, reasonably controlled -Blood pressure elevated today given pain.  Continue atenolol. -Goal BP is <130/80.  Recommend DASH diet (high in vegetables, fruits, low-fat dairy products, whole grains, poultry, fish, and nuts and low in sweets, sugar-sweetened beverages, and red meats), salt restriction and increase physical activity.  Hyperlipidemia -LDL 91 and triglycerides 296 in 02/2021.  Her ASCVD risk is 17% for heart attack or stroke 10 years.  With her continued use of tobacco recommend we start Crestor 10 mg daily. -Check fasting lipids and liver  function within 3 months. -Discussed cholesterol lowering diets - Mediterranean diet, DASH diet, vegetarian diet, low-carbohydrate diet and avoidance of trans fats.  Discussed healthier choice substitutes.  Nuts, high-fiber foods, and fiber supplements may also improve lipids.    Obesity -Discussed how even a 5-10% weight loss can have cardiovascular benefits.   -Recommend moderate intensity activity for 30 minutes 5 days/week and the DASH diet.  Tobacco use  -Recommend tobacco cessation.  Reviewed physiologic effects of nicotine and the immediate-eventual benefits of quitting including improvement in cough/breathing and reduction in cardiovascular events.  Discussed quitting tips such as removing triggers and getting support from family/friends and Quitline .  Disposition - Follow-up in 1 year with Dr. Stanford Breed.        Medication Adjustments/Labs and Tests Ordered: Current medicines are reviewed at length with the patient today.  Concerns regarding medicines are outlined above.  Orders Placed This Encounter  Procedures   Hepatic function panel   Lipid panel   EKG 12-Lead    Meds ordered this encounter  Medications   atenolol (TENORMIN) 50 MG tablet    Sig: TAKE 1 TABLET(50 MG) BY MOUTH TWICE DAILY    Dispense:  180 tablet    Refill:  0   rosuvastatin (CRESTOR) 10 MG tablet    Sig: Take 1 tablet (10 mg total) by mouth daily.    Dispense:  90 tablet    Refill:  3     Patient Instructions  Medication Instructions:   Start taking  rosuvastatin 10 mg  daily    *If you need a refill on your cardiac medications before your next appointment, please call your pharmacy*   Lab Work: Lipid  Hepatic  in 3 months  If you have labs (blood work) drawn today and your tests are completely normal, you will receive your results only by: [place] (if you have MyChart)  OR A paper copy in the mail If you have any lab test that is abnormal or we need to change your treatment, we  will call you to review the results.   Testing/Procedures: Not needed   Follow-Up: At [facility], you and your health needs are our priority.  As part of our continuing mission to provide you with exceptional heart care, we have created designated Provider Care Teams.  These Care Teams include your primary Cardiologist (physician) and Advanced Practice Providers (APPs -  Physician Assistants and Nurse Practitioners) who all work together to provide you with the care you need, when you need it.     Your next appointment:   12 month(s)  The format for your next appointment:   In Person  Provider:   Kirk Ruths, MD   Other Instructions    Signed, Warren Lacy, PA-C  03/21/2021 10:42 AM    [place]

## 2021-03-21 ENCOUNTER — Encounter: Payer: Self-pay | Admitting: Physician Assistant

## 2021-03-21 ENCOUNTER — Ambulatory Visit: Payer: Medicare HMO | Admitting: Physician Assistant

## 2021-03-21 ENCOUNTER — Other Ambulatory Visit: Payer: Self-pay

## 2021-03-21 VITALS — BP 138/82 | HR 68 | Ht 65.0 in | Wt 177.4 lb

## 2021-03-21 DIAGNOSIS — F17200 Nicotine dependence, unspecified, uncomplicated: Secondary | ICD-10-CM

## 2021-03-21 DIAGNOSIS — E782 Mixed hyperlipidemia: Secondary | ICD-10-CM | POA: Diagnosis not present

## 2021-03-21 DIAGNOSIS — R002 Palpitations: Secondary | ICD-10-CM | POA: Diagnosis not present

## 2021-03-21 DIAGNOSIS — I471 Supraventricular tachycardia: Secondary | ICD-10-CM

## 2021-03-21 MED ORDER — ATENOLOL 50 MG PO TABS: ORAL_TABLET | ORAL | 0 refills | Status: DC

## 2021-03-21 MED ORDER — ROSUVASTATIN CALCIUM 10 MG PO TABS: 10.0000 mg | ORAL_TABLET | Freq: Every day | ORAL | 3 refills | Status: DC

## 2021-03-21 NOTE — Patient Instructions (Signed)
Medication Instructions:   Start taking  rosuvastatin 10 mg  daily    *If you need a refill on your cardiac medications before your next appointment, please call your pharmacy*   Lab Work: Lipid  Hepatic  in 3 months  If you have labs (blood work) drawn today and your tests are completely normal, you will receive your results only by: [place] (if you have MyChart) OR A paper copy in the mail If you have any lab test that is abnormal or we need to change your treatment, we will call you to review the results.   Testing/Procedures: Not needed   Follow-Up: At [facility], you and your health needs are our priority.  As part of our continuing mission to provide you with exceptional heart care, we have created designated Provider Care Teams.  These Care Teams include your primary Cardiologist (physician) and Advanced Practice Providers (APPs -  Physician Assistants and Nurse Practitioners) who all work together to provide you with the care you need, when you need it.     Your next appointment:   12 month(s)  The format for your next appointment:   In Person  Provider:   Kirk Ruths, MD   Other Instructions

## 2021-05-19 ENCOUNTER — Emergency Department (HOSPITAL_COMMUNITY): Payer: Medicare HMO

## 2021-05-19 ENCOUNTER — Other Ambulatory Visit: Payer: Self-pay

## 2021-05-19 ENCOUNTER — Emergency Department (HOSPITAL_COMMUNITY)
Admission: EM | Admit: 2021-05-19 | Discharge: 2021-05-19 | Discharge status: Against Medical Advice | Payer: Medicare HMO | Attending: Emergency Medicine | Admitting: Emergency Medicine

## 2021-05-19 ENCOUNTER — Encounter (HOSPITAL_COMMUNITY): Payer: Self-pay | Admitting: Emergency Medicine

## 2021-05-19 DIAGNOSIS — Z5321 Procedure and treatment not carried out due to patient leaving prior to being seen by health care provider: Secondary | ICD-10-CM | POA: Insufficient documentation

## 2021-05-19 DIAGNOSIS — R002 Palpitations: Secondary | ICD-10-CM | POA: Diagnosis not present

## 2021-05-19 LAB — BASIC METABOLIC PANEL
Anion gap: 11 (ref 5–15)
BUN: 10 mg/dL (ref 8–23)
CO2: 19 mmol/L — ABNORMAL LOW (ref 22–32)
Calcium: 8.6 mg/dL — ABNORMAL LOW (ref 8.9–10.3)
Chloride: 103 mmol/L (ref 98–111)
Creatinine, Ser: 0.73 mg/dL (ref 0.44–1.00)
GFR, Estimated: >60 mL/min (ref >60)
Glucose, Bld: 94 mg/dL (ref 70–99)
Potassium: 3.2 mmol/L — ABNORMAL LOW (ref 3.5–5.1)
Sodium: 133 mmol/L — ABNORMAL LOW (ref 135–145)

## 2021-05-19 LAB — MAGNESIUM: Magnesium: 1.6 mg/dL — ABNORMAL LOW (ref 1.7–2.4)

## 2021-05-19 LAB — CBC
HCT: 40.3 % (ref 36.0–46.0)
Hemoglobin: 13 g/dL (ref 12.0–15.0)
MCH: 26.8 pg (ref 26.0–34.0)
MCHC: 32.3 g/dL (ref 30.0–36.0)
MCV: 83.1 fL (ref 80.0–100.0)
Platelets: 302 10*3/uL (ref 150–400)
RBC: 4.85 MIL/uL (ref 3.87–5.11)
RDW: 14.5 % (ref 11.5–15.5)
WBC: 7.7 10*3/uL (ref 4.0–10.5)
nRBC: 0 % (ref 0.0–0.2)

## 2021-05-19 LAB — TROPONIN I (HIGH SENSITIVITY): Troponin I (High Sensitivity): 4 ng/L (ref <18)

## 2021-05-19 NOTE — ED Provider Notes (Signed)
I signed up for patient and went to evaluate her but it appears she has eloped. I never evaluated her.   Sherwood Gambler, MD 05/19/21 [phone]

## 2021-05-19 NOTE — ED Triage Notes (Signed)
Pt states she has been in and out of SVT for several months that has been worse the last 4 days.  Reports sob, fatigue, and generalized weakness.  Intermittent L sided thoracic back pain.

## 2021-05-19 NOTE — ED Provider Notes (Signed)
Emergency Medicine Provider Triage Evaluation Note  [name] , a 65 y.o. female  was evaluated in triage.  Pt complains of frequent episodes of palpitations.  Patient with known history of SVT.  Over the past few months she has been having very frequent episodes of palpitations.  She says that it will feel like her heart is beating fast and then skipping a beat and then just not beating in a regular rhythm.  She has been following up regularly as an outpatient.  She reports that she was having palpitations that lasted all day long on Thursday, saw her PCP who recommended she follow-up with her cardiologist.  She reports this morning she had more episodes and just was not feeling right so came in for evaluation.  Currently she reports it feels that her heart is beating normally and she denies chest pain or shortness of breath.  Does report she was recently diagnosed with a kidney infection and is on antibiotics.  Review of Systems  Positive: Palpitations Negative: Chest pain, shortness of breath, abdominal pain, fevers  Physical Exam  BP 132/78 (BP Location: Left Arm)    Pulse 70    Temp 98.5 F (36.9 C) (Oral)    Resp 18    SpO2 96%  Gen:   Awake, no distress   Resp:  Normal effort, CTA bilat Cardiac:  RRR MSK:   Moves extremities without difficulty  Other:    Medical Decision Making  Medically screening exam initiated at 12:19 PM.  Appropriate orders placed.  Haevyn Ury Kulaga was informed that the remainder of the evaluation will be completed by another provider, this initial triage assessment does not replace that evaluation, and the importance of remaining in the ED until their evaluation is complete.     Jacqlyn Larsen, PA-C 05/19/21 1226    Dorie Rank, MD 05/19/21 [phone]

## 2021-05-19 NOTE — ED Notes (Signed)
Pt signed AMA form at this time. Unable to wait for provider. Dr.Goldston is aware.

## 2021-05-23 ENCOUNTER — Encounter: Payer: Self-pay | Admitting: Cardiology

## 2021-05-29 ENCOUNTER — Ambulatory Visit: Payer: Medicare Other | Admitting: Hematology and Oncology

## 2021-06-20 NOTE — Progress Notes (Signed)
Patient Care Team: Elinor Parkinson as PCP - General (Physician Assistant) Stanford Breed Denice Bors, MD as PCP - Cardiology (Cardiology) Nicholas Lose, MD as Consulting Physician (Hematology and Oncology) [name], MD as Consulting Physician (General Surgery) Thea Silversmith, MD as Consulting Physician (Radiation Oncology) Suella Broad, MD as Consulting Physician (Physical Medicine and Rehabilitation) Stanford Breed Denice Bors, MD as Consulting Physician (Cardiology)  DIAGNOSIS:    ICD-10-CM   1. Malignant neoplasm of upper-outer quadrant of right breast in female, estrogen receptor positive ([place])  C50.411    Z17.0       SUMMARY OF ONCOLOGIC HISTORY: Oncology History  Breast cancer of upper-outer quadrant of right female breast ([place])  12/16/2013 Mammogram   1. A palpable 4.1 cm diameter mass/asymmetry with associated coarse heterogeneous microcalcifications at 11 o'clock 2 cm from the right nipple demonstrates mammographic and sonographic features highly suggestive of malignancy.    12/16/2013 Initial Biopsy   Breast cancer of upper-outer quadrant of right female breast; Invasive Ductal Cancer Er 100%, PR: 0%; Her2 Positive Ratio 4.28: Ki 67:24%;    12/23/2013 Breast MRI   Right Breast 4.4 x 3 x 2.5 cm and a sub cm satellite lesion. No LN   01/28/2014 - 05/27/2014 Neo-Adjuvant Chemotherapy   TC HP x2 cycles ( developed hand-foot syndrome) GC HP x1 cycle (developed dehydration and renal failure) CHP from 04/08/2014   07/21/2014 - 09/01/2014 Chemotherapy   Herceptin maintenance therapy   07/28/2014 Surgery   Right breast mastectomy: DCIS 7 cm, 4 lymph nodes negative, no invasive breast cancer was identified, complete pathologic response   08/19/2014 - 04/20/2015 Anti-estrogen oral therapy   Anastrozole 1 mg daily stopped 09/23/2014 due to diffuse body aches, diarrhea, nausea, fatigue we switched her to tamoxifen in 03/22/2015 stopped after 2 weeks with diarrhea, tiredness and nausea      CHIEF COMPLIANT: Follow-up of right breast cancer  INTERVAL HISTORY: [name] is a 66 y.o. with above-mentioned history of right-sided breast cancer treated with neoadjuvant chemotherapy followed by right mastectomy. She could not tolerate anastrozole on tamoxifen. She is currently off all antiestrogen therapy. She presents to the clinic today for follow-up.  She had been lost to follow-up for several years because of loss of insurance followed by COVID. She has been to orthopedic specialist for multiple bone pains and aches and she has been getting injections and has been on pain medication for her arthritic problems. She finally decided to come back to [place] for further evaluation of her history of breast cancer.  Her last mammogram was in 2017.  ALLERGIES:  is allergic to codeine and lunesta [eszopiclone].  MEDICATIONS:  Current Outpatient Medications  Medication Sig Dispense Refill   acyclovir (ZOVIRAX) 400 MG tablet Take 1 tablet (400 mg total) by mouth 4 (four) times daily. 50 tablet 0   atenolol (TENORMIN) 50 MG tablet TAKE 1 TABLET(50 MG) BY MOUTH TWICE DAILY 180 tablet 0   gabapentin (NEURONTIN) 400 MG capsule TAKE 1 CAPSULE(400 MG) BY MOUTH THREE TIMES DAILY 270 capsule 3   gabapentin (NEURONTIN) 600 MG tablet Take by mouth.     ibuprofen (ADVIL,MOTRIN) 600 MG tablet Take 1 tablet (600 mg total) by mouth every 6 (six) hours as needed. 30 tablet 0   methocarbamol (ROBAXIN) 500 MG tablet Take by mouth.     omeprazole (PRILOSEC) 20 MG capsule Take 20 mg by mouth 2 (two) times daily before a meal.     oxycodone (OXY-IR) 5 MG capsule Take 5 mg  by mouth every 4 (four) hours as needed.     rosuvastatin (CRESTOR) 10 MG tablet Take 1 tablet (10 mg total) by mouth daily. 90 tablet 3   No current facility-administered medications for this visit.    PHYSICAL EXAMINATION: ECOG PERFORMANCE STATUS: 1 - Symptomatic but completely ambulatory  Vitals:   06/21/21 0925  BP: (!) 144/78   Pulse: 64  Temp: 98.6 F (37 C)  SpO2: 96%   Filed Weights   06/21/21 0925  Weight: 166 lb 4.8 oz (75.4 kg)    BREAST: Right mastectomy, no palpable lumps or nodules in the left breast.  (exam performed in the presence of a chaperone)  LABORATORY DATA:  I have reviewed the data as listed CMP Latest Ref Rng & Units 05/19/2021 03/24/2020 02/03/2017  Glucose 70 - 99 mg/dL 94 101(H) 108(H)  BUN 8 - 23 mg/dL 10 11 <5(L)  Creatinine 0.44 - 1.00 mg/dL 0.73 0.86 0.71  Sodium 135 - 145 mmol/L 133(L) 131(L) 136  Potassium 3.5 - 5.1 mmol/L 3.2(L) 4.8 3.3(L)  Chloride 98 - 111 mmol/L 103 97 97(L)  CO2 22 - 32 mmol/L 19(L) 19(L) 26  Calcium 8.9 - 10.3 mg/dL 8.6(L) 9.3 9.3  Total Protein 6.0 - 8.5 g/dL - 7.7 -  Total Bilirubin 0.0 - 1.2 mg/dL - 0.3 -  Alkaline Phos 44 - 121 IU/L - 147(H) -  AST 0 - 40 IU/L - 14 -  ALT 0 - 32 IU/L - 14 -    Lab Results  Component Value Date   WBC 7.7 05/19/2021   HGB 13.0 05/19/2021   HCT 40.3 05/19/2021   MCV 83.1 05/19/2021   PLT 302 05/19/2021   NEUTROABS 4.9 03/24/2020    ASSESSMENT & PLAN:  Breast cancer of upper-outer quadrant of right female breast ([place]) 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 Status post neoadjuvant chemotherapy with TCHPx2, GCHPx1, CHPx1, HPx2 , complete pathologic response with only residual DCIS 7 cm remaining. Anastrozole started 08/19/2014 stopped 09/18/2014, Herceptin stopped April 2016   Neuropathy: Slowly improving with time Tobacco: Patient wants to quit smoking.  We discussed about Chantix.  She will discuss this with her primary care physician.   Severe Back Pain: Follows with orthopedics Cts and Bone Scan 03/20/15: Normal, she has profound arthritis and symmetrically in all joints. Pain issues being managed by  orthopedics.  Breast cancer surveillance: 1.  Breast exam 06/21/2021: Benign 2. mammogram we do not have any record of mammogram since 2017 We will obtain another mammogram in the left breast.  Return to clinic in 1 year for follow-up  No orders of the defined types were placed in this encounter.  The patient has a good understanding of the overall plan. she agrees with it. she will call with any problems that may develop before the next visit here.  Total time spent: 30 mins including face to face time and time spent for planning, charting and coordination of care  Rulon Eisenmenger, MD, MPH 06/21/2021  I, Thana Ates, am acting as scribe for Dr. Nicholas Lose.  I have reviewed the above documentation for accuracy and completeness, and I agree with the above.

## 2021-06-21 ENCOUNTER — Inpatient Hospital Stay: Payer: Medicare HMO | Attending: Hematology and Oncology | Admitting: Hematology and Oncology

## 2021-06-21 ENCOUNTER — Other Ambulatory Visit: Payer: Self-pay

## 2021-06-21 DIAGNOSIS — Z170 Estrogen receptor positive status [ER+]: Secondary | ICD-10-CM | POA: Diagnosis not present

## 2021-06-21 DIAGNOSIS — Z79899 Other long term (current) drug therapy: Secondary | ICD-10-CM | POA: Insufficient documentation

## 2021-06-21 DIAGNOSIS — Z853 Personal history of malignant neoplasm of breast: Secondary | ICD-10-CM | POA: Insufficient documentation

## 2021-06-21 DIAGNOSIS — C50411 Malignant neoplasm of upper-outer quadrant of right female breast: Secondary | ICD-10-CM | POA: Diagnosis not present

## 2021-06-21 DIAGNOSIS — Z923 Personal history of irradiation: Secondary | ICD-10-CM | POA: Diagnosis not present

## 2021-06-21 DIAGNOSIS — Z87891 Personal history of nicotine dependence: Secondary | ICD-10-CM | POA: Diagnosis not present

## 2021-06-21 DIAGNOSIS — Z9011 Acquired absence of right breast and nipple: Secondary | ICD-10-CM | POA: Insufficient documentation

## 2021-06-21 DIAGNOSIS — Z9221 Personal history of antineoplastic chemotherapy: Secondary | ICD-10-CM | POA: Diagnosis not present

## 2021-06-21 MED ORDER — OXYCODONE HCL 5 MG PO CAPS: 10.0000 mg | ORAL_CAPSULE | Freq: Two times a day (BID) | ORAL | Status: DC | PRN

## 2021-06-21 NOTE — Assessment & Plan Note (Signed)
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 Status post neoadjuvant chemotherapy with TCHPx2, GCHPx1, CHPx1, HPx2 , complete pathologic response with only residual DCIS 7 cm remaining. Anastrozole started 08/19/2014 stopped 09/18/2014, Herceptin stopped April 2016  Neuropathy: Slowly improving with time Hypokalemia: off potassium Depression: Went back to sertraline  Severe Back Pain: Cts and Bone Scan 03/20/15: Normal, she has profound arthritis and symmetrically in all joints. Patient will go back to seeing her rheumatologist Dr. Ouida Sills for her pain issues.  Breast cancer surveillance: 1.  Breast exam 06/21/2021: Benign 2. mammogram we do not have any record of mammogram since 2017

## 2021-06-28 ENCOUNTER — Other Ambulatory Visit: Payer: Self-pay

## 2021-06-28 DIAGNOSIS — F17200 Nicotine dependence, unspecified, uncomplicated: Secondary | ICD-10-CM

## 2021-08-02 ENCOUNTER — Ambulatory Visit
Admission: RE | Admit: 2021-08-02 | Discharge: 2021-08-02 | Disposition: A | Discharge status: Home / Self Care | Payer: Medicare HMO | Source: Ambulatory Visit | Attending: Hematology and Oncology | Admitting: Hematology and Oncology

## 2021-08-03 ENCOUNTER — Other Ambulatory Visit: Payer: Self-pay | Admitting: Adult Health

## 2021-08-03 DIAGNOSIS — R928 Other abnormal and inconclusive findings on diagnostic imaging of breast: Secondary | ICD-10-CM

## 2021-08-16 ENCOUNTER — Ambulatory Visit
Admission: RE | Admit: 2021-08-16 | Discharge: 2021-08-16 | Disposition: A | Discharge status: Home / Self Care | Payer: Medicare HMO | Source: Ambulatory Visit | Attending: Adult Health | Admitting: Adult Health

## 2021-08-16 DIAGNOSIS — R928 Other abnormal and inconclusive findings on diagnostic imaging of breast: Secondary | ICD-10-CM

## 2021-09-07 ENCOUNTER — Other Ambulatory Visit: Payer: Self-pay

## 2021-09-07 MED ORDER — ATENOLOL 50 MG PO TABS: ORAL_TABLET | ORAL | 3 refills | Status: DC

## 2022-05-21 NOTE — Progress Notes (Unsigned)
Cardiology Office Note:    Date:  05/22/2022   ID:  [name], [name] 03-Oct-1955, MRN [id number]  PCP:  [name] [place] Cardiologist: [name] Ruths, MD   Reason for visit: 12 month follow-up  History of Present Illness:    [name] is a 67 y.o. female with a hx of  reported SVT with heart rate up to 200, tobacco use, rheumatoid arthritis.   I last saw her in November 2022.  She was recovering from back surgery.  Initially treated with baclofen which increased her palpitations.  She typically has rare episodes of SVT.  She mention palpitations mostly stress-induced.  She was still smoking and states it is a vice she is unable to give up with her anxiety and pain.   With continued tobacco use & ASCVD risk is 17%, I recommended starting Crestor.  Patient today's states that she stopped smoking cigarettes.  She is currently vaping.  She has noticed that her shortness of breath, clearing her throat and coughing has improved.  She also started using THC Gummies for her back pain and is off oxycodone.  She is proud of these improvements.  Since her last appointment she also started Wellbutrin for mood and to help her quit smoking.  She mentions that she had some leg pain with Crestor and has concerned about dementia risk with statins.  She stopped Crestor.    Regarding her palpitations, she states she knows how to control them.  If she feels like she is going SVT, she will cough or hold her breath.  She is not interested in ablation due to surgery risk.  She does have concern today about after she stands up for a minute, she will feel brief chest tightness and lightheadedness.      Past Medical History:  Diagnosis Date   Allergy    Phreesia 03/21/2020   Anemia    Phreesia 03/21/2020   Anxiety    Arthritis    Phreesia 03/21/2020   Blood transfusion without reported diagnosis    Phreesia 03/21/2020   Cancer ([place])    right breast cancer   Depression     Depression    Phreesia 03/21/2020   Fibromyalgia    GERD (gastroesophageal reflux disease)    Phreesia 03/21/2020   Heavy smoker    Hepatitis C    Pt was given too much interferon - critical care x2d, 5 days total hosp   Neuromuscular disorder ([place])    Phreesia 03/21/2020   Osteoporosis    Perimenopausal 7/05   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), hypoglycemia 2/04-resolved with PO calcium + D, SVT, was treated with iterferon and ribavirin-10/3-2/04   PUD (peptic ulcer disease)    Rheumatoid arthritis(714.0)    possible   SVT (supraventricular tachycardia)    Wears glasses     Past Surgical History:  Procedure Laterality Date   ABDOMINAL HYSTERECTOMY     BREAST SURGERY N/A    Phreesia 03/21/2020   CESAREAN SECTION  09/17/80   CESAREAN SECTION N/A    Phreesia 03/21/2020   COLONOSCOPY     DILATION AND CURETTAGE OF UTERUS     hysterectomy-for dysmenorrhea  09/17/00   JOINT REPLACEMENT N/A    Phreesia 03/21/2020   MASTECTOMY W/ SENTINEL NODE BIOPSY Right 07/28/2014   Procedure: RIGHT MASTECTOMY WITH SENTINEL LYMPH NODE BIOPSY ;  Surgeon: [name], MD;  Location: [facility];  Service: General;  Laterality: Right;   NASAL SINUS SURGERY  03/20/84   for deviated septum   PORTACATH PLACEMENT Left 01/20/2014   Procedure: INSERTION PORT-A-CATH;  Surgeon: [name], MD;  Location: [facility];  Service: General;  Laterality: Left;   TONSILLECTOMY  05/20/61   UPPER GI ENDOSCOPY      Current Medications: Current Meds  Medication Sig   atenolol (TENORMIN) 50 MG tablet TAKE 1 TABLET(50 MG) BY MOUTH TWICE DAILY   gabapentin (NEURONTIN) 600 MG tablet Take by mouth.   ibuprofen (ADVIL,MOTRIN) 600 MG tablet Take 1 tablet (600 mg total) by mouth every 6 (six) hours as needed. (Patient taking differently: Take 200 mg by mouth every 6 (six) hours as needed.)     Allergies:   Codeine   Social History    Socioeconomic History   Marital status: Divorced    Spouse name: Not on file   Number of children: Not on file   Years of education: Not on file   Highest education level: Not on file  Occupational History   Occupation: disability  Tobacco Use   Smoking status: Every Day    Packs/day: 1.00    Years: 45.00    Total pack years: 45.00    Types: Cigarettes   Smokeless tobacco: Never   Tobacco comments:    2 ppd since 67 yo   Vaping Use   Vaping Use: Never used  Substance and Sexual Activity   Alcohol use: Yes    Alcohol/week: 0.0 standard drinks of alcohol    Comment: denies; reports 3-4 beers/night to Dr. 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    Hepatitis B & C   Has 2 dogs and 5 cats   Did inpatient substance abuse rehab in 1993         Social Determinants of Health   Financial Resource Strain: Not on file  Food Insecurity: Not on file  Transportation Needs: Not on file  Physical Activity: Not on file  Stress: Not on file  Social Connections: Not on file     Family History: The patient's family history includes Breast cancer in her maternal aunt, maternal aunt, and maternal aunt; Cancer in her paternal uncle; Cancer (age of onset: 75) in her father; Dementia in her father and mother; Hyperlipidemia in her mother; Hypertension in her mother. There is no history of Colon cancer.  ROS:   Please see the history of present illness.     EKGs/Labs/Other Studies Reviewed:    EKG:  The ekg ordered today demonstrates normal sinus rhythm with heart  rate 67.  Recent Labs: No results found for requested labs within last 365 days.   Recent Lipid Panel Lab Results  Component Value Date/Time   CHOL 187 02/03/2019 12:00 AM   TRIG 137 02/03/2019 12:00 AM   HDL 50 02/03/2019 12:00 AM   LDLCALC 110 02/03/2019 12:00 AM    Physical Exam:    VS:  BP (!) 143/88   Pulse 67   Ht '5\' 5"'$  (1.651 m)  Wt 182 lb (82.6 kg)   SpO2 96%   BMI 30.29 kg/m    No data found.   Wt Readings from Last 3 Encounters:  05/22/22 182 lb (82.6 kg)  06/21/21 166 lb 4.8 oz (75.4 kg)  03/21/21 177 lb 6.4 oz (80.5 kg)     GEN: Well nourished, well developed in no acute distress HEENT: Normal NECK: No JVD; No carotid bruits CARDIAC: RRR, no murmurs, rubs, gallops RESPIRATORY:  Clear to auscultation without rales, wheezing or rhonchi  ABDOMEN: Soft, non-tender, non-distended MUSCULOSKELETAL: No edema SKIN: Warm and dry NEUROLOGIC:  Alert and oriented PSYCHIATRIC:  Normal affect     ASSESSMENT AND PLAN   SVT, rare -EKG today shows normal sinus rhythm with heart rate 67. -Continue atenolol.    Hypertension, reasonably controlled -Continue atenolol. -Goal BP is <130/80.  Recommend DASH diet (high in vegetables, fruits, low-fat dairy products, whole grains, poultry, fish, and nuts and low in sweets, sugar-sweetened beverages, and red meats), salt restriction and increase physical activity.  Lightheadedness -Check orthostatics -orthostatics negative.  Recommend hydration, changing positions slowly and compression stockings. -Discuss with PCP if her neurogenic medications (gabapentin, Wellbutrin, THC Gummies) may be contributing.   Hyperlipidemia -LDL 91 and triglycerides 296 in 02/2021.  With  ASCVD risk of 17%, we started her on Crestor 10 mg daily.  -She had some leg pain with Crestor 10 mg and had concern of dementia risk.  She is willing to try Crestor 5 mg daily. -Check fasting lipids and liver function within 3 months.   Vaping -Recommend  complete nicotine cessation.   Disposition - Follow-up in 1 year.   Medication Adjustments/Labs and Tests Ordered: Current medicines are reviewed at length with the patient today.  Concerns regarding medicines are outlined above.  Orders Placed This Encounter  Procedures   Lipid panel   EKG 12-Lead   No orders of the defined types were placed in this encounter.   There are no Patient Instructions on file for this visit.   Signed, Warren Lacy, PA-C  05/22/2022 10:22 AM    [place]

## 2022-05-22 ENCOUNTER — Encounter: Payer: Self-pay | Admitting: Physician Assistant

## 2022-05-22 ENCOUNTER — Ambulatory Visit: Payer: Medicare HMO | Attending: Physician Assistant | Admitting: Physician Assistant

## 2022-05-22 VITALS — BP 143/88 | HR 67 | Ht 65.0 in | Wt 182.0 lb

## 2022-05-22 DIAGNOSIS — I480 Paroxysmal atrial fibrillation: Secondary | ICD-10-CM

## 2022-05-22 DIAGNOSIS — E782 Mixed hyperlipidemia: Secondary | ICD-10-CM | POA: Diagnosis not present

## 2022-05-22 DIAGNOSIS — F17200 Nicotine dependence, unspecified, uncomplicated: Secondary | ICD-10-CM | POA: Diagnosis not present

## 2022-05-22 DIAGNOSIS — I471 Supraventricular tachycardia, unspecified: Secondary | ICD-10-CM

## 2022-05-22 NOTE — Patient Instructions (Signed)
Medication Instructions:  No Changes *If you need a refill on your cardiac medications before your next appointment, please call your pharmacy*   Lab Work: Lipid Panel, Hepatic Panel. 3 months If you have labs (blood work) drawn today and your tests are completely normal, you will receive your results only by: [place] (if you have MyChart) OR A paper copy in the mail If you have any lab test that is abnormal or we need to change your treatment, we will call you to review the results.   Testing/Procedures: No Testing   Follow-Up: At [facility], you and your health needs are our priority.  As part of our continuing mission to provide you with exceptional heart care, we have created designated Provider Care Teams.  These Care Teams include your primary Cardiologist (physician) and Advanced Practice Providers (APPs -  Physician Assistants and Nurse Practitioners) who all work together to provide you with the care you need, when you need it.  We recommend signing up for the patient portal called "MyChart".  Sign up information is provided on this After Visit Summary.  MyChart is used to connect with patients for Virtual Visits (Telemedicine).  Patients are able to view lab/test results, encounter notes, upcoming appointments, etc.  Non-urgent messages can be sent to your provider as well.   To learn more about what you can do with MyChart, go to [email or web address].    Your next appointment:   1 year(s)  The format for your next appointment:   In Person  Provider:   Kirk Ruths, MD     Other Instructions Consider discussing Gabapentin and Wellbutrin Dosage with Primary Care Provider.

## 2022-06-17 ENCOUNTER — Telehealth: Payer: Self-pay | Admitting: Hematology and Oncology

## 2022-06-17 NOTE — Telephone Encounter (Signed)
Cancelled appointments per scheduling message. Left voicemail.

## 2022-06-20 ENCOUNTER — Ambulatory Visit: Payer: Medicare HMO | Admitting: Hematology and Oncology

## 2022-08-12 MED ORDER — EZETIMIBE 10 MG PO TABS: 10.0000 mg | ORAL_TABLET | Freq: Every day | ORAL | 3 refills | Status: DC

## 2022-08-12 NOTE — Telephone Encounter (Signed)
From last note: Hyperlipidemia -LDL 91 and triglycerides 296 in 02/2021.  With  ASCVD risk of 17%, we started her on Crestor 10 mg daily.  -She had some leg pain with Crestor 10 mg and had concern of dementia risk.  She is willing to try Crestor 5 mg daily.    Could zetia be an option?

## 2022-08-16 ENCOUNTER — Other Ambulatory Visit: Payer: Self-pay | Admitting: Hematology and Oncology

## 2022-08-16 DIAGNOSIS — Z1231 Encounter for screening mammogram for malignant neoplasm of breast: Secondary | ICD-10-CM

## 2022-09-09 ENCOUNTER — Encounter: Payer: Self-pay | Admitting: Cardiology

## 2022-09-11 ENCOUNTER — Other Ambulatory Visit: Payer: Self-pay | Admitting: *Deleted

## 2022-09-11 MED ORDER — ATENOLOL 50 MG PO TABS: ORAL_TABLET | ORAL | 3 refills | Status: DC

## 2022-10-02 ENCOUNTER — Ambulatory Visit
Admission: RE | Admit: 2022-10-02 | Discharge: 2022-10-02 | Disposition: A | Discharge status: Home / Self Care | Payer: Medicare HMO | Source: Ambulatory Visit | Attending: Hematology and Oncology | Admitting: Hematology and Oncology

## 2022-10-02 DIAGNOSIS — Z1231 Encounter for screening mammogram for malignant neoplasm of breast: Secondary | ICD-10-CM

## 2022-10-02 HISTORY — DX: Personal history of antineoplastic chemotherapy: Z92.21

## 2022-10-03 ENCOUNTER — Other Ambulatory Visit (HOSPITAL_COMMUNITY): Payer: Self-pay | Admitting: Physician Assistant

## 2022-10-03 DIAGNOSIS — R911 Solitary pulmonary nodule: Secondary | ICD-10-CM

## 2022-10-10 NOTE — Progress Notes (Signed)
Patient Care Team: Leonard Downing as PCP - General (Physician Assistant) Lewayne Bunting, MD as PCP - Cardiology (Cardiology) Serena Croissant, MD as Consulting Physician (Hematology and Oncology) Almond Lint, MD as Consulting Physician (General Surgery) Lurline Hare, MD as Consulting Physician (Radiation Oncology) Sheran Luz, MD as Consulting Physician (Physical Medicine and Rehabilitation) Jens Som Madolyn Frieze, MD as Consulting Physician (Cardiology)  DIAGNOSIS: No diagnosis found.  SUMMARY OF ONCOLOGIC HISTORY: Oncology History  Breast cancer of upper-outer quadrant of right female breast (HCC)  12/16/2013 Mammogram   1. A palpable 4.1 cm diameter mass/asymmetry with associated coarse heterogeneous microcalcifications at 11 o'clock 2 cm from the right nipple demonstrates mammographic and sonographic features highly suggestive of malignancy.    12/16/2013 Initial Biopsy   Breast cancer of upper-outer quadrant of right female breast; Invasive Ductal Cancer Er 100%, PR: 0%; Her2 Positive Ratio 4.28: Ki 67:24%;    12/23/2013 Breast MRI   Right Breast 4.4 x 3 x 2.5 cm and a sub cm satellite lesion. No LN   01/28/2014 - 05/27/2014 Neo-Adjuvant Chemotherapy   TC HP x2 cycles ( developed hand-foot syndrome) GC HP x1 cycle (developed dehydration and renal failure) CHP from 04/08/2014   07/21/2014 - 09/01/2014 Chemotherapy   Herceptin maintenance therapy   07/28/2014 Surgery   Right breast mastectomy: DCIS 7 cm, 4 lymph nodes negative, no invasive breast cancer was identified, complete pathologic response   08/19/2014 - 04/20/2015 Anti-estrogen oral therapy   Anastrozole 1 mg daily stopped 09/23/2014 due to diffuse body aches, diarrhea, nausea, fatigue we switched her to tamoxifen in 03/22/2015 stopped after 2 weeks with diarrhea, tiredness and nausea     CHIEF COMPLIANT: Follow-up of right breast cancer   INTERVAL HISTORY: NIOBE FRIIS is a 67 y.o. with above-mentioned history of  right-sided breast cancer treated with neoadjuvant chemotherapy followed by right mastectomy. She could not tolerate anastrozole on tamoxifen.  She presents to the clinic for a follow-up.    ALLERGIES:  is allergic to codeine.  MEDICATIONS:  Current Outpatient Medications  Medication Sig Dispense Refill   atenolol (TENORMIN) 50 MG tablet TAKE 1 TABLET(50 MG) BY MOUTH TWICE DAILY 180 tablet 3   buPROPion (WELLBUTRIN XL) 300 MG 24 hr tablet Take 300 mg by mouth daily.     ezetimibe (ZETIA) 10 MG tablet Take 1 tablet (10 mg total) by mouth daily. 90 tablet 3   gabapentin (NEURONTIN) 600 MG tablet Take by mouth.     ibuprofen (ADVIL,MOTRIN) 600 MG tablet Take 1 tablet (600 mg total) by mouth every 6 (six) hours as needed. (Patient taking differently: Take 200 mg by mouth every 6 (six) hours as needed.) 30 tablet 0   No current facility-administered medications for this visit.    PHYSICAL EXAMINATION: ECOG PERFORMANCE STATUS: {CHL ONC ECOG PS:[id number]}  There were no vitals filed for this visit. There were no vitals filed for this visit.  BREAST:*** No palpable masses or nodules in either right or left breasts. No palpable axillary supraclavicular or infraclavicular adenopathy no breast tenderness or nipple discharge. (exam performed in the presence of a chaperone)  LABORATORY DATA:  I have reviewed the data as listed    Latest Ref Rng & Units 05/19/2021   12:25 PM 03/24/2020   10:41 AM 02/03/2017    1:13 PM  CMP  Glucose 70 - 99 mg/dL 94  161  096   BUN 8 - 23 mg/dL 10  11  <5   Creatinine 0.44 - 1.00  mg/dL 1.61  0.96  0.45   Sodium 135 - 145 mmol/L 133  131  136   Potassium 3.5 - 5.1 mmol/L 3.2  4.8  3.3   Chloride 98 - 111 mmol/L 103  97  97   CO2 22 - 32 mmol/L 19  19  26    Calcium 8.9 - 10.3 mg/dL 8.6  9.3  9.3   Total Protein 6.0 - 8.5 g/dL  7.7    Total Bilirubin 0.0 - 1.2 mg/dL  0.3    Alkaline Phos 44 - 121 IU/L  147    AST 0 - 40 IU/L  14    ALT 0 - 32 IU/L  14       Lab Results  Component Value Date   WBC 7.7 05/19/2021   HGB 13.0 05/19/2021   HCT 40.3 05/19/2021   MCV 83.1 05/19/2021   PLT 302 05/19/2021   NEUTROABS 4.9 03/24/2020    ASSESSMENT & PLAN:  No problem-specific Assessment & Plan notes found for this encounter.    No orders of the defined types were placed in this encounter.  The patient has a good understanding of the overall plan. she agrees with it. she will call with any problems that may develop before the next visit here. Total time spent: 30 mins including face to face time and time spent for planning, charting and co-ordination of care   Sherlyn Lick, CMA 10/10/22    I Janan Ridge am acting as a Neurosurgeon for [employer]  ***

## 2022-10-16 ENCOUNTER — Telehealth: Payer: Self-pay | Admitting: Hematology and Oncology

## 2022-10-16 ENCOUNTER — Inpatient Hospital Stay: Payer: Medicare HMO | Attending: Hematology and Oncology | Admitting: Hematology and Oncology

## 2022-10-16 VITALS — BP 149/71 | HR 66 | Temp 97.7°F | Resp 17 | Wt 176.0 lb

## 2022-10-16 DIAGNOSIS — M549 Dorsalgia, unspecified: Secondary | ICD-10-CM | POA: Diagnosis not present

## 2022-10-16 DIAGNOSIS — R918 Other nonspecific abnormal finding of lung field: Secondary | ICD-10-CM | POA: Diagnosis not present

## 2022-10-16 DIAGNOSIS — Z9221 Personal history of antineoplastic chemotherapy: Secondary | ICD-10-CM | POA: Diagnosis not present

## 2022-10-16 DIAGNOSIS — G629 Polyneuropathy, unspecified: Secondary | ICD-10-CM | POA: Diagnosis not present

## 2022-10-16 DIAGNOSIS — M1990 Unspecified osteoarthritis, unspecified site: Secondary | ICD-10-CM | POA: Insufficient documentation

## 2022-10-16 DIAGNOSIS — F17210 Nicotine dependence, cigarettes, uncomplicated: Secondary | ICD-10-CM | POA: Insufficient documentation

## 2022-10-16 DIAGNOSIS — Z170 Estrogen receptor positive status [ER+]: Secondary | ICD-10-CM

## 2022-10-16 DIAGNOSIS — Z853 Personal history of malignant neoplasm of breast: Secondary | ICD-10-CM | POA: Diagnosis present

## 2022-10-16 DIAGNOSIS — C50411 Malignant neoplasm of upper-outer quadrant of right female breast: Secondary | ICD-10-CM | POA: Diagnosis not present

## 2022-10-16 NOTE — Telephone Encounter (Signed)
Scheduled appointment per 5/29 los. Patient is aware of the made appointment.  

## 2022-10-16 NOTE — Assessment & Plan Note (Addendum)
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 Status post neoadjuvant chemotherapy with TCHPx2, GCHPx1, CHPx1, HPx2 , complete pathologic response with only residual DCIS 7 cm remaining. Anastrozole started 08/19/2014 stopped 09/18/2014, Herceptin stopped April 2016   Neuropathy: Slowly improving with time Tobacco: Patient wants to quit smoking.  We discussed about Chantix.  She will discuss this with her primary care physician.   Severe Back Pain: Follows with orthopedics Cts and Bone Scan 03/20/15: Normal, she has profound arthritis and symmetrically in all joints. Pain issues being managed by orthopedics.   Breast cancer surveillance: 1.  Breast exam 06/21/2021: Benign 2. mammogram 10/03/2022 left breast: Benign breast density category B    CT chest for lung cancer screening: Left upper lobe nodule as well as right lung nodules PET CT scan is being ordered by [facility].  I suspect that she might need a biopsy if the PET/CT shows an abnormality.  Return to clinic in 1 year for follow-up

## 2022-10-24 ENCOUNTER — Encounter (HOSPITAL_COMMUNITY)
Admission: RE | Admit: 2022-10-24 | Discharge: 2022-10-24 | Disposition: A | Discharge status: Home / Self Care | Payer: Medicare HMO | Source: Ambulatory Visit | Attending: Physician Assistant | Admitting: Physician Assistant

## 2022-10-24 DIAGNOSIS — R911 Solitary pulmonary nodule: Secondary | ICD-10-CM | POA: Insufficient documentation

## 2022-10-24 LAB — GLUCOSE, CAPILLARY: Glucose-Capillary: 114 mg/dL — ABNORMAL HIGH (ref 70–99)

## 2022-10-24 MED ORDER — FLUDEOXYGLUCOSE F - 18 (FDG) INJECTION
8.7700 | Freq: Once | INTRAVENOUS | Status: AC
  Administered 2022-10-24: 8.77 via INTRAVENOUS

## 2022-10-29 ENCOUNTER — Telehealth: Payer: Self-pay | Admitting: *Deleted

## 2022-10-29 NOTE — Telephone Encounter (Signed)
Received call from pt stating she recently had a PET scan with PCP which is showing concerning results.  Pt requesting office visit to further discuss with MD.  Appt scheduled, pt verbalized understanding of appt details.

## 2022-10-30 ENCOUNTER — Institutional Professional Consult (permissible substitution): Payer: Medicare HMO | Admitting: Emergency Medicine

## 2022-10-30 NOTE — Progress Notes (Signed)
Patient Care Team: Leonard Downing as PCP - General (Physician Assistant) Lewayne Bunting, MD as PCP - Cardiology (Cardiology) Serena Croissant, MD as Consulting Physician (Hematology and Oncology) Almond Lint, MD as Consulting Physician (General Surgery) Lurline Hare, MD as Consulting Physician (Radiation Oncology) Sheran Luz, MD as Consulting Physician (Physical Medicine and Rehabilitation) Jens Som Madolyn Frieze, MD as Consulting Physician (Cardiology)  DIAGNOSIS:  Encounter Diagnosis  Name Primary?   Malignant neoplasm of upper-outer quadrant of right breast in female, estrogen receptor positive (HCC) Yes    SUMMARY OF ONCOLOGIC HISTORY: Oncology History  Breast cancer of upper-outer quadrant of right female breast (HCC)  12/16/2013 Mammogram   1. A palpable 4.1 cm diameter mass/asymmetry with associated coarse heterogeneous microcalcifications at 11 o'clock 2 cm from the right nipple demonstrates mammographic and sonographic features highly suggestive of malignancy.    12/16/2013 Initial Biopsy   Breast cancer of upper-outer quadrant of right female breast; Invasive Ductal Cancer Er 100%, PR: 0%; Her2 Positive Ratio 4.28: Ki 67:24%;    12/23/2013 Breast MRI   Right Breast 4.4 x 3 x 2.5 cm and a sub cm satellite lesion. No LN   01/28/2014 - 05/27/2014 Neo-Adjuvant Chemotherapy   TC HP x2 cycles ( developed hand-foot syndrome) GC HP x1 cycle (developed dehydration and renal failure) CHP from 04/08/2014   07/21/2014 - 09/01/2014 Chemotherapy   Herceptin maintenance therapy   07/28/2014 Surgery   Right breast mastectomy: DCIS 7 cm, 4 lymph nodes negative, no invasive breast cancer was identified, complete pathologic response   08/19/2014 - 04/20/2015 Anti-estrogen oral therapy   Anastrozole 1 mg daily stopped 09/23/2014 due to diffuse body aches, diarrhea, nausea, fatigue we switched her to tamoxifen in 03/22/2015 stopped after 2 weeks with diarrhea, tiredness and nausea      CHIEF COMPLIANT: Review pet-CT  INTERVAL HISTORY: [name] is a  67 y.o. with above-mentioned history of right-sided breast cancer treated with neoadjuvant chemotherapy followed by right mastectomy. She presents to the clinic for a follow-up.  She has a flareup of rheumatoid arthritis causing her diffuse body aches and pains. She complains of all her joints hurts in her body. Mainly in her neck. She says she can hardly turn her neck.  She is also emotionally stressed and anxious after reading the scan results.    ALLERGIES:  is allergic to codeine.  MEDICATIONS:  Current Outpatient Medications  Medication Sig Dispense Refill   ALPRAZolam (XANAX) 0.5 MG tablet Take by mouth.     atenolol (TENORMIN) 50 MG tablet TAKE 1 TABLET(50 MG) BY MOUTH TWICE DAILY 180 tablet 3   gabapentin (NEURONTIN) 600 MG tablet Take by mouth.     ibuprofen (ADVIL,MOTRIN) 600 MG tablet Take 1 tablet (600 mg total) by mouth every 6 (six) hours as needed. (Patient taking differently: Take 200 mg by mouth every 6 (six) hours as needed.) 30 tablet 0   letrozole (FEMARA) 2.5 MG tablet Take 1 tablet (2.5 mg total) by mouth daily. 90 tablet 3   oxyCODONE-acetaminophen (PERCOCET/ROXICET) 5-325 MG tablet Take 1 tablet by mouth every 8 (eight) hours as needed for severe pain. 60 tablet 0   pantoprazole (PROTONIX) 40 MG injection      predniSONE (DELTASONE) 10 MG tablet Take 0.5 tablets (5 mg total) by mouth daily with breakfast. 30 tablet 0   No current facility-administered medications for this visit.    PHYSICAL EXAMINATION: ECOG PERFORMANCE STATUS: 1 - Symptomatic but completely ambulatory  Vitals:   11/07/22  0840  BP: (!) 143/82  Pulse: 67  Resp: 18  Temp: (!) 97.5 F (36.4 C)  SpO2: 96%   Filed Weights   11/07/22 0840  Weight: 174 lb 11.2 oz (79.2 kg)      LABORATORY DATA:  I have reviewed the data as listed    Latest Ref Rng & Units 05/19/2021   12:25 PM 03/24/2020   10:41 AM 02/03/2017     1:13 PM  CMP  Glucose 70 - 99 mg/dL 94  098  119   BUN 8 - 23 mg/dL 10  11  <5   Creatinine 0.44 - 1.00 mg/dL 1.47  8.29  5.62   Sodium 135 - 145 mmol/L 133  131  136   Potassium 3.5 - 5.1 mmol/L 3.2  4.8  3.3   Chloride 98 - 111 mmol/L 103  97  97   CO2 22 - 32 mmol/L 19  19  26    Calcium 8.9 - 10.3 mg/dL 8.6  9.3  9.3   Total Protein 6.0 - 8.5 g/dL  7.7    Total Bilirubin 0.0 - 1.2 mg/dL  0.3    Alkaline Phos 44 - 121 IU/L  147    AST 0 - 40 IU/L  14    ALT 0 - 32 IU/L  14      Lab Results  Component Value Date   WBC 7.7 05/19/2021   HGB 13.0 05/19/2021   HCT 40.3 05/19/2021   MCV 83.1 05/19/2021   PLT 302 05/19/2021   NEUTROABS 4.9 03/24/2020    ASSESSMENT & PLAN:  Breast cancer of upper-outer quadrant of right female breast (HCC) 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 Status post neoadjuvant chemotherapy with TCHPx2, GCHPx1, CHPx1, HPx2 , complete pathologic response with only residual DCIS 7 cm remaining. Anastrozole started 08/19/2014 stopped 09/18/2014, Herceptin stopped April 2016   Neuropathy: Slowly improving with time Tobacco use   CT chest for lung cancer screening: Left upper lobe nodule as well as right lung nodules PET CT scan 10/28/2022: Hypermetabolic left upper lobe mass (3.1 x 4.6 cm) with hypermetabolic nodules in the right hemithorax (11 mm) and hypermetabolic ipsilateral mediastinal lymph node (AP window lymph node 5 mm).  Hypermetabolic right parotid node (15 mm), small hypermetabolic level 2 cervical lymph nodes 5 mm  Recommendation: Referral to interventional radiology for biopsy of the parotid lesion in the lung Previously patient could not tolerate antiestrogen therapy and therefore it was discontinued after a few months.  Therefore my clinical suspicion  is that this is metastatic breast cancer.  We would like to obtain a biopsy of the parotid gland as well as the lung to confirm this diagnosis and look for histopathology for ER/PR and HER2 testing.   Because this process may take a few weeks I recommend starting her on letrozole therapy.  She is very concerned about adverse effects of antiestrogen therapy but she is willing to try it.  Pain issues: I sent a prescription for prednisone 10 mg a day to treat the rheumatoid arthritis and also sent a prescription for Percocets.  Return to clinic after the biopsy to discuss results and to finalize her treatment plan.    No orders of the defined types were placed in this encounter.  The patient has  a good understanding of the overall plan. she agrees with it. she will call with any problems that may develop before the next visit here. Total time spent: 45 mins including face to face time and time spent for planning, charting and co-ordination of care   Tamsen Meek, MD 11/07/22    I Janan Ridge am acting as a Neurosurgeon for [employer]  I have reviewed the above documentation for accuracy and completeness, and I agree with the above.

## 2022-11-01 ENCOUNTER — Telehealth: Payer: Self-pay

## 2022-11-01 NOTE — Telephone Encounter (Signed)
Pt called and states she is experiencing excruciating pain that started 6/3, but has worsened. She states she feels this is an RA flare up, but she no longer sees a rheumatologist because they would not prescribe any medication for her d/t her ca hx.  She is asking for pain medication to get her through until she can see Dr Pamelia Hoit Thursday. Advised she would need to reach out to her rheumatologist with this concern or go to ED for further eval. She reports she can barely move her neck, is having hip, knee, toe and jaw pain. She was once again advised to go to ED for further eval. She verbalized thanks and understanding.

## 2022-11-07 ENCOUNTER — Other Ambulatory Visit: Payer: Self-pay | Admitting: Hematology and Oncology

## 2022-11-07 ENCOUNTER — Other Ambulatory Visit: Payer: Self-pay

## 2022-11-07 ENCOUNTER — Inpatient Hospital Stay: Payer: Medicare HMO | Attending: Hematology and Oncology | Admitting: Hematology and Oncology

## 2022-11-07 VITALS — BP 143/82 | HR 67 | Temp 97.5°F | Resp 18 | Ht 65.0 in | Wt 174.7 lb

## 2022-11-07 DIAGNOSIS — Z79811 Long term (current) use of aromatase inhibitors: Secondary | ICD-10-CM | POA: Diagnosis not present

## 2022-11-07 DIAGNOSIS — C50411 Malignant neoplasm of upper-outer quadrant of right female breast: Secondary | ICD-10-CM | POA: Insufficient documentation

## 2022-11-07 DIAGNOSIS — G629 Polyneuropathy, unspecified: Secondary | ICD-10-CM | POA: Diagnosis not present

## 2022-11-07 DIAGNOSIS — Z9011 Acquired absence of right breast and nipple: Secondary | ICD-10-CM | POA: Diagnosis not present

## 2022-11-07 DIAGNOSIS — Z170 Estrogen receptor positive status [ER+]: Secondary | ICD-10-CM | POA: Diagnosis not present

## 2022-11-07 DIAGNOSIS — Z79899 Other long term (current) drug therapy: Secondary | ICD-10-CM | POA: Diagnosis not present

## 2022-11-07 DIAGNOSIS — M069 Rheumatoid arthritis, unspecified: Secondary | ICD-10-CM | POA: Diagnosis not present

## 2022-11-07 MED ORDER — LETROZOLE 2.5 MG PO TABS: 2.5000 mg | ORAL_TABLET | Freq: Every day | ORAL | 3 refills | Status: DC

## 2022-11-07 MED ORDER — PREDNISONE 10 MG PO TABS: 5.0000 mg | ORAL_TABLET | Freq: Every day | ORAL | 0 refills | Status: DC

## 2022-11-07 MED ORDER — OXYCODONE-ACETAMINOPHEN 5-325 MG PO TABS: 1.0000 | ORAL_TABLET | Freq: Three times a day (TID) | ORAL | 0 refills | Status: DC | PRN

## 2022-11-07 NOTE — Assessment & Plan Note (Addendum)
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 Status post neoadjuvant chemotherapy with TCHPx2, GCHPx1, CHPx1, HPx2 , complete pathologic response with only residual DCIS 7 cm remaining. Anastrozole started 08/19/2014 stopped 09/18/2014, Herceptin stopped April 2016   Neuropathy: Slowly improving with time Tobacco use   Severe Back Pain: Follows with orthopedics Cts and Bone Scan 03/20/15: Normal, she has profound arthritis and symmetrically in all joints. Pain issues being managed by orthopedics.   Breast cancer surveillance: 1. mammogram 10/03/2022 left breast: Benign breast density category B  ----------------------------------------------------------  CT chest for lung cancer screening: Left upper lobe nodule as well as right lung nodules PET CT scan 10/28/2022: Hypermetabolic left upper lobe mass (3.1 x 4.6 cm) with hypermetabolic nodules in the right hemithorax (11 mm) and hypermetabolic ipsilateral mediastinal lymph node (AP window lymph node 5 mm).  Hypermetabolic right parotid node (15 mm), small hypermetabolic level 2 cervical lymph nodes 5 mm  Recommendation: Referral to pulmonary for bronchoscopy and biopsy. Depending on pathology, I may refer the patient to Dr. Arbutus Ped for lung cancer care.   Return to clinic in 1 year for follow-up

## 2022-11-08 ENCOUNTER — Encounter: Payer: Self-pay | Admitting: Hematology and Oncology

## 2022-11-11 NOTE — Progress Notes (Signed)
[name] Big, MD  [name]; [name] Procedure Requests Approved for 1.) US guided core biopsy of right parotid nodule 2.) CT guided bx left anterior subpleural chest mass   HKM

## 2022-11-12 ENCOUNTER — Encounter: Payer: Self-pay | Admitting: Hematology and Oncology

## 2022-11-13 ENCOUNTER — Telehealth: Payer: Self-pay | Admitting: *Deleted

## 2022-11-13 ENCOUNTER — Telehealth: Payer: Self-pay | Admitting: Hematology and Oncology

## 2022-11-13 ENCOUNTER — Other Ambulatory Visit: Payer: Self-pay | Admitting: *Deleted

## 2022-11-13 DIAGNOSIS — Z170 Estrogen receptor positive status [ER+]: Secondary | ICD-10-CM

## 2022-11-13 MED ORDER — PREDNISONE 20 MG PO TABS: 40.0000 mg | ORAL_TABLET | Freq: Every day | ORAL | 0 refills | Status: DC

## 2022-11-13 NOTE — Telephone Encounter (Signed)
Scheduled appointment per staff message. Patient is aware of the made appointment. 

## 2022-11-13 NOTE — Telephone Encounter (Signed)
[name] called. Stated she is having some of worst pain she has ever had saying she cried off and on all day Sunday. She said it's been a week since Dr. Pamelia Hoit gave her medicine, but that it isn't helping. She describes 10/10 when meds wear off. Notes it is worse in feet, hands and neck. States she is able to complete her own ADLs, but that hard to hold things with hands. She said this does not feel like any RA flare she's ever had and that she doesn't know if prednisone is helping. When asked if pain med helping, she states she is trying to just take 2 tablets a day and they aren't helping much. Advised her she can take up to 3 tablets a day of current prescription.   Dr. Pamelia Hoit informed of patient's concerns and asked that patient be contacted with following info: Prednisone increased to  40 mg once a day (new rx for 20 mg tablets sent to pharmacy). She can increase percocet to  2 tablets every 8 hrs.  He would like to see her in 7-10 days.  Contacted patient with this information and directions. She verbalized understanding of all information.

## 2022-11-15 ENCOUNTER — Telehealth: Payer: Self-pay | Admitting: Pulmonary Disease

## 2022-11-15 NOTE — Telephone Encounter (Signed)
Per Dr. Tonia Brooms, pt will need to be seen next week due to abnormal PET on 6/6 and discuss biopsy.   Called and left VM for patient. She can be scheduled with Kandice Robinsons NP on 7/2 morning hold spot.

## 2022-11-18 NOTE — Telephone Encounter (Signed)
Called and left a VM for pt to be scheduled with SG on 7/2.

## 2022-11-19 ENCOUNTER — Telehealth: Payer: Self-pay | Admitting: Hematology and Oncology

## 2022-11-19 NOTE — Telephone Encounter (Signed)
Scheduled appointment per secure chat. Patient is aware of the made appointment. 

## 2022-11-20 ENCOUNTER — Inpatient Hospital Stay: Payer: Medicare HMO | Attending: Hematology and Oncology | Admitting: Hematology and Oncology

## 2022-11-20 DIAGNOSIS — C50411 Malignant neoplasm of upper-outer quadrant of right female breast: Secondary | ICD-10-CM | POA: Diagnosis not present

## 2022-11-20 DIAGNOSIS — Z170 Estrogen receptor positive status [ER+]: Secondary | ICD-10-CM | POA: Diagnosis not present

## 2022-11-20 DIAGNOSIS — Z79811 Long term (current) use of aromatase inhibitors: Secondary | ICD-10-CM | POA: Insufficient documentation

## 2022-11-20 MED ORDER — OXYCODONE-ACETAMINOPHEN 5-325 MG PO TABS: 2.0000 | ORAL_TABLET | Freq: Three times a day (TID) | ORAL | 0 refills | Status: DC | PRN

## 2022-11-20 NOTE — Assessment & Plan Note (Signed)
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 Status post neoadjuvant chemotherapy with TCHPx2, GCHPx1, CHPx1, HPx2 , complete pathologic response with only residual DCIS 7 cm remaining. Anastrozole started 08/19/2014 stopped 09/18/2014, Herceptin stopped April 2016   PET CT scan 10/28/2022: Hypermetabolic left upper lobe mass (3.1 x 4.6 cm) with hypermetabolic nodules in the right hemithorax (11 mm) and hypermetabolic ipsilateral mediastinal lymph node (AP window lymph node 5 mm). Hypermetabolic right parotid node (15 mm), small hypermetabolic level 2 cervical lymph nodes 5 mm  ------------------------------------------------------------------------ Plan: Interventional radiology to biopsy the parotid lesion Pulmonary to biopsy the lung  Current treatment: Letrozole Letrozole toxicities:  Pain issues: Currently on prednisone 40 mg daily for rheumatoid arthritis along with Percocets.

## 2022-11-20 NOTE — Progress Notes (Signed)
HEMATOLOGY-ONCOLOGY TELEPHONE VISIT PROGRESS NOTE  I connected with our patient on 11/20/22 at  3:30 PM EDT by telephone and verified that I am speaking with the correct person using two identifiers.  I discussed the limitations, risks, security and privacy concerns of performing an evaluation and management service by telephone and the availability of in person appointments.  I also discussed with the patient that there may be a patient responsible charge related to this service. The patient expressed understanding and agreed to proceed.   History of Present Illness: Diffuse pains are much better on percocet.  Oncology History  Breast cancer of upper-outer quadrant of right female breast (HCC)  12/16/2013 Mammogram   1. A palpable 4.1 cm diameter mass/asymmetry with associated coarse heterogeneous microcalcifications at 11 o'clock 2 cm from the right nipple demonstrates mammographic and sonographic features highly suggestive of malignancy.    12/16/2013 Initial Biopsy   Breast cancer of upper-outer quadrant of right female breast; Invasive Ductal Cancer Er 100%, PR: 0%; Her2 Positive Ratio 4.28: Ki 67:24%;    12/23/2013 Breast MRI   Right Breast 4.4 x 3 x 2.5 cm and a sub cm satellite lesion. No LN   01/28/2014 - 05/27/2014 Neo-Adjuvant Chemotherapy   TC HP x2 cycles ( developed hand-foot syndrome) GC HP x1 cycle (developed dehydration and renal failure) CHP from 04/08/2014   07/21/2014 - 09/01/2014 Chemotherapy   Herceptin maintenance therapy   07/28/2014 Surgery   Right breast mastectomy: DCIS 7 cm, 4 lymph nodes negative, no invasive breast cancer was identified, complete pathologic response   08/19/2014 - 04/20/2015 Anti-estrogen oral therapy   Anastrozole 1 mg daily stopped 09/23/2014 due to diffuse body aches, diarrhea, nausea, fatigue we switched her to tamoxifen in 03/22/2015 stopped after 2 weeks with diarrhea, tiredness and nausea     REVIEW OF SYSTEMS:   Constitutional: Denies fevers,  chills or abnormal weight loss All other systems were reviewed with the patient and are negative. 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 Status post neoadjuvant chemotherapy with TCHPx2, GCHPx1, CHPx1, HPx2 , complete pathologic response with only residual DCIS 7 cm remaining. Anastrozole started 08/19/2014 stopped 09/18/2014, Herceptin stopped April 2016   PET CT scan 10/28/2022: Hypermetabolic left upper lobe mass (3.1 x 4.6 cm) with hypermetabolic nodules in the right hemithorax (11 mm) and hypermetabolic ipsilateral mediastinal lymph node (AP window lymph node 5 mm). Hypermetabolic right parotid node (15 mm), small hypermetabolic level 2 cervical lymph nodes 5 mm  ------------------------------------------------------------------------ Plan: Interventional radiology to biopsy the parotid lesion Pulmonary to biopsy the lung  Current treatment: Letrozole Letrozole toxicities: tolerating it well  Pain issues: Currently on prednisone 10 mg daily for rheumatoid arthritis along with Percocets. She has palpitations. Pain is better. Sent a prescription for more percocets   I discussed the assessment and treatment plan with the patient. The patient was provided an opportunity to ask questions and all were answered. The patient agreed with the plan and demonstrated an understanding of the instructions. The patient was advised to call back or seek an in-person evaluation if the symptoms worsen or if the condition fails to improve as anticipated.   I provided 12 minutes of non-face-to-face time during  this encounter.  This includes time for charting and coordination of care   Tamsen Meek, MD

## 2022-11-28 ENCOUNTER — Other Ambulatory Visit (HOSPITAL_COMMUNITY): Payer: Self-pay | Admitting: Student

## 2022-11-28 NOTE — H&P (Signed)
Chief Complaint: Patient was seen in consultation today for parotid and subpleural mass.  Referring Physician(s): Serena Croissant  Supervising Physician: Roanna Banning  Patient Status: [facility] - Out-pt  History of Present Illness: [name] is a 67 y.o. female with a past medical history significant for anxiety, depression, GERD, RA, SVT, breast cancer who presents today for right parotid mass and left anterior subpleural mass biopsy. [name] was diagnosed with right breast cancer in 2015 and underwent right mastectomy and chemotherapy which she completed in 2016. She is followed by oncology and underwent PET scan 10/28/22 which showed:  1. Hypermetabolic left upper lobe mass with hypermetabolic nodules in the right hemithorax and a hypermetabolic ipsilateral mediastinal lymph node. Findings are worrisome for T2bN2M1a or stage IVA disease. If the hypermetabolic left cervical lymph nodes are involved, findings would be compatible with stage IVB disease. Difficult to definitively exclude an infectious/inflammatory process but considered less likely. Comparison with reported lung cancer screening CT performed at [facility] would be helpful in this regard. 2. Hypermetabolic right parotid nodule, malignancy cannot be excluded. 3. Aortic atherosclerosis (ICD10-I70.0). Coronary artery calcification. 4.  Emphysema (ICD10-J43.9).  She presents today for right parotid mass and left anterior subpleural mass biopsy.   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), hypoglycemia 2/04-resolved with PO calcium + D, SVT, was treated with iterferon and ribavirin-10/3-2/04   Personal history of chemotherapy    PUD (peptic ulcer disease)    Rheumatoid arthritis(714.0)    possible   SVT (supraventricular tachycardia)    Wears glasses     Past Surgical History:  Procedure Laterality Date   ABDOMINAL HYSTERECTOMY     BREAST SURGERY N/A    Phreesia 03/21/2020   CESAREAN SECTION  09/17/1980   CESAREAN SECTION N/A    Phreesia 03/21/2020   COLONOSCOPY     DILATION AND CURETTAGE OF UTERUS     hysterectomy-for dysmenorrhea  09/17/2000   JOINT REPLACEMENT N/A    Phreesia 03/21/2020   MASTECTOMY Right 2016   MASTECTOMY W/ SENTINEL NODE BIOPSY Right 07/28/2014   Procedure: RIGHT MASTECTOMY WITH SENTINEL LYMPH NODE BIOPSY ;  Surgeon: Almond Lint, MD;  Location: [facility];  Service: General;  Laterality: Right;   NASAL SINUS SURGERY  03/20/1984   for deviated septum   PORTACATH PLACEMENT Left 01/20/2014   Procedure: INSERTION PORT-A-CATH;  Surgeon: Almond Lint, MD;  Location: [facility];  Service: General;  Laterality: Left;   TONSILLECTOMY  05/20/1961   UPPER GI ENDOSCOPY      Allergies: Codeine  Medications: Prior to Admission medications   Medication Sig Start Date End Date Taking? Authorizing Provider  ALPRAZolam Prudy Feeler) 0.5 MG tablet Take by mouth. 10/29/22 11/28/22  [provider]  atenolol (TENORMIN) 50 MG tablet TAKE 1 TABLET(50 MG) BY MOUTH TWICE DAILY 09/11/22   Lewayne Bunting, MD  gabapentin (NEURONTIN) 600 MG tablet Take by mouth. 03/20/21   [provider]  ibuprofen (ADVIL,MOTRIN) 600 MG tablet Take 1 tablet (600 mg total) by mouth every 6 (six) hours as needed. Patient  taking differently: Take 200 mg by mouth every 6 (six) hours as needed. 02/03/17   Audry Pili, PA-C  letrozole [facility]) 2.5 MG tablet Take 1 tablet (2.5 mg total) by mouth daily. 11/07/22   Serena Croissant, MD  oxyCODONE-acetaminophen (PERCOCET/ROXICET) 5-325 MG tablet Take 2 tablets by mouth every 8 (eight) hours as needed for severe pain. 11/20/22   Serena Croissant, MD  pantoprazole (PROTONIX) 40 MG injection  11/03/22   [provider]  predniSONE (DELTASONE) 20 MG tablet Take 2 tablets (40 mg total) by mouth daily with breakfast. 11/13/22   Serena Croissant, MD     Family History  Problem Relation Age of Onset   Dementia Mother    Hypertension Mother    Hyperlipidemia Mother    Cancer Father 10       lung   Dementia Father    Breast cancer Maternal Aunt    Cancer Paternal Uncle    Breast cancer Cousin    Colon cancer Neg Hx     Social History   Socioeconomic History   Marital status: Divorced    Spouse name: Not on file   Number of children: Not on file   Years of education: Not on file   Highest education level: Not on file  Occupational History   Occupation: disability  Tobacco Use   Smoking status: Every Day    Current packs/day: 1.00    Average packs/day: 1 pack/day for 45.0 years (45.0 ttl pk-yrs)    Types: Cigarettes   Smokeless tobacco: Never   Tobacco comments:    2 ppd since 67 yo   Vaping Use   Vaping status: Never Used  Substance and Sexual Activity   Alcohol use: Yes    Alcohol/week: 0.0 standard drinks of alcohol    Comment: denies; reports 3-4 beers/night to Dr. Normand Sloop with 6 pack on weekend; hx of 12 pack/day EtOH abuse in 93    Drug use: No    Comment: had perscription drug abuse problem in 1993; previous IVDU/inranasal cocaine in her 20s-contracted   Sexual activity: Not Currently    Birth control/protection: Surgical  Other Topics Concern   Not on file  Social History Narrative   Divorced, lives with son [name] (born 32); parents nearby.  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    Hepatitis B & C   Has 2 dogs and 5 cats   Did inpatient substance abuse rehab in 1993         Social Determinants of Health   Financial Resource Strain: Low Risk  (07/11/2022)   Received from [facility], [facility]   Overall Financial Resource Strain (CARDIA)    Difficulty of Paying Living Expenses: Not hard at all  Food Insecurity: No Food Insecurity (07/11/2022)   Received from [facility], [facility]   Hunger Vital Sign    Worried About Running Out of Food in the Last Year: Never true    Ran Out of Food in the Last Year: Never true  Transportation Needs: No Transportation Needs (07/11/2022)   Received from [facility], [facility]  PRAPARE - Administrator, Civil Service (Medical): No    Lack of Transportation (Non-Medical): No  Physical Activity: Unknown (07/11/2022)   Received from [facility], [facility]   Exercise Vital Sign    Days of Exercise per Week: 0 days    Minutes of Exercise per Session: Not on file  Stress: No Stress Concern Present (07/11/2022)   Received from [facility], [facility] - Occupational Stress Questionnaire    Feeling of Stress : Only a little  Social Connections: Socially Integrated (07/11/2022)   Received from [facility], [facility]   Social Network    How would you rate your social network (family, work, friends)?: Good participation with social networks     Review of Systems: A 12 point ROS discussed and pertinent positives are indicated in the HPI above.  All other systems are negative.  Review of Systems  Constitutional:  Negative for chills and fever.  Respiratory:  Negative for cough and shortness of breath.   Cardiovascular:  Negative for chest pain.  Gastrointestinal:  Negative for abdominal pain, diarrhea, nausea and vomiting.  Musculoskeletal:  Positive for arthralgias,  joint swelling and myalgias. Negative for back pain.  Neurological:  Negative for dizziness and headaches.    Vital Signs: BP 112/69 (BP Location: Left Arm)   Pulse 69   Temp 97.9 F (36.6 C) (Oral)   Resp 16   SpO2 97%   Physical Exam Vitals reviewed.  Constitutional:      General: She is not in acute distress. HENT:     Head: Normocephalic.     Mouth/Throat:     Mouth: Mucous membranes are moist.     Pharynx: Oropharynx is clear. No oropharyngeal exudate or posterior oropharyngeal erythema.  Cardiovascular:     Rate and Rhythm: Normal rate and regular rhythm.  Pulmonary:     Effort: Pulmonary effort is normal.     Breath sounds: Normal breath sounds.  Abdominal:     General: There is no distension.     Palpations: Abdomen is soft.  Skin:    General: Skin is warm and dry.  Neurological:     Mental Status: She is alert and oriented to person, place, and time.  Psychiatric:        Mood and Affect: Mood normal.        Behavior: Behavior normal.        Thought Content: Thought content normal.        Judgment: Judgment normal.      MD Evaluation Airway: WNL Heart: WNL Abdomen: WNL Chest/ Lungs: WNL ASA  Classification: 3 Mallampati/Airway Score: Two   Imaging: No results found.  Labs:  CBC: No results for input(s): "WBC", "HGB", "HCT", "PLT" in the last 8760 hours.  COAGS: No results for input(s): "INR", "APTT" in the last 8760 hours.  BMP: No results for input(s): "NA", "K", "CL", "CO2", "GLUCOSE", "BUN", "CALCIUM", "CREATININE", "GFRNONAA", "GFRAA" in the last 8760 hours.  Invalid input(s): "CMP"  LIVER FUNCTION TESTS: No results for input(s): "BILITOT", "AST", "ALT", "ALKPHOS", "PROT", "ALBUMIN" in the last 8760 hours.  TUMOR MARKERS: No results for input(s): "AFPTM", "CEA", "CA199", "CHROMGRNA" in the last 8760 hours.  Assessment and Plan:  67 y/o F with history of right breast cancer s/p right mastectomy and chemotherapy 2015 found to have  new right parotid mass and left subpleural mass who presents today for biopsy of both to further direct care.  Risks  and benefits of parotid mass and left subpleural mass biopsy was discussed with the patient and/or patient's family including, but not limited to bleeding, infection, damage to adjacent structures, pneumothorax with need for possible chest tube placement or low yield requiring additional tests.  All of the questions were answered and there is agreement to proceed.  Consent signed and in chart.   Thank you for this interesting consult.  I greatly enjoyed meeting [name] and look forward to participating in their care.  A copy of this report was sent to the requesting provider on this date.  Electronically Signed: Villa Herb, PA-C 11/28/2022, 8:20 AM   I spent a total of 40 Minutes  in face to face in clinical consultation, greater than 50% of which was counseling/coordinating care for parotid and subpleural mass.

## 2022-11-29 ENCOUNTER — Encounter (HOSPITAL_COMMUNITY): Payer: Self-pay

## 2022-11-29 ENCOUNTER — Ambulatory Visit (HOSPITAL_COMMUNITY): Admission: RE | Admit: 2022-11-29 | Payer: Medicare HMO | Source: Ambulatory Visit

## 2022-11-29 ENCOUNTER — Ambulatory Visit (HOSPITAL_COMMUNITY)
Admission: RE | Admit: 2022-11-29 | Discharge: 2022-11-29 | Disposition: A | Discharge status: Home / Self Care | Payer: Medicare HMO | Source: Ambulatory Visit | Attending: Physician Assistant | Admitting: Physician Assistant

## 2022-11-29 ENCOUNTER — Other Ambulatory Visit: Payer: Self-pay | Admitting: Hematology and Oncology

## 2022-11-29 ENCOUNTER — Ambulatory Visit (HOSPITAL_COMMUNITY)
Admission: RE | Admit: 2022-11-29 | Discharge: 2022-11-29 | Disposition: A | Discharge status: Home / Self Care | Payer: Medicare HMO | Source: Ambulatory Visit | Attending: Hematology and Oncology | Admitting: Hematology and Oncology

## 2022-11-29 ENCOUNTER — Ambulatory Visit (HOSPITAL_COMMUNITY): Payer: Medicare HMO

## 2022-11-29 ENCOUNTER — Other Ambulatory Visit: Payer: Self-pay

## 2022-11-29 VITALS — BP 139/77 | HR 65 | Temp 97.7°F | Resp 16

## 2022-11-29 DIAGNOSIS — F419 Anxiety disorder, unspecified: Secondary | ICD-10-CM | POA: Insufficient documentation

## 2022-11-29 DIAGNOSIS — J439 Emphysema, unspecified: Secondary | ICD-10-CM | POA: Diagnosis not present

## 2022-11-29 DIAGNOSIS — R918 Other nonspecific abnormal finding of lung field: Secondary | ICD-10-CM | POA: Insufficient documentation

## 2022-11-29 DIAGNOSIS — I700 Atherosclerosis of aorta: Secondary | ICD-10-CM | POA: Insufficient documentation

## 2022-11-29 DIAGNOSIS — C50411 Malignant neoplasm of upper-outer quadrant of right female breast: Secondary | ICD-10-CM | POA: Diagnosis not present

## 2022-11-29 DIAGNOSIS — Z170 Estrogen receptor positive status [ER+]: Secondary | ICD-10-CM | POA: Insufficient documentation

## 2022-11-29 DIAGNOSIS — J8410 Pulmonary fibrosis, unspecified: Secondary | ICD-10-CM | POA: Insufficient documentation

## 2022-11-29 DIAGNOSIS — R634 Abnormal weight loss: Secondary | ICD-10-CM

## 2022-11-29 DIAGNOSIS — J984 Other disorders of lung: Secondary | ICD-10-CM | POA: Diagnosis not present

## 2022-11-29 DIAGNOSIS — Z01812 Encounter for preprocedural laboratory examination: Secondary | ICD-10-CM | POA: Insufficient documentation

## 2022-11-29 DIAGNOSIS — C50919 Malignant neoplasm of unspecified site of unspecified female breast: Secondary | ICD-10-CM

## 2022-11-29 MED ORDER — DIPHENHYDRAMINE HCL 50 MG/ML IJ SOLN
INTRAMUSCULAR | Status: AC
  Filled 2022-11-29: qty 1

## 2022-11-29 MED ORDER — HYDROCODONE-ACETAMINOPHEN 5-325 MG PO TABS
ORAL_TABLET | ORAL | Status: AC
  Filled 2022-11-29: qty 1

## 2022-11-29 MED ORDER — FENTANYL CITRATE (PF) 100 MCG/2ML IJ SOLN
INTRAMUSCULAR | Status: AC | PRN
  Administered 2022-11-29: 25 ug via INTRAVENOUS
  Administered 2022-11-29 (×2): 50 ug via INTRAVENOUS
  Administered 2022-11-29: 25 ug via INTRAVENOUS

## 2022-11-29 MED ORDER — MIDAZOLAM HCL 2 MG/2ML IJ SOLN
INTRAMUSCULAR | Status: AC
  Filled 2022-11-29: qty 4

## 2022-11-29 MED ORDER — HYDROCODONE-ACETAMINOPHEN 5-325 MG PO TABS
1.0000 | ORAL_TABLET | ORAL | Status: DC | PRN
  Administered 2022-11-29: 1 via ORAL

## 2022-11-29 MED ORDER — MIDAZOLAM HCL 2 MG/2ML IJ SOLN
INTRAMUSCULAR | Status: AC | PRN
  Administered 2022-11-29 (×2): 1 mg via INTRAVENOUS

## 2022-11-29 MED ORDER — FENTANYL CITRATE (PF) 100 MCG/2ML IJ SOLN
INTRAMUSCULAR | Status: AC
  Filled 2022-11-29: qty 4

## 2022-11-29 MED ORDER — DIPHENHYDRAMINE HCL 50 MG/ML IJ SOLN
INTRAMUSCULAR | Status: AC | PRN
  Administered 2022-11-29: 50 mg via INTRAVENOUS

## 2022-11-29 MED ORDER — LIDOCAINE HCL 1 % IJ SOLN
INTRAMUSCULAR | Status: AC
  Filled 2022-11-29: qty 20

## 2022-11-29 MED ORDER — SODIUM CHLORIDE 0.9 % IV SOLN: INTRAVENOUS | Status: DC

## 2022-11-29 MED ORDER — LIDOCAINE HCL (PF) 1 % IJ SOLN
INTRAMUSCULAR | Status: AC | PRN
  Administered 2022-11-29: 10 mL

## 2022-11-29 NOTE — Discharge Instructions (Signed)
Discharge Instructions:   Please call Interventional Radiology clinic [phone] with any questions or concerns.  You may remove your dressings and shower tomorrow.   Moderate Conscious Sedation, Adult, Care After This sheet gives you information about how to care for yourself after your procedure. Your health care provider may also give you more specific instructions. If you have problems or questions, contact your health care provider. What can I expect after the procedure? After the procedure, it is common to have: Sleepiness for several hours. Impaired judgment for several hours. Difficulty with balance. Vomiting if you eat too soon. Follow these instructions at home: For the time period you were told by your health care provider: Rest. Do not participate in activities where you could fall or become injured. Do not drive or use machinery. Do not drink alcohol. Do not take sleeping pills or medicines that cause drowsiness. Do not make important decisions or sign legal documents. Do not take care of children on your own. Eating and drinking  Follow the diet recommended by your health care provider. Drink enough fluid to keep your urine pale yellow. If you vomit: Drink water, juice, or soup when you can drink without vomiting. Make sure you have little or no nausea before eating solid foods. General instructions Take over-the-counter and prescription medicines only as told by your health care provider. Have a responsible adult stay with you for the time you are told. It is important to have someone help care for you until you are awake and alert. Do not smoke. Keep all follow-up visits as told by your health care provider. This is important. Contact a health care provider if: You are still sleepy or having trouble with balance after 24 hours. You feel light-headed. You keep feeling nauseous or you keep vomiting. You develop a rash. You have a fever. You have redness or  swelling around the IV site. Get help right away if: You have trouble breathing. You have new-onset confusion at home. Summary After the procedure, it is common to feel sleepy, have impaired judgment, or feel nauseous if you eat too soon. Rest after you get home. Know the things you should not do after the procedure. Follow the diet recommended by your health care provider and drink enough fluid to keep your urine pale yellow. Get help right away if you have trouble breathing or new-onset confusion at home. This information is not intended to replace advice given to you by your health care provider. Make sure you discuss any questions you have with your health care provider. Document Revised: 09/03/2019 Document Reviewed: 04/01/2019 Elsevier Patient Education  2023 [employer]   Needle Biopsy, Care After The following information offers guidance on how to care for yourself after your procedure. Your health care provider may also give you more specific instructions. If you have problems or questions, contact your health care provider. What can I expect after the procedure? After the procedure, it is common to have: Soreness, pain, and tenderness where a tissue sample was taken (biopsy site). Bruising or mild pain at the biopsy site. These symptoms should go away after a few days. Follow these instructions at home: Biopsy site care  Follow instructions from your health care provider about how to take care of your biopsy site. Make sure you: Wash your hands with soap and water for at least 20 seconds before and after you change your bandage (dressing). If soap and water are not available, use hand sanitizer. Know when and how to  change your dressing. Know when to remove your dressing. Check your puncture site every day for signs of infection. Check for: More redness, swelling, or pain. More drainage of fluid or blood. More warmth. Pus or a bad smell. General instructions Rest as  told by your health care provider. Do not take baths, swim, or use a hot tub until your health care provider approves. Ask your health care provider if you may take showers. You may only be allowed to take sponge baths. Take over-the-counter and prescription medicines only as told by your health care provider. Return to your normal activities as told by your health care provider. Ask your health care provider what activities are safe for you. If you have airplane travel scheduled, talk with your health care provider about when it is safe for you to travel by airplane. This is specific to certain biopsy procedures. It is up to you to get the results of your procedure. Ask your health care provider, or the department that is doing the procedure, when your results will be ready. Keep all follow-up visits. You may need to make an appointment to get your biopsy results. Contact a health care provider if: You have a fever. You have more redness, swelling, or pain at the puncture site that lasts longer than a few days. You have more fluid or blood coming from your puncture site. You have pus or a bad smell coming from your puncture site. Your puncture site feels warm to the touch. You have pain that does not get better with medicine. Get help right away if: You have severe bleeding from the puncture site. You have chest pain. You have problems breathing. You cough up blood. You faint. You have a very fast heart rate. These symptoms may be an emergency. Get help right away. Call 911. Do not wait to see if the symptoms will go away. Do not drive yourself to the hospital. Summary After the procedure, it is common to have soreness, bruising, tenderness, or mild pain at the biopsy site. These symptoms should go away in a few days. Check your biopsy site every day for signs of infection, such as more redness, swelling, or pain. Do not take baths, swim, or use a hot tub until your health care provider  approves. Ask your health care provider if you may take showers. Contact a heath care provider if you have more redness, swelling, or pain at the puncture site that lasts longer than a few days. This information is not intended to replace advice given to you by your health care provider. Make sure you discuss any questions you have with your health care provider. Document Revised: 05/02/2021 Document Reviewed: 05/02/2021 Elsevier Patient Education  2023 [employer].

## 2022-11-29 NOTE — Procedures (Signed)
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 A total of 4 samples were obtained. Hemostasis of the tract was achieved using Manual Pressure.   Successful CT-guided biopsy of L lung mass. A total of 3 samples were obtained. Hemostasis of the tract was achieved using Hemostatic Patch.  Plan: 1 Hr post lung Bx CXR.  Bed rest for 1 hours.  See detailed procedure note with images in PACS. The patient tolerated the procedure well without incident or complication and was returned to Recovery in stable condition.    Roanna Banning, MD [facility]   Pager. [phone] Clinic. [phone]

## 2022-11-29 NOTE — Progress Notes (Signed)
Spoke with Dr Milford Cage in regards to review of post CXR and pt discharge. Pt can be discharged. Pt aware to receive immediate help through urgent care or ER if resp difficulty or distress/SOB. Pt verbalized understanding of discharge instruction.

## 2022-11-29 NOTE — Sedation Documentation (Signed)
Blood patch left lung applied

## 2022-11-29 NOTE — Progress Notes (Signed)
1258 Spoke with Dr Milford Cage, ok to give the po pain medication now. 1300 Vicodin 5-325 given po without difficulty.

## 2022-11-29 NOTE — Progress Notes (Signed)
1240 Xray tech over to do xray

## 2022-11-29 NOTE — Progress Notes (Signed)
1250 Page placed to Dr. Milford Cage re: pain management since she is waiting for a post procedure chest xray.

## 2022-12-02 LAB — SURGICAL PATHOLOGY

## 2022-12-02 LAB — AEROBIC/ANAEROBIC CULTURE W GRAM STAIN (SURGICAL/DEEP WOUND)

## 2022-12-02 NOTE — Progress Notes (Signed)
Patient Care Team: Modesta Messing as PCP - General (Physician Assistant) Lewayne Bunting, MD as PCP - Cardiology (Cardiology) Serena Croissant, MD as Consulting Physician (Hematology and Oncology) Almond Lint, MD as Consulting Physician (General Surgery) Lurline Hare, MD as Consulting Physician (Radiation Oncology) Sheran Luz, MD as Consulting Physician (Physical Medicine and Rehabilitation) Jens Som Madolyn Frieze, MD as Consulting Physician (Cardiology)  DIAGNOSIS: No diagnosis found.  SUMMARY OF ONCOLOGIC HISTORY: Oncology History  Breast cancer of upper-outer quadrant of right female breast (HCC)  12/16/2013 Mammogram   1. A palpable 4.1 cm diameter mass/asymmetry with associated coarse heterogeneous microcalcifications at 11 o'clock 2 cm from the right nipple demonstrates mammographic and sonographic features highly suggestive of malignancy.    12/16/2013 Initial Biopsy   Breast cancer of upper-outer quadrant of right female breast; Invasive Ductal Cancer Er 100%, PR: 0%; Her2 Positive Ratio 4.28: Ki 67:24%;    12/23/2013 Breast MRI   Right Breast 4.4 x 3 x 2.5 cm and a sub cm satellite lesion. No LN   01/28/2014 - 05/27/2014 Neo-Adjuvant Chemotherapy   TC HP x2 cycles ( developed hand-foot syndrome) GC HP x1 cycle (developed dehydration and renal failure) CHP from 04/08/2014   07/21/2014 - 09/01/2014 Chemotherapy   Herceptin maintenance therapy   07/28/2014 Surgery   Right breast mastectomy: DCIS 7 cm, 4 lymph nodes negative, no invasive breast cancer was identified, complete pathologic response   08/19/2014 - 04/20/2015 Anti-estrogen oral therapy   Anastrozole 1 mg daily stopped 09/23/2014 due to diffuse body aches, diarrhea, nausea, fatigue we switched her to tamoxifen in 03/22/2015 stopped after 2 weeks with diarrhea, tiredness and nausea     CHIEF COMPLIANT:   INTERVAL HISTORY: [name] is a   ALLERGIES:  is allergic to codeine.  MEDICATIONS:  Current  Outpatient Medications  Medication Sig Dispense Refill   atenolol (TENORMIN) 50 MG tablet TAKE 1 TABLET(50 MG) BY MOUTH TWICE DAILY 180 tablet 3   gabapentin (NEURONTIN) 600 MG tablet Take by mouth.     ibuprofen (ADVIL,MOTRIN) 600 MG tablet Take 1 tablet (600 mg total) by mouth every 6 (six) hours as needed. (Patient taking differently: Take 200 mg by mouth every 6 (six) hours as needed.) 30 tablet 0   letrozole (FEMARA) 2.5 MG tablet Take 1 tablet (2.5 mg total) by mouth daily. 90 tablet 3   oxyCODONE-acetaminophen (PERCOCET/ROXICET) 5-325 MG tablet Take 2 tablets by mouth every 8 (eight) hours as needed for severe pain. 120 tablet 0   pantoprazole (PROTONIX) 40 MG injection      predniSONE (DELTASONE) 20 MG tablet Take 2 tablets (40 mg total) by mouth daily with breakfast. 40 tablet 0   No current facility-administered medications for this visit.    PHYSICAL EXAMINATION: ECOG PERFORMANCE STATUS: {CHL ONC ECOG PS:[id number]}  There were no vitals filed for this visit. There were no vitals filed for this visit.  BREAST:*** No palpable masses or nodules in either right or left breasts. No palpable axillary supraclavicular or infraclavicular adenopathy no breast tenderness or nipple discharge. (exam performed in the presence of a chaperone)  LABORATORY DATA:  I have reviewed the data as listed    Latest Ref Rng & Units 05/19/2021   12:25 PM 03/24/2020   10:41 AM 02/03/2017    1:13 PM  CMP  Glucose 70 - 99 mg/dL 94  409  811   BUN 8 - 23 mg/dL 10  11  <5   Creatinine 0.44 - 1.00 mg/dL 9.14  0.86  0.71   Sodium 135 - 145 mmol/L 133  131  136   Potassium 3.5 - 5.1 mmol/L 3.2  4.8  3.3   Chloride 98 - 111 mmol/L 103  97  97   CO2 22 - 32 mmol/L 19  19  26    Calcium 8.9 - 10.3 mg/dL 8.6  9.3  9.3   Total Protein 6.0 - 8.5 g/dL  7.7    Total Bilirubin 0.0 - 1.2 mg/dL  0.3    Alkaline Phos 44 - 121 IU/L  147    AST 0 - 40 IU/L  14    ALT 0 - 32 IU/L  14      Lab Results  Component  Value Date   WBC 7.7 05/19/2021   HGB 13.0 05/19/2021   HCT 40.3 05/19/2021   MCV 83.1 05/19/2021   PLT 302 05/19/2021   NEUTROABS 4.9 03/24/2020    ASSESSMENT & PLAN:  No problem-specific Assessment & Plan notes found for this encounter.    No orders of the defined types were placed in this encounter.  The patient has a good understanding of the overall plan. she agrees with it. she will call with any problems that may develop before the next visit here. Total time spent: 30 mins including face to face time and time spent for planning, charting and co-ordination of care   Sherlyn Lick, CMA 12/02/22    I Janan Ridge am acting as a Neurosurgeon for [employer]  ***

## 2022-12-04 ENCOUNTER — Inpatient Hospital Stay: Payer: Medicare HMO | Admitting: Hematology and Oncology

## 2022-12-04 ENCOUNTER — Other Ambulatory Visit: Payer: Self-pay

## 2022-12-04 ENCOUNTER — Ambulatory Visit: Payer: Medicare HMO | Admitting: Hematology and Oncology

## 2022-12-04 VITALS — BP 138/67 | HR 67 | Temp 97.5°F | Resp 18 | Ht 65.0 in | Wt 177.8 lb

## 2022-12-04 DIAGNOSIS — C50411 Malignant neoplasm of upper-outer quadrant of right female breast: Secondary | ICD-10-CM | POA: Diagnosis present

## 2022-12-04 DIAGNOSIS — Z170 Estrogen receptor positive status [ER+]: Secondary | ICD-10-CM | POA: Diagnosis not present

## 2022-12-04 DIAGNOSIS — D119 Benign neoplasm of major salivary gland, unspecified: Secondary | ICD-10-CM | POA: Diagnosis not present

## 2022-12-04 DIAGNOSIS — Z79811 Long term (current) use of aromatase inhibitors: Secondary | ICD-10-CM | POA: Diagnosis not present

## 2022-12-04 LAB — AEROBIC/ANAEROBIC CULTURE W GRAM STAIN (SURGICAL/DEEP WOUND)

## 2022-12-04 LAB — SURGICAL PATHOLOGY

## 2022-12-04 NOTE — Assessment & Plan Note (Signed)
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 Status post neoadjuvant chemotherapy with TCHPx2, GCHPx1, CHPx1, HPx2 , complete pathologic response with only residual DCIS 7 cm remaining. Anastrozole started 08/19/2014 stopped 09/18/2014, Herceptin stopped April 2016    PET CT scan 10/28/2022: Hypermetabolic left upper lobe mass (3.1 x 4.6 cm) with hypermetabolic nodules in the right hemithorax (11 mm) and hypermetabolic ipsilateral mediastinal lymph node (AP window lymph node 5 mm). 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

## 2022-12-06 ENCOUNTER — Ambulatory Visit: Payer: Medicare HMO | Admitting: Emergency Medicine

## 2022-12-06 ENCOUNTER — Telehealth (HOSPITAL_COMMUNITY): Payer: Self-pay | Admitting: *Deleted

## 2022-12-06 ENCOUNTER — Encounter: Payer: Self-pay | Admitting: Emergency Medicine

## 2022-12-06 VITALS — BP 124/74 | HR 74 | Temp 98.4°F | Ht 64.0 in | Wt 178.4 lb

## 2022-12-06 DIAGNOSIS — M069 Rheumatoid arthritis, unspecified: Secondary | ICD-10-CM | POA: Diagnosis not present

## 2022-12-06 DIAGNOSIS — J691 Pneumonitis due to inhalation of oils and essences: Secondary | ICD-10-CM | POA: Diagnosis not present

## 2022-12-06 DIAGNOSIS — J449 Chronic obstructive pulmonary disease, unspecified: Secondary | ICD-10-CM

## 2022-12-06 DIAGNOSIS — J8489 Other specified interstitial pulmonary diseases: Secondary | ICD-10-CM

## 2022-12-06 NOTE — Assessment & Plan Note (Signed)
She underwent bx of her LUL mass, was treated for clinical PNA. Results showed lipoid PNA. Aspiration is a potential cause. Also, she continues to vape, which can cause lipoid PNA. I cautioned her about this. She has been treated w abx. I believe we need to re-image to insure that this lesion is shrinking / resolved. If not, we may need to reconsider a repeat bx. I will order a modified barium swallow and speech eval regarding suspected aspiration.

## 2022-12-06 NOTE — Telephone Encounter (Signed)
Attempted to contact patient to schedule OP MBS. Left VM. RKEEL 

## 2022-12-06 NOTE — Addendum Note (Signed)
Addended by: Dorisann Frames R on: 12/06/2022 11:32 AM   Modules accepted: Orders

## 2022-12-06 NOTE — Assessment & Plan Note (Signed)
On Pred 10mg  and no longer has a Rheumatologist. I will ask her to go see Alease Medina at [facility]

## 2022-12-06 NOTE — Assessment & Plan Note (Signed)
Presumed COPD based on her daily sx, tobacco hx. She is not on BD therapy, but suspect she will need to be. We will start w PFT, quantify her obstruction and then discuss meds.

## 2022-12-06 NOTE — Patient Instructions (Addendum)
We reviewed your CT chest, PET scan and biopsy results today.  We will repeat a CT chest in mid August.  Depending on your CT results we will decide whether there is any indication to repeat a biopsy of your left upper lobe We will refer you to Speech Therapy and for a modified barium swallow to evaluate for aspiration You need to try to stop vaping - this is putting you at risk for lipoid pneumonia We will perform pulmonary function testing We will refer you to see Alease Medina at [facility] to re-establish care for your rheumatoid arthritis.  Follow with Dr Delton Coombes in August after your CT chest so we can review

## 2022-12-06 NOTE — Progress Notes (Signed)
Subjective:    Patient ID: [name], female    DOB: [birth year], 67 y.o.   MRN: [id number]  HPI 61 F, active smoker (100 pack-years), hx of R breast CA (Neo-A chemo and then sgy 2015-16, on letrozole), ? RA on prednisone 10mg , depression, GERD, Hep C. Followed by Dr Pamelia Hoit. She had a PET 10/28/22 that showed a LUL mass, 15mm R parotid nodule, some cervical LAD. She had been experiencing cough with purulent sputum, but this was improving by the time they ultimately treated her with a course of abx. She feels back to baseline - minimal mucous or cough. She does occasionally have some aspiration sx intermittently w meals.   Then underwent recent needle biopsies of parotid gland and lung. The Parotid bx showed a Warthins tumor. The lung biopsy showed lipoid PNA. She was treated   Lung CA screening CT 09/26/22 ([facility]) showed stable RLL nodule, 6mm LUL nodule slightly increased in size from 5mm. New 8mm medial RLL nodule. New anterior LUL irregular mass 3.7cm  PET scan 10/24/22 reviewed by me shows small AP window node, 3.1 x 4.6cm LUL mass, scattered hypermetabolic nodules in the R hemithorax, 8 x 11mm medial RLL  LUL needle bx path >> benign acute and chronic inflammation, histiocytes suggestive of lipoid PNA. No malignancy   Review of Systems As per HPI  Past Medical History:  Diagnosis Date   Allergy    Phreesia 03/21/2020   Anemia    Phreesia 03/21/2020   Anxiety    Arthritis    Phreesia 03/21/2020   Blood transfusion without reported diagnosis    Phreesia 03/21/2020   Cancer (HCC)    right breast cancer   Depression    Depression    Phreesia 03/21/2020   Fibromyalgia    GERD (gastroesophageal reflux disease)    Phreesia 03/21/2020   Heavy smoker    Hepatitis C    Pt was given too much interferon - critical care x2d, 5 days total hosp   Neuromuscular disorder (HCC)    Phreesia 03/21/2020   Osteoporosis    Perimenopausal 11/18/2003   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 from  hep meds-2/04. 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), hypoglycemia 2/04-resolved with PO calcium + D, SVT, was treated with iterferon and ribavirin-10/3-2/04   Personal history of chemotherapy    PUD (peptic ulcer disease)    Rheumatoid arthritis(714.0)    possible   SVT (supraventricular tachycardia)    Wears glasses      Family History  Problem Relation Age of Onset   Dementia Mother    Hypertension Mother    Hyperlipidemia Mother    Cancer Father 7       lung   Dementia Father    Breast cancer Maternal Aunt    Cancer Paternal Uncle    Breast cancer Cousin    Colon cancer Neg Hx      Social History   Socioeconomic History   Marital status: Divorced    Spouse name: Not on file   Number of children: Not on file   Years of education: Not on file   Highest education level: Not on file  Occupational History   Occupation: disability  Tobacco Use   Smoking status: Every Day    Current packs/day: 1.00    Average packs/day: 1 pack/day for 45.0 years (45.0 ttl pk-yrs)    Types: Cigarettes   Smokeless tobacco: Never   Tobacco comments:    Pt is currently vapping ARJ 12/06/22  Vaping Use  Vaping status: Never Used  Substance and Sexual Activity   Alcohol use: Yes    Alcohol/week: 0.0 standard drinks of alcohol    Comment: denies; reports 3-4 beers/night to Dr. Normand Sloop with 6 pack on weekend; hx of 12 pack/day EtOH abuse in 93    Drug use: No    Comment: had perscription drug abuse problem in 1993; previous IVDU/inranasal cocaine in her 20s-contracted   Sexual activity: Not Currently    Birth control/protection: Surgical  Other Topics Concern   Not on file  Social History Narrative   Divorced, lives with son [name] (born 62); parents nearby. 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    Hepatitis B & C   Has 2 dogs and 5 cats   Did inpatient substance abuse rehab in 1993         Social Determinants of Health   Financial  Resource Strain: Low Risk  (07/11/2022)   Received from [facility], [facility]   Overall Financial Resource Strain (CARDIA)    Difficulty of Paying Living Expenses: Not hard at all  Food Insecurity: No Food Insecurity (07/11/2022)   Received from [facility], [facility]   Hunger Vital Sign    Worried About Running Out of Food in the Last Year: Never true    Ran Out of Food in the Last Year: Never true  Transportation Needs: No Transportation Needs (07/11/2022)   Received from [facility], [facility]   PRAPARE - Transportation    Lack of Transportation (Medical): No    Lack of Transportation (Non-Medical): No  Physical Activity: Unknown (07/11/2022)   Received from [facility], [facility]   Exercise Vital Sign    Days of Exercise per Week: 0 days    Minutes of Exercise per Session: Not on file  Stress: No Stress Concern Present (07/11/2022)   Received from [facility], [facility] - Occupational Stress Questionnaire    Feeling of Stress : Only a little  Social Connections: Socially Integrated (07/11/2022)   Received from [facility], [facility]   Social Network    How would you rate your social network (family, work, friends)?: Good participation with social networks  Intimate Partner Violence: Not At Risk (07/11/2022)   Received from [facility], [facility]   HITS    Over the last 12 months how often did your partner physically hurt you?: 1    Over the last 12 months how often did your partner insult you or talk down to you?: 1    Over the last 12 months how often did your partner threaten you with physical harm?: 1    Over the last 12 months how often did your partner scream or curse at you?: 1     Allergies  Allergen Reactions   Codeine Itching and Nausea Only    Pt has had codeine with pre-administration to prevent reaction without any problem.      Outpatient Medications Prior to Visit  Medication  Sig Dispense Refill   ALPRAZolam (XANAX) 0.5 MG tablet TAKE 1 TABLET(0.5 MG) BY MOUTH 1 TIME AS NEEDED FOR ANXIETY. MAX DAILY AMOUNT: 0.5 MG     atenolol (TENORMIN) 50 MG tablet TAKE 1 TABLET(50 MG) BY MOUTH TWICE DAILY 180 tablet 3   gabapentin (NEURONTIN) 600 MG tablet Take by mouth.     ibuprofen (ADVIL,MOTRIN) 600 MG tablet Take 1 tablet (600 mg total) by mouth  every 6 (six) hours as needed. (Patient taking differently: Take 200 mg by mouth every 6 (six) hours as needed.) 30 tablet 0   letrozole (FEMARA) 2.5 MG tablet Take 1 tablet (2.5 mg total) by mouth daily. 90 tablet 3   oxyCODONE-acetaminophen (PERCOCET/ROXICET) 5-325 MG tablet Take 2 tablets by mouth every 8 (eight) hours as needed for severe pain. 120 tablet 0   pantoprazole (PROTONIX) 40 MG injection      predniSONE (DELTASONE) 20 MG tablet Take 2 tablets (40 mg total) by mouth daily with breakfast. 40 tablet 0   No facility-administered medications prior to visit.        Objective:   Physical Exam Vitals:   12/06/22 1033  BP: 124/74  Pulse: 74  Temp: 98.4 F (36.9 C)  TempSrc: Oral  SpO2: 97%  Weight: 178 lb 6.4 oz (80.9 kg)  Height: 5\' 4"  (1.626 m)    Gen: Pleasant, overwt, in no distress,  normal affect  ENT: No lesions,  mouth clear,  oropharynx clear, no postnasal drip  Neck: No JVD, no stridor  Lungs: No use of accessory muscles, coarse B, no wheeze  Cardiovascular: RRR, heart sounds normal, no murmur or gallops, no peripheral edema  Musculoskeletal: No deformities, no cyanosis or clubbing  Neuro: alert, awake, non focal  Skin: Warm, no lesions or rash       Assessment & Plan:   Lipoid pneumonia (HCC) She underwent bx of her LUL mass, was treated for clinical PNA. Results showed lipoid PNA. Aspiration is a potential cause. Also, she continues to vape, which can cause lipoid PNA. I cautioned her about this. She has been treated w abx. I believe we need to re-image to insure that this lesion is  shrinking / resolved. If not, we may need to reconsider a repeat bx. I will order a modified barium swallow and speech eval regarding suspected aspiration.   COPD (chronic obstructive pulmonary disease) (HCC) Presumed COPD based on her daily sx, tobacco hx. She is not on BD therapy, but suspect she will need to be. We will start w PFT, quantify her obstruction and then discuss meds.   Rheumatoid arthritis On Pred 10mg  and no longer has a Rheumatologist. I will ask her to go see Alease Medina at [facility]   Levy Pupa, MD, PhD 12/06/2022, 11:24 AM [facility] [phone] or if no answer before 7:00PM call [phone] For any issues after 7:00PM please call eLink [phone]

## 2022-12-09 ENCOUNTER — Other Ambulatory Visit (HOSPITAL_COMMUNITY): Payer: Self-pay | Admitting: *Deleted

## 2022-12-09 DIAGNOSIS — R1310 Dysphagia, unspecified: Secondary | ICD-10-CM

## 2022-12-11 ENCOUNTER — Telehealth: Payer: Self-pay

## 2022-12-11 NOTE — Telephone Encounter (Signed)
Returned Pt's call regarding medication refill. Pt asking for oxycodone refill and new rx for zofran as "tumeric isn't helping my nausea". Advised Pt that MD is out of office for the rest of the day but will address tomorrow. Pt verbalized understanding.  Note left for MD to review.

## 2022-12-12 ENCOUNTER — Other Ambulatory Visit: Payer: Self-pay | Admitting: Hematology and Oncology

## 2022-12-12 MED ORDER — OXYCODONE-ACETAMINOPHEN 5-325 MG PO TABS: 2.0000 | ORAL_TABLET | Freq: Three times a day (TID) | ORAL | 0 refills | Status: DC | PRN

## 2022-12-12 MED ORDER — ONDANSETRON HCL 8 MG PO TABS: 8.0000 mg | ORAL_TABLET | Freq: Three times a day (TID) | ORAL | 3 refills | Status: DC | PRN

## 2022-12-25 ENCOUNTER — Telehealth: Payer: Self-pay

## 2022-12-25 NOTE — Telephone Encounter (Signed)
Pt called to check the status of her referral to ENT. Called the office and s/w Eliberto Ivory who states the office is currently backed up with the amount of referrals they have coming in and are scheduling out to September; however, he will speak with Dr Irene Pap about seeing pt sooner and call pt.  Pt is aware and was given their phone number to f/u.

## 2022-12-26 ENCOUNTER — Encounter (HOSPITAL_COMMUNITY): Payer: Self-pay

## 2022-12-26 ENCOUNTER — Ambulatory Visit (HOSPITAL_COMMUNITY): Payer: Medicare HMO

## 2022-12-26 ENCOUNTER — Ambulatory Visit (HOSPITAL_COMMUNITY)
Admission: RE | Admit: 2022-12-26 | Discharge: 2022-12-26 | Disposition: A | Discharge status: Home / Self Care | Payer: Medicare HMO | Source: Ambulatory Visit | Attending: Physician Assistant | Admitting: Physician Assistant

## 2022-12-26 DIAGNOSIS — J691 Pneumonitis due to inhalation of oils and essences: Secondary | ICD-10-CM

## 2022-12-30 ENCOUNTER — Ambulatory Visit
Admission: RE | Admit: 2022-12-30 | Discharge: 2022-12-30 | Disposition: A | Discharge status: Home / Self Care | Payer: Medicare HMO | Source: Ambulatory Visit | Attending: Emergency Medicine | Admitting: Emergency Medicine

## 2022-12-30 DIAGNOSIS — J691 Pneumonitis due to inhalation of oils and essences: Secondary | ICD-10-CM

## 2022-12-31 ENCOUNTER — Other Ambulatory Visit: Payer: Self-pay | Admitting: Emergency Medicine

## 2022-12-31 DIAGNOSIS — R1310 Dysphagia, unspecified: Secondary | ICD-10-CM

## 2023-01-02 ENCOUNTER — Ambulatory Visit (HOSPITAL_COMMUNITY)
Admission: RE | Admit: 2023-01-02 | Discharge: 2023-01-02 | Disposition: A | Discharge status: Home / Self Care | Payer: Medicare HMO | Source: Ambulatory Visit

## 2023-01-02 ENCOUNTER — Encounter (HOSPITAL_COMMUNITY): Payer: Self-pay

## 2023-01-02 ENCOUNTER — Encounter (HOSPITAL_COMMUNITY): Payer: Medicare HMO

## 2023-01-02 DIAGNOSIS — J449 Chronic obstructive pulmonary disease, unspecified: Secondary | ICD-10-CM | POA: Insufficient documentation

## 2023-01-02 DIAGNOSIS — R1310 Dysphagia, unspecified: Secondary | ICD-10-CM

## 2023-01-02 DIAGNOSIS — K219 Gastro-esophageal reflux disease without esophagitis: Secondary | ICD-10-CM | POA: Diagnosis not present

## 2023-01-02 NOTE — Progress Notes (Signed)
Modified Barium Swallow Study  Patient Details  Name: [name] MRN: [id number] Date of Birth: 1956/02/12  Today's Date: 01/02/2023  Modified Barium Swallow completed.  Full report located under Chart Review in the Imaging Section.  History of Present Illness Pt is a 67 year old female arriving for an OP MBS referred by pulmonology due to following concern " LUL mass, was treated for clinical PNA. Results showed lipoid PNA. Aspiration is a potential cause. Also, she continues to vape, which can cause lipoid PNA." Pt has a history of COPD and complains of nighttime GERD   Clinical Impression Pt demonstrates normal swallow function. No dysphagia or aspiration. Pt denies complaint except for night time reflux. Recommend no SLP f/u, pt to continue diet. Factors that may increase risk of adverse event in presence of aspiration Rubye Oaks & Clearance Coots 2021):    Swallow Evaluation Recommendations Recommendations: PO diet PO Diet Recommendation: Regular;Thin liquids (Level 0) Liquid Administration via: Cup;Straw Medication Administration: Whole meds with liquid Supervision: Patient able to self-feed      , Riley Nearing 01/02/2023,2:05 PM

## 2023-01-03 ENCOUNTER — Telehealth: Payer: Self-pay | Admitting: Hematology and Oncology

## 2023-01-03 NOTE — Telephone Encounter (Signed)
I called the patient and discussed the results of the CT chest and the swallowing study. She has an appointment to speak with Dr. Delton Coombes about the scan results. She would like to get a hospital bed and will request Dr. Delton Coombes if he would be able to write for that. For her acid reflux and Barrett's: She will discuss with gastroenterology to get another endoscopy.

## 2023-01-08 ENCOUNTER — Encounter: Payer: Self-pay | Admitting: Emergency Medicine

## 2023-01-08 ENCOUNTER — Telehealth: Payer: Self-pay

## 2023-01-08 ENCOUNTER — Other Ambulatory Visit: Payer: Self-pay | Admitting: Hematology and Oncology

## 2023-01-08 ENCOUNTER — Ambulatory Visit: Payer: Medicare HMO | Admitting: Emergency Medicine

## 2023-01-08 VITALS — BP 122/72 | HR 66 | Ht 64.0 in | Wt 178.0 lb

## 2023-01-08 DIAGNOSIS — T65291A Toxic effect of other tobacco and nicotine, accidental (unintentional), initial encounter: Secondary | ICD-10-CM | POA: Diagnosis not present

## 2023-01-08 DIAGNOSIS — J691 Pneumonitis due to inhalation of oils and essences: Secondary | ICD-10-CM

## 2023-01-08 DIAGNOSIS — Z87891 Personal history of nicotine dependence: Secondary | ICD-10-CM | POA: Diagnosis not present

## 2023-01-08 DIAGNOSIS — J449 Chronic obstructive pulmonary disease, unspecified: Secondary | ICD-10-CM | POA: Diagnosis not present

## 2023-01-08 MED ORDER — OXYCODONE-ACETAMINOPHEN 5-325 MG PO TABS: 2.0000 | ORAL_TABLET | Freq: Three times a day (TID) | ORAL | 0 refills | Status: DC | PRN

## 2023-01-08 NOTE — Telephone Encounter (Signed)
Pt called in requesting refill for percocet 5-325. She takes 2 tabs PO q8h PRN pain. Advised pt I made MD aware of her request. She verbalized thanks and understanding.

## 2023-01-08 NOTE — Patient Instructions (Signed)
We reviewed your CT scan of the chest.  The areas of lipoid pneumonia are smaller.  Good news.  We will plan to repeat a CT scan of the chest in February 2025 to ensure stability. Agree with increasing your pantoprazole to 40 mg twice a day.  Try to take this medication 1 hour around food. Try avoiding reflux causing foods.  Try to avoid eating after 8 PM.  You might benefit from putting the head of your bed up on 1-1.5 inch blocks Work hard to eliminate vaping.  If you are going to vape then try changing to a water-based device We will obtain pulmonary function testing at your next office visit Follow Dr. Delton Coombes next available with PFT on the same day.

## 2023-01-08 NOTE — Assessment & Plan Note (Signed)
Presumed COPD given her history of tobacco.  PFTs have not yet been scheduled.  We will work on this.  We will obtain pulmonary function testing at your next office visit Follow Dr. Delton Coombes next available with PFT on the same day.

## 2023-01-08 NOTE — Assessment & Plan Note (Signed)
Repeat CT chest was reassuring with the decrease in size of the previously observed left and right sided infiltrates.  I will repeat another scan in 6 months to ensure full resolution.  After that we may not need to continue to do dedicated serial follow-up.  Did speak with her today about the risk associated with lipid vaping, aspiration.  She does have significant GERD and has had what sounds like aspiration and choking of reflux at night.  This may be the cause of her lipoid pneumonia.  She is going to see GI.  In the meantime her PPI was recently increased to twice daily, agree with this.   We reviewed your CT scan of the chest.  The areas of lipoid pneumonia are smaller.  Good news.  We will plan to repeat a CT scan of the chest in February 2025 to ensure stability. Agree with increasing your pantoprazole to 40 mg twice a day.  Try to take this medication 1 hour around food. Try avoiding reflux causing foods.  Try to avoid eating after 8 PM.  You might benefit from putting the head of your bed up on 1-1.5 inch blocks Work hard to eliminate vaping.  If you are going to vape then try changing to a water-based device

## 2023-01-08 NOTE — Progress Notes (Addendum)
Subjective:    Patient ID: [name], female    DOB: Mar 26, 1956, 67 y.o.   MRN: [id number]  HPI  ROV 01/08/2023 --67 year old woman with a history of active tobacco use and presumed COPD, rheumatoid arthritis on prednisone 10mg , history of right breast cancer.  I saw her for a left upper lobe mass lesion associated with purulent sputum, parotid nodule that was consistent with a Warthin's tumor.  She underwent lung biopsy that showed evidence for lipoid pneumonia.  She does continue to vape.  I ordered a barium swallow to evaluate for possible aspiration.  We also ordered PFT but these have not been done. She has GERD at night, has had choking on this before. Planning to increase pantoprazole to bid. Still using lipid vape - looking to change this.  She has some exertional   CT chest 12/30/2022 reviewed by me, shows a decrease in her left upper lobe area of consolidation compared with June.  Right lower lobe lung nodules also decreased in size.  Prevascular hypermetabolic node smaller as well.  Modified barium swallow 01/02/2023 reviewed by me shows a normal functional swallow without any dysphagia or occult aspiration.      Review of Systems As per HPI  Past Medical History:  Diagnosis Date   Allergy    Phreesia 03/21/2020   Anemia    Phreesia 03/21/2020   Anxiety    Arthritis    Phreesia 03/21/2020   Blood transfusion without reported diagnosis    Phreesia 03/21/2020   Cancer (HCC)    right breast cancer   Depression    Depression    Phreesia 03/21/2020   Fibromyalgia    GERD (gastroesophageal reflux disease)    Phreesia 03/21/2020   Heavy smoker    Hepatitis C    Pt was given too much interferon - critical care x2d, 5 days total hosp   Neuromuscular disorder (HCC)    Phreesia 03/21/2020   Osteoporosis    Perimenopausal 11/18/2003   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), hypoglycemia 2/04-resolved with PO  calcium + D, SVT, was treated with iterferon and ribavirin-10/3-2/04   Personal history of chemotherapy    PUD (peptic ulcer disease)    Rheumatoid arthritis(714.0)    possible   SVT (supraventricular tachycardia)    Wears glasses      Family History  Problem Relation Age of Onset   Dementia Mother    Hypertension Mother    Hyperlipidemia Mother    Cancer Father 16       lung   Dementia Father    Breast cancer Maternal Aunt    Cancer Paternal Uncle    Breast cancer Cousin    Colon cancer Neg Hx      Social History   Socioeconomic History   Marital status: Divorced    Spouse name: Not on file   Number of children: Not on file   Years of education: Not on file   Highest education level: Not on file  Occupational History   Occupation: disability  Tobacco Use   Smoking status: Every Day    Current packs/day: 1.00    Average packs/day: 1 pack/day for 45.0 years (45.0 ttl pk-yrs)    Types: Cigarettes   Smokeless tobacco: Never   Tobacco comments:    Pt is currently vapping ARJ 12/06/22  Vaping Use   Vaping status: Never Used  Substance and Sexual Activity   Alcohol use: Yes    Alcohol/week:  0.0 standard drinks of alcohol    Comment: denies; reports 3-4 beers/night to Dr. Normand Sloop with 6 pack on weekend; hx of 12 pack/day EtOH abuse in 93    Drug use: No    Comment: had perscription drug abuse problem in 1993; previous IVDU/inranasal cocaine in her 20s-contracted   Sexual activity: Not Currently    Birth control/protection: Surgical  Other Topics Concern   Not on file  Social History Narrative   Divorced, lives with son [name] (born 75); parents nearby. 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    Hepatitis B & C   Has 2 dogs and 5 cats   Did inpatient substance abuse rehab in 1993         Social Determinants of Health   Financial Resource Strain: Medium Risk (01/06/2023)   Received from [facility]   Overall Financial  Resource Strain (CARDIA)    Difficulty of Paying Living Expenses: Somewhat hard  Food Insecurity: No Food Insecurity (01/06/2023)   Received from [facility]   Hunger Vital Sign    Worried About Running Out of Food in the Last Year: Never true    Ran Out of Food in the Last Year: Never true  Transportation Needs: Unmet Transportation Needs (01/06/2023)   Received from [facility] - Transportation    Lack of Transportation (Medical): Yes    Lack of Transportation (Non-Medical): Yes  Physical Activity: Insufficiently Active (01/06/2023)   Received from [facility]   Exercise Vital Sign    Days of Exercise per Week: 1 day    Minutes of Exercise per Session: 20 min  Stress: Stress Concern Present (01/06/2023)   Received from [facility] - Occupational Stress Questionnaire    Feeling of Stress : Rather much  Social Connections: Socially Integrated (01/06/2023)   Received from [facility]   Social Network    How would you rate your social network (family, work, friends)?: Good participation with social networks  Intimate Partner Violence: Not At Risk (01/06/2023)   Received from [facility]   HITS    Over the last 12 months how often did your partner physically hurt you?: 1    Over the last 12 months how often did your partner insult you or talk down to you?: 1    Over the last 12 months how often did your partner threaten you with physical harm?: 1    Over the last 12 months how often did your partner scream or curse at you?: 1     Allergies  Allergen Reactions   Codeine Itching and Nausea Only    Pt has had codeine with pre-administration to prevent reaction without any problem.      Outpatient Medications Prior to Visit  Medication Sig Dispense Refill   ALPRAZolam (XANAX) 0.5 MG tablet TAKE 1 TABLET(0.5 MG) BY MOUTH 1 TIME AS NEEDED FOR ANXIETY. MAX DAILY AMOUNT: 0.5 MG     atenolol (TENORMIN) 50 MG tablet TAKE 1  TABLET(50 MG) BY MOUTH TWICE DAILY (Patient taking differently: Take 50 mg by mouth daily. TAKE 2 TABLET(50 MG) BY MOUTH TWICE DAILY) 180 tablet 3   gabapentin (NEURONTIN) 600 MG tablet Take 600 mg by mouth 3 (three) times daily.     ibuprofen (ADVIL,MOTRIN) 600 MG tablet Take 1 tablet (600 mg total) by mouth every 6 (six) hours as needed. (Patient taking differently: Take 200 mg by mouth  every 6 (six) hours as needed.) 30 tablet 0   letrozole (FEMARA) 2.5 MG tablet Take 1 tablet (2.5 mg total) by mouth daily. 90 tablet 3   ondansetron (ZOFRAN) 8 MG tablet Take 1 tablet (8 mg total) by mouth every 8 (eight) hours as needed for nausea or vomiting. 30 tablet 3   oxyCODONE-acetaminophen (PERCOCET/ROXICET) 5-325 MG tablet Take 2 tablets by mouth every 8 (eight) hours as needed for severe pain. 180 tablet 0   pantoprazole (PROTONIX) 40 MG injection      predniSONE (DELTASONE) 20 MG tablet Take 2 tablets (40 mg total) by mouth daily with breakfast. (Patient taking differently: Take 20 mg by mouth daily with breakfast.) 40 tablet 0   No facility-administered medications prior to visit.        Objective:   Physical Exam Vitals:   01/08/23 0932  BP: 122/72  Pulse: 66  SpO2: 99%  Weight: 178 lb (80.7 kg)  Height: 5\' 4"  (1.626 m)    Gen: Pleasant, overwt, in no distress,  normal affect  ENT: No lesions,  mouth clear,  oropharynx clear, no postnasal drip  Neck: No JVD, no stridor  Lungs: No use of accessory muscles, coarse B, no wheeze  Cardiovascular: RRR, heart sounds normal, no murmur or gallops, no peripheral edema  Musculoskeletal: No deformities, no cyanosis or clubbing  Neuro: alert, awake, non focal  Skin: Warm, no lesions or rash       Assessment & Plan:   Lipoid pneumonia (HCC) Repeat CT chest was reassuring with the decrease in size of the previously observed left and right sided infiltrates.  I will repeat another scan in 6 months to ensure full resolution.  After that  we may not need to continue to do dedicated serial follow-up.  Did speak with her today about the risk associated with lipid vaping, aspiration.  She does have significant GERD and has had what sounds like aspiration and choking of reflux at night.  This may be the cause of her lipoid pneumonia.  She is going to see GI.  In the meantime her PPI was recently increased to twice daily, agree with this.   We reviewed your CT scan of the chest.  The areas of lipoid pneumonia are smaller.  Good news.  We will plan to repeat a CT scan of the chest in February 2025 to ensure stability. Agree with increasing your pantoprazole to 40 mg twice a day.  Try to take this medication 1 hour around food. Try avoiding reflux causing foods.  Try to avoid eating after 8 PM.  You might benefit from putting the head of your bed up on 1-1.5 inch blocks Work hard to eliminate vaping.  If you are going to vape then try changing to a water-based device  COPD (chronic obstructive pulmonary disease) (HCC) Presumed COPD given her history of tobacco.  PFTs have not yet been scheduled.  We will work on this.  We will obtain pulmonary function testing at your next office visit Follow Dr. Delton Coombes next available with PFT on the same day.    Levy Pupa, MD, PhD 01/08/2023, 10:58 AM [facility] [phone] or if no answer before 7:00PM call [phone] For any issues after 7:00PM please call eLink [phone]   Addendum:  Based on the patient's age, medical problems, history of tobacco and suspected COPD, she is at moderate risk for general anesthesia, surgical interventions.  She should be at low to moderate risk for conscious sedation  for planned colonoscopy in November.  There are no contraindications to proceeding as long as she is not having flaring symptoms of cough, dyspnea, wheezing.  Levy Pupa, MD, PhD 02/04/2023, 4:33 PM [facility] [phone] or if no answer  before 7:00PM call [phone] For any issues after 7:00PM please call eLink [phone]

## 2023-01-09 ENCOUNTER — Encounter: Payer: Self-pay | Admitting: Emergency Medicine

## 2023-01-14 ENCOUNTER — Other Ambulatory Visit: Payer: Self-pay | Admitting: Hematology and Oncology

## 2023-01-14 DIAGNOSIS — C50411 Malignant neoplasm of upper-outer quadrant of right female breast: Secondary | ICD-10-CM

## 2023-01-15 ENCOUNTER — Other Ambulatory Visit: Payer: Self-pay | Admitting: *Deleted

## 2023-01-15 MED ORDER — PREDNISONE 10 MG PO TABS: 10.0000 mg | ORAL_TABLET | Freq: Every day | ORAL | 0 refills | Status: DC

## 2023-01-28 ENCOUNTER — Encounter: Payer: Self-pay | Admitting: Emergency Medicine

## 2023-01-28 ENCOUNTER — Encounter: Payer: Self-pay | Admitting: Hematology and Oncology

## 2023-01-29 ENCOUNTER — Other Ambulatory Visit: Payer: Self-pay | Admitting: *Deleted

## 2023-01-30 ENCOUNTER — Ambulatory Visit (INDEPENDENT_AMBULATORY_CARE_PROVIDER_SITE_OTHER): Payer: Medicare HMO | Admitting: Otolaryngology

## 2023-01-30 ENCOUNTER — Encounter (INDEPENDENT_AMBULATORY_CARE_PROVIDER_SITE_OTHER): Payer: Self-pay | Admitting: Otolaryngology

## 2023-01-30 VITALS — BP 121/79 | HR 63 | Ht 64.0 in | Wt 176.0 lb

## 2023-01-30 DIAGNOSIS — Z853 Personal history of malignant neoplasm of breast: Secondary | ICD-10-CM | POA: Diagnosis not present

## 2023-01-30 DIAGNOSIS — R1312 Dysphagia, oropharyngeal phase: Secondary | ICD-10-CM

## 2023-01-30 DIAGNOSIS — K219 Gastro-esophageal reflux disease without esophagitis: Secondary | ICD-10-CM

## 2023-01-30 DIAGNOSIS — R0981 Nasal congestion: Secondary | ICD-10-CM

## 2023-01-30 DIAGNOSIS — J690 Pneumonitis due to inhalation of food and vomit: Secondary | ICD-10-CM

## 2023-01-30 DIAGNOSIS — K118 Other diseases of salivary glands: Secondary | ICD-10-CM

## 2023-01-30 DIAGNOSIS — R0982 Postnasal drip: Secondary | ICD-10-CM

## 2023-01-30 DIAGNOSIS — J3089 Other allergic rhinitis: Secondary | ICD-10-CM

## 2023-01-30 MED ORDER — DESLORATADINE 5 MG PO TABS: 5.0000 mg | ORAL_TABLET | Freq: Every day | ORAL | 3 refills | Status: DC

## 2023-01-30 MED ORDER — FLUTICASONE PROPIONATE 50 MCG/ACT NA SUSP: 2.0000 | Freq: Every day | NASAL | 6 refills | Status: AC

## 2023-01-30 NOTE — Progress Notes (Signed)
ENT CONSULT:  Reason for Consult: parotid mass bx (+) Warthin's tumor and concern for aspiration GERD   HPI: [name] is an 67 y.o. female with hx of breast cancer diagnosed 2015, right side, treated with chemo then mastectomy, no other post-op medications, currently undergoing workup for left upper lobe lung consolidation with biopsy negative for malignancy thus far, followed by both pulmonary and oncology, here for evaluation of incidentally noted right parotid mass on PET/CT.  Of note patient was also evaluated for aspiration due to concern that lung pathology is caused by chronic aspiration, and modified barium swallow did not demonstrate aspiration.  She was sent to GI for evaluation by her oncologist.  She denies fevers or chills.  She denies weight loss.  Reports tolerating regular diet currently.  She has history of acid reflux and reports heartburn symptoms, she was started on PPI and famotidine and was advised to follow diet and lifestyle changes to minimize reflux.  This appears to decrease frequency of heartburn symptoms.  She stopped smoking 1.5 yrs ago, used to smoke for 20 yrs 2 PPD.  Currently vaping.  She also reports chronic nasal congestion with postnasal drainage and sensation of inability to breathe through her nose.  Reports average sense of smell.  She has postnasal drainage.  Currently not on any medications for the symptoms.  She reports remote history of sinus surgery and septum surgery 1986.  She has history of SVT and cannot take medications that affect heart rate, on atenolol.  Records Reviewed:  Office note by Dr Delton Coombes Pulmonary 01/08/23 Lipoid pneumonia (HCC) Repeat CT chest was reassuring with the decrease in size of the previously observed left and right sided infiltrates.  I will repeat another scan in 6 months to ensure full resolution.  After that we may not need to continue to do dedicated serial follow-up.  Did speak with her today about the risk associated with  lipid vaping, aspiration.  She does have significant GERD and has had what sounds like aspiration and choking of reflux at night.  This may be the cause of her lipoid pneumonia.  She is going to see GI.  In the meantime her PPI was recently increased to twice daily, agree with this.  Patient communication Dr Pamelia Hoit Oncology 01/03/23 I called the patient and discussed the results of the CT chest and the swallowing study. She has an appointment to speak with Dr. Delton Coombes about the scan results. She would like to get a hospital bed and will request Dr. Delton Coombes if he would be able to write for that. For her acid reflux and Barrett's: She will discuss with gastroenterology to get another endoscopy.   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), hypoglycemia 2/04-resolved with PO calcium + D, SVT, was treated with iterferon and ribavirin-10/3-2/04   Personal history of chemotherapy    PUD (peptic ulcer disease)    Rheumatoid arthritis(714.0)    possible   SVT (supraventricular tachycardia)    Wears glasses     Past Surgical History:  Procedure Laterality Date   ABDOMINAL HYSTERECTOMY     BREAST SURGERY N/A    Phreesia 03/21/2020   CESAREAN SECTION  09/17/1980   CESAREAN SECTION N/A    Phreesia 03/21/2020   COLONOSCOPY     DILATION AND CURETTAGE  OF UTERUS     hysterectomy-for dysmenorrhea  09/17/2000   JOINT REPLACEMENT N/A    Phreesia 03/21/2020   MASTECTOMY Right 2016   MASTECTOMY W/ SENTINEL NODE BIOPSY Right 07/28/2014   Procedure: RIGHT MASTECTOMY WITH SENTINEL LYMPH NODE BIOPSY ;  Surgeon: Almond Lint, MD;  Location: [facility];  Service: General;  Laterality: Right;   NASAL SINUS SURGERY  03/20/1984   for deviated septum   PORTACATH PLACEMENT Left 01/20/2014   Procedure: INSERTION PORT-A-CATH;  Surgeon: Almond Lint, MD;  Location: [facility];  Service: General;  Laterality: Left;   TONSILLECTOMY  05/20/1961   UPPER GI ENDOSCOPY      Family History  Problem Relation Age of Onset   Dementia Mother    Hypertension Mother    Hyperlipidemia Mother    Cancer Father 39       lung   Dementia Father    Breast cancer Maternal Aunt    Cancer Paternal Uncle    Breast cancer Cousin    Colon cancer Neg Hx     Social History:  reports that she has been smoking. She has never used smokeless tobacco. She reports current alcohol use. She reports that she does not use drugs.  Allergies:  Allergies  Allergen Reactions   Codeine Itching and Nausea Only    Pt has had codeine with pre-administration to prevent reaction without any problem.     Medications: I have reviewed the patient's current medications.  The PMH, PSH, Medications, Allergies, and SH were reviewed and updated.  ROS: Constitutional: Negative for fever, weight loss and weight gain. Cardiovascular: Negative for chest pain and dyspnea on exertion. Respiratory: Is not experiencing shortness of breath at rest. Gastrointestinal: Negative for nausea and vomiting. Neurological: Negative for headaches. Psychiatric: The patient is not nervous/anxious  Blood pressure 121/79, pulse 63, height 5\' 4"  (1.626 m), weight 176 lb (79.8 kg), SpO2 98%.  PHYSICAL EXAM:  Exam: General: Well-developed, well-nourished Communication and Voice:  raspy voice  Respiratory Respiratory effort: Equal inspiration and expiration without stridor Cardiovascular Peripheral Vascular: Warm extremities with equal color/perfusion Eyes: No nystagmus with equal extraocular motion bilaterally Neuro/Psych/Balance: Patient oriented to person, place, and time; Appropriate mood and affect; Gait is intact with no imbalance; Cranial nerves I-XII are intact Head and Face Inspection: Normocephalic and atraumatic without mass or lesion Palpation: Facial skeleton intact without bony stepoffs Salivary Glands: Right parotid tail mass, below the level of the mandible 2 to 3 cm in size, no overlying skin changes Facial Strength: Facial motility symmetric and full bilaterally ENT Pinna: External ear intact and fully developed External canal: Canal is patent with intact skin Tympanic Membrane: Clear and mobile External Nose: No scar or anatomic deformity Internal Nose: Septum is deviated to the left with S shaped septum and narrow nasal passages. No polyp, or purulence. Mucosal edema and erythema present.  Bilateral inferior turbinate hypertrophy.  Lips, Teeth, and gums: Mucosa and teeth intact and viable TMJ: No pain to palpation with full mobility Oral cavity/oropharynx: No erythema or exudate, no lesions present Nasopharynx: No mass or lesion with intact mucosa Hypopharynx: Intact mucosa without pooling of secretions Larynx Glottic: Full true vocal cord mobility without lesion or mass, with bilateral vocal fold atrophy Supraglottic: Normal appearing epiglottis and AE folds Interarytenoid Space: Moderate pachydermia edema Subglottic Space: Patent without lesion or edema Neck Neck and Trachea: Midline trachea without mass or lesion Thyroid: No mass or nodularity Lymphatics: No lymphadenopathy  Procedure: Preoperative diagnosis: parotid/neck mass concern for dysphagia/aspiration GERD LPR  Postoperative diagnosis:   Same  Procedure: Flexible fiberoptic  laryngoscopy  Surgeon: Ashok Croon, MD  Anesthesia: Topical lidocaine and Afrin Complications: None Condition is stable throughout exam  Indications and consent:  The patient presents to the clinic with Indirect laryngoscopy view was incomplete. Thus it was recommended that they undergo a flexible fiberoptic laryngoscopy. All of the risks, benefits, and potential complications were reviewed with the patient preoperatively and verbal informed consent was obtained.  Procedure: The patient was seated upright in the clinic. Topical lidocaine and Afrin were applied to the nasal cavity. After adequate anesthesia had occurred, I then proceeded to pass the flexible telescope into the nasal cavity. The nasal cavity was patent without rhinorrhea or polyp. The nasopharynx was also patent without mass or lesion. The base of tongue was visualized and was normal. There were no signs of pooling of secretions in the piriform sinuses. The true vocal folds were mobile bilaterally. There were no signs of glottic or supraglottic mucosal lesion or mass. There was moderate interarytenoid pachydermia and post cricoid edema. The telescope was then slowly withdrawn and the patient tolerated the procedure throughout.   Studies Reviewed: MBS done 01/02/23 HPI/PMH: HPI: Pt is a 67 year old female arriving for an OP MBS referred by pulmonology due to following concern " LUL mass, was treated for clinical PNA. Results showed lipoid PNA. Aspiration is a potential cause. Also, she continues to vape, which can cause lipoid PNA." Pt has a history of COPD and complains of nighttime GERD     Clinical Impression: Clinical Impression: Pt demosntrates normal swallow function. No dysphagia or aspiration. Pt denies complaint except for night time reflux. Recommend no SLP f/u, pt to continue diet.  CT chest w/o contrast 12/30/22 IMPRESSION: 1. The left upper lobe area of nodular consolidation is decreased compared to the PET of  10/24/2022. The right lower lobe lung nodule is also decreased in size and prevascular hypermetabolic node is similar to minimally decreased. 2. No thoracic adenopathy or findings to suggest metastatic disease. 3. Incidental findings, including: Aortic atherosclerosis (ICD10-I70.0), coronary artery atherosclerosis and emphysema (ICD10-J43.9). Tiny hiatal hernia.  10/24/2022 NM PET/CT EXAM: NUCLEAR MEDICINE PET SKULL BASE TO THIGH   TECHNIQUE: 8.8 mCi F-18 FDG was injected intravenously. Full-ring PET imaging was performed from the skull base to thigh after the radiotracer. CT data was obtained and used for attenuation correction and anatomic localization.   Fasting blood glucose: 114 mg/dl   COMPARISON:  CT chest 03/20/2015.   FINDINGS: Mediastinal blood pool activity: SUV max 2.7   Liver activity: SUV max NA   NECK:   Hypermetabolic right parotid nodule measures 15 mm (4/31), SUV max 16.0. Small hypermetabolic left level 2 lymph nodes. Index lymph node measures 5 mm (4/29), SUV max 3.7.   Incidental CT findings:   Mucosal thickening in the left maxillary sinus.   CHEST:  Small AP window lymph node measures 5 mm (4/66), SUV max 3.4. Hypermetabolic mass in the anterior segment left upper lobe measures 3.1 x 4.6 cm (7/21), SUV max 16.9. Additional hypermetabolic pulmonary nodules extend to the right hemithorax. Index nodule in the medial right lower lobe measures 8 x 11 mm (7/29), SUV max 6.9.   Incidental CT findings:   Left IJ Port-A-Cath terminates in the SVC. Atherosclerotic calcification of the aorta, aortic valve and coronary arteries. Heart is enlarged. No pericardial or pleural effusion. Centrilobular and paraseptal emphysema.   ABDOMEN/PELVIS:   No abnormal hypermetabolism.   Incidental CT findings:   Liver, gallbladder, adrenal glands, kidneys, spleen pancreas, stomach and bowel are grossly unremarkable.   SKELETON:   No focal abnormal  hypermetabolism.   Incidental CT findings:   Right hip arthroplasty.  Degenerative changes in the spine.   IMPRESSION: 1. Hypermetabolic left upper lobe mass with hypermetabolic nodules in the right hemithorax and a hypermetabolic ipsilateral mediastinal lymph node. Findings are worrisome for T2bN2M1a or stage IVA disease. If the hypermetabolic left cervical lymph nodes are involved, findings would be compatible with stage IVB disease. Difficult to definitively exclude an infectious/inflammatory process but considered less likely. Comparison with reported lung cancer screening CT performed at [facility] would be helpful in this regard. 2. Hypermetabolic right parotid nodule, malignancy cannot be excluded. 3. Aortic atherosclerosis (ICD10-I70.0). Coronary artery calcification. 4.  Emphysema (ICD10-J43.9).   Surg Path LUL mass biopsy FINAL MICROSCOPIC DIAGNOSIS:   A. LEFT LUNG, MASS, BIOPSY:  Benign alveolated lung with fibrosis and acute and chronic inflammation  with multinucleate histiocytes compatible with lipoid pneumonia (see  comment)  Acid-fast bacilli and fungal stains negative (AFB, GMS; adequate  controls)  Negative for tumor, capillaritis, necrosis and granulomas   FINAL MICROSCOPIC DIAGNOSIS:   A. PAROTID MASS, HM RIGHT, BIOPSY:  - Warthin's tumor    Assessment/Plan: Encounter Diagnoses  Name Primary?   Parotid mass Yes   Aspiration pneumonia, unspecified aspiration pneumonia type, unspecified laterality, unspecified part of lung (HCC) [J69.0]    Gastroesophageal reflux disease without esophagitis [K21.9]    History of breast cancer    Nasal congestion    Post-nasal drainage    Environmental and seasonal allergies    Oropharyngeal dysphagia    68 year old female with history of breast cancer currently followed by Dr. Pamelia Hoit Oncology and Dr Delton Coombes pulmonary, who was noted to have right parotid mass on recent PET/CT.  Of note she is also currently undergoing  workup for Left upper lobe nodular consolidation noted on both PET/CT and CT chest as well as right lower lobe nodule.  The findings were initially thought to represent pneumonia and were clinically treated as such, with subsequent decrease in findings on repeat imaging.  There was a concern for aspiration but she had modified barium swallow which demonstrated normal swallowing function and no aspiration per report.  She is here for evaluation.  Record review indicates she was also referred to GI for GERD.   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  She denies any symptoms pertaining to the mass.  Has history of smoking but quit recently and currently only does vaping.  - schedule neck CT with contrast  2. She reports chronic longstanding  nasal congestion currently not on any medications for it, exam today which included flexible laryngoscopy demonstrated nasal mucosal edema clear secretions but no purulence or pus.  There was evidence of clear secretions on both sides of the nasal passages.  Her septum was deviated.  There was postnasal drainage present as well.  Symptoms are likely due to undiagnosed environmental allergies versus vasomotor rhinitis - start Clarinex and Flonase (for nasal congestion)  -Will review CT neck when done specifically will also note if there is any mucosal thickening in the sinuses to rule out chronic sinus inflammation  - see Pulmonary Oncology and GI as scheduled   3.  GERD LPR, concern for dysphagia and aspiration  -I reviewed results of modified barium swallow and there appears to be no evidence of aspiration at the time of the swallow study.  Flexible laryngoscopy exam today did not demonstrate pooling of secretions in hypopharynx, her  vocal folds were mobile bilaterally, and there was evidence of moderate to severe postcricoid edema and pachydermia consistent with GERD LPR.  She is already on reflux medications including famotidine and Protonix, and scheduled to see GI.  I advised her to continue medications, and discussed de-escalation of PPI therapy with GI physician when she sees them.  - add reflux gourmet to reflux medications -Diet and lifestyle changes to minimize reflux symptoms   Workup and management of lung pathology is ongoing, and I discussed with the patient that although we are going to obtain dedicated CT neck to better assess right parotid mass, surgical management of the mass can occur after completion of her workup for suspected pneumonia versus malignancy in her lungs.  She will return after testing, and we will discuss the best timing for partial parotidectomy.  All questions have been answered.  Thank you for allowing me to participate in the care of this patient. Please do not hesitate to contact me with any questions or concerns.   Ashok Croon, MD Otolaryngology [facility] Phone: [phone] Fax: [phone]    01/31/2023, 5:03 AM

## 2023-01-30 NOTE — Patient Instructions (Addendum)
-   start Clarinex and Flonase (for nasal congestion) - schedule neck CT with contrast - see Pulmonary and Oncology as scheduled  - add reflux gourmet to your reflux medications  - return after testing   - Take Reflux Gourmet (natural supplement available on [employer]) to help with symptoms of chronic throat irritation

## 2023-01-31 ENCOUNTER — Telehealth: Payer: Self-pay | Admitting: Emergency Medicine

## 2023-01-31 NOTE — Telephone Encounter (Signed)
Fax received from Dr. Yevonne Pax [phone] with [facility] to perform a colonoscopy on patient.  Patient needs surgery clearance. Surgery is 04/10/23. Patient was seen on 01/08/23. Office protocol is a risk assessment can be sent to surgeon if patient has been seen in 60 days or less.   Sending to Dr. Delton Coombes for risk assessment or recommendations if patient needs to be seen in office prior to surgical procedure.

## 2023-02-03 ENCOUNTER — Telehealth: Payer: Self-pay

## 2023-02-03 NOTE — Telephone Encounter (Signed)
cpt code 16109. Good from 09/16-11/15/2024 Authorization#[id number]

## 2023-02-03 NOTE — Telephone Encounter (Signed)
error 

## 2023-02-04 NOTE — Telephone Encounter (Signed)
I wrote an addendum to her office visit from 01/08/2023 to define her surgical risk and risk for consultation.  Thank you

## 2023-02-06 ENCOUNTER — Other Ambulatory Visit: Payer: Self-pay | Admitting: Hematology and Oncology

## 2023-02-06 MED ORDER — OXYCODONE-ACETAMINOPHEN 5-325 MG PO TABS: 2.0000 | ORAL_TABLET | Freq: Three times a day (TID) | ORAL | 0 refills | Status: DC | PRN

## 2023-02-07 ENCOUNTER — Telehealth: Payer: Self-pay | Admitting: Emergency Medicine

## 2023-02-07 NOTE — Telephone Encounter (Signed)
Note was faxed to [facility]

## 2023-02-07 NOTE — Telephone Encounter (Signed)
Pt needs a detailed surgical clearance

## 2023-02-11 ENCOUNTER — Encounter (HOSPITAL_COMMUNITY): Payer: Self-pay

## 2023-02-11 ENCOUNTER — Ambulatory Visit (HOSPITAL_COMMUNITY)
Admission: RE | Admit: 2023-02-11 | Discharge: 2023-02-11 | Disposition: A | Discharge status: Home / Self Care | Payer: Medicare HMO | Source: Ambulatory Visit | Attending: Otolaryngology | Admitting: Otolaryngology

## 2023-02-11 DIAGNOSIS — K118 Other diseases of salivary glands: Secondary | ICD-10-CM | POA: Diagnosis present

## 2023-02-11 MED ORDER — IOHEXOL 300 MG/ML  SOLN
75.0000 mL | Freq: Once | INTRAMUSCULAR | Status: AC | PRN
  Administered 2023-02-11: 75 mL via INTRAVENOUS

## 2023-02-11 MED ORDER — SODIUM CHLORIDE (PF) 0.9 % IJ SOLN
INTRAMUSCULAR | Status: AC
  Filled 2023-02-11: qty 50

## 2023-02-11 NOTE — Telephone Encounter (Signed)
[name] from [facility] checking on surgical clearance. [name] phone number is [phone]. Fax number is [phone].

## 2023-02-14 NOTE — Telephone Encounter (Signed)
This is a duplicate msg  Closing encounter

## 2023-02-25 ENCOUNTER — Telehealth: Payer: Self-pay | Admitting: *Deleted

## 2023-02-25 NOTE — Telephone Encounter (Signed)
Received VM from pt requesting f/u visit with MD to discuss several topics including the need to increase her pain medications, neck pain, and recent visit with rheumatologist.  Message sent to scheduling team.

## 2023-02-28 ENCOUNTER — Telehealth: Payer: Self-pay | Admitting: *Deleted

## 2023-02-28 ENCOUNTER — Telehealth: Payer: Self-pay | Admitting: Adult Health

## 2023-02-28 NOTE — Telephone Encounter (Signed)
Received call from pt stating she is experiencing increased pain in her hip from osteonecrosis.  Pt states she is currently prescribed Percocet from MD for pain related to Letrozole but is taking more for the treatment of her osteonecrosis.  Pt states she is currently taking 12 tablets a day and would like to increase her Percocet to every 4-6 hours.  RN informed pt that MD is out of office and we are not the treating physician for her osteonecrosis.  Pt educated to f/u with Ortho for pain management related to osteonecrosis.  Pt verbalized understanding.

## 2023-02-28 NOTE — Telephone Encounter (Signed)
Per staff message sent on 10/11 Patient is aware of scheduled appointment times/dates for follow up with Mardella Layman; Patient had stated they do have some conflicting appointment times/dates previously scheduled and that they will call back if appointment times do not line up for them

## 2023-03-03 ENCOUNTER — Encounter (HOSPITAL_BASED_OUTPATIENT_CLINIC_OR_DEPARTMENT_OTHER): Payer: Self-pay

## 2023-03-03 ENCOUNTER — Emergency Department (HOSPITAL_BASED_OUTPATIENT_CLINIC_OR_DEPARTMENT_OTHER)
Admission: EM | Admit: 2023-03-03 | Discharge: 2023-03-03 | Disposition: A | Discharge status: Home / Self Care | Payer: Medicare HMO | Attending: Emergency Medicine | Admitting: Emergency Medicine

## 2023-03-03 ENCOUNTER — Emergency Department (HOSPITAL_BASED_OUTPATIENT_CLINIC_OR_DEPARTMENT_OTHER): Payer: Medicare HMO

## 2023-03-03 ENCOUNTER — Other Ambulatory Visit: Payer: Self-pay

## 2023-03-03 DIAGNOSIS — M542 Cervicalgia: Secondary | ICD-10-CM | POA: Diagnosis present

## 2023-03-03 MED ORDER — IBUPROFEN 400 MG PO TABS
600.0000 mg | ORAL_TABLET | Freq: Once | ORAL | Status: AC
  Administered 2023-03-03: 600 mg via ORAL
  Filled 2023-03-03: qty 1

## 2023-03-03 NOTE — ED Triage Notes (Signed)
States has neck stiffness where neck/head fall forwards.  Pain when brings head back.  Denies injury but had fall three weeks ago but did not hit head.

## 2023-03-03 NOTE — ED Provider Notes (Signed)
[place] EMERGENCY DEPARTMENT AT [facility] Provider Note   CSN: [id number] Arrival date & time: 03/03/23  0915     History  Chief Complaint  Patient presents with   Torticollis    [name] is a 67 y.o. female.  HPI   67 year old female presenting to the emergency department with neck pain.  The patient states that she fell 4 weeks ago, is not on anticoagulation, denies any known injury however she has been having occasional pain in the right side of her neck and in the middle of her neck and has noted some difficulty with range of motion of her neck ever since.  She states that sometimes her head will not forward and she will almost feel a catching sensation in her neck when she tries to extend her neck.  She also endorses some difficulty with lateral rotation of her neck.  No new falls or trauma over that time but due to persistent discomfort she presents to the emergency department for further evaluation.  She denies any other injuries or complaints, no fevers or chills.  Home Medications Prior to Admission medications   Medication Sig Start Date End Date Taking? Authorizing Provider  ALPRAZolam (XANAX) 0.5 MG tablet TAKE 1 TABLET(0.5 MG) BY MOUTH 1 TIME AS NEEDED FOR ANXIETY. MAX DAILY AMOUNT: 0.5 MG 11/28/22   [provider]  atenolol (TENORMIN) 50 MG tablet TAKE 1 TABLET(50 MG) BY MOUTH TWICE DAILY Patient taking differently: Take 50 mg by mouth daily. TAKE 2 TABLET(50 MG) BY MOUTH TWICE DAILY 09/11/22   Lewayne Bunting, MD  B Complex-C (B-COMPLEX WITH VITAMIN C) tablet Take 1 tablet by mouth 2 (two) times a week.    [provider]  desloratadine (CLARINEX) 5 MG tablet Take 1 tablet (5 mg total) by mouth daily. 01/30/23   Ashok Croon, MD  famotidine (PEPCID) 40 MG tablet Take 40 mg by mouth 2 (two) times daily. 01/22/23   [provider]  fluticasone (FLONASE) 50 MCG/ACT nasal spray Place 2 sprays into both nostrils daily. 01/30/23    Ashok Croon, MD  gabapentin (NEURONTIN) 600 MG tablet Take 600 mg by mouth 3 (three) times daily. 03/20/21   [provider]  ibuprofen (ADVIL,MOTRIN) 600 MG tablet Take 1 tablet (600 mg total) by mouth every 6 (six) hours as needed. Patient taking differently: Take 200 mg by mouth every 6 (six) hours as needed. 02/03/17   Audry Pili, PA-C  letrozole [facility]) 2.5 MG tablet Take 1 tablet (2.5 mg total) by mouth daily. 11/07/22   Serena Croissant, MD  Multiple Vitamins-Minerals (WOMENS MULTIVITAMIN PO) Take 1 tablet by mouth 3 (three) times a week.    [provider]  mupirocin ointment (BACTROBAN) 2 % Apply 1 Application topically 3 (three) times daily. Patient not taking: Reported on 01/30/2023 08/29/22   [provider]  ondansetron (ZOFRAN) 8 MG tablet Take 1 tablet (8 mg total) by mouth every 8 (eight) hours as needed for nausea or vomiting. 12/12/22   Serena Croissant, MD  oxyCODONE-acetaminophen (PERCOCET/ROXICET) 5-325 MG tablet Take 2 tablets by mouth every 8 (eight) hours as needed for severe pain. 02/06/23   Serena Croissant, MD  pantoprazole (PROTONIX) 40 MG tablet Take 40 mg by mouth 2 (two) times daily.    [provider]  predniSONE (DELTASONE) 10 MG tablet Take 1 tablet (10 mg total) by mouth daily with breakfast. 01/15/23   Serena Croissant, MD  Turmeric (QC TUMERIC COMPLEX PO) Take 1 tablet by  mouth daily.    [provider]      Allergies    Codeine    Review of Systems   Review of Systems  All other systems reviewed and are negative.   Physical Exam Updated Vital Signs BP (!) 135/50   Pulse 64   Temp 98.1 F (36.7 C) (Oral)   Resp 18   SpO2 97%  Physical Exam Vitals and nursing note reviewed.  Constitutional:      General: She is not in acute distress. HENT:     Head: Normocephalic and atraumatic.  Eyes:     Conjunctiva/sclera: Conjunctivae normal.     Pupils: Pupils are equal, round, and reactive to light.  Neck:     Comments:  Bilateral paraspinal muscular tenderness, mild midline tenderness of the cervical spine Cardiovascular:     Rate and Rhythm: Normal rate and regular rhythm.  Pulmonary:     Effort: Pulmonary effort is normal. No respiratory distress.  Abdominal:     General: There is no distension.     Tenderness: There is no guarding.  Musculoskeletal:        General: No deformity or signs of injury.     Cervical back: Neck supple.  Skin:    Findings: No lesion or rash.  Neurological:     General: No focal deficit present.     Mental Status: She is alert. Mental status is at baseline.     Comments: No cranial nerve deficit, 5 out of 5 strength in all 4 extremities with intact sensation to light touch     ED Results / Procedures / Treatments   Labs (all labs ordered are listed, but only abnormal results are displayed) Labs Reviewed - No data to display  EKG None  Radiology CT Cervical Spine Wo Contrast  Result Date: 03/03/2023 CLINICAL DATA:  67 year old female with neck stiffness and pain status post fall 3 weeks ago. Known right parotid mass EXAM: CT CERVICAL SPINE WITHOUT CONTRAST TECHNIQUE: Multidetector CT imaging of the cervical spine was performed without intravenous contrast. Multiplanar CT image reconstructions were also generated. RADIATION DOSE REDUCTION: This exam was performed according to the departmental dose-optimization program which includes automated exposure control, adjustment of the mA and/or kV according to patient size and/or use of iterative reconstruction technique. COMPARISON:  Neck CT 02/11/2023 FINDINGS: Alignment: Maintained cervical lordosis, stable from last month. Cervicothoracic junction alignment is within normal limits. Bilateral posterior element alignment is within normal limits. Skull base and vertebrae: Visualized skull base is intact. No atlanto-occipital dissociation. Stable bone mineralization. C1 and C2 appear intact and aligned. No acute osseous abnormality  identified. Soft tissues and spinal canal: No prevertebral fluid or swelling. No visible canal hematoma. Stable visible neck soft tissues including posterior right parotid space nodule reportedly biopsied and found to be Warthin's tumor. Disc levels: Cervical spine degeneration appears very mild for age at most levels. There is chronic disc and endplate degeneration at C5-C6. Upper chest: Stable.  Partially visible emphysema. Other: Cervicomedullary junction is within normal limits. Negative visible noncontrast posterior fossa. Visible tympanic cavities and mastoids are clear. IMPRESSION: 1. No acute osseous abnormality identified in the cervical spine. Mild for age cervical spine degeneration, maximal at C5-C6. 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 3. Emphysema. Electronically Signed   By: Odessa Fleming M.D.   On: 03/03/2023 11:52    Procedures Procedures    Medications Ordered in ED Medications  ibuprofen (ADVIL) tablet 600 mg (has no administration in time range)  ED Course/ Medical Decision Making/ A&P                                 Medical Decision Making Amount and/or Complexity of Data Reviewed Radiology: ordered.     67 year old female presenting to the emergency department with neck pain.  The patient states that she fell 4 weeks ago, is not on anticoagulation, denies any known injury however she has been having occasional pain in the right side of her neck and in the middle of her neck and has noted some difficulty with range of motion of her neck ever since.  She states that sometimes her head will not forward and she will almost feel a catching sensation in her neck when she tries to extend her neck.  She also endorses some difficulty with lateral rotation of her neck.  No new falls or trauma over that time but due to persistent discomfort she presents to the emergency department for further evaluation.  She denies any other injuries or complaints, no fevers or chills.  On  arrival, the patient was afebrile, hemodynamically stable.  Physical exam revealed mild tenderness bilaterally of the paraspinal soft tissues, mild midline tenderness.  C-collar placed on patient arrival.  She is neurologically intact with no strength or sensory deficit, no red flag symptoms to suggest spinal cord compression.  Considered fracture, subluxation, ligamentous injury.  CT Cervical Spine WO: IMPRESSION:  1. No acute osseous abnormality identified in the cervical spine.  Mild for age cervical spine degeneration, maximal at C5-C6.  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  3. Emphysema.    Patient without evidence of traumatic injury to the cervical spine on CT imaging.  I recommended that she wear her collar for comfort primarily but feel that some of this could be soft tissue in nature.  Recommend NSAIDs for pain control, follow-up outpatient with her PCP for consideration for outpatient MRI to further evaluate her cervical spine.  At this time, the patient is stable for outpatient management.  Final Clinical Impression(s) / ED Diagnoses Final diagnoses:  Neck pain    Rx / DC Orders ED Discharge Orders     None         Ernie Avena, MD 03/03/23 1157

## 2023-03-03 NOTE — ED Notes (Signed)
Aspen collar applied with Freida Busman, Charity fundraiser per EDP. Pt transported to CT

## 2023-03-03 NOTE — Discharge Instructions (Signed)
Your CT imaging was overall reassuring, did reveal evidence of a small parotid mass: IMPRESSION:  1. No acute osseous abnormality identified in the cervical spine.  Mild for age cervical spine degeneration, maximal at C5-C6.  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  3. Emphysema.   Recommend you follow-up outpatient with your primary care doctor, you could benefit from outpatient MRI imaging of your cervical spine.  Recommend NSAIDs for pain control, can utilize your c-collar for comfort as needed.

## 2023-03-04 ENCOUNTER — Ambulatory Visit: Payer: Medicare HMO | Admitting: Adult Health

## 2023-03-04 ENCOUNTER — Encounter: Payer: Self-pay | Admitting: Adult Health

## 2023-03-04 ENCOUNTER — Ambulatory Visit: Payer: Medicare HMO | Admitting: Emergency Medicine

## 2023-03-04 VITALS — BP 116/72 | HR 79 | Temp 98.2°F | Ht 62.0 in | Wt 181.0 lb

## 2023-03-04 DIAGNOSIS — J449 Chronic obstructive pulmonary disease, unspecified: Secondary | ICD-10-CM

## 2023-03-04 DIAGNOSIS — J691 Pneumonitis due to inhalation of oils and essences: Secondary | ICD-10-CM

## 2023-03-04 DIAGNOSIS — Z23 Encounter for immunization: Secondary | ICD-10-CM | POA: Diagnosis not present

## 2023-03-04 LAB — PULMONARY FUNCTION TEST
DL/VA % pred: 58 %
DL/VA: 2.48 ml/min/mmHg/L
DLCO cor % pred: 56 %
DLCO cor: 10.39 ml/min/mmHg
DLCO unc % pred: 56 %
DLCO unc: 10.39 ml/min/mmHg
FEF 25-75 Post: 1.2 L/s
FEF 25-75 Pre: 0.79 L/s
FEF2575-%Change-Post: 51 %
FEF2575-%Pred-Post: 62 %
FEF2575-%Pred-Pre: 41 %
FEV1-%Change-Post: 21 %
FEV1-%Pred-Post: 71 %
FEV1-%Pred-Pre: 59 %
FEV1-Post: 1.56 L
FEV1-Pre: 1.28 L
FEV1FVC-%Change-Post: 3 %
FEV1FVC-%Pred-Pre: 87 %
FEV6-%Change-Post: 17 %
FEV6-%Pred-Post: 81 %
FEV6-%Pred-Pre: 69 %
FEV6-Post: 2.24 L
FEV6-Pre: 1.91 L
FEV6FVC-%Pred-Post: 104 %
FEV6FVC-%Pred-Pre: 104 %
FVC-%Change-Post: 17 %
FVC-%Pred-Post: 78 %
FVC-%Pred-Pre: 66 %
FVC-Post: 2.24 L
FVC-Pre: 1.91 L
Post FEV1/FVC ratio: 70 %
Post FEV6/FVC ratio: 100 %
Pre FEV1/FVC ratio: 67 %
Pre FEV6/FVC Ratio: 100 %
RV % pred: 162 %
RV: 3.29 L
TLC % pred: 117 %
TLC: 5.6 L

## 2023-03-04 MED ORDER — STIOLTO RESPIMAT 2.5-2.5 MCG/ACT IN AERS: 2.0000 | INHALATION_SPRAY | Freq: Every day | RESPIRATORY_TRACT | 5 refills | Status: DC

## 2023-03-04 NOTE — Progress Notes (Signed)
@Patient  ID: [name], female    DOB: 07-16-1955, 67 y.o.   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   CT chest December 30, 2022 left upper lobe lung mass/consolidation decreased in size right lower lobe lung nodule decreased in size no thoracic adenopathy or findings suggestive of metastatic disease   03/04/2023 Follow up ; Lung mass , COPD , PFT  Patient returns for a 43-month follow-up.  Patient has been followed for left upper lobe lung mass.  Workup revealed a hypermetabolic mass in the left upper lobe on PET scan with hypermetabolic mediastinal lymph nodes and right hemothorax nodules.  Subsequent CT-guided biopsy on November 29, 2022 showed acute and chronic inflammation compatible with lipoid pneumonia.  Follow-up serial CT chest showed decrease in left upper lobe lung mass/consolidation.  Modified barium swallow showed no evidence of dysphagia or occult aspiration.  Patient was recommended to stop vaping.  She does continue to vape -which are oil based.  Since last visit patient says she is doing okay.  Denies any increased cough or congestion.  Patient was  set up for PFTs that were completed today that show moderate airflow obstruction with reversibility.  FEV1 at 59%, ratio 67, FVC 66%, postbronchodilator FEV1 improved at 71%, ratio 70, FVC 78%, 21% bronchodilator change, DLCO decreased at 56%. Patient remains on Protonix twice daily.  She has been advised on aspiration precautions.  And also we discussed stopping vaping although she says that she really does not want to do this despite understanding that this could be causing further damage to her lungs and potentially contributing factor for her lipoid pneumonia. Patient denies any significant wheezing.  Says she has intermittent cough.  Does get short of breath with heavy activities. Patient says she has been having some difficulty as she has to use a walker now due to some hip issues.  Also is having some neck issues as well.  Both of these are limiting her activities.      Allergies  Allergen Reactions   Codeine Itching and Nausea Only    Pt has had codeine with pre-administration to prevent reaction without any problem.     Immunization History  Administered Date(s) Administered   Fluad Quad(high Dose 65+) 02/19/2021   Moderna Sars-Covid-2 Vaccination 08/26/2019, 09/23/2019   Td 05/20/1998    Past Medical History:  Diagnosis Date   Allergy    Phreesia 03/21/2020   Anemia    Phreesia 03/21/2020   Anxiety    Arthritis    Phreesia 03/21/2020   Blood transfusion without reported diagnosis    Phreesia 03/21/2020   Cancer (HCC)    right breast cancer   Depression    Depression    Phreesia 03/21/2020   Fibromyalgia  GERD (gastroesophageal reflux disease)    Phreesia 03/21/2020   Heavy smoker    Hepatitis C    Pt was given too much interferon - critical care x2d, 5 days total hosp   Neuromuscular disorder (HCC)    Phreesia 03/21/2020   Osteoporosis    Perimenopausal 11/18/2003   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), hypoglycemia 2/04-resolved with PO calcium + D, SVT, was treated with iterferon and ribavirin-10/3-2/04   Personal history of chemotherapy    PUD (peptic ulcer disease)    Rheumatoid arthritis(714.0)    possible   SVT (supraventricular tachycardia) (HCC)    Wears glasses     Tobacco History: Social History   Tobacco Use  Smoking Status Every Day  Smokeless Tobacco Never  Tobacco Comments   Pt is currently vapping ARJ 12/06/22   Ready to quit: Not Answered Counseling given: Not Answered Tobacco comments: Pt is currently vapping ARJ 12/06/22   Outpatient Medications Prior to Visit  Medication Sig Dispense Refill   ALPRAZolam (XANAX) 0.5 MG tablet Take 0.5 mg by mouth 2 (two) times daily.     atenolol (TENORMIN) 50 MG tablet TAKE 1 TABLET(50 MG) BY MOUTH TWICE DAILY (Patient taking differently: Take 50 mg by mouth daily. TAKE 2 TABLET(50 MG) BY MOUTH TWICE DAILY) 180 tablet 3   B Complex-C (B-COMPLEX WITH VITAMIN C) tablet Take 1 tablet by mouth 2 (two) times a week.     famotidine (PEPCID) 40 MG tablet Take 40 mg by mouth at bedtime.     fluticasone (FLONASE) 50 MCG/ACT nasal spray Place 2 sprays into both nostrils daily. 16 g 6   gabapentin (NEURONTIN) 600 MG tablet Take 600 mg by mouth 3 (three) times daily.     ibuprofen (ADVIL,MOTRIN) 600 MG tablet Take 1 tablet (600 mg total) by mouth every 6 (six) hours as needed. (Patient taking differently: Take 200 mg by mouth every 6 (six) hours as needed.) 30 tablet 0   letrozole (FEMARA) 2.5 MG tablet Take 1 tablet (2.5 mg total) by mouth daily. 90 tablet 3   Multiple Vitamins-Minerals (WOMENS MULTIVITAMIN PO) Take 1 tablet by mouth 3 (three) times a week.     ondansetron (ZOFRAN) 8 MG tablet Take 1 tablet (8 mg total) by mouth every 8 (eight) hours as needed for nausea or vomiting. 30 tablet 3   oxyCODONE-acetaminophen (PERCOCET/ROXICET) 5-325 MG tablet Take 2 tablets by mouth every 8 (eight) hours as needed for severe pain.  180 tablet 0   pantoprazole (PROTONIX) 40 MG tablet Take 40 mg by mouth 2 (two) times daily.     predniSONE (DELTASONE) 10 MG tablet Take 1 tablet (10 mg total) by mouth daily with breakfast. 90 tablet 0   Turmeric (QC TUMERIC COMPLEX PO) Take 1 tablet by mouth daily.     desloratadine (CLARINEX) 5 MG tablet Take 1 tablet (5 mg total) by mouth daily. 90 tablet 3   mupirocin ointment (BACTROBAN) 2 % Apply 1 Application topically 3 (three) times daily. (Patient not taking: Reported on 01/30/2023)     No facility-administered medications prior to visit.     Review of Systems:   Constitutional:   No  weight loss, night sweats,  Fevers, chills, + fatigue, or  lassitude.  HEENT:   No headaches,  Difficulty swallowing,  Tooth/dental problems, or  Sore throat,                No sneezing, itching, ear ache, nasal  congestion, post nasal drip,   CV:  No chest pain,  Orthopnea, PND, swelling in lower extremities, anasarca, dizziness, palpitations, syncope.   GI  No heartburn, indigestion, abdominal pain, nausea, vomiting, diarrhea, change in bowel habits, loss of appetite, bloody stools.   Resp:  No chest wall deformity  Skin: no rash or lesions.  GU: no dysuria, change in color of urine, no urgency or frequency.  No flank pain, no hematuria   MS:  No joint pain or swelling.  No decreased range of motion.  No back pain.    Physical Exam  BP 116/72 (BP Location: Left Arm, Patient Position: Sitting, Cuff Size: Normal)   Pulse 79   Temp 98.2 F (36.8 C) (Oral)   Ht 5\' 2"  (1.575 m)   Wt 181 lb (82.1 kg)   SpO2 96%   BMI 33.11 kg/m   GEN: A/Ox3; pleasant , NAD, well nourished   Walker   HEENT:  [place]/AT,   NOSE-clear, THROAT-clear, no lesions, no postnasal drip or exudate noted.  NECK:  Supple w/ fair ROM; no JVD; normal carotid impulses w/o bruits; no thyromegaly or nodules palpated; no lymphadenopathy.  Neck brace   Plan RESP  Clear  P & A; w/o, wheezes/ rales/ or rhonchi. no  accessory muscle use, no dullness to percussion  CARD:  RRR, no m/r/g, no peripheral edema, pulses intact, no cyanosis or clubbing.  GI:   Soft & nt; nml bowel sounds; no organomegaly or masses detected.   Musco: Warm bil, no deformities or joint swelling noted.   Neuro: alert, no focal deficits noted.    Skin: Warm, no lesions or rashes    Lab Results:  CBC    Component Value Date/Time   WBC 7.7 05/19/2021 1225   RBC 4.85 05/19/2021 1225   HGB 13.0 05/19/2021 1225   HGB 14.5 03/24/2020 1041   HGB 13.7 03/16/2015 1553   HCT 40.3 05/19/2021 1225   HCT 44.1 03/24/2020 1041   HCT 40.3 03/16/2015 1553   PLT 302 05/19/2021 1225   PLT 230 03/16/2015 1553   MCV 83.1 05/19/2021 1225   MCV 84 03/24/2020 1041   MCV 85.7 03/16/2015 1553   MCH 26.8 05/19/2021 1225   MCHC 32.3 05/19/2021 1225   RDW 14.5 05/19/2021 1225   RDW 13.5 03/24/2020 1041   RDW 16.3 (H) 03/16/2015 1553   LYMPHSABS 2.9 03/24/2020 1041   LYMPHSABS 4.1 (H) 03/16/2015 1553   MONOABS 0.5 04/12/2015 1009   MONOABS 0.6 03/16/2015 1553   EOSABS 0.2 03/24/2020 1041   BASOSABS 0.0 03/24/2020 1041   BASOSABS 0.0 03/16/2015 1553    BMET    Component Value Date/Time   NA 133 (L) 05/19/2021 1225   NA 131 (L) 03/24/2020 1041   NA 134 (L) 03/16/2015 1553   K 3.2 (L) 05/19/2021 1225   K 3.9 03/16/2015 1553   CL 103 05/19/2021 1225   CO2 19 (L) 05/19/2021 1225   CO2 25 03/16/2015 1553   GLUCOSE 94 05/19/2021 1225   GLUCOSE 87 03/16/2015 1553   BUN 10 05/19/2021 1225   BUN 11 03/24/2020 1041   BUN 10.3 03/16/2015 1553   CREATININE 0.73 05/19/2021 1225   CREATININE 0.8 03/16/2015 1553   CALCIUM 8.6 (L) 05/19/2021 1225   CALCIUM 9.6 03/16/2015 1553   GFRNONAA >60 05/19/2021 1225   GFRAA 83 03/24/2020 1041    BNP    Component Value Date/Time   BNP 24.3 08/15/2014 0750    ProBNP No results  found for: "PROBNP"  Imaging: CT Cervical Spine Wo Contrast  Result Date: 03/03/2023 CLINICAL DATA:   67 year old female with neck stiffness and pain status post fall 3 weeks ago. Known right parotid mass EXAM: CT CERVICAL SPINE WITHOUT CONTRAST TECHNIQUE: Multidetector CT imaging of the cervical spine was performed without intravenous contrast. Multiplanar CT image reconstructions were also generated. RADIATION DOSE REDUCTION: This exam was performed according to the departmental dose-optimization program which includes automated exposure control, adjustment of the mA and/or kV according to patient size and/or use of iterative reconstruction technique. COMPARISON:  Neck CT 02/11/2023 FINDINGS: Alignment: Maintained cervical lordosis, stable from last month. Cervicothoracic junction alignment is within normal limits. Bilateral posterior element alignment is within normal limits. Skull base and vertebrae: Visualized skull base is intact. No atlanto-occipital dissociation. Stable bone mineralization. C1 and C2 appear intact and aligned. No acute osseous abnormality identified. Soft tissues and spinal canal: No prevertebral fluid or swelling. No visible canal hematoma. Stable visible neck soft tissues including posterior right parotid space nodule reportedly biopsied and found to be Warthin's tumor. Disc levels: Cervical spine degeneration appears very mild for age at most levels. There is chronic disc and endplate degeneration at C5-C6. Upper chest: Stable.  Partially visible emphysema. Other: Cervicomedullary junction is within normal limits. Negative visible noncontrast posterior fossa. Visible tympanic cavities and mastoids are clear. IMPRESSION: 1. No acute osseous abnormality identified in the cervical spine. Mild for age cervical spine degeneration, maximal at C5-C6. 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 3. Emphysema. Electronically Signed   By: Odessa Fleming M.D.   On: 03/03/2023 11:52   CT Soft Tissue Neck W Contrast  Result Date: 02/11/2023 CLINICAL DATA:  Parotid region mass right parotid mass on PET  CT. EXAM: CT NECK WITH CONTRAST TECHNIQUE: Multidetector CT imaging of the neck was performed using the standard protocol following the bolus administration of intravenous contrast. RADIATION DOSE REDUCTION: This exam was performed according to the departmental dose-optimization program which includes automated exposure control, adjustment of the mA and/or kV according to patient size and/or use of iterative reconstruction technique. CONTRAST:  75mL OMNIPAQUE IOHEXOL 300 MG/ML  SOLN COMPARISON:  PET-CT 10/24/2022. Right parotid biopsy 11/29/2022. Chest CT 12/30/2022. FINDINGS: Pharynx and larynx: Normal. No mass or swelling. Salivary glands: Unchanged solid mass in the inferior right parotid gland, measuring up to 1.5 cm with central cystic or necrotic component, consistent with biopsy-proven Warthin's tumor. Thyroid: Normal. Lymph nodes: No suspicious cervical lymphadenopathy. Previously seen mildly hypermetabolic left level 2A lymph nodes are not pathologic by size or morphologic criteria. Vascular: Unremarkable. Limited intracranial: Unremarkable. Visualized orbits: Normal. Mastoids and visualized paranasal sinuses: Well aerated. Skeleton: Chronic fractures of the C6 and C7 spinous processes. Upper chest: Continued improvement in the consolidative opacity in the left upper lobe. Other: None. IMPRESSION: 1. Unchanged solid mass in the inferior right parotid gland, measuring up to 1.5 cm with central cystic or necrotic component, consistent with biopsy-proven Warthin's tumor. 2. No suspicious cervical lymphadenopathy. Previously seen mildly hypermetabolic left level 2A lymph nodes are not pathologic by size or morphologic criteria. 3. Continued improvement in the consolidative opacity in the left upper lobe. Electronically Signed   By: Orvan Falconer M.D.   On: 02/11/2023 10:13    Administration History     None          Latest Ref Rng & Units 03/04/2023   12:48 PM  PFT Results  FVC-Pre L 1.91  P   FVC-Predicted Pre % 66  P  FVC-Post L 2.24  P  FVC-Predicted Post % 78  P  Pre FEV1/FVC % % 67  P  Post FEV1/FCV % % 70  P  FEV1-Pre L 1.28  P  FEV1-Predicted Pre % 59  P  FEV1-Post L 1.56  P  DLCO uncorrected ml/min/mmHg 10.39  P  DLCO UNC% % 56  P  DLCO corrected ml/min/mmHg 10.39  P  DLCO COR %Predicted % 56  P  DLVA Predicted % 58  P  TLC L 5.60  P  TLC % Predicted % 117  P  RV % Predicted % 162  P    P Preliminary result    No results found for: "NITRICOXIDE"      Assessment & Plan:   No problem-specific Assessment & Plan notes found for this encounter.     Rubye Oaks, NP 03/04/2023

## 2023-03-04 NOTE — Progress Notes (Signed)
Full PFT performed today. °

## 2023-03-04 NOTE — Patient Instructions (Addendum)
Begin Stiolto 2 puffs daily.  Work on not vaping.  Aspiration precautions  GERD diet  Continue on Protonix Twice daily   Activity as tolerated.  CT chest as planned in February.  Follow up with Dr. Delton Coombes  in 4 months and As needed

## 2023-03-04 NOTE — Assessment & Plan Note (Signed)
moderate COPD with significant reversibility.  Suspect she has asthma component as she has reversibility on PFTs.  Encouraged her on vaping cessation.  Will begin Stiolto 2 puffs daily.  Plan  Patient Instructions  Begin Stiolto 2 puffs daily.  Work on not vaping.  Aspiration precautions  GERD diet  Continue on Protonix Twice daily   Activity as tolerated.  CT chest as planned in February.  Follow up with Dr. Delton Coombes  in 4 months and As needed

## 2023-03-04 NOTE — Progress Notes (Signed)
Patient seen in the office today and instructed on use of Stiolto.  Patient expressed understanding and demonstrated technique.

## 2023-03-04 NOTE — Assessment & Plan Note (Signed)
CT chest shows decreased left upper lobe lung mass.  Patient encouraged on vaping cessation.  Aspiration precautions.  Continue on PPI and GERD diet  Plan  Patient Instructions  Begin Stiolto 2 puffs daily.  Work on not vaping.  Aspiration precautions  GERD diet  Continue on Protonix Twice daily   Activity as tolerated.  CT chest as planned in February.  Follow up with Dr. Delton Coombes  in 4 months and As needed

## 2023-03-04 NOTE — Patient Instructions (Signed)
Full PFT performed today. °

## 2023-03-07 ENCOUNTER — Telehealth: Payer: Self-pay

## 2023-03-07 ENCOUNTER — Other Ambulatory Visit: Payer: Self-pay | Admitting: Hematology and Oncology

## 2023-03-07 MED ORDER — OXYCODONE-ACETAMINOPHEN 5-325 MG PO TABS: 2.0000 | ORAL_TABLET | Freq: Three times a day (TID) | ORAL | 0 refills | Status: DC | PRN

## 2023-03-07 NOTE — Progress Notes (Signed)
Refill requested for percocet, previously filled by Dr Pamelia Hoit for arthralgias caused secondary to letrozole. From my understanding, pt was requested to hold letrozole but apparently hasn't done so. Given potential for withdrawal, we will refill the percocet while he is away and she will meet with Dr Pamelia Hoit when he returns to discuss further about this.  [name]

## 2023-03-07 NOTE — Telephone Encounter (Signed)
Pt called, tearful, verbalized concern for not having a refill on Percocet per MD Pamelia Hoit and NP. Pt voiced concern about stopping medication without a taper. She was educated on short-term use and how this medication is not typically used for chronic pain, long term use. She was advised to stop Letrozole per MD Pamelia Hoit. She verbalized concern about stopping letrozole d/t fear of recurrence/mets. She was advised to stop for 2 weeks then come in 03/21/23 at 0945 to see MD to further discuss pain and if stopping letrozole helped alleviate some of her pain.  She also verbalized that she saw a rheumatologist recently who offered a referral to pain mgmt but at the time she declined. Advised pt to accept offer for pain mgmt to help in relieving chronic pain. She will call rheum and is in agreement with MD visit with Gud[facility] 11/1. Dr Al Pimple sent in a refill for Percocet for 2 weeks until Dr Pamelia Hoit returns.

## 2023-03-11 ENCOUNTER — Ambulatory Visit: Payer: Medicare HMO | Admitting: Adult Health

## 2023-03-20 ENCOUNTER — Encounter (INDEPENDENT_AMBULATORY_CARE_PROVIDER_SITE_OTHER): Payer: Self-pay | Admitting: Otolaryngology

## 2023-03-20 ENCOUNTER — Ambulatory Visit (INDEPENDENT_AMBULATORY_CARE_PROVIDER_SITE_OTHER): Payer: Medicare HMO | Admitting: Otolaryngology

## 2023-03-20 VITALS — BP 125/76 | HR 68

## 2023-03-20 DIAGNOSIS — R0981 Nasal congestion: Secondary | ICD-10-CM | POA: Diagnosis not present

## 2023-03-20 DIAGNOSIS — Z853 Personal history of malignant neoplasm of breast: Secondary | ICD-10-CM

## 2023-03-20 DIAGNOSIS — K118 Other diseases of salivary glands: Secondary | ICD-10-CM

## 2023-03-20 DIAGNOSIS — R1312 Dysphagia, oropharyngeal phase: Secondary | ICD-10-CM

## 2023-03-20 DIAGNOSIS — D119 Benign neoplasm of major salivary gland, unspecified: Secondary | ICD-10-CM | POA: Diagnosis not present

## 2023-03-20 DIAGNOSIS — K219 Gastro-esophageal reflux disease without esophagitis: Secondary | ICD-10-CM | POA: Diagnosis not present

## 2023-03-20 DIAGNOSIS — J3089 Other allergic rhinitis: Secondary | ICD-10-CM

## 2023-03-20 DIAGNOSIS — R0982 Postnasal drip: Secondary | ICD-10-CM

## 2023-03-20 NOTE — Progress Notes (Signed)
ENT CONSULT:  Update 03/20/23 She returns for follow-up after CT neck with contrast.  She started Flonase but was not able to initiate Clarinex.  She reports that she had a lung biopsy done and per final report there is no evidence of cancer.  She is seeing Oncology, Dr Pamelia Hoit for continued breast cancer surveillance and treatment, has an appointment tomorrow. She needs hip replacement surgery.    Initial evaluation 01/30/2023  Reason for Consult: parotid mass bx (+) Warthin's tumor and concern for aspiration GERD   HPI: [name] is an 67 y.o. female with hx of breast cancer diagnosed 2015, right side, treated with chemo then mastectomy, no other post-op medications, currently undergoing workup for left upper lobe lung consolidation with biopsy negative for malignancy thus far, followed by both pulmonary and oncology, here for evaluation of incidentally noted right parotid mass on PET/CT.  Of note patient was also evaluated for aspiration due to concern that lung pathology is caused by chronic aspiration, and modified barium swallow did not demonstrate aspiration.  She was sent to GI for evaluation by her oncologist.  She denies fevers or chills.  She denies weight loss.  Reports tolerating regular diet currently.  She has history of acid reflux and reports heartburn symptoms, she was started on PPI and famotidine and was advised to follow diet and lifestyle changes to minimize reflux.  This appears to decrease frequency of heartburn symptoms.  She stopped smoking 1.5 yrs ago, used to smoke for 20 yrs 2 PPD.  Currently vaping.  She also reports chronic nasal congestion with postnasal drainage and sensation of inability to breathe through her nose.  Reports average sense of smell.  She has postnasal drainage.  Currently not on any medications for the symptoms.  She reports remote history of sinus surgery and septum surgery 1986.  She has history of SVT and cannot take medications that affect heart  rate, on atenolol.  Records Reviewed:  Office note by Dr Delton Coombes Pulmonary 01/08/23 Lipoid pneumonia (HCC) Repeat CT chest was reassuring with the decrease in size of the previously observed left and right sided infiltrates.  I will repeat another scan in 6 months to ensure full resolution.  After that we may not need to continue to do dedicated serial follow-up.  Did speak with her today about the risk associated with lipid vaping, aspiration.  She does have significant GERD and has had what sounds like aspiration and choking of reflux at night.  This may be the cause of her lipoid pneumonia.  She is going to see GI.  In the meantime her PPI was recently increased to twice daily, agree with this.  Patient communication Dr Pamelia Hoit Oncology 01/03/23 I called the patient and discussed the results of the CT chest and the swallowing study. She has an appointment to speak with Dr. Delton Coombes about the scan results. She would like to get a hospital bed and will request Dr. Delton Coombes if he would be able to write for that. For her acid reflux and Barrett's: She will discuss with gastroenterology to get another endoscopy.   Past Medical History:  Diagnosis Date   Allergy    Phreesia 03/21/2020   Anemia    Phreesia 03/21/2020   Anxiety    Arthritis    [employer]reesia 03/21/2020   Blood transfusion without reported diagnosis    [facility]reesia 03/21/2020   Cancer (HCC)    right breast cancer   Depression    Depression    [facility]reesia 03/21/2020  Fibromyalgia    GERD (gastroesophageal reflux disease)    [employer]reesia 03/21/2020   Heavy smoker    Hepatitis C    Pt was given too much interferon - critical care x2d, 5 days total hosp   Neuromuscular disorder (HCC)    Phreesia 03/21/2020   Osteoporosis    Perimenopausal 11/18/2003   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), hypoglycemia 2/04-resolved with PO calcium + D, SVT, was treated with iterferon and  ribavirin-10/3-2/04   Personal history of chemotherapy    PUD (peptic ulcer disease)    Rheumatoid arthritis(714.0)    possible   SVT (supraventricular tachycardia) (HCC)    Wears glasses     Past Surgical History:  Procedure Laterality Date   ABDOMINAL HYSTERECTOMY     BREAST SURGERY N/A    Phreesia 03/21/2020   CESAREAN SECTION  09/17/1980   CESAREAN SECTION N/A    Phreesia 03/21/2020   COLONOSCOPY     DILATION AND CURETTAGE OF UTERUS     hysterectomy-for dysmenorrhea  09/17/2000   JOINT REPLACEMENT N/A    Phreesia 03/21/2020   MASTECTOMY Right 2016   MASTECTOMY W/ SENTINEL NODE BIOPSY Right 07/28/2014   Procedure: RIGHT MASTECTOMY WITH SENTINEL LYMPH NODE BIOPSY ;  Surgeon: Almond Lint, MD;  Location: [facility];  Service: General;  Laterality: Right;   NASAL SINUS SURGERY  03/20/1984   for deviated septum   PORTACATH PLACEMENT Left 01/20/2014   Procedure: INSERTION PORT-A-CATH;  Surgeon: Almond Lint, MD;  Location: [facility];  Service: General;  Laterality: Left;   TONSILLECTOMY  05/20/1961   UPPER GI ENDOSCOPY      Family History  Problem Relation Age of Onset   Dementia Mother    Hypertension Mother    Hyperlipidemia Mother    Cancer Father 39       lung   Dementia Father    Breast cancer Maternal Aunt    Cancer Paternal Uncle    Breast cancer Cousin    Colon cancer Neg Hx     Social History:  reports that she has been smoking. She has never used smokeless tobacco. She reports current alcohol use. She reports that she does not use drugs.  Allergies:  Allergies  Allergen Reactions   Codeine Itching and Nausea Only    Pt has had codeine with pre-administration to prevent reaction without any problem.  Other Reaction(s): nausea and vomiting, itching    Medications: I have reviewed the patient's current medications.  The PMH, PSH, Medications, Allergies, and SH were reviewed and updated.  ROS: Constitutional: Negative  for fever, weight loss and weight gain. Cardiovascular: Negative for chest pain and dyspnea on exertion. Respiratory: Is not experiencing shortness of breath at rest. Gastrointestinal: Negative for nausea and vomiting. Neurological: Negative for headaches. Psychiatric: The patient is not nervous/anxious  Blood pressure 125/76, pulse 68, SpO2 95%.  PHYSICAL EXAM:  Exam: General: Well-developed, well-nourished Communication and Voice: raspy voice  Respiratory Respiratory effort: Equal inspiration and expiration without stridor Cardiovascular Peripheral Vascular: Warm extremities with equal color/perfusion Eyes: No nystagmus with equal extraocular motion bilaterally Neuro/Psych/Balance: Patient oriented to person, place, and time; Appropriate mood and affect; Gait is intact with no imbalance; Cranial nerves II-XII are intact Head and Face Inspection: Normocephalic and atraumatic without mass or lesion Palpation: Facial skeleton intact without bony stepoffs Salivary Glands: Right parotid tail mass, below the level of the mandible ~ 2 cm in size, no overlying skin changes  Facial Strength: Facial motility symmetric and full bilaterally ENT Pinna: External ear intact and fully developed External canal: Canal is patent with intact skin Tympanic Membrane: Clear and mobile External Nose: No scar or anatomic deformity Internal Nose: Septum is deviated to the left with S shaped septum and narrow nasal passages. No polyp, or purulence. Mucosal edema and erythema present.  Bilateral inferior turbinate hypertrophy.  Lips, Teeth, and gums: Mucosa and teeth intact and viable Oral cavity/oropharynx: No erythema or exudate, no lesions present Neck Neck and Trachea: Midline trachea without mass or lesion  Procedure: Preoperative diagnosis: parotid/neck mass concern for dysphagia/aspiration GERD LPR  Postoperative diagnosis:   Same  Procedure: Flexible fiberoptic laryngoscopy  Surgeon: Ashok Croon, MD  Anesthesia: Topical lidocaine and Afrin Complications: None Condition is stable throughout exam  Indications and consent:  The patient presents to the clinic with Indirect laryngoscopy view was incomplete. Thus it was recommended that they undergo a flexible fiberoptic laryngoscopy. All of the risks, benefits, and potential complications were reviewed with the patient preoperatively and verbal informed consent was obtained.  Procedure: The patient was seated upright in the clinic. Topical lidocaine and Afrin were applied to the nasal cavity. After adequate anesthesia had occurred, I then proceeded to pass the flexible telescope into the nasal cavity. The nasal cavity was patent without rhinorrhea or polyp. The nasopharynx was also patent without mass or lesion. The base of tongue was visualized and was normal. There were no signs of pooling of secretions in the piriform sinuses. The true vocal folds were mobile bilaterally. There were no signs of glottic or supraglottic mucosal lesion or mass. There was moderate interarytenoid pachydermia and post cricoid edema. The telescope was then slowly withdrawn and the patient tolerated the procedure throughout.   Studies Reviewed: MBS done 01/02/23 HPI/PMH: HPI: Pt is a 67 year old female arriving for an OP MBS referred by pulmonology due to following concern " LUL mass, was treated for clinical PNA. Results showed lipoid PNA. Aspiration is a potential cause. Also, she continues to vape, which can cause lipoid PNA." Pt has a history of COPD and complains of nighttime GERD     Clinical Impression: Clinical Impression: Pt demosntrates normal swallow function. No dysphagia or aspiration. Pt denies complaint except for night time reflux. Recommend no SLP f/u, pt to continue diet.  CT chest w/o contrast 12/30/22 IMPRESSION: 1. The left upper lobe area of nodular consolidation is decreased compared to the PET of 10/24/2022. The right lower lobe  lung nodule is also decreased in size and prevascular hypermetabolic node is similar to minimally decreased. 2. No thoracic adenopathy or findings to suggest metastatic disease. 3. Incidental findings, including: Aortic atherosclerosis (ICD10-I70.0), coronary artery atherosclerosis and emphysema (ICD10-J43.9). Tiny hiatal hernia.  10/24/2022 NM PET/CT EXAM: NUCLEAR MEDICINE PET SKULL BASE TO THIGH   TECHNIQUE: 8.8 mCi F-18 FDG was injected intravenously. Full-ring PET imaging was performed from the skull base to thigh after the radiotracer. CT data was obtained and used for attenuation correction and anatomic localization.   Fasting blood glucose: 114 mg/dl   COMPARISON:  CT chest 03/20/2015.   FINDINGS: Mediastinal blood pool activity: SUV max 2.7   Liver activity: SUV max NA   NECK:   Hypermetabolic right parotid nodule measures 15 mm (4/31), SUV max 16.0. Small hypermetabolic left level 2 lymph nodes. Index lymph node measures 5 mm (4/29), SUV max 3.7.   Incidental CT findings:   Mucosal thickening in the left maxillary sinus.  CHEST:   Small AP window lymph node measures 5 mm (4/66), SUV max 3.4. Hypermetabolic mass in the anterior segment left upper lobe measures 3.1 x 4.6 cm (7/21), SUV max 16.9. Additional hypermetabolic pulmonary nodules extend to the right hemithorax. Index nodule in the medial right lower lobe measures 8 x 11 mm (7/29), SUV max 6.9.   Incidental CT findings:   Left IJ Port-A-Cath terminates in the SVC. Atherosclerotic calcification of the aorta, aortic valve and coronary arteries. Heart is enlarged. No pericardial or pleural effusion. Centrilobular and paraseptal emphysema.   ABDOMEN/PELVIS:   No abnormal hypermetabolism.   Incidental CT findings:   Liver, gallbladder, adrenal glands, kidneys, spleen pancreas, stomach and bowel are grossly unremarkable.   SKELETON:   No focal abnormal hypermetabolism.   Incidental CT  findings:   Right hip arthroplasty.  Degenerative changes in the spine.   IMPRESSION: 1. Hypermetabolic left upper lobe mass with hypermetabolic nodules in the right hemithorax and a hypermetabolic ipsilateral mediastinal lymph node. Findings are worrisome for T2bN2M1a or stage IVA disease. If the hypermetabolic left cervical lymph nodes are involved, findings would be compatible with stage IVB disease. Difficult to definitively exclude an infectious/inflammatory process but considered less likely. Comparison with reported lung cancer screening CT performed at [facility] would be helpful in this regard. 2. Hypermetabolic right parotid nodule, malignancy cannot be excluded. 3. Aortic atherosclerosis (ICD10-I70.0). Coronary artery calcification. 4.  Emphysema (ICD10-J43.9).   Surg Path LUL mass biopsy FINAL MICROSCOPIC DIAGNOSIS:   A. LEFT LUNG, MASS, BIOPSY:  Benign alveolated lung with fibrosis and acute and chronic inflammation  with multinucleate histiocytes compatible with lipoid pneumonia (see  comment)  Acid-fast bacilli and fungal stains negative (AFB, GMS; adequate  controls)  Negative for tumor, capillaritis, necrosis and granulomas   FINAL MICROSCOPIC DIAGNOSIS:   A. PAROTID MASS, HM RIGHT, BIOPSY:  - Warthin's tumor   MBS 01/02/23 Pt is a 67 year old female arriving for an OP MBS referred by pulmonology due to following concern " LUL mass, was treated for clinical PNA. Results showed lipoid PNA. Aspiration is a potential cause. Also, she continues to vape, which can cause lipoid PNA." Pt has a history of COPD and complains of nighttime GERD     Clinical Impression: Clinical Impression: Pt demosntrates normal swallow function. No dysphagia or aspiration. Pt denies complaint except for night time reflux. Recommend no SLP f/u, pt to continue diet.   Factors that may increase risk of adverse event in presence of aspiration Rubye Oaks & Clearance Coots 2021): No data recorded    CT  neck with contrast 02/11/2023  Assessment/Plan: Encounter Diagnoses  Name Primary?   Nasal congestion    History of breast cancer    Gastroesophageal reflux disease without esophagitis [K21.9]    Parotid mass Yes   Environmental and seasonal allergies    Post-nasal drainage    Oropharyngeal dysphagia     67 year old female with history of breast cancer currently followed by Dr. Pamelia Hoit Oncology and Dr Delton Coombes pulmonary, who was noted to have right parotid mass on recent PET/CT.  Of note she is also currently undergoing workup for Left upper lobe nodular consolidation noted on both PET/CT and CT chest as well as right lower lobe nodule.  The findings were initially thought to represent pneumonia and were clinically treated as such, with subsequent decrease in findings on repeat imaging.  There was a concern for aspiration but she had modified barium swallow which demonstrated normal swallowing function and no aspiration per  report.  She is here for evaluation.  Record review indicates she was also referred to GI for GERD.   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  She denies any symptoms pertaining to the mass.  Has history of smoking but quit recently and currently only does vaping.  - schedule neck CT with contrast  2. She reports chronic longstanding nasal congestion currently not on any medications for it, exam today which included flexible laryngoscopy demonstrated nasal mucosal edema clear secretions but no purulence or pus.  There was evidence of clear secretions on both sides of the nasal passages.  Her septum was deviated.  There was postnasal drainage present as well.  Symptoms are likely due to undiagnosed environmental allergies versus vasomotor rhinitis -  start Clarinex and Flonase (for nasal congestion)  -Will review CT neck when done specifically will also note if there is any mucosal thickening in the sinuses to rule out chronic sinus inflammation  - see Pulmonary Oncology and GI as scheduled   3.  GERD LPR, concern for dysphagia and aspiration  -I reviewed results of modified barium swallow and there appears to be no evidence of aspiration at the time of the swallow study.  Flexible laryngoscopy exam today did not demonstrate pooling of secretions in hypopharynx, her vocal folds were mobile bilaterally, and there was evidence of moderate to severe postcricoid edema and pachydermia consistent with GERD LPR.  She is already on reflux medications including famotidine and Protonix, and scheduled to see GI.  I advised her to continue medications, and discussed de-escalation of PPI therapy with GI physician when she sees them.  - add reflux gourmet to reflux medications -Diet and lifestyle changes to minimize reflux symptoms   Workup and management of lung pathology is ongoing, and I discussed with the patient that although we are going to obtain dedicated CT neck to better assess right parotid mass, surgical management of the mass can occur after completion of her workup for suspected pneumonia versus malignancy in her lungs.  She will return after testing, and we will discuss the best timing for partial parotidectomy.  All questions have been answered.  Update 03/20/23 She turns after CT neck which demonstrated unchanged solid mass in the inferior parotid gland measuring approximately 1-1/2 cm with central cystic component and no cervical lymphadenopathy.  Additionally previously seen level 2A node on the left did not show pathologic characteristics or morphology, there was also improvement in opacity in the left upper lobe.  We reviewed the results of the scan today.  She continues to have no facial weakness on exam, and right tail of the parotid mass  is palpable on exam and appears to be stable in size.   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 no pathologic lymphadenopathy on neck CT with contrast -We discussed risks and benefits of superficial parotidectomy for tissue diagnosis and to remove the mass, I also explained that the procedure can be performed after she is done with her upcoming hip surgery and workup for lung pathology -Will plan to see her in a few months after she sees oncology and Ortho and schedule right parotidectomy at the time of her follow-up visit if she decides to proceed -  If she elects to hold off on surgical interventions we will plan for repeat imaging in 6 to 12 months to make sure the mass remained stable -Will require preop clearance due to multiple comorbidities and ongoing treatment for breast cancer which includes letrozole  2.  GERD LPR and symptoms concerning for laryngospasm -Already on Pepcid and pantoprazole daily, describes episodes of waking up and coughing and choking at night -this could be related to untreated GERD LPR versus laryngospasm from untreated GERD LPR -Diet and lifestyle changes to minimize reflux -Trial of reflux Gourmet  3.  Prior concern for aspiration however her MBS showed normal swallowing, without aspiration or penetration -Patient was reassured  4.  Chronic nasal congestion and postnasal drainage -Continue Flonase twice daily -Consider daily antihistamine  RTC 4 months  Ashok Croon, MD Otolaryngology [facility] Phone: [phone] Fax: [phone]    03/20/2023, 6:46 PM

## 2023-03-21 ENCOUNTER — Inpatient Hospital Stay: Payer: Medicare HMO | Attending: Hematology and Oncology | Admitting: Hematology and Oncology

## 2023-03-21 VITALS — BP 121/61 | HR 62 | Temp 97.7°F | Resp 16 | Ht 62.0 in | Wt 179.1 lb

## 2023-03-21 DIAGNOSIS — Z9221 Personal history of antineoplastic chemotherapy: Secondary | ICD-10-CM | POA: Insufficient documentation

## 2023-03-21 DIAGNOSIS — Z885 Allergy status to narcotic agent status: Secondary | ICD-10-CM | POA: Insufficient documentation

## 2023-03-21 DIAGNOSIS — Z79811 Long term (current) use of aromatase inhibitors: Secondary | ICD-10-CM | POA: Diagnosis not present

## 2023-03-21 DIAGNOSIS — G8929 Other chronic pain: Secondary | ICD-10-CM | POA: Diagnosis not present

## 2023-03-21 DIAGNOSIS — Z9011 Acquired absence of right breast and nipple: Secondary | ICD-10-CM | POA: Insufficient documentation

## 2023-03-21 DIAGNOSIS — C50411 Malignant neoplasm of upper-outer quadrant of right female breast: Secondary | ICD-10-CM | POA: Diagnosis present

## 2023-03-21 DIAGNOSIS — K118 Other diseases of salivary glands: Secondary | ICD-10-CM | POA: Diagnosis not present

## 2023-03-21 DIAGNOSIS — M069 Rheumatoid arthritis, unspecified: Secondary | ICD-10-CM | POA: Insufficient documentation

## 2023-03-21 DIAGNOSIS — Z79899 Other long term (current) drug therapy: Secondary | ICD-10-CM | POA: Insufficient documentation

## 2023-03-21 DIAGNOSIS — Z170 Estrogen receptor positive status [ER+]: Secondary | ICD-10-CM | POA: Diagnosis not present

## 2023-03-21 DIAGNOSIS — M1650 Unilateral post-traumatic osteoarthritis, unspecified hip: Secondary | ICD-10-CM | POA: Diagnosis not present

## 2023-03-21 MED ORDER — OXYCODONE-ACETAMINOPHEN 5-325 MG PO TABS: 2.0000 | ORAL_TABLET | Freq: Three times a day (TID) | ORAL | 0 refills | Status: DC | PRN

## 2023-03-21 NOTE — Assessment & Plan Note (Signed)
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 Status post neoadjuvant chemotherapy with TCHPx2, GCHPx1, CHPx1, HPx2 , complete pathologic response with only residual DCIS 7 cm remaining. Anastrozole started 08/19/2014 stopped 09/18/2014, Herceptin stopped April 2016    PET CT scan 10/28/2022: Hypermetabolic left upper lobe mass (3.1 x 4.6 cm) with hypermetabolic nodules in the right hemithorax (11 mm) and hypermetabolic ipsilateral mediastinal lymph node (AP window lymph node 5 mm). 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 Delton Coombes.  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   Current treatment: Letrozole Letrozole toxicities: tolerating it well   Pain issues: Currently on prednisone 10 mg daily for rheumatoid arthritis along with Percocets. Follow-up after surgery on the parotid gland to discuss the final pathology report.   Pain in the hands and feet: I instructed her to call rheumatology and get an appointment.  In the meantime she will continue 10 mg of prednisone.  I also encouraged her to use turmeric.  Return to clinic in 6 months for follow-up

## 2023-03-21 NOTE — Progress Notes (Signed)
Patient Care Team: Modesta Messing as PCP - General (Physician Assistant) Lewayne Bunting, MD as PCP - Cardiology (Cardiology) Serena Croissant, MD as Consulting Physician (Hematology and Oncology) Almond Lint, MD as Consulting Physician (General Surgery) Lurline Hare, MD as Consulting Physician (Radiation Oncology) Sheran Luz, MD as Consulting Physician (Physical Medicine and Rehabilitation) Jens Som Madolyn Frieze, MD as Consulting Physician (Cardiology)  DIAGNOSIS:  Encounter Diagnosis  Name Primary?   Malignant neoplasm of upper-outer quadrant of right breast in female, estrogen receptor positive (HCC) Yes    SUMMARY OF ONCOLOGIC HISTORY: Oncology History  Breast cancer of upper-outer quadrant of right female breast (HCC)  12/16/2013 Mammogram   1. A palpable 4.1 cm diameter mass/asymmetry with associated coarse heterogeneous microcalcifications at 11 o'clock 2 cm from the right nipple demonstrates mammographic and sonographic features highly suggestive of malignancy.    12/16/2013 Initial Biopsy   Breast cancer of upper-outer quadrant of right female breast; Invasive Ductal Cancer Er 100%, PR: 0%; Her2 Positive Ratio 4.28: Ki 67:24%;    12/23/2013 Breast MRI   Right Breast 4.4 x 3 x 2.5 cm and a sub cm satellite lesion. No LN   01/28/2014 - 05/27/2014 Neo-Adjuvant Chemotherapy   TC HP x2 cycles ( developed hand-foot syndrome) GC HP x1 cycle (developed dehydration and renal failure) CHP from 04/08/2014   07/21/2014 - 09/01/2014 Chemotherapy   Herceptin maintenance therapy   07/28/2014 Surgery   Right breast mastectomy: DCIS 7 cm, 4 lymph nodes negative, no invasive breast cancer was identified, complete pathologic response   08/19/2014 - 04/20/2015 Anti-estrogen oral therapy   Anastrozole 1 mg daily stopped 09/23/2014 due to diffuse body aches, diarrhea, nausea, fatigue we switched her to tamoxifen in 03/22/2015 stopped after 2 weeks with diarrhea, tiredness and nausea      CHIEF COMPLIANT: Follow-up of chronic pain with acute exacerbation History of Present Illness   The patient, with a history of breast cancer and rheumatoid arthritis, severe osteoarthritis, and a parotid gland issue, presents with concerns about her overall pain management and her various health issues. She recently experienced a fall, resulting in a fractured hip that requires pin placement. She reports severe pain and discomfort, which she has been managing with oxycodone. However, she expresses distress over a recent conversation with the medical team about discontinuing her pain medication.  In addition to her hip fracture, the patient has been diagnosed with rheumatoid arthritis and severe osteoarthritis. She reports constant pain and stiffness in her hands, which she states is not alleviated by discontinuing her letrozole medication. She also mentions a recent diagnosis of a parotid gland issue, which is being monitored closely due to concerns about potential malignancy.     ALLERGIES:  is allergic to codeine.  MEDICATIONS:  Current Outpatient Medications  Medication Sig Dispense Refill   ALPRAZolam (XANAX) 0.5 MG tablet Take 0.5 mg by mouth 2 (two) times daily.     atenolol (TENORMIN) 50 MG tablet TAKE 1 TABLET(50 MG) BY MOUTH TWICE DAILY (Patient taking differently: Take 50 mg by mouth daily. TAKE 2 TABLET(50 MG) BY MOUTH TWICE DAILY) 180 tablet 3   B Complex-C (B-COMPLEX WITH VITAMIN C) tablet Take 1 tablet by mouth 2 (two) times a week.     famotidine (PEPCID) 40 MG tablet Take 40 mg by mouth at bedtime.     fluticasone (FLONASE) 50 MCG/ACT nasal spray Place 2 sprays into both nostrils daily. 16 g 6   gabapentin (NEURONTIN) 600 MG tablet Take 600 mg by  mouth 3 (three) times daily.     ibuprofen (ADVIL,MOTRIN) 600 MG tablet Take 1 tablet (600 mg total) by mouth every 6 (six) hours as needed. (Patient not taking: Reported on 03/20/2023) 30 tablet 0   letrozole (FEMARA) 2.5 MG tablet  Take 1 tablet (2.5 mg total) by mouth daily. (Patient not taking: Reported on 03/20/2023) 90 tablet 3   Misc Natural Products (TUMERSAID) TABS as directed Orally     Multiple Vitamins tablet Take by mouth.     Multiple Vitamins-Minerals (WOMENS MULTIVITAMIN PO) Take 1 tablet by mouth 3 (three) times a week.     ondansetron (ZOFRAN) 8 MG tablet Take 1 tablet (8 mg total) by mouth every 8 (eight) hours as needed for nausea or vomiting. (Patient not taking: Reported on 03/20/2023) 30 tablet 3   oxyCODONE-acetaminophen (PERCOCET/ROXICET) 5-325 MG tablet Take 2 tablets by mouth every 8 (eight) hours as needed for severe pain (pain score 7-10). 180 tablet 0   pantoprazole (PROTONIX) 40 MG tablet Take 40 mg by mouth 2 (two) times daily.     predniSONE (DELTASONE) 10 MG tablet Take 1 tablet (10 mg total) by mouth daily with breakfast. 90 tablet 0   sertraline (ZOLOFT) 25 MG tablet Take by mouth.     Tiotropium Bromide-Olodaterol (STIOLTO RESPIMAT) 2.5-2.5 MCG/ACT AERS Inhale 2 puffs into the lungs daily. (Patient not taking: Reported on 03/20/2023) 1 each 5   Turmeric (QC TUMERIC COMPLEX PO) Take 1 tablet by mouth daily.     No current facility-administered medications for this visit.    PHYSICAL EXAMINATION: ECOG PERFORMANCE STATUS: 1 - Symptomatic but completely ambulatory  Vitals:   03/21/23 1007  BP: 121/61  Pulse: 62  Resp: 16  Temp: 97.7 F (36.5 C)  SpO2: 99%   Filed Weights   03/21/23 1007  Weight: 179 lb 1.6 oz (81.2 kg)      LABORATORY DATA:  I have reviewed the data as listed    Latest Ref Rng & Units 05/19/2021   12:25 PM 03/24/2020   10:41 AM 02/03/2017    1:13 PM  CMP  Glucose 70 - 99 mg/dL 94  696  295   BUN 8 - 23 mg/dL 10  11  <5   Creatinine 0.44 - 1.00 mg/dL 2.84  1.32  4.40   Sodium 135 - 145 mmol/L 133  131  136   Potassium 3.5 - 5.1 mmol/L 3.2  4.8  3.3   Chloride 98 - 111 mmol/L 103  97  97   CO2 22 - 32 mmol/L 19  19  26    Calcium 8.9 - 10.3 mg/dL 8.6   9.3  9.3   Total Protein 6.0 - 8.5 g/dL  7.7    Total Bilirubin 0.0 - 1.2 mg/dL  0.3    Alkaline Phos 44 - 121 IU/L  147    AST 0 - 40 IU/L  14    ALT 0 - 32 IU/L  14      Lab Results  Component Value Date   WBC 7.7 05/19/2021   HGB 13.0 05/19/2021   HCT 40.3 05/19/2021   MCV 83.1 05/19/2021   PLT 302 05/19/2021   NEUTROABS 4.9 03/24/2020    ASSESSMENT & PLAN:  Breast cancer of upper-outer quadrant of right female breast (HCC) Right breast invasive ductal carcinoma 4.4 cm in size with a satellite lesion based on MRI result there were no abnormal lymph nodes ER 100% positive PR 0% HER-2/neu positive (HER-2/CEP 17 ratio 4.28)  grade 3 T2, N0, M0 clinical stage IIa. Status post neoadjuvant chemotherapy with TCHPx2, GCHPx1, CHPx1, HPx2 , complete pathologic response with only residual DCIS 7 cm remaining. Anastrozole started 08/19/2014 stopped 09/18/2014, Herceptin stopped April 2016    PET CT scan 10/28/2022: Hypermetabolic left upper lobe mass (3.1 x 4.6 cm) with hypermetabolic nodules in the right hemithorax (11 mm) and hypermetabolic ipsilateral mediastinal lymph node (AP window lymph node 5 mm). 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 Delton Coombes.  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   Current treatment: Letrozole Letrozole toxicities: tolerating it well   Pain issues: Going to see pain management in January.  Up until then we will continue her pain medication  Hip Fracture Recent fall leading to hip pain and mobility issues. Fracture confirmed by imaging. -Refer for surgical consultation for pin placement.    Parotid Gland Mass Mass has been decreasing in size, moving away from facial nerves. Biopsy results  benign, but radiology reports suggest malignancy. -Resume letrozole to manage potential cancer cells. -Follow-up in four months to reassess mass size and proximity to facial nerves.  Rheumatoid Arthritis (RA) and Severe Osteoarthritis Diagnosed by rheumatologist. Patient reports chronic pain and stiffness. -Continue current management plan. -Consider additional interventions as recommended by rheumatologist.  Medication Adjustment Patient has started on Zoloft 25mg , with plans to increase to 50mg . -Update medication list to include Zoloft.  Follow-up Multiple ongoing health issues requiring close monitoring. -Schedule follow-up appointments as necessary for each condition.          No orders of the defined types were placed in this encounter.  The patient has a good understanding of the overall plan. she agrees with it. she will call with any problems that may develop before the next visit here. Total time spent: 30 mins including face to face time and time spent for planning, charting and co-ordination of care   Tamsen Meek, MD 03/21/23

## 2023-04-15 ENCOUNTER — Ambulatory Visit: Payer: Medicare HMO | Admitting: Hematology and Oncology

## 2023-04-21 ENCOUNTER — Other Ambulatory Visit: Payer: Self-pay | Admitting: Hematology and Oncology

## 2023-04-21 ENCOUNTER — Telehealth: Payer: Self-pay

## 2023-04-21 MED ORDER — OXYCODONE-ACETAMINOPHEN 5-325 MG PO TABS: 2.0000 | ORAL_TABLET | Freq: Three times a day (TID) | ORAL | 0 refills | Status: DC | PRN

## 2023-04-21 NOTE — Telephone Encounter (Signed)
Pt called to request refill for percocet 5-325 2 tabs PO q8h prn qty 180. Pt will see pain mgmt in January, Advised pt request was given to MD. She verbalized thanks and understanding,

## 2023-04-21 NOTE — Telephone Encounter (Signed)
Continue prednisone per November 2024 OV. Lorayne Marek, RN

## 2023-05-12 ENCOUNTER — Other Ambulatory Visit: Payer: Self-pay | Admitting: Hematology and Oncology

## 2023-05-12 ENCOUNTER — Telehealth: Payer: Self-pay

## 2023-05-12 MED ORDER — OXYCODONE-ACETAMINOPHEN 5-325 MG PO TABS: 1.0000 | ORAL_TABLET | Freq: Three times a day (TID) | ORAL | 0 refills | Status: DC | PRN

## 2023-05-12 NOTE — Telephone Encounter (Signed)
Pt called and LVM requesting call back from MD. She spoke extensively about how hewr pain is not being managed well by Percocet 5-325 2 tabs q8h prn, She reports she is often having to take 3 tabs. She sees pain mgmt in January. MD is willing to fill a short fill to get her through until pain management appt but he states after this he will no longer prescribe pain medication.  Pt was asking if Md would be willing to change the medication. Advised per MD he does not feel comfortable changing or increasing her medication, but is willing to do a short fill until she can see Pain Mgmt. Pharmacy may not fill right away given she would be early picking up medication. Advised to go to ED if she finds herself in pain crisis. Total spent talking is 42 mins.

## 2023-05-12 NOTE — Progress Notes (Signed)
Patient calling complaining that she ran out of her medications early because she was using 3 tablets every 8 hours instead of 2 tablets as we had recommended. I will send a short refill for the Percocets until she sees her pain management doctor in the first week of January.

## 2023-05-20 ENCOUNTER — Encounter (HOSPITAL_COMMUNITY): Payer: Self-pay | Admitting: Emergency Medicine

## 2023-05-20 ENCOUNTER — Inpatient Hospital Stay (HOSPITAL_COMMUNITY)
Admission: EM | Admit: 2023-05-20 | Discharge: 2023-05-24 | DRG: 481 | Disposition: A | Discharge status: Skilled Nursing Facility | Payer: Medicare HMO | Attending: Internal Medicine | Admitting: Internal Medicine

## 2023-05-20 ENCOUNTER — Emergency Department (HOSPITAL_COMMUNITY): Payer: Medicare HMO

## 2023-05-20 ENCOUNTER — Other Ambulatory Visit: Payer: Self-pay

## 2023-05-20 DIAGNOSIS — D62 Acute posthemorrhagic anemia: Secondary | ICD-10-CM | POA: Diagnosis not present

## 2023-05-20 DIAGNOSIS — R339 Retention of urine, unspecified: Secondary | ICD-10-CM | POA: Diagnosis not present

## 2023-05-20 DIAGNOSIS — J449 Chronic obstructive pulmonary disease, unspecified: Secondary | ICD-10-CM | POA: Diagnosis present

## 2023-05-20 DIAGNOSIS — D72829 Elevated white blood cell count, unspecified: Secondary | ICD-10-CM | POA: Diagnosis present

## 2023-05-20 DIAGNOSIS — Z9011 Acquired absence of right breast and nipple: Secondary | ICD-10-CM | POA: Diagnosis not present

## 2023-05-20 DIAGNOSIS — K219 Gastro-esophageal reflux disease without esophagitis: Secondary | ICD-10-CM | POA: Diagnosis present

## 2023-05-20 DIAGNOSIS — F32A Depression, unspecified: Secondary | ICD-10-CM | POA: Diagnosis present

## 2023-05-20 DIAGNOSIS — S79912A Unspecified injury of left hip, initial encounter: Secondary | ICD-10-CM | POA: Diagnosis present

## 2023-05-20 DIAGNOSIS — M80052A Age-related osteoporosis with current pathological fracture, left femur, initial encounter for fracture: Principal | ICD-10-CM | POA: Diagnosis present

## 2023-05-20 DIAGNOSIS — K5900 Constipation, unspecified: Secondary | ICD-10-CM | POA: Diagnosis not present

## 2023-05-20 DIAGNOSIS — S72002A Fracture of unspecified part of neck of left femur, initial encounter for closed fracture: Secondary | ICD-10-CM

## 2023-05-20 DIAGNOSIS — M9702XA Periprosthetic fracture around internal prosthetic left hip joint, initial encounter: Secondary | ICD-10-CM | POA: Diagnosis not present

## 2023-05-20 DIAGNOSIS — M069 Rheumatoid arthritis, unspecified: Secondary | ICD-10-CM | POA: Diagnosis present

## 2023-05-20 DIAGNOSIS — Z79899 Other long term (current) drug therapy: Secondary | ICD-10-CM | POA: Diagnosis not present

## 2023-05-20 DIAGNOSIS — Z6832 Body mass index (BMI) 32.0-32.9, adult: Secondary | ICD-10-CM

## 2023-05-20 DIAGNOSIS — W010XXA Fall on same level from slipping, tripping and stumbling without subsequent striking against object, initial encounter: Secondary | ICD-10-CM | POA: Diagnosis present

## 2023-05-20 DIAGNOSIS — Z8781 Personal history of (healed) traumatic fracture: Secondary | ICD-10-CM | POA: Diagnosis not present

## 2023-05-20 DIAGNOSIS — E876 Hypokalemia: Secondary | ICD-10-CM | POA: Diagnosis not present

## 2023-05-20 DIAGNOSIS — J410 Simple chronic bronchitis: Secondary | ICD-10-CM | POA: Diagnosis not present

## 2023-05-20 DIAGNOSIS — Z79811 Long term (current) use of aromatase inhibitors: Secondary | ICD-10-CM

## 2023-05-20 DIAGNOSIS — E785 Hyperlipidemia, unspecified: Secondary | ICD-10-CM | POA: Diagnosis present

## 2023-05-20 DIAGNOSIS — S14101A Unspecified injury at C1 level of cervical spinal cord, initial encounter: Secondary | ICD-10-CM

## 2023-05-20 DIAGNOSIS — M797 Fibromyalgia: Secondary | ICD-10-CM | POA: Diagnosis present

## 2023-05-20 DIAGNOSIS — E559 Vitamin D deficiency, unspecified: Secondary | ICD-10-CM | POA: Diagnosis present

## 2023-05-20 DIAGNOSIS — Z9221 Personal history of antineoplastic chemotherapy: Secondary | ICD-10-CM | POA: Diagnosis not present

## 2023-05-20 DIAGNOSIS — G629 Polyneuropathy, unspecified: Secondary | ICD-10-CM | POA: Diagnosis present

## 2023-05-20 DIAGNOSIS — E871 Hypo-osmolality and hyponatremia: Secondary | ICD-10-CM | POA: Diagnosis present

## 2023-05-20 DIAGNOSIS — W19XXXA Unspecified fall, initial encounter: Secondary | ICD-10-CM | POA: Diagnosis not present

## 2023-05-20 DIAGNOSIS — E669 Obesity, unspecified: Secondary | ICD-10-CM | POA: Diagnosis present

## 2023-05-20 DIAGNOSIS — Z801 Family history of malignant neoplasm of trachea, bronchus and lung: Secondary | ICD-10-CM

## 2023-05-20 DIAGNOSIS — M1990 Unspecified osteoarthritis, unspecified site: Secondary | ICD-10-CM | POA: Diagnosis present

## 2023-05-20 DIAGNOSIS — F419 Anxiety disorder, unspecified: Secondary | ICD-10-CM | POA: Diagnosis present

## 2023-05-20 DIAGNOSIS — Z853 Personal history of malignant neoplasm of breast: Secondary | ICD-10-CM | POA: Diagnosis not present

## 2023-05-20 DIAGNOSIS — Z8249 Family history of ischemic heart disease and other diseases of the circulatory system: Secondary | ICD-10-CM | POA: Diagnosis not present

## 2023-05-20 DIAGNOSIS — Z5982 Transportation insecurity: Secondary | ICD-10-CM

## 2023-05-20 DIAGNOSIS — S7222XA Displaced subtrochanteric fracture of left femur, initial encounter for closed fracture: Secondary | ICD-10-CM | POA: Diagnosis not present

## 2023-05-20 DIAGNOSIS — Z803 Family history of malignant neoplasm of breast: Secondary | ICD-10-CM

## 2023-05-20 DIAGNOSIS — Z96641 Presence of right artificial hip joint: Secondary | ICD-10-CM | POA: Diagnosis present

## 2023-05-20 DIAGNOSIS — F17290 Nicotine dependence, other tobacco product, uncomplicated: Secondary | ICD-10-CM | POA: Diagnosis present

## 2023-05-20 DIAGNOSIS — F17210 Nicotine dependence, cigarettes, uncomplicated: Secondary | ICD-10-CM | POA: Diagnosis not present

## 2023-05-20 LAB — CBC
HCT: 35.1 % — ABNORMAL LOW (ref 36.0–46.0)
Hemoglobin: 11.5 g/dL — ABNORMAL LOW (ref 12.0–15.0)
MCH: 26.7 pg (ref 26.0–34.0)
MCHC: 32.8 g/dL (ref 30.0–36.0)
MCV: 81.4 fL (ref 80.0–100.0)
Platelets: 287 10*3/uL (ref 150–400)
RBC: 4.31 MIL/uL (ref 3.87–5.11)
RDW: 14.3 % (ref 11.5–15.5)
WBC: 12.2 10*3/uL — ABNORMAL HIGH (ref 4.0–10.5)
nRBC: 0 % (ref 0.0–0.2)

## 2023-05-20 LAB — BASIC METABOLIC PANEL
Anion gap: 12 (ref 5–15)
BUN: 15 mg/dL (ref 8–23)
CO2: 18 mmol/L — ABNORMAL LOW (ref 22–32)
Calcium: 8.6 mg/dL — ABNORMAL LOW (ref 8.9–10.3)
Chloride: 103 mmol/L (ref 98–111)
Creatinine, Ser: 1.02 mg/dL — ABNORMAL HIGH (ref 0.44–1.00)
GFR, Estimated: >60 mL/min (ref >60)
Glucose, Bld: 92 mg/dL (ref 70–99)
Potassium: 3.5 mmol/L (ref 3.5–5.1)
Sodium: 133 mmol/L — ABNORMAL LOW (ref 135–145)

## 2023-05-20 LAB — TYPE AND SCREEN
ABO/RH(D): O POS
Antibody Screen: NEGATIVE

## 2023-05-20 LAB — ABO/RH: ABO/RH(D): O POS

## 2023-05-20 MED ORDER — HYDROMORPHONE HCL 1 MG/ML IJ SOLN
0.5000 mg | INTRAMUSCULAR | Status: DC | PRN
  Administered 2023-05-20 – 2023-05-21 (×4): 0.5 mg via INTRAVENOUS
  Filled 2023-05-20 (×3): qty 1
  Filled 2023-05-20: qty 0.5

## 2023-05-20 MED ORDER — MORPHINE SULFATE (PF) 4 MG/ML IV SOLN
4.0000 mg | Freq: Once | INTRAVENOUS | Status: AC
  Administered 2023-05-20: 4 mg via INTRAVENOUS
  Filled 2023-05-20: qty 1

## 2023-05-20 MED ORDER — OXYCODONE HCL 5 MG PO TABS
5.0000 mg | ORAL_TABLET | Freq: Four times a day (QID) | ORAL | Status: DC | PRN
  Administered 2023-05-21: 5 mg via ORAL
  Filled 2023-05-20: qty 1

## 2023-05-20 MED ORDER — HYDROMORPHONE HCL 1 MG/ML IJ SOLN
0.5000 mg | Freq: Once | INTRAMUSCULAR | Status: AC
  Administered 2023-05-20: 0.5 mg via INTRAVENOUS
  Filled 2023-05-20: qty 1

## 2023-05-20 MED ORDER — SENNOSIDES-DOCUSATE SODIUM 8.6-50 MG PO TABS: 1.0000 | ORAL_TABLET | Freq: Every evening | ORAL | Status: DC | PRN

## 2023-05-20 MED ORDER — MELATONIN 5 MG PO TABS: 5.0000 mg | ORAL_TABLET | Freq: Every evening | ORAL | Status: DC | PRN

## 2023-05-20 MED ORDER — ONDANSETRON HCL 4 MG/2ML IJ SOLN
4.0000 mg | Freq: Once | INTRAMUSCULAR | Status: AC
  Administered 2023-05-20: 4 mg via INTRAVENOUS
  Filled 2023-05-20: qty 2

## 2023-05-20 MED ORDER — PROCHLORPERAZINE EDISYLATE 10 MG/2ML IJ SOLN
5.0000 mg | Freq: Four times a day (QID) | INTRAMUSCULAR | Status: DC | PRN
  Administered 2023-05-21: 5 mg via INTRAVENOUS
  Filled 2023-05-20: qty 2

## 2023-05-20 MED ORDER — HEPARIN SODIUM (PORCINE) 5000 UNIT/ML IJ SOLN
5000.0000 [IU] | Freq: Three times a day (TID) | INTRAMUSCULAR | Status: DC
Start: 2023-05-21 — End: 2023-05-21
  Administered 2023-05-21: 5000 [IU] via SUBCUTANEOUS
  Filled 2023-05-20: qty 1

## 2023-05-20 MED ORDER — ACETAMINOPHEN 325 MG PO TABS
650.0000 mg | ORAL_TABLET | Freq: Four times a day (QID) | ORAL | Status: DC | PRN
  Administered 2023-05-22 – 2023-05-24 (×2): 650 mg via ORAL
  Filled 2023-05-20 (×2): qty 2

## 2023-05-20 NOTE — H&P (Addendum)
 History and Physical  [name] FMW:[id number] DOB: [birth year] DOA: 05/20/2023  Referring physician: Dr. Bernard, EDP  PCP: Leonce Carola [name]  Outpatient Specialists: Oncology, GI, orthopedic surgery. Patient coming from: Home  Chief Complaint: Fall   HPI: [name] is a 67 y.o. female with medical history significant for breast cancer on letrozole , rheumatoid arthritis, severe osteoarthritis, history of lipoid pneumonia, hyperlipidemia, benign mass of parotid gland, GERD, hepatitis C 2004, recent fall leading to left hip fracture status post pins placement, who presents to the ER via EMS after a mechanical fall at home.  She was cleaning after her dog and fell on her left hip, reinjuring it.  EMS was activated.  No loss of consciousness.  Not on blood thinners.  The patient received IV opioid-based analgesics en route via EMS and was brought into the ED for further evaluation.  In the ER, left hip x-ray revealed acute proximal left femoral metaphysis fracture involving the inferior portions of the trochanters with mild overriding and 1 cm lateral displacement of the distal fracture fragment.  3 intact fixation pins in the left femoral neck.  EDP discussed the case with orthopedic surgeon on-call Dr. Thena, will see in consultation.  N.p.o. after midnight for possible orthopedic surgery procedure in the morning.  Admitted by [facility], hospitalist service.  The patient developed acute urinary retention with >900 cc urine retained, Foley catheter was inserted.  ED Course: Temperature 98.2.  BP 130/62, pulse 52, respiratory rate 15, O2 saturation 100% on room air.  Lab studies notable for serum sodium 133, serum bicarb 18, creatinine 1.02 with GFR greater than 60.  WBC 12.2, hemoglobin 11.5.  Platelet count 287.  Review of Systems: Review of systems as noted in the HPI. All other systems reviewed and are negative.   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), hypoglycemia 2/04-resolved with PO calcium  + D, SVT, was treated with iterferon and ribavirin-10/3-2/04   Personal history of chemotherapy    PUD (peptic ulcer disease)    Rheumatoid arthritis(714.0)    possible   SVT (supraventricular tachycardia) (HCC)    Wears glasses    Past Surgical History:  Procedure Laterality Date   ABDOMINAL HYSTERECTOMY     BREAST SURGERY N/A    Phreesia 03/21/2020   CESAREAN SECTION  09/17/1980   CESAREAN SECTION N/A    Phreesia 03/21/2020   COLONOSCOPY     DILATION AND CURETTAGE OF UTERUS     hysterectomy-for dysmenorrhea  09/17/2000   JOINT REPLACEMENT N/A    Phreesia 03/21/2020   MASTECTOMY Right 2016   MASTECTOMY W/ SENTINEL NODE BIOPSY Right 07/28/2014   Procedure: RIGHT MASTECTOMY WITH SENTINEL LYMPH NODE BIOPSY ;  Surgeon: Jina Nephew,  MD;  Location: [facility];  Service: General;  Laterality: Right;   NASAL SINUS SURGERY  03/20/1984   for deviated septum   PORTACATH PLACEMENT Left 01/20/2014   Procedure: INSERTION PORT-A-CATH;  Surgeon: Jina Nephew, MD;  Location: [facility];  Service: General;  Laterality: Left;   TONSILLECTOMY  05/20/1961   UPPER GI ENDOSCOPY      Social History:  reports that she has been smoking. She has never used smokeless tobacco. She reports that she  does not currently use alcohol. She reports that she does not use drugs.   Allergies  Allergen Reactions   Pegfilgrastim  Hives    Blistering rash all over body   Codeine Itching and Nausea Only    Pt has had codeine with pre-administration to prevent reaction without any problem.  Other Reaction(s): nausea and vomiting, itching    Family History  Problem Relation Age of Onset   Dementia Mother    Hypertension Mother    Hyperlipidemia Mother    Cancer Father 59       lung   Dementia Father    Breast cancer Maternal Aunt    Cancer Paternal Uncle    Breast cancer Cousin    Colon cancer Neg Hx       Prior to Admission medications   Medication Sig Start Date End Date Taking? Authorizing Provider  ALPRAZolam  (XANAX ) 0.5 MG tablet Take 0.5 mg by mouth 2 (two) times daily as needed for anxiety. 11/28/22  Yes [provider]  atenolol  (TENORMIN ) 50 MG tablet TAKE 1 TABLET(50 MG) BY MOUTH TWICE DAILY Patient taking differently: Take 50 mg by mouth daily. TAKE 2 TABLET(50 MG) BY MOUTH TWICE DAILY 09/11/22  Yes Crenshaw, Redell RAMAN, MD  B Complex-C (B-COMPLEX WITH VITAMIN C) tablet Take 1 tablet by mouth 2 (two) times a week.   Yes [provider]  famotidine  (PEPCID ) 40 MG tablet Take 40 mg by mouth at bedtime. 01/22/23  Yes [provider]  fluticasone  (FLONASE ) 50 MCG/ACT nasal spray Place 2 sprays into both nostrils daily. 01/30/23  Yes Soldatova, Liuba, MD  gabapentin  (NEURONTIN ) 600 MG tablet Take 600 mg by mouth 3 (three) times daily. 03/20/21  Yes [provider]  Misc Natural Products (TUMERSAID) TABS as directed Orally   Yes [provider]  Multiple Vitamins tablet Take by mouth.   Yes [provider]  Multiple Vitamins-Minerals (WOMENS MULTIVITAMIN PO) Take 1 tablet by mouth 3 (three) times a week.   Yes [provider]  oxyCODONE -acetaminophen  (PERCOCET/ROXICET) 5-325 MG tablet Take 1-2 tablets by mouth every 8 (eight)  hours as needed for severe pain (pain score 7-10). 05/12/23  Yes Gudena, Vinay, MD  pantoprazole  (PROTONIX ) 40 MG tablet Take 40 mg by mouth 2 (two) times daily.   Yes [provider]  predniSONE  (DELTASONE ) 10 MG tablet TAKE 1 TABLET(10 MG) BY MOUTH DAILY WITH BREAKFAST 04/21/23  Yes Gudena, Vinay, MD  sertraline  (ZOLOFT ) 50 MG tablet Take 25 mg by mouth at bedtime. 03/11/23  Yes [provider]  Turmeric (QC TUMERIC COMPLEX PO) Take 1 tablet by mouth daily.   Yes [provider]  ibuprofen  (ADVIL ,MOTRIN ) 600 MG tablet Take 1 tablet (600 mg total) by mouth every 6 (six) hours as needed. Patient not taking: Reported on 03/20/2023 02/03/17   Olympia Gee, PA-C  letrozole  (FEMARA ) 2.5 MG tablet Take 1 tablet (2.5 mg total) by mouth daily. Patient not taking: Reported on 03/20/2023 11/07/22   Gudena,  Mackey, MD  ondansetron  (ZOFRAN ) 8 MG tablet Take 1 tablet (8 mg total) by mouth every 8 (eight) hours as needed for nausea or vomiting. Patient not taking: Reported on 03/20/2023 12/12/22   Gudena, Vinay, MD  Tiotropium Bromide-Olodaterol (STIOLTO RESPIMAT ) 2.5-2.5 MCG/ACT AERS Inhale 2 puffs into the lungs daily. Patient not taking: Reported on 03/20/2023 03/04/23   Parrett, Madelin RAMAN, NP    Physical Exam: BP 130/62   Pulse (!) 52   Temp 98.2 F (36.8 C) (Oral)   Resp 12   SpO2 100%   General: 67 y.o. year-old female well developed well nourished in no acute distress.  Alert and oriented x3. Cardiovascular: Regular rate and rhythm with no rubs or gallops.  No thyromegaly or JVD noted.  No lower extremity edema. 2/4 pulses in all 4 extremities. Respiratory: Clear to auscultation with no wheezes or rales. Good inspiratory effort. Abdomen: Soft nontender nondistended with normal bowel sounds x4 quadrants. Muskuloskeletal: No cyanosis, clubbing or edema noted bilaterally Neuro: CN II-XII intact, strength, sensation, reflexes Skin: No ulcerative lesions noted or  rashes Psychiatry: Judgement and insight appear normal. Mood is appropriate for condition and setting          Labs on Admission:  Basic Metabolic Panel: Recent Labs  Lab 05/20/23 2101  NA 133*  K 3.5  CL 103  CO2 18*  GLUCOSE 92  BUN 15  CREATININE 1.02*  CALCIUM  8.6*   Liver Function Tests: No results for input(s): AST, ALT, ALKPHOS, BILITOT, PROT, ALBUMIN in the last 168 hours. No results for input(s): LIPASE, AMYLASE in the last 168 hours. No results for input(s): AMMONIA in the last 168 hours. CBC: Recent Labs  Lab 05/20/23 2101  WBC 12.2*  HGB 11.5*  HCT 35.1*  MCV 81.4  PLT 287   Cardiac Enzymes: No results for input(s): CKTOTAL, CKMB, CKMBINDEX, TROPONINI in the last 168 hours.  BNP (last 3 results) No results for input(s): BNP in the last 8760 hours.  ProBNP (last 3 results) No results for input(s): PROBNP in the last 8760 hours.  CBG: No results for input(s): GLUCAP in the last 168 hours.  Radiological Exams on Admission: DG Chest 1 View Result Date: 05/20/2023 CLINICAL DATA:  Fall with left hip fracture, breast cancer EXAM: CHEST  1 VIEW COMPARISON:  11/29/2022 chest radiograph. FINDINGS: Left subclavian Port-A-Cath terminates in the middle third of the SVC. Stable cardiomediastinal silhouette with top-normal heart size. No pneumothorax. No pleural effusion. Lungs appear clear, with no acute consolidative airspace disease and no pulmonary edema. Surgical clips overlie the right axilla. IMPRESSION: No active cardiopulmonary disease. Electronically Signed   By: Selinda DELENA Blue M.D.   On: 05/20/2023 22:20   DG HIP UNILAT W OR W/O PELVIS 2-3 VIEWS LEFT Result Date: 05/20/2023 CLINICAL DATA:  Fall onto left hip today, recent left hip ORIF 03/25/2023 EXAM: DG HIP (WITH OR WITHOUT PELVIS) 2-3V LEFT COMPARISON:  None Available. FINDINGS: Acute proximal left femoral metaphysis fracture involving the inferior portions of the  trochanters with mild over riding and 1 cm lateral displacement of the distal fracture fragment. Three intact fixation pins in the left femoral neck. No hip dislocation. Right total hip arthroplasty without hardware complication. No pelvic diastasis. No focal osseous lesions. Mild lower lumbar degenerative changes. IMPRESSION: Acute proximal left femoral metaphysis fracture involving the inferior portions of the trochanters with mild over-riding and 1 cm lateral displacement of the distal fracture fragment. Three intact fixation pins in the left femoral neck. Right total  hip arthroplasty without complication. Electronically Signed   By: Selinda DELENA Blue M.D.   On: 05/20/2023 22:19    EKG: I independently viewed the EKG done and my findings are as followed: Sinus pericardia rate of 54.  Nonspecific ST-T changes with QTc 426.  Assessment/Plan Present on Admission: **None**  Principal Problem:   S/p left hip fracture  Left hip fracture status post mechanical fall, POA As needed analgesics Orthopedic surgery consulted N.p.o. after midnight due to possible orthopedic surgical procedure Type and screen Open CBC in the morning  Leukocytosis, suspect reactive in the setting of steroid use On steroid use for rheumatoid arthritis, follows with rheumatology Monitor WBC Obtain CBC in the morning  History of breast cancer on letrozole  Hold off letrozole  today due to possible surgery in the morning  Chronic anxiety/depression Resume home Zoloft  and as needed Xanax   GERD Resume home Pepcid  and Protonix   Rheumatoid arthritis Resume home regimen  Peripheral neuropathy Resume home gabapentin   Acute urinary retention with greater than 900 cc urine retained, POA Foley catheter inserted on 05/21/2023. Continue to monitor urine output   Time: 75 minutes.   DVT prophylaxis: Subcu heparin  3 times daily-unclear of timing of procedure.  Hold chemical DVT prophylaxis when surgery is scheduled.  Code  Status: Full code.  Family Communication: None at bedside.  Disposition Plan: Admitted to telemetry unit  Consults called: Orthopedic surgery consulted by EDP.  Admission status: Inpatient status.   Status is: Inpatient The patient requires at least 2 midnights for further evaluation and treatment of present condition.   Terry LOISE Hurst MD [facility] Pager [phone]  If 7PM-7AM, please contact night-coverage [email or web address] Password [email or web address]  05/20/2023, 11:58 PM

## 2023-05-20 NOTE — ED Triage Notes (Signed)
PT BIB EMS for fall at home, pt fell on L hip. Shortening and roation noted by EMS. N LOC and no thinners. 100 mcg fentanyl given en route. Pt rates pain 10/10.

## 2023-05-20 NOTE — ED Provider Notes (Signed)
 [facility] Provider Note   CSN: [id number] Arrival date & time: 05/20/23  1900     History  Chief Complaint  Patient presents with   [name] is a 67 y.o. female.  Pt s/p fall at home today with pain to left hip post fall. Notes prior left hip fx a couple months ago. Dull non radiating pain to hip, mod-severe, worse w movement. No leg numbness/weakness. Denies faintness or dizziness prior to fall, indicates was mechanical fall, cleaning up after dog. No head injury or loc. No headache. No anticoagulant use. No neck/back pain. No other extremity pain or injury. Skin intact.   The history is provided by the patient and medical records.  Fall Pertinent negatives include no chest pain, no abdominal pain, no headaches and no shortness of breath.       Home Medications Prior to Admission medications   Medication Sig Start Date End Date Taking? Authorizing Provider  ALPRAZolam  (XANAX ) 0.5 MG tablet Take 0.5 mg by mouth 2 (two) times daily. 11/28/22   [provider]  atenolol  (TENORMIN ) 50 MG tablet TAKE 1 TABLET(50 MG) BY MOUTH TWICE DAILY Patient taking differently: Take 50 mg by mouth daily. TAKE 2 TABLET(50 MG) BY MOUTH TWICE DAILY 09/11/22   Pietro Redell RAMAN, MD  B Complex-C (B-COMPLEX WITH VITAMIN C) tablet Take 1 tablet by mouth 2 (two) times a week.    [provider]  famotidine  (PEPCID ) 40 MG tablet Take 40 mg by mouth at bedtime. 01/22/23   [provider]  fluticasone  (FLONASE ) 50 MCG/ACT nasal spray Place 2 sprays into both nostrils daily. 01/30/23   Soldatova, Liuba, MD  gabapentin  (NEURONTIN ) 600 MG tablet Take 600 mg by mouth 3 (three) times daily. 03/20/21   [provider]  ibuprofen  (ADVIL ,MOTRIN ) 600 MG tablet Take 1 tablet (600 mg total) by mouth every 6 (six) hours as needed. Patient not taking: Reported on 03/20/2023 02/03/17   Olympia Gee, PA-C  letrozole  (FEMARA ) 2.5 MG  tablet Take 1 tablet (2.5 mg total) by mouth daily. Patient not taking: Reported on 03/20/2023 11/07/22   Gudena, Vinay, MD  Misc Natural Products [facility]) TABS as directed Orally    [provider]  Multiple Vitamins tablet Take by mouth.    [provider]  Multiple Vitamins-Minerals (WOMENS MULTIVITAMIN PO) Take 1 tablet by mouth 3 (three) times a week.    [provider]  ondansetron  (ZOFRAN ) 8 MG tablet Take 1 tablet (8 mg total) by mouth every 8 (eight) hours as needed for nausea or vomiting. Patient not taking: Reported on 03/20/2023 12/12/22   Gudena, Vinay, MD  oxyCODONE -acetaminophen  (PERCOCET/ROXICET) 5-325 MG tablet Take 1-2 tablets by mouth every 8 (eight) hours as needed for severe pain (pain score 7-10). 05/12/23   Gudena, Vinay, MD  pantoprazole  (PROTONIX ) 40 MG tablet Take 40 mg by mouth 2 (two) times daily.    [provider]  predniSONE  (DELTASONE ) 10 MG tablet TAKE 1 TABLET(10 MG) BY MOUTH DAILY WITH BREAKFAST 04/21/23   Odean Potts, MD  sertraline  (ZOLOFT ) 25 MG tablet Take by mouth. 03/11/23   [provider]  Tiotropium Bromide-Olodaterol (STIOLTO RESPIMAT ) 2.5-2.5 MCG/ACT AERS Inhale 2 puffs into the lungs daily. Patient not taking: Reported on 03/20/2023 03/04/23   Parrett, Madelin RAMAN, NP  Turmeric (QC TUMERIC COMPLEX PO) Take 1 tablet by mouth daily.    [provider]      Allergies  Codeine    Review of Systems   Review of Systems  Constitutional:  Negative for fever.  Respiratory:  Negative for shortness of breath.   Cardiovascular:  Negative for chest pain.  Gastrointestinal:  Negative for abdominal pain, nausea and vomiting.  Genitourinary:  Negative for flank pain.  Musculoskeletal:  Negative for back pain and neck pain.  Skin:  Negative for wound.  Neurological:  Negative for weakness, numbness and headaches.    Physical Exam Updated Vital Signs BP 130/62   Pulse (!) 52   Temp 98.7 F (37.1 C)  (Oral)   Resp 12   SpO2 100%  Physical Exam Vitals and nursing note reviewed.  Constitutional:      Appearance: Normal appearance. She is well-developed.  HENT:     Head: Atraumatic.     Nose: Nose normal.     Mouth/Throat:     Mouth: Mucous membranes are moist.  Eyes:     General: No scleral icterus.    Conjunctiva/sclera: Conjunctivae normal.     Pupils: Pupils are equal, round, and reactive to light.  Neck:     Trachea: No tracheal deviation.  Cardiovascular:     Rate and Rhythm: Normal rate and regular rhythm.     Pulses: Normal pulses.     Heart sounds: Normal heart sounds. No murmur heard.    No friction rub. No gallop.  Pulmonary:     Effort: Pulmonary effort is normal. No respiratory distress.     Breath sounds: Normal breath sounds.  Chest:     Chest wall: No tenderness.  Abdominal:     General: There is no distension.     Palpations: Abdomen is soft.     Tenderness: There is no abdominal tenderness.  Musculoskeletal:        General: No swelling.     Cervical back: Normal range of motion and neck supple. No rigidity or tenderness. No muscular tenderness.     Comments: CTLS spine, non tender, aligned, no step off. Shortening and external rotation of left leg. Pain/tenderness left hip. No other focal extremity pain or tenderness. Distal pulses palp.   Skin:    General: Skin is warm and dry.     Findings: No rash.  Neurological:     Mental Status: She is alert.     Comments: Alert, speech normal. LLE nvi w intact motor/sens fxn.   Psychiatric:        Mood and Affect: Mood normal.     ED Results / Procedures / Treatments   Labs (all labs ordered are listed, but only abnormal results are displayed) Results for orders placed or performed during the hospital encounter of 05/20/23  CBC   Collection Time: 05/20/23  9:01 PM  Result Value Ref Range   WBC 12.2 (H) 4.0 - 10.5 K/uL   RBC 4.31 3.87 - 5.11 MIL/uL   Hemoglobin 11.5 (L) 12.0 - 15.0 g/dL   HCT 64.8 (L)  63.9 - 46.0 %   MCV 81.4 80.0 - 100.0 fL   MCH 26.7 26.0 - 34.0 pg   MCHC 32.8 30.0 - 36.0 g/dL   RDW 85.6 88.4 - 84.4 %   Platelets 287 150 - 400 K/uL   nRBC 0.0 0.0 - 0.2 %  Basic metabolic panel   Collection Time: 05/20/23  9:01 PM  Result Value Ref Range   Sodium 133 (L) 135 - 145 mmol/L   Potassium 3.5 3.5 - 5.1 mmol/L   Chloride 103 98 - 111  mmol/L   CO2 18 (L) 22 - 32 mmol/L   Glucose, Bld 92 70 - 99 mg/dL   BUN 15 8 - 23 mg/dL   Creatinine, Ser 8.97 (H) 0.44 - 1.00 mg/dL   Calcium  8.6 (L) 8.9 - 10.3 mg/dL   GFR, Estimated >39 >39 mL/min   Anion gap 12 5 - 15     EKG None  Radiology No results found.  Procedures Procedures    Medications Ordered in ED Medications  morphine  (PF) 4 MG/ML injection 4 mg (has no administration in time range)  ondansetron  (ZOFRAN ) injection 4 mg (has no administration in time range)  HYDROmorphone  (DILAUDID ) injection 0.5 mg (0.5 mg Intravenous Given 05/20/23 1938)    ED Course/ Medical Decision Making/ A&P                                 Medical Decision Making Problems Addressed: Closed left hip fracture, initial encounter [facility]): acute illness or injury with systemic symptoms that poses a threat to life or bodily functions Fall from slip, trip, or stumble, initial encounter: acute illness or injury with systemic symptoms that poses a threat to life or bodily functions  Amount and/or Complexity of Data Reviewed Independent Historian: EMS    Details: hx External Data Reviewed: notes. Labs: ordered. Decision-making details documented in ED Course. Radiology: ordered and independent interpretation performed. Decision-making details documented in ED Course. ECG/medicine tests: ordered and independent interpretation performed. Decision-making details documented in ED Course. Discussion of management or test interpretation with external provider(s): Ortho, medicine  Risk Prescription drug management. Parenteral controlled  substances. Decision regarding hospitalization.   Iv ns. Continuous pulse ox and cardiac monitoring. Labs ordered/sent. Imaging ordered.   Differential diagnosis includes hip fx, hip dislocation, etc. Dispo decision including potential need for admission considered - will get labs and imaging and reassess.   Reviewed nursing notes and prior charts for additional history. External reports reviewed. Additional history from: EMS.   Cardiac monitor: sinus rhythm, rate 60.  Dilaudid  iv.   Labs reviewed/interpreted by me - Na sl low. Hct 35.   Xrays reviewed/interpreted by me - left hip fx.   Ortho consulted, discussed pt, and prior left hip hx, with ortho on call, Dr Genelle - he will see in consult. Requests medicine admit.   Hospitalists consulted for admission.  Morphine  iv. Zofran  iv.   Pain controlled. No distress.            Final Clinical Impression(s) / ED Diagnoses Final diagnoses:  None    Rx / DC Orders ED Discharge Orders     None         Bernard Drivers, MD 05/20/23 2206

## 2023-05-21 ENCOUNTER — Other Ambulatory Visit: Payer: Self-pay

## 2023-05-21 ENCOUNTER — Inpatient Hospital Stay (HOSPITAL_COMMUNITY): Payer: Medicare HMO | Admitting: Anesthesiology

## 2023-05-21 ENCOUNTER — Inpatient Hospital Stay (HOSPITAL_COMMUNITY): Payer: Medicare HMO

## 2023-05-21 ENCOUNTER — Encounter (HOSPITAL_COMMUNITY)
Admission: EM | Disposition: A | Discharge status: Skilled Nursing Facility | Payer: Self-pay | Source: Home / Self Care | Attending: Student

## 2023-05-21 DIAGNOSIS — J449 Chronic obstructive pulmonary disease, unspecified: Secondary | ICD-10-CM | POA: Diagnosis not present

## 2023-05-21 DIAGNOSIS — F17210 Nicotine dependence, cigarettes, uncomplicated: Secondary | ICD-10-CM

## 2023-05-21 DIAGNOSIS — S7222XA Displaced subtrochanteric fracture of left femur, initial encounter for closed fracture: Secondary | ICD-10-CM

## 2023-05-21 DIAGNOSIS — M9702XA Periprosthetic fracture around internal prosthetic left hip joint, initial encounter: Secondary | ICD-10-CM

## 2023-05-21 DIAGNOSIS — Z8781 Personal history of (healed) traumatic fracture: Secondary | ICD-10-CM | POA: Diagnosis not present

## 2023-05-21 DIAGNOSIS — W19XXXA Unspecified fall, initial encounter: Secondary | ICD-10-CM | POA: Insufficient documentation

## 2023-05-21 HISTORY — PX: INTRAMEDULLARY (IM) NAIL INTERTROCHANTERIC: SHX5875

## 2023-05-21 LAB — CBC
HCT: 37.5 % (ref 36.0–46.0)
Hemoglobin: 11.8 g/dL — ABNORMAL LOW (ref 12.0–15.0)
MCH: 26 pg (ref 26.0–34.0)
MCHC: 31.5 g/dL (ref 30.0–36.0)
MCV: 82.6 fL (ref 80.0–100.0)
Platelets: 269 10*3/uL (ref 150–400)
RBC: 4.54 MIL/uL (ref 3.87–5.11)
RDW: 14.3 % (ref 11.5–15.5)
WBC: 12 10*3/uL — ABNORMAL HIGH (ref 4.0–10.5)
nRBC: 0 % (ref 0.0–0.2)

## 2023-05-21 LAB — MAGNESIUM: Magnesium: 1.8 mg/dL (ref 1.7–2.4)

## 2023-05-21 LAB — PHOSPHORUS: Phosphorus: 4 mg/dL (ref 2.5–4.6)

## 2023-05-21 LAB — BASIC METABOLIC PANEL
Anion gap: 9 (ref 5–15)
BUN: 14 mg/dL (ref 8–23)
CO2: 22 mmol/L (ref 22–32)
Calcium: 8.9 mg/dL (ref 8.9–10.3)
Chloride: 104 mmol/L (ref 98–111)
Creatinine, Ser: 0.98 mg/dL (ref 0.44–1.00)
GFR, Estimated: >60 mL/min (ref >60)
Glucose, Bld: 117 mg/dL — ABNORMAL HIGH (ref 70–99)
Potassium: 3.4 mmol/L — ABNORMAL LOW (ref 3.5–5.1)
Sodium: 135 mmol/L (ref 135–145)

## 2023-05-21 LAB — SURGICAL PCR SCREEN
MRSA, PCR: NEGATIVE
Staphylococcus aureus: NEGATIVE

## 2023-05-21 LAB — HIV ANTIBODY (ROUTINE TESTING W REFLEX): HIV Screen 4th Generation wRfx: NONREACTIVE

## 2023-05-21 SURGERY — FIXATION, FRACTURE, INTERTROCHANTERIC, WITH INTRAMEDULLARY ROD
Anesthesia: General | Laterality: Left

## 2023-05-21 MED ORDER — DEXAMETHASONE SODIUM PHOSPHATE 10 MG/ML IJ SOLN
INTRAMUSCULAR | Status: AC
  Filled 2023-05-21: qty 1

## 2023-05-21 MED ORDER — STERILE WATER FOR IRRIGATION IR SOLN
Status: DC | PRN
  Administered 2023-05-21: 1000 mL

## 2023-05-21 MED ORDER — DEXAMETHASONE SODIUM PHOSPHATE 10 MG/ML IJ SOLN
INTRAMUSCULAR | Status: DC | PRN
  Administered 2023-05-21: 8 mg via INTRAVENOUS

## 2023-05-21 MED ORDER — PROPOFOL 10 MG/ML IV BOLUS
INTRAVENOUS | Status: DC | PRN
  Administered 2023-05-21: 110 mg via INTRAVENOUS

## 2023-05-21 MED ORDER — ARFORMOTEROL TARTRATE 15 MCG/2ML IN NEBU: 15.0000 ug | INHALATION_SOLUTION | Freq: Two times a day (BID) | RESPIRATORY_TRACT | Status: DC

## 2023-05-21 MED ORDER — CEFAZOLIN SODIUM-DEXTROSE 2-4 GM/100ML-% IV SOLN
INTRAVENOUS | Status: AC
  Filled 2023-05-21: qty 100

## 2023-05-21 MED ORDER — ACETAMINOPHEN 10 MG/ML IV SOLN
INTRAVENOUS | Status: DC | PRN
  Administered 2023-05-21: 1000 mg via INTRAVENOUS

## 2023-05-21 MED ORDER — ACETAMINOPHEN 10 MG/ML IV SOLN
INTRAVENOUS | Status: AC
  Filled 2023-05-21: qty 100

## 2023-05-21 MED ORDER — KETOROLAC TROMETHAMINE 30 MG/ML IJ SOLN
INTRAMUSCULAR | Status: DC | PRN
  Administered 2023-05-21: 30 mg via INTRAVENOUS

## 2023-05-21 MED ORDER — ROCURONIUM BROMIDE 10 MG/ML (PF) SYRINGE
PREFILLED_SYRINGE | INTRAVENOUS | Status: DC | PRN
  Administered 2023-05-21: 50 mg via INTRAVENOUS
  Administered 2023-05-21: 20 mg via INTRAVENOUS

## 2023-05-21 MED ORDER — POTASSIUM CHLORIDE 10 MEQ/100ML IV SOLN
10.0000 meq | INTRAVENOUS | Status: AC
  Administered 2023-05-21 (×2): 10 meq via INTRAVENOUS
  Filled 2023-05-21 (×2): qty 100

## 2023-05-21 MED ORDER — CHLORHEXIDINE GLUCONATE 4 % EX SOLN: 60.0000 mL | Freq: Once | CUTANEOUS | Status: DC

## 2023-05-21 MED ORDER — PROPOFOL 10 MG/ML IV BOLUS
INTRAVENOUS | Status: AC
  Filled 2023-05-21: qty 20

## 2023-05-21 MED ORDER — ALPRAZOLAM 0.25 MG PO TABS
0.2500 mg | ORAL_TABLET | Freq: Two times a day (BID) | ORAL | Status: DC | PRN
  Administered 2023-05-22: 0.25 mg via ORAL
  Filled 2023-05-21: qty 1

## 2023-05-21 MED ORDER — MIDAZOLAM HCL 2 MG/2ML IJ SOLN
INTRAMUSCULAR | Status: AC
  Filled 2023-05-21: qty 2

## 2023-05-21 MED ORDER — PREDNISONE 5 MG PO TABS
10.0000 mg | ORAL_TABLET | Freq: Every day | ORAL | Status: DC
  Administered 2023-05-21 – 2023-05-24 (×4): 10 mg via ORAL
  Filled 2023-05-21 (×4): qty 2

## 2023-05-21 MED ORDER — ONDANSETRON HCL 4 MG/2ML IJ SOLN: 4.0000 mg | Freq: Once | INTRAMUSCULAR | Status: DC | PRN

## 2023-05-21 MED ORDER — FAMOTIDINE 20 MG PO TABS
40.0000 mg | ORAL_TABLET | Freq: Every day | ORAL | Status: DC
  Administered 2023-05-21 – 2023-05-23 (×3): 40 mg via ORAL
  Filled 2023-05-21 (×3): qty 2

## 2023-05-21 MED ORDER — FENTANYL CITRATE (PF) 100 MCG/2ML IJ SOLN
INTRAMUSCULAR | Status: DC | PRN
  Administered 2023-05-21 (×2): 25 ug via INTRAVENOUS
  Administered 2023-05-21: 50 ug via INTRAVENOUS

## 2023-05-21 MED ORDER — OXYCODONE HCL 5 MG PO TABS
5.0000 mg | ORAL_TABLET | ORAL | Status: DC | PRN
  Administered 2023-05-22 (×3): 5 mg via ORAL
  Filled 2023-05-21 (×3): qty 1

## 2023-05-21 MED ORDER — 0.9 % SODIUM CHLORIDE (POUR BTL) OPTIME
TOPICAL | Status: DC | PRN
  Administered 2023-05-21: 1000 mL

## 2023-05-21 MED ORDER — EPHEDRINE 5 MG/ML INJ
INTRAVENOUS | Status: AC
  Filled 2023-05-21: qty 5

## 2023-05-21 MED ORDER — SERTRALINE HCL 25 MG PO TABS
25.0000 mg | ORAL_TABLET | Freq: Every day | ORAL | Status: DC
  Administered 2023-05-22 – 2023-05-23 (×2): 25 mg via ORAL
  Filled 2023-05-21 (×2): qty 1

## 2023-05-21 MED ORDER — ROCURONIUM BROMIDE 10 MG/ML (PF) SYRINGE
PREFILLED_SYRINGE | INTRAVENOUS | Status: AC
  Filled 2023-05-21: qty 10

## 2023-05-21 MED ORDER — LIDOCAINE HCL (PF) 2 % IJ SOLN
INTRAMUSCULAR | Status: AC
  Filled 2023-05-21: qty 5

## 2023-05-21 MED ORDER — TRANEXAMIC ACID-NACL 1000-0.7 MG/100ML-% IV SOLN
1000.0000 mg | INTRAVENOUS | Status: AC
  Administered 2023-05-21: 1000 mg via INTRAVENOUS

## 2023-05-21 MED ORDER — FENTANYL CITRATE (PF) 100 MCG/2ML IJ SOLN
INTRAMUSCULAR | Status: AC
  Filled 2023-05-21: qty 2

## 2023-05-21 MED ORDER — IPRATROPIUM-ALBUTEROL 0.5-2.5 (3) MG/3ML IN SOLN: 3.0000 mL | Freq: Four times a day (QID) | RESPIRATORY_TRACT | Status: DC | PRN

## 2023-05-21 MED ORDER — TRANEXAMIC ACID-NACL 1000-0.7 MG/100ML-% IV SOLN
INTRAVENOUS | Status: AC
  Filled 2023-05-21: qty 100

## 2023-05-21 MED ORDER — CEFAZOLIN SODIUM-DEXTROSE 2-4 GM/100ML-% IV SOLN
2.0000 g | INTRAVENOUS | Status: AC
  Administered 2023-05-21: 2 g via INTRAVENOUS

## 2023-05-21 MED ORDER — ONDANSETRON HCL 4 MG/2ML IJ SOLN
INTRAMUSCULAR | Status: AC
  Filled 2023-05-21: qty 2

## 2023-05-21 MED ORDER — PHENYLEPHRINE HCL-NACL 20-0.9 MG/250ML-% IV SOLN
INTRAVENOUS | Status: AC
  Filled 2023-05-21: qty 250

## 2023-05-21 MED ORDER — GABAPENTIN 300 MG PO CAPS
300.0000 mg | ORAL_CAPSULE | Freq: Two times a day (BID) | ORAL | Status: DC
  Administered 2023-05-22 – 2023-05-24 (×5): 300 mg via ORAL
  Filled 2023-05-21 (×5): qty 1

## 2023-05-21 MED ORDER — UMECLIDINIUM BROMIDE 62.5 MCG/ACT IN AEPB: 1.0000 | INHALATION_SPRAY | Freq: Every day | RESPIRATORY_TRACT | Status: DC

## 2023-05-21 MED ORDER — SUGAMMADEX SODIUM 200 MG/2ML IV SOLN
INTRAVENOUS | Status: DC | PRN
  Administered 2023-05-21: 200 mg via INTRAVENOUS

## 2023-05-21 MED ORDER — POTASSIUM CHLORIDE CRYS ER 20 MEQ PO TBCR
20.0000 meq | EXTENDED_RELEASE_TABLET | Freq: Once | ORAL | Status: AC
Start: 2023-05-21 — End: 2023-05-21
  Administered 2023-05-21: 20 meq via ORAL
  Filled 2023-05-21: qty 1

## 2023-05-21 MED ORDER — ATENOLOL 50 MG PO TABS
50.0000 mg | ORAL_TABLET | Freq: Every day | ORAL | Status: DC
  Administered 2023-05-21 – 2023-05-24 (×4): 50 mg via ORAL
  Filled 2023-05-21 (×4): qty 1

## 2023-05-21 MED ORDER — PHENYLEPHRINE 80 MCG/ML (10ML) SYRINGE FOR IV PUSH (FOR BLOOD PRESSURE SUPPORT)
PREFILLED_SYRINGE | INTRAVENOUS | Status: AC
  Filled 2023-05-21: qty 10

## 2023-05-21 MED ORDER — SODIUM CHLORIDE 0.9 % IV SOLN: INTRAVENOUS | Status: DC | PRN

## 2023-05-21 MED ORDER — HYDROMORPHONE HCL 1 MG/ML IJ SOLN: 1.0000 mg | INTRAMUSCULAR | Status: DC | PRN

## 2023-05-21 MED ORDER — ASPIRIN 325 MG PO TABS
325.0000 mg | ORAL_TABLET | Freq: Every day | ORAL | Status: DC
  Administered 2023-05-22 – 2023-05-24 (×3): 325 mg via ORAL
  Filled 2023-05-21 (×3): qty 1

## 2023-05-21 MED ORDER — PHENYLEPHRINE 80 MCG/ML (10ML) SYRINGE FOR IV PUSH (FOR BLOOD PRESSURE SUPPORT)
PREFILLED_SYRINGE | INTRAVENOUS | Status: DC | PRN
  Administered 2023-05-21 (×4): 160 ug via INTRAVENOUS

## 2023-05-21 MED ORDER — KETOROLAC TROMETHAMINE 30 MG/ML IJ SOLN
INTRAMUSCULAR | Status: AC
  Filled 2023-05-21: qty 1

## 2023-05-21 MED ORDER — ASPIRIN 325 MG PO TABS: 325.0000 mg | ORAL_TABLET | Freq: Every day | ORAL | Status: DC

## 2023-05-21 MED ORDER — ONDANSETRON HCL 4 MG/2ML IJ SOLN
INTRAMUSCULAR | Status: DC | PRN
  Administered 2023-05-21: 4 mg via INTRAVENOUS

## 2023-05-21 MED ORDER — HYDROMORPHONE HCL 1 MG/ML IJ SOLN
0.5000 mg | Freq: Once | INTRAMUSCULAR | Status: AC
  Administered 2023-05-21: 0.5 mg via INTRAVENOUS
  Filled 2023-05-21: qty 0.5

## 2023-05-21 MED ORDER — EPHEDRINE SULFATE-NACL 50-0.9 MG/10ML-% IV SOSY
PREFILLED_SYRINGE | INTRAVENOUS | Status: DC | PRN
  Administered 2023-05-21: 10 mg via INTRAVENOUS

## 2023-05-21 MED ORDER — FENTANYL CITRATE PF 50 MCG/ML IJ SOSY: 25.0000 ug | PREFILLED_SYRINGE | INTRAMUSCULAR | Status: DC | PRN

## 2023-05-21 MED ORDER — LIDOCAINE HCL (CARDIAC) PF 100 MG/5ML IV SOSY
PREFILLED_SYRINGE | INTRAVENOUS | Status: DC | PRN
  Administered 2023-05-21: 80 mg via INTRATRACHEAL

## 2023-05-21 MED ORDER — PANTOPRAZOLE SODIUM 40 MG PO TBEC
40.0000 mg | DELAYED_RELEASE_TABLET | Freq: Two times a day (BID) | ORAL | Status: DC
Start: 2023-05-21 — End: 2023-05-24
  Administered 2023-05-21 – 2023-05-24 (×7): 40 mg via ORAL
  Filled 2023-05-21 (×7): qty 1

## 2023-05-21 SURGICAL SUPPLY — 40 items
BAG COUNTER SPONGE SURGICOUNT (BAG) IMPLANT
BIT DRILL 4.3MMS DISTAL GRDTED (BIT) IMPLANT
CHLORAPREP W/TINT 26 (MISCELLANEOUS) ×2 IMPLANT
COVER PERINEAL POST (MISCELLANEOUS) ×2 IMPLANT
COVER SURGICAL LIGHT HANDLE (MISCELLANEOUS) ×2 IMPLANT
DRAPE C-ARM 42X120 X-RAY (DRAPES) ×2 IMPLANT
DRAPE C-ARMOR (DRAPES) ×2 IMPLANT
DRAPE STERI IOBAN 125X83 (DRAPES) ×2 IMPLANT
DRESSING MEPILEX FLEX 4X4 (GAUZE/BANDAGES/DRESSINGS) ×4 IMPLANT
DRILL 4.3MMS DISTAL GRADUATED (BIT) ×1 IMPLANT
DRSG MEPILEX FLEX 4X4 (GAUZE/BANDAGES/DRESSINGS) ×3 IMPLANT
DRSG MEPILEX POST OP 4X8 (GAUZE/BANDAGES/DRESSINGS) IMPLANT
ELECT REM PT RETURN 15FT ADLT (MISCELLANEOUS) ×2 IMPLANT
GAUZE XEROFORM 5X9 LF (GAUZE/BANDAGES/DRESSINGS) ×2 IMPLANT
GLOVE BIO SURGEON STRL SZ 6 (GLOVE) ×2 IMPLANT
GLOVE BIO SURGEON STRL SZ7.5 (GLOVE) ×2 IMPLANT
GLOVE BIOGEL PI IND STRL 6.5 (GLOVE) ×2 IMPLANT
GLOVE BIOGEL PI IND STRL 8 (GLOVE) ×2 IMPLANT
GLOVE ECLIPSE 8.0 STRL XLNG CF (GLOVE) IMPLANT
GLOVE SURG SYN 7.5 E (GLOVE) ×1 IMPLANT
GLOVE SURG SYN 7.5 PF PI (GLOVE) ×2 IMPLANT
GOWN STRL REUS W/ TWL LRG LVL3 (GOWN DISPOSABLE) ×2 IMPLANT
GUIDEPIN VERSANAIL DSP 3.2X444 (ORTHOPEDIC DISPOSABLE SUPPLIES) IMPLANT
GUIDEWIRE BALL NOSE 80CM (WIRE) IMPLANT
HIP FRA NAIL LAG SCREW 10.5X90 (Orthopedic Implant) ×1 IMPLANT
KIT BASIN OR (CUSTOM PROCEDURE TRAY) ×2 IMPLANT
KIT TURNOVER KIT A (KITS) IMPLANT
MANIFOLD NEPTUNE II (INSTRUMENTS) ×2 IMPLANT
NAIL IM AFFIXUS 9X400 125D LT (Nail) IMPLANT
NS IRRIG 1000ML POUR BTL (IV SOLUTION) ×2 IMPLANT
PACK GENERAL/GYN (CUSTOM PROCEDURE TRAY) ×2 IMPLANT
PROTECTOR NERVE ULNAR (MISCELLANEOUS) ×2 IMPLANT
SCREW BONE CORTICAL 5.0X42 (Screw) IMPLANT
SCREW LAG HIP FRA NAIL 10.5X90 (Orthopedic Implant) IMPLANT
SET IRRIG Y TYPE TUR BLADDER L (SET/KITS/TRAYS/PACK) ×2 IMPLANT
STAPLER SKIN PROX WIDE 3.9 (STAPLE) ×2 IMPLANT
SUT VIC AB 0 CT1 36 (SUTURE) ×2 IMPLANT
SUT VIC AB 1 CT1 36 (SUTURE) ×2 IMPLANT
SUT VIC AB 2-0 CT1 TAPERPNT 27 (SUTURE) ×2 IMPLANT
TOWEL OR 17X26 10 PK STRL BLUE (TOWEL DISPOSABLE) ×2 IMPLANT

## 2023-05-21 NOTE — Progress Notes (Signed)
 PROGRESS NOTE  TNIA ANGLADA FMW:[id number] DOB: 03/19/1956   PCP: Leonce Carola PARAS, PA-C  Patient is from: Home.  Lives at home.  Independently ambulates at baseline.  DOA: 05/20/2023 LOS: 1  Chief complaints Chief Complaint  Patient presents with   Fall     Brief Narrative / Interim history: 68 year old F with PMH of COPD, stage IIa breast cancer s/p mastectomy in 2016.  On letrozole , RA, severe OSA, recent fall leading to left hip fracture s/p pinning and cannulated screw on 11/5 brought to ED by EMS after she had accidental fall, and admitted with acute proximal left femoral metaphyseal fracture involving the inferior portion of the trochanters with mild overriding and lateral displacement, and acute urinary retention with > 900 cc urine output after Foley insertion.  Orthopedic surgery consulted.  Plan for surgical repair  Subjective: Seen and examined earlier this morning.  No major events overnight of this morning.  Continues to endorse severe pain.  She rates her pain 10/10.  Denies numbness or tingling.  Denies chest pain, dyspnea or GI symptoms.  Objective: Vitals:   05/21/23 0615 05/21/23 0630 05/21/23 0640 05/21/23 0810  BP: (!) 140/76 133/79  (!) 143/78  Pulse: 63 68  65  Resp: 15 15  16   Temp:   98.6 F (37 C) 97.8 F (36.6 C)  TempSrc:    Oral  SpO2: 98% 94%  97%    Examination:  GENERAL: No apparent distress.  Nontoxic. HEENT: MMM.  Vision and hearing grossly intact.  NECK: Supple.  No apparent JVD.  RESP:  No IWOB.  Fair aeration bilaterally. CVS:  RRR. Heart sounds normal.  ABD/GI/GU: BS+. Abd soft, NTND.  MSK/EXT:  Moves extremities.  Shortened and externally rotated LLE. SKIN: no apparent skin lesion or wound NEURO: Awake, alert and oriented appropriately.  No apparent focal neuro deficit. PSYCH: Calm. Normal affect.   Procedures:  None  Microbiology summarized: None  Assessment and plan: Mechanical fall at home Acute displaced proximal  left femoral metaphyseal fracture Recent left hip fracture s/p pinning at [facility] on 03/25/2023 -Plan for surgical repair by orthopedic surgery -Increase IV Dilaudid  to 1 mg every 2 hours as needed pending surgery -IV fluid -Check vitamin D   Chronic COPD: On Stiolto at home.  No respiratory issue. -Continue hospital formulary for Stiolto -Nebulizers as needed   Rheumatoid arthritis: Does not seem to be on DMARDs.  Takes prednisone  which seems to be prescribed by oncology -Continue home prednisone  -May consider stress dose steroid if signs of adrenal insufficiency.  History of breast cancer s/p mastectomy on letrozole  -Continue home letrozole  after surgery   Chronic anxiety/depression: Stable -Continue home Zoloft  and Ativan    GERD -Continue PPI   Rheumatoid arthritis Resume home regimen   Peripheral neuropathy -Continue home gabapentin    Acute urinary retention with greater than 900 cc urine retained, POA -Continue Foley catheter -Will try voiding trial prior to discharge  Hypokalemia -Monitor and replenish as appropriate  There is no height or weight on file to calculate BMI.          DVT prophylaxis:  heparin  injection 5,000 Units Start: 05/21/23 0600  Code Status: Full code Family Communication: None at bedside Level of care: Telemetry Status is: Inpatient Remains inpatient appropriate because: Left femoral fracture   Final disposition: Likely SNF Consultants:  Orthopedic surgery  55 minutes with more than 50% spent in reviewing records, counseling patient/family and coordinating care.   Sch Meds:  Scheduled Meds:  chlorhexidine   60 mL Topical Once   famotidine   40 mg Oral QHS   gabapentin   300 mg Oral BID   heparin   5,000 Units Subcutaneous Q8H   pantoprazole   40 mg Oral BID   phenylephrine        predniSONE   10 mg Oral Q breakfast   Continuous Infusions:  acetaminophen      ceFAZolin       ceFAZolin  (ANCEF ) IV     potassium chloride  10 mEq  (05/21/23 1019)   tranexamic acid      tranexamic acid      PRN Meds:.acetaminophen , acetaminophen , ALPRAZolam , ceFAZolin , HYDROmorphone  (DILAUDID ) injection, melatonin, oxyCODONE , phenylephrine , prochlorperazine , senna-docusate, tranexamic acid   Antimicrobials: Anti-infectives (From admission, onward)    Start     Dose/Rate Route Frequency Ordered Stop   05/21/23 1126  ceFAZolin  (ANCEF ) 2-4 GM/100ML-% IVPB       Note to Pharmacy: [name] D: cabinet override      05/21/23 1126 05/21/23 2329   05/21/23 1115  ceFAZolin  (ANCEF ) IVPB 2g/100 mL premix        2 g 200 mL/hr over 30 Minutes Intravenous On call to O.R. 05/21/23 1018 05/22/23 0559        I have personally reviewed the following labs and images: CBC: Recent Labs  Lab 05/20/23 2101 05/21/23 0513  WBC 12.2* 12.0*  HGB 11.5* 11.8*  HCT 35.1* 37.5  MCV 81.4 82.6  PLT 287 269   BMP &GFR Recent Labs  Lab 05/20/23 2101 05/21/23 0513  NA 133* 135  K 3.5 3.4*  CL 103 104  CO2 18* 22  GLUCOSE 92 117*  BUN 15 14  CREATININE 1.02* 0.98  CALCIUM  8.6* 8.9  MG  --  1.8  PHOS  --  4.0   CrCl cannot be calculated (Unknown ideal weight.). Liver & Pancreas: No results for input(s): AST, ALT, ALKPHOS, BILITOT, PROT, ALBUMIN in the last 168 hours. No results for input(s): LIPASE, AMYLASE in the last 168 hours. No results for input(s): AMMONIA in the last 168 hours. Diabetic: No results for input(s): HGBA1C in the last 72 hours. No results for input(s): GLUCAP in the last 168 hours. Cardiac Enzymes: No results for input(s): CKTOTAL, CKMB, CKMBINDEX, TROPONINI in the last 168 hours. No results for input(s): PROBNP in the last 8760 hours. Coagulation Profile: No results for input(s): INR, PROTIME in the last 168 hours. Thyroid  Function Tests: No results for input(s): TSH, T4TOTAL, FREET4, T3FREE, THYROIDAB in the last 72 hours. Lipid Profile: No results for  input(s): CHOL, HDL, LDLCALC, TRIG, CHOLHDL, LDLDIRECT in the last 72 hours. Anemia Panel: No results for input(s): VITAMINB12, FOLATE, FERRITIN, TIBC, IRON, RETICCTPCT in the last 72 hours. Urine analysis:    Component Value Date/Time   COLORURINE YELLOW 07/23/2008 1000   APPEARANCEUR CLEAR 07/23/2008 1000   LABSPEC 1.007 07/23/2008 1000   PHURINE 5.5 07/23/2008 1000   GLUCOSEU NEGATIVE 07/23/2008 1000   HGBUR NEGATIVE 07/23/2008 1000   BILIRUBINUR NEGATIVE 07/23/2008 1000   KETONESUR NEGATIVE 07/23/2008 1000   PROTEINUR NEGATIVE 07/23/2008 1000   UROBILINOGEN 0.2 07/23/2008 1000   NITRITE NEGATIVE 07/23/2008 1000   LEUKOCYTESUR  07/23/2008 1000    NEGATIVE MICROSCOPIC NOT DONE ON URINES WITH NEGATIVE PROTEIN, BLOOD, LEUKOCYTES, NITRITE, OR GLUCOSE <1000 mg/dL.   Sepsis Labs: Invalid input(s): PROCALCITONIN, LACTICIDVEN  Microbiology: No results found for this or any previous visit (from the past 240 hours).  Radiology Studies: DG Chest 1 View Result Date: 05/20/2023 CLINICAL DATA:  Fall with left hip fracture, breast cancer  EXAM: CHEST  1 VIEW COMPARISON:  11/29/2022 chest radiograph. FINDINGS: Left subclavian Port-A-Cath terminates in the middle third of the SVC. Stable cardiomediastinal silhouette with top-normal heart size. No pneumothorax. No pleural effusion. Lungs appear clear, with no acute consolidative airspace disease and no pulmonary edema. Surgical clips overlie the right axilla. IMPRESSION: No active cardiopulmonary disease. Electronically Signed   By: Selinda DELENA Blue M.D.   On: 05/20/2023 22:20   DG HIP UNILAT W OR W/O PELVIS 2-3 VIEWS LEFT Result Date: 05/20/2023 CLINICAL DATA:  Fall onto left hip today, recent left hip ORIF 03/25/2023 EXAM: DG HIP (WITH OR WITHOUT PELVIS) 2-3V LEFT COMPARISON:  None Available. FINDINGS: Acute proximal left femoral metaphysis fracture involving the inferior portions of the trochanters with mild over riding  and 1 cm lateral displacement of the distal fracture fragment. Three intact fixation pins in the left femoral neck. No hip dislocation. Right total hip arthroplasty without hardware complication. No pelvic diastasis. No focal osseous lesions. Mild lower lumbar degenerative changes. IMPRESSION: Acute proximal left femoral metaphysis fracture involving the inferior portions of the trochanters with mild over-riding and 1 cm lateral displacement of the distal fracture fragment. Three intact fixation pins in the left femoral neck. Right total hip arthroplasty without complication. Electronically Signed   By: Selinda DELENA Blue M.D.   On: 05/20/2023 22:19      Siah Kannan T. Costella Schwarz [facility]  If 7PM-7AM, please contact night-coverage [email or web address] 05/21/2023, 12:13 PM

## 2023-05-21 NOTE — Interval H&P Note (Signed)
 History and Physical Interval Note:  05/21/2023 12:05 PM  [name]  has presented today for surgery, with the diagnosis of LEFT HIP FRACTURE.  The various methods of treatment have been discussed with the patient and family. After consideration of risks, benefits and other options for treatment, the patient has consented to  Procedure(s): INTRAMEDULLARY (IM) NAIL INTERTROCHANTERIC WITH HARDWARE REMOVAL (Left) as a surgical intervention.  The patient's history has been reviewed, patient examined, no change in status, stable for surgery.  I have reviewed the patient's chart and labs.  Questions were answered to the patient's satisfaction.     Ciearra Rufo

## 2023-05-21 NOTE — Transfer of Care (Signed)
 Immediate Anesthesia Transfer of Care Note  Patient: [name]  Procedure(s) Performed: INTRAMEDULLARY (IM) NAIL INTERTROCHANTERIC WITH HARDWARE REMOVAL (Left)  Patient Location: PACU  Anesthesia Type:General  Level of Consciousness: patient cooperative and confused  Airway & Oxygen Therapy: Patient Spontanous Breathing and Patient connected to nasal cannula oxygen  Post-op Assessment: Report given to RN and Post -op Vital signs reviewed and stable  Post vital signs: Reviewed and stable  Last Vitals:  Vitals Value Taken Time  BP 178/73 05/21/23 1403  Temp 36.4 C 05/21/23 1403  Pulse 78 05/21/23 1408  Resp 16 05/21/23 1408  SpO2 95 % 05/21/23 1408  Vitals shown include unfiled device data.  Last Pain:  Vitals:   05/21/23 0901  TempSrc:   PainSc: 9          Complications: No notable events documented.

## 2023-05-21 NOTE — Anesthesia Preprocedure Evaluation (Addendum)
 Anesthesia Evaluation  Patient identified by MRN, date of birth, ID band Patient awake    Reviewed: Allergy & Precautions, NPO status , Patient's Chart, lab work & pertinent test results, reviewed documented beta blocker date and time   Airway Mallampati: III  TM Distance: >3 FB Neck ROM: Full    Dental  (+) Dental Advisory Given, Poor Dentition, Chipped   Pulmonary COPD,  COPD inhaler, Current Smoker and Patient abstained from smoking.   Pulmonary exam normal breath sounds clear to auscultation       Cardiovascular hypertension, Pt. on home beta blockers Normal cardiovascular exam+ dysrhythmias Supra Ventricular Tachycardia  Rhythm:Regular Rate:Normal     Neuro/Psych  PSYCHIATRIC DISORDERS Anxiety Depression     Neuromuscular disease    GI/Hepatic PUD,GERD  Medicated and Controlled,,(+) Hepatitis -, C  Endo/Other  negative endocrine ROS    Renal/GU negative Renal ROS     Musculoskeletal  (+) Arthritis , Rheumatoid disorders,  Fibromyalgia -LEFT HIP FRACTURE   Abdominal   Peds  Hematology  (+) Blood dyscrasia, anemia Plt 269k   Anesthesia Other Findings Day of surgery medications reviewed with the patient.  Right arm restricted d/t breast cancer  Reproductive/Obstetrics                             Anesthesia Physical Anesthesia Plan  ASA: 3  Anesthesia Plan: General   Post-op Pain Management: Ofirmev  IV (intra-op)* and Toradol  IV (intra-op)*   Induction: Intravenous  PONV Risk Score and Plan: 2 and Dexamethasone  and Ondansetron   Airway Management Planned: Oral ETT  Additional Equipment:   Intra-op Plan:   Post-operative Plan: Extubation in OR  Informed Consent: I have reviewed the patients History and Physical, chart, labs and discussed the procedure including the risks, benefits and alternatives for the proposed anesthesia with the patient or authorized representative who has  indicated his/her understanding and acceptance.     Dental advisory given  Plan Discussed with: CRNA  Anesthesia Plan Comments:         Anesthesia Quick Evaluation

## 2023-05-21 NOTE — Anesthesia Procedure Notes (Signed)
 Procedure Name: Intubation Date/Time: 05/21/2023 12:38 PM  Performed by: Para Jerelene CROME, CRNAPre-anesthesia Checklist: Patient identified, Suction available, Emergency Drugs available and Patient being monitored Patient Re-evaluated:Patient Re-evaluated prior to induction Oxygen Delivery Method: Circle system utilized Preoxygenation: Pre-oxygenation with 100% oxygen Induction Type: IV induction Ventilation: Mask ventilation without difficulty Laryngoscope Size: Mac and 4 Grade View: Grade II Tube type: Oral Laser Tube: Cuffed inflated with minimal occlusive pressure - saline Tube size: 7.0 mm Number of attempts: 1 Airway Equipment and Method: Stylet Placement Confirmation: ETT inserted through vocal cords under direct vision, positive ETCO2, CO2 detector and breath sounds checked- equal and bilateral Secured at: 21 cm Tube secured with: Tape Dental Injury: Teeth and Oropharynx as per pre-operative assessment  Comments: Atraumatic intubation. Lips and teeth remain in preoperative condition.

## 2023-05-21 NOTE — Op Note (Signed)
 Date of Surgery: 05/21/2023  INDICATIONS: Ms. Clagett is a 68 y.o.-year-old female with displaced left subtrochanteric fracture through her previous 3 cannulated screws placement.  The risk and benefits of the procedure were discussed in detail and documented in the pre-operative evaluation.   PREOPERATIVE DIAGNOSIS: 1.  Left periprosthetic subtrochanteric femur fracture  POSTOPERATIVE DIAGNOSIS: Same.  PROCEDURE: 1.  Left hip cephalomedullary nailing 2.  Left hip removal of deep screws, hardware  SURGEON: Elspeth LITTIE Parker MD  ASSISTANT: Conley Funk, ATC  ANESTHESIA:  general  IV FLUIDS AND URINE: See anesthesia record.  ANTIBIOTICS: Ancef   ESTIMATED BLOOD LOSS: 100 mL.  IMPLANTS:  Implant Name Type Inv. Item Serial No. Manufacturer Lot No. LRB No. Used Action  NAIL IM AFFIXUS [id number] 125D LT - [id number] Nail NAIL IM AFFIXUS 9X400 125D LT  [employer]IMMER [employer]ON(ORTH,TRAU,BIO,SG) [id number] Left 1 Implanted  HIP FRA NAIL LAG SCREW 10.5X90 - [id number] Orthopedic Implant HIP FRA NAIL LAG SCREW 10.5X90  ZIMMER [employer]ON(ORTH,TRAU,BIO,SG) [id number] Left 1 Implanted  SCREW BONE CORTICAL 5.0X42 - [id number] Screw SCREW BONE CORTICAL 5.0X42  ZIMMER RECON(ORTH,TRAU,BIO,SG) [id number] Left 1 Implanted    DRAINS: None  CULTURES: None  COMPLICATIONS: none  DESCRIPTION OF PROCEDURE:   The patient's chart/medical history was reviewed and decision was made to administer peri-operative trans-exemic acid.  I have reviewed the patient's history and given the presence of a fragility fracture, I have deemed the necessity of a osteoporosis management referral or confirmed that the patient is currently enrolled in a osteoporosis treatment program.   The patient was identified in the preoperative holding area and the correct site was marked according universal protocol with nursing, and was subsequently taken back to the operating room.  Anesthesia was induced.  Antibiotics were given 1 hour prior to skin  incision.  The patient was placed on the Hana table.  The traction post was placed.  Gross traction and internal rotation was able to provide the best reduction.  The patient was subsequently prepped and draped in the usual sterile fashion.   Final timeout was performed.  A posterolateral approach to the greater trochanter was used.  This was done 3 cm proximal to the greater trochanter.  10 blade was used to incise through skin and IT band.  Blunt dissection was performed down to the level of the greater trochanter.  The pin was placed under direct fluoroscopic visualization at the center of the greater trochanter at the tip.  This was malleted into place down to the level of the lesser trochanter.  Opening reamer was then used.  A ball-tipped guidewire was then place to distal metaphyseal bone in the femur.  This was passed across the fracture site.  The ball wire was then measured and the appropriate size nail size long nail in 11mm was taken.  Size 12.5 reamer was used over the guidewire.  Nail was introduced.  The cephalomedullary wire was placed.  Again measurement was taken after confirming center center on the AP lateral fluoroscopy.  The appropriate size screw was then placed into the neck component of the femur.  The screw was tightened and then backed off one quarter term to allow for compression.  Distal interlocks were then placed.  This was done during perfect circle technique.  15 blade was used to incise through skin and IT band.  Drill was then used bicortically.  Screw sizes were measured and then 2 screws were placed both in static fashion.  The jig was removed.  The wounds were  thoroughly irrigated.  Final fluoroscopy was confirmed good reduction on AP and laterals.  Wounds were closed in layers of 0 Vicryl 2-0 Vicryl and staples.  An Aquacel dressing was placed. All counts were correct at the end of the case. The patient was awoken and taken to the PACU without complication.       POSTOPERATIVE PLAN: She will be weightbearing as tolerated on the left hip.  She will be placed on aspirin  for blood clot prevention.  Elspeth LITTIE Parker, MD 2:01 PM

## 2023-05-21 NOTE — Discharge Instructions (Signed)
     Discharge Instructions    Attending Surgeon: Elspeth Parker, MD Office Phone Number: [phone]   Diagnosis and Procedures:    Surgeries Performed: Left hip nailing  Discharge Plan:    Diet: Resume usual diet. Begin with light or bland foods.  Drink plenty of fluids.  Activity:  Weight bearing as tolerated left hip. You are advised to go home directly from the hospital or surgical center. Restrict your activities.  GENERAL INSTRUCTIONS: 1.  Please apply ice to your wound to help with swelling and inflammation. This will improve your comfort and your overall recovery following surgery.     2. Please call Dr. Danetta office at [phone] with questions Monday-Friday during business hours. If no one answers, please leave a message and someone should get back to the patient within 24 hours. For emergencies please call 911 or proceed to the emergency room.   3. Patient to notify surgical team if experiences any of the following: Bowel/Bladder dysfunction, uncontrolled pain, nerve/muscle weakness, incision with increased drainage or redness, nausea/vomiting and Fever greater than 101.0 F.  Be alert for signs of infection including redness, streaking, odor, fever or chills. Be alert for excessive pain or bleeding and notify your surgeon immediately.  WOUND INSTRUCTIONS:   Leave your dressing, cast, or splint in place until your post operative visit.  Keep it clean and dry.  Always keep the incision clean and dry until the staples/sutures are removed. If there is no drainage from the incision you should keep it open to air. If there is drainage from the incision you must keep it covered at all times until the drainage stops  Do not soak in a bath tub, hot tub, pool, lake or other body of water  until 21 days after your surgery and your incision is completely dry and healed.  If you have removable sutures (or staples) they must be removed 10-14 days (unless otherwise  instructed) from the day of your surgery.     1)  Elevate the extremity as much as possible.  2)  Keep the dressing clean and dry.  3)  Please call us  if the dressing becomes wet or dirty.  4)  If you are experiencing worsening pain or worsening swelling, please call.

## 2023-05-21 NOTE — Progress Notes (Signed)
 I walked into the pt's room around 925 when she was getting vitals and pt complained that she didn't want to be woken up.  I asked her about her half empty bag of potassium and she said she turned off the machine bc she wanted to sleep.

## 2023-05-21 NOTE — Anesthesia Postprocedure Evaluation (Signed)
 Anesthesia Post Note  Patient: Ronal HERO Tosh  Procedure(s) Performed: INTRAMEDULLARY (IM) NAIL INTERTROCHANTERIC WITH HARDWARE REMOVAL (Left)     Patient location during evaluation: PACU Anesthesia Type: General Level of consciousness: awake and alert Pain management: pain level controlled Vital Signs Assessment: post-procedure vital signs reviewed and stable Respiratory status: spontaneous breathing, nonlabored ventilation, respiratory function stable and patient connected to nasal cannula oxygen Cardiovascular status: blood pressure returned to baseline and stable Postop Assessment: no apparent nausea or vomiting Anesthetic complications: no   No notable events documented.  Last Vitals:  Vitals:   05/21/23 1403 05/21/23 1415  BP: (!) 178/73 (!) 157/100  Pulse: 75 74  Resp: 13 15  Temp: 36.4 C   SpO2: 95% 94%    Last Pain:  Vitals:   05/21/23 1403  TempSrc:   PainSc: 0-No pain                 Garnette FORBES Skillern

## 2023-05-21 NOTE — TOC Initial Note (Signed)
 Transition of Care [facility]) - Initial/Assessment Note    Patient Details  Name: [name] MRN: [id number] Date of Birth: August 19, 1955  [facility]) CM/SW Contact:    Bascom Service, RN Phone Number: 05/21/2023, 1:04 PM  Clinical Narrative: Active w/[facility].await post L hip sx-IM nail.               Patient Goals and CMS Choice            Expected Discharge Plan and Services                                              Prior Living Arrangements/Services                       Activities of Daily Living      Permission Sought/Granted                  Emotional Assessment              Admission diagnosis:  Fall from slip, trip, or stumble, initial encounter [W01.0XXA] Closed left hip fracture, initial encounter (HCC) [S72.002A] S/p left hip fracture [Z87.81] Patient Active Problem List   Diagnosis Date Noted   Accidental fall 05/21/2023   S/p left hip fracture 05/20/2023   Lipoid pneumonia (HCC) 12/06/2022   COPD (chronic obstructive pulmonary disease) (HCC) 12/06/2022   Spondylosis of lumbar region without myelopathy or radiculopathy 09/26/2016   Cervical pain 08/21/2015   Degeneration of thoracic intervertebral disc 08/21/2015   Adhesive capsulitis of shoulder 08/21/2015   Chronic pain syndrome 08/21/2015   Osteoarthritis 03/22/2015   Cough    Hypokalemia 08/15/2014   Nausea with vomiting 08/15/2014   Diarrhea 08/15/2014   Flu-like symptoms 08/15/2014   Breast cancer, female (HCC) 07/28/2014   Chemotherapy induced nausea and vomiting 03/28/2014   Palmar plantar erythrodysaesthesia 03/18/2014   Rash 03/04/2014   Bronchitis 03/04/2014   Anorexia 03/04/2014   Altered taste 03/04/2014   Weight loss 03/04/2014   Insomnia 03/04/2014   Transaminitis 03/04/2014   Extremity edema 02/25/2014   Dehydration 02/25/2014   Thrush of mouth and esophagus (HCC) 02/02/2014   Diarrhea due to drug 02/02/2014   Breast  cancer of upper-outer quadrant of right female breast (HCC) 12/20/2013   Rheumatoid arthritis (HCC) 04/03/2013   TOBACCO ABUSE 06/16/2009   PSVT 06/16/2009   FIBROMYALGIA 06/16/2009   HEPATITIS B 07/17/2006   HEPATITIS C 07/17/2006   GASTROESOPHAGEAL REFLUX, NO ESOPHAGITIS 07/17/2006   PCP:  Leonce Carola PARAS, PA-C Pharmacy:   [id number] - [place], [place] - [address] AT [facility] [address] [facility] [address] [place] [address] Phone: [phone] Fax: [phone]     Social Drivers of Health (SDOH) Social History: SDOH Screenings   Food Insecurity: No Food Insecurity (03/25/2023)   Received from [facility] Needs: Unmet Transportation Needs (03/25/2023)   Received from [facility]  Utilities: Not At Risk (03/25/2023)   Received from [facility]  Depression (PHQ2-9): Low Risk  (03/24/2020)  Financial Resource Strain: Medium Risk (01/06/2023)   Received from [facility]  Physical Activity: Insufficiently Active (01/06/2023)   Received from [facility]  Social Connections: Socially Integrated (01/06/2023)   Received from [facility]  Stress: No Stress Concern Present (03/25/2023)   Received from [facility]  Recent Concern: Stress - Stress Concern Present (01/06/2023)   Received from [facility]  Tobacco Use: High Risk (05/20/2023)   SDOH Interventions:     Readmission Risk Interventions     No data to display

## 2023-05-21 NOTE — H&P (Signed)
 ORTHOPAEDIC CONSULTATION  REQUESTING PHYSICIAN: Kathrin Mignon DASEN, MD  Chief Complaint: Left hip pain  HPI: [name] is a 68 y.o. female who presents with a left hip subsequent trochanteric femur fracture after periprosthetic fracture around previous 3 screws procedure which was done in [facility].  She has been back to home independently.  She uses a walker at baseline.  She has significant pain in the left hip but denies pain elsewhere.  She is not on any anticoagulation  Past Medical History:  Diagnosis Date   Allergy    Phreesia 03/21/2020   Anemia    Phreesia 03/21/2020   Anxiety    Arthritis    Phreesia 03/21/2020   Blood transfusion without reported diagnosis    Phreesia 03/21/2020   Cancer (HCC)    right breast cancer   Depression    Depression    Phreesia 03/21/2020   Fibromyalgia    GERD (gastroesophageal reflux disease)    Phreesia 03/21/2020   Heavy smoker    Hepatitis C    Pt was given too much interferon - critical care x2d, 5 days total hosp   Neuromuscular disorder (HCC)    Phreesia 03/21/2020   Osteoporosis    Perimenopausal 11/18/2003   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), hypoglycemia 2/04-resolved with PO calcium  + D, SVT, was treated with iterferon and ribavirin-10/3-2/04   Personal history of chemotherapy    PUD (peptic ulcer disease)    Rheumatoid arthritis(714.0)    possible   SVT (supraventricular tachycardia) (HCC)    Wears glasses    Past Surgical History:  Procedure Laterality Date   ABDOMINAL HYSTERECTOMY     BREAST SURGERY N/A    Phreesia 03/21/2020   CESAREAN SECTION  09/17/1980   CESAREAN SECTION N/A    Phreesia 03/21/2020   COLONOSCOPY     DILATION AND CURETTAGE OF UTERUS     hysterectomy-for dysmenorrhea  09/17/2000   JOINT REPLACEMENT N/A    Phreesia 03/21/2020   MASTECTOMY Right 2016   MASTECTOMY W/ SENTINEL NODE BIOPSY Right 07/28/2014   Procedure: RIGHT  MASTECTOMY WITH SENTINEL LYMPH NODE BIOPSY ;  Surgeon: Jina Nephew, MD;  Location: [facility];  Service: General;  Laterality: Right;   NASAL SINUS SURGERY  03/20/1984   for deviated septum   PORTACATH PLACEMENT Left 01/20/2014   Procedure: INSERTION PORT-A-CATH;  Surgeon: Jina Nephew, MD;  Location: [facility];  Service: General;  Laterality: Left;   TONSILLECTOMY  05/20/1961   UPPER GI ENDOSCOPY     Social History   Socioeconomic History   Marital status: Divorced    Spouse name: Not on file   Number of children: Not on file   Years of education: Not on file   Highest education level: Not on file  Occupational History   Occupation: disability  Tobacco Use   Smoking status: Every Day   Smokeless tobacco: Never   Tobacco comments:    Pt is currently vapping ARJ 12/06/22  Vaping Use   Vaping status: Every Day  Substance and Sexual Activity   Alcohol use: Not Currently    Comment: denies; reports 3-4 beers/night to Dr. Ricardo with 6 pack on weekend; hx of 12 pack/day EtOH abuse in 93    Drug use: No    Comment: had perscription drug abuse problem in 1993; previous IVDU/inranasal cocaine in her 20s-contracted   Sexual activity: Not Currently    Birth control/protection: Surgical  Other Topics Concern   Not on file  Social History Narrative   Divorced, lives with son Abby (born 69); parents nearby. employed as print production planner at [employer].. GED plus 1 yr of technical college    Hepatitis B & C   Has 2 dogs and 5 cats   Did inpatient substance abuse rehab in 1993         Social Drivers of Health   Financial Resource Strain: Medium Risk (01/06/2023)   Received from [facility]   Overall Financial Resource Strain (CARDIA)    Difficulty of Paying Living Expenses: Somewhat hard  Food Insecurity: No Food Insecurity (03/25/2023)   [facility]   Hunger Vital Sign    Worried About Running Out of Food in the Last Year: Never  true    Ran Out of Food in the Last Year: Never true  Transportation Needs: Unmet Transportation Needs (03/25/2023)   Received from [facility] - Transportation    Lack of Transportation (Medical): Yes    Lack of Transportation (Non-Medical): Yes  Physical Activity: Insufficiently Active (01/06/2023)   Received from [facility]   Exercise Vital Sign    Days of Exercise per Week: 1 day    Minutes of Exercise per Session: 20 min  Stress: No Stress Concern Present (03/25/2023)   Received from [facility] - Occupational Stress Questionnaire    Feeling of Stress : Only a little  Recent Concern: Stress - Stress Concern Present (01/06/2023)   Received from [facility] of [facility] Health - Occupational Stress Questionnaire    Feeling of Stress : Rather much  Social Connections: Socially Integrated (01/06/2023)   Received from [facility]   Social Network    How would you rate your social network (family, work, friends)?: Good participation with social networks   Family History  Problem Relation Age of Onset   Dementia Mother    Hypertension Mother    Hyperlipidemia Mother    Cancer Father 47       lung   Dementia Father    Breast cancer Maternal Aunt    Cancer Paternal Uncle    Breast cancer Cousin    Colon cancer Neg Hx    - negative except otherwise stated in the family history section Allergies  Allergen Reactions   Pegfilgrastim  Hives    Blistering rash all over body   Codeine Itching and Nausea Only    Pt has had codeine with pre-administration to prevent reaction without any problem.  Other Reaction(s): nausea and vomiting, itching   Prior to Admission medications   Medication Sig Start Date End Date Taking? Authorizing Provider  ALPRAZolam  (XANAX ) 0.5 MG tablet Take 0.5 mg by mouth 2 (two) times daily as needed for anxiety. 11/28/22  Yes [provider]  atenolol  (TENORMIN ) 50  MG tablet TAKE 1 TABLET(50 MG) BY MOUTH TWICE DAILY Patient taking differently: Take 50 mg by mouth daily. TAKE 2 TABLET(50 MG) BY MOUTH TWICE DAILY 09/11/22  Yes Crenshaw, Redell RAMAN, MD  B Complex-C (B-COMPLEX WITH VITAMIN C) tablet Take 1 tablet by mouth 2 (two) times a week.   Yes [provider]  famotidine  (PEPCID ) 40 MG tablet Take 40 mg by mouth at bedtime. 01/22/23  Yes [provider]  fluticasone  (FLONASE ) 50 MCG/ACT nasal spray Place 2 sprays into both nostrils daily. 01/30/23  Yes Soldatova, Liuba, MD  gabapentin  (NEURONTIN ) 600 MG tablet  Take 600 mg by mouth 3 (three) times daily. 03/20/21  Yes [provider]  Misc Natural Products (TUMERSAID) TABS as directed Orally   Yes [provider]  Multiple Vitamins tablet Take by mouth.   Yes [provider]  Multiple Vitamins-Minerals (WOMENS MULTIVITAMIN PO) Take 1 tablet by mouth 3 (three) times a week.   Yes [provider]  oxyCODONE -acetaminophen  (PERCOCET/ROXICET) 5-325 MG tablet Take 1-2 tablets by mouth every 8 (eight) hours as needed for severe pain (pain score 7-10). 05/12/23  Yes Gudena, Vinay, MD  pantoprazole  (PROTONIX ) 40 MG tablet Take 40 mg by mouth 2 (two) times daily.   Yes [provider]  predniSONE  (DELTASONE ) 10 MG tablet TAKE 1 TABLET(10 MG) BY MOUTH DAILY WITH BREAKFAST 04/21/23  Yes Gudena, Vinay, MD  sertraline  (ZOLOFT ) 50 MG tablet Take 25 mg by mouth at bedtime. 03/11/23  Yes [provider]  Turmeric (QC TUMERIC COMPLEX PO) Take 1 tablet by mouth daily.   Yes [provider]  ibuprofen  (ADVIL ,MOTRIN ) 600 MG tablet Take 1 tablet (600 mg total) by mouth every 6 (six) hours as needed. Patient not taking: Reported on 03/20/2023 02/03/17   Olympia Gee, PA-C  letrozole  (FEMARA ) 2.5 MG tablet Take 1 tablet (2.5 mg total) by mouth daily. Patient not taking: Reported on 03/20/2023 11/07/22   Gudena, Vinay, MD  ondansetron  (ZOFRAN ) 8 MG tablet Take 1  tablet (8 mg total) by mouth every 8 (eight) hours as needed for nausea or vomiting. Patient not taking: Reported on 03/20/2023 12/12/22   Gudena, Vinay, MD  Tiotropium Bromide-Olodaterol (STIOLTO RESPIMAT ) 2.5-2.5 MCG/ACT AERS Inhale 2 puffs into the lungs daily. Patient not taking: Reported on 03/20/2023 03/04/23   Orlie Madelin RAMAN, NP   DG Chest 1 View Result Date: 05/20/2023 CLINICAL DATA:  Fall with left hip fracture, breast cancer EXAM: CHEST  1 VIEW COMPARISON:  11/29/2022 chest radiograph. FINDINGS: Left subclavian Port-A-Cath terminates in the middle third of the SVC. Stable cardiomediastinal silhouette with top-normal heart size. No pneumothorax. No pleural effusion. Lungs appear clear, with no acute consolidative airspace disease and no pulmonary edema. Surgical clips overlie the right axilla. IMPRESSION: No active cardiopulmonary disease. Electronically Signed   By: Selinda DELENA Blue M.D.   On: 05/20/2023 22:20   DG HIP UNILAT W OR W/O PELVIS 2-3 VIEWS LEFT Result Date: 05/20/2023 CLINICAL DATA:  Fall onto left hip today, recent left hip ORIF 03/25/2023 EXAM: DG HIP (WITH OR WITHOUT PELVIS) 2-3V LEFT COMPARISON:  None Available. FINDINGS: Acute proximal left femoral metaphysis fracture involving the inferior portions of the trochanters with mild over riding and 1 cm lateral displacement of the distal fracture fragment. Three intact fixation pins in the left femoral neck. No hip dislocation. Right total hip arthroplasty without hardware complication. No pelvic diastasis. No focal osseous lesions. Mild lower lumbar degenerative changes. IMPRESSION: Acute proximal left femoral metaphysis fracture involving the inferior portions of the trochanters with mild over-riding and 1 cm lateral displacement of the distal fracture fragment. Three intact fixation pins in the left femoral neck. Right total hip arthroplasty without complication. Electronically Signed   By: Selinda DELENA Blue M.D.   On: 05/20/2023 22:19      Positive ROS: All other systems have been reviewed and were otherwise negative with the exception of those mentioned in the HPI and as above.  Physical Exam: General: No acute distress Cardiovascular: No pedal edema Respiratory: No cyanosis, no use of accessory musculature GI: No organomegaly, abdomen is soft and non-tender  Skin: No lesions in the area of chief complaint Neurologic: Sensation intact distally Psychiatric: Patient is at baseline mood and affect Lymphatic: No axillary or cervical lymphadenopathy  MUSCULOSKELETAL:  Left hip is shortened externally rotated.  Fires tibialis anterior as well as gastrocsoleus with 2+ dorsalis pedis pulse.  Sensation is intact throughout  Independent Imaging Review: X-ray 3 views left hip: Displaced subtrochanteric femur fracture around previous 3 screws with healed femoral neck  Assessment: 68 year old female with a displaced left subtrochanteric hip fracture after a mechanical fall.  At this point given the fact that she is hoping to regain mobility status I have recommended cephalomedullary nail fixation in order to allow for early mobilization send to avoid the complications that come with being bedbound.  I would recommend removal of her screws prior to this so that we can ultimately place a cephalomedullary nail.  I did discuss the risks and limitations as well as the associated complications.  At this time she is elected for this.  Plan: Plan for left hip cephalomedullary nailing with removal of hardware   After a lengthy discussion of treatment options, including risks, benefits, alternatives, complications of surgical and nonsurgical conservative options, the patient elected surgical repair.   The patient  is aware of the material risks  and complications including, but not limited to injury to adjacent structures, neurovascular injury, infection, numbness, bleeding, implant failure, thermal burns, stiffness, persistent pain,  failure to heal, disease transmission from allograft, need for further surgery, dislocation, anesthetic risks, blood clots, risks of death,and others. The probabilities of surgical success and failure discussed with patient given their particular co-morbidities.The time and nature of expected rehabilitation and recovery was discussed.The patient's questions were all answered preoperatively.  No barriers to understanding were noted. I explained the natural history of the disease process and Rx rationale.  I explained to the patient what I considered to be reasonable expectations given their personal situation.  The final treatment plan was arrived at through a shared patient decision making process model.   Thank you for the consult and the opportunity to see [name]  [name] Parker, MD [facility] 12:02 PM

## 2023-05-21 NOTE — Brief Op Note (Signed)
   Brief Op Note  Date of Surgery: 05/21/2023  Preoperative Diagnosis: LEFT HIP FRACTURE  Postoperative Diagnosis: same  Procedure: Procedure(s): INTRAMEDULLARY (IM) NAIL INTERTROCHANTERIC WITH HARDWARE REMOVAL  Implants: Implant Name Type Inv. Item Serial No. Manufacturer Lot No. LRB No. Used Action  NAIL IM AFFIXUS [id number] 125D LT - [id number] Nail NAIL IM AFFIXUS 9X400 125D LT  [employer](ORTH,TRAU,BIO,SG) [id number] Left 1 Implanted  HIP FRA NAIL LAG SCREW 10.5X90 - [id number] Orthopedic Implant HIP FRA NAIL LAG SCREW 10.5X90  [employer](ORTH,TRAU,BIO,SG) [id number] Left 1 Implanted  SCREW BONE CORTICAL 5.0X42 - [id number] Screw SCREW BONE CORTICAL 5.0X42  [employer](ORTH,TRAU,BIO,SG) [id number] Left 1 Implanted    Surgeons: Surgeon(s): Genelle Standing, MD  Anesthesia: Choice    Estimated Blood Loss: See anesthesia record  Complications: None  Condition to PACU: Stable  Standing LITTIE Genelle, MD 05/21/2023 2:00 PM

## 2023-05-22 ENCOUNTER — Encounter (HOSPITAL_COMMUNITY): Payer: Self-pay | Admitting: Orthopaedic Surgery

## 2023-05-22 DIAGNOSIS — W19XXXA Unspecified fall, initial encounter: Secondary | ICD-10-CM

## 2023-05-22 DIAGNOSIS — Z8781 Personal history of (healed) traumatic fracture: Secondary | ICD-10-CM | POA: Diagnosis not present

## 2023-05-22 DIAGNOSIS — J449 Chronic obstructive pulmonary disease, unspecified: Secondary | ICD-10-CM | POA: Diagnosis not present

## 2023-05-22 LAB — BASIC METABOLIC PANEL
Anion gap: 9 (ref 5–15)
BUN: 14 mg/dL (ref 8–23)
CO2: 18 mmol/L — ABNORMAL LOW (ref 22–32)
Calcium: 8.6 mg/dL — ABNORMAL LOW (ref 8.9–10.3)
Chloride: 106 mmol/L (ref 98–111)
Creatinine, Ser: 0.82 mg/dL (ref 0.44–1.00)
GFR, Estimated: >60 mL/min (ref >60)
Glucose, Bld: 112 mg/dL — ABNORMAL HIGH (ref 70–99)
Potassium: 4.6 mmol/L (ref 3.5–5.1)
Sodium: 133 mmol/L — ABNORMAL LOW (ref 135–145)

## 2023-05-22 LAB — CBC
HCT: 31.4 % — ABNORMAL LOW (ref 36.0–46.0)
Hemoglobin: 10 g/dL — ABNORMAL LOW (ref 12.0–15.0)
MCH: 26.6 pg (ref 26.0–34.0)
MCHC: 31.8 g/dL (ref 30.0–36.0)
MCV: 83.5 fL (ref 80.0–100.0)
Platelets: 259 10*3/uL (ref 150–400)
RBC: 3.76 MIL/uL — ABNORMAL LOW (ref 3.87–5.11)
RDW: 14.5 % (ref 11.5–15.5)
WBC: 12.1 10*3/uL — ABNORMAL HIGH (ref 4.0–10.5)
nRBC: 0 % (ref 0.0–0.2)

## 2023-05-22 LAB — MAGNESIUM: Magnesium: 1.9 mg/dL (ref 1.7–2.4)

## 2023-05-22 LAB — VITAMIN D 25 HYDROXY (VIT D DEFICIENCY, FRACTURES): Vit D, 25-Hydroxy: 21.47 ng/mL — ABNORMAL LOW (ref 30–100)

## 2023-05-22 MED ORDER — OXYCODONE HCL 5 MG PO TABS
10.0000 mg | ORAL_TABLET | Freq: Four times a day (QID) | ORAL | Status: DC | PRN
  Administered 2023-05-22 – 2023-05-24 (×7): 10 mg via ORAL
  Filled 2023-05-22 (×7): qty 2

## 2023-05-22 MED ORDER — VITAMIN D 25 MCG (1000 UNIT) PO TABS
2000.0000 [IU] | ORAL_TABLET | Freq: Every day | ORAL | Status: DC
Start: 2023-05-22 — End: 2023-05-24
  Administered 2023-05-22 – 2023-05-23 (×2): 2000 [IU] via ORAL
  Filled 2023-05-22 (×2): qty 2

## 2023-05-22 MED ORDER — HYDROMORPHONE HCL 1 MG/ML IJ SOLN
0.5000 mg | INTRAMUSCULAR | Status: DC | PRN
  Administered 2023-05-22: 0.5 mg via INTRAVENOUS
  Filled 2023-05-22: qty 0.5

## 2023-05-22 NOTE — Progress Notes (Signed)
 PROGRESS NOTE  [name] FMW:[id number] DOB: 01/12/1956   PCP: Leonce Carola PARAS, PA-C  Patient is from: Home.  Lives at home.  Independently ambulates at baseline.  DOA: 05/20/2023 LOS: 2  Chief complaints Chief Complaint  Patient presents with   Fall     Brief Narrative / Interim history: 68 year old F with PMH of COPD, stage IIa breast cancer s/p mastectomy in 2016.  On letrozole , RA, severe OSA, recent fall leading to left hip fracture s/p pinning and cannulated screw on 11/5 brought to ED by EMS after she had accidental fall, and admitted with acute proximal left femoral metaphyseal fracture involving the inferior portion of the trochanters with mild overriding and lateral displacement, and acute urinary retention with > 900 cc urine output after Foley insertion.  Orthopedic surgery consulted.  Patient underwent left hip cephalomedullary nailing and removal of previous left hip deprescribed on the hardware by Dr. Genelle on 05/21/2023.  Subjective: Seen and examined earlier this morning.  Seen and examined earlier this morning.  No major events overnight of this morning.  She said she had severe left hip pain and back pain when she got up with therapy this morning.  Pain improved to 7/10 after she rested.  No other complaints.  Objective: Vitals:   05/21/23 2135 05/22/23 0121 05/22/23 0617 05/22/23 0956  BP: 123/76 129/70 132/66 119/80  Pulse: 72 71 68 66  Resp: 17 17 17 16   Temp: 98.1 F (36.7 C) 97.7 F (36.5 C) 98.3 F (36.8 C) 99.6 F (37.6 C)  TempSrc: Oral Oral Oral   SpO2: 97% 95% 98% 99%    Examination:  GENERAL: No apparent distress.  Nontoxic. HEENT: MMM.  Vision and hearing grossly intact.  NECK: Supple.  No apparent JVD.  RESP:  No IWOB.  Fair aeration bilaterally. CVS:  RRR. Heart sounds normal.  ABD/GI/GU: BS+. Abd soft, NTND.  MSK/EXT:  Moves extremities.  S/p left hip surgery.  Dressing DCI. SKIN: Above. NEURO: Awake, alert and oriented  appropriately.  No apparent focal neuro deficit. PSYCH: Calm. Normal affect.   Procedures:  1/1-Left hip cephalomedullary nailing and removal of previous left hip deprescribed on the hardware by Dr. Genelle   Microbiology summarized: None  Assessment and plan: Mechanical fall at home Acute displaced proximal left femoral metaphyseal fracture-vitamin D  21.47 Recent left hip fracture s/p pinning at [facility] on 03/25/2023 -S/p left hip cephalomedullary nailing and removal of previous left hip deprescribed on the hardware by Dr. Genelle  -Multimodal pain control and VTE prophylaxis per surgery -P.o. vitamin D  -PT/OT eval.  Expected postoperative blood loss anemia: Stable Recent Labs    05/20/23 2101 05/21/23 0513 05/22/23 0338  HGB 11.5* 11.8* 10.0*  -Continue monitoring  Chronic COPD:  No respiratory issue.  Does not use Stiolto at home. -Nebulizers as needed   Rheumatoid arthritis: Does not seem to be on DMARDs.  Takes prednisone  which seems to be prescribed by oncology -Continue home prednisone   History of breast cancer s/p mastectomy on letrozole  -Continue home letrozole  after surgery   Chronic anxiety/depression: Stable -Continue home Zoloft  and Ativan    GERD -Continue PPI   Rheumatoid arthritis Resume home regimen   Peripheral neuropathy -Continue home gabapentin    Acute urinary retention with greater than 900 cc urine retained, POA -Continue Foley catheter -Will try voiding trial prior to discharge  Hypokalemia -Monitor and replenish as appropriate  Hyponatremia: Mild -Continue monitoring  There is no height or weight on file to calculate BMI.  DVT prophylaxis:  Aspirin  325 mg daily for VTE prophylaxis  Code Status: Full code Family Communication: None at bedside Level of care: Telemetry Status is: Inpatient Remains inpatient appropriate because: Left femoral fracture   Final disposition: Likely SNF Consultants:  Orthopedic  surgery  35 minutes with more than 50% spent in reviewing records, counseling patient/family and coordinating care.   Sch Meds:  Scheduled Meds:  aspirin   325 mg Oral Daily   atenolol   50 mg Oral Daily   famotidine   40 mg Oral QHS   gabapentin   300 mg Oral BID   pantoprazole   40 mg Oral BID   predniSONE   10 mg Oral Q breakfast   sertraline   25 mg Oral QHS   Continuous Infusions:   PRN Meds:.acetaminophen , ALPRAZolam , HYDROmorphone  (DILAUDID ) injection, ipratropium-albuterol , melatonin, oxyCODONE , prochlorperazine , senna-docusate  Antimicrobials: Anti-infectives (From admission, onward)    Start     Dose/Rate Route Frequency Ordered Stop   05/21/23 1126  ceFAZolin  (ANCEF ) 2-4 GM/100ML-% IVPB       Note to Pharmacy: [name] D: cabinet override      05/21/23 1126 05/21/23 1314   05/21/23 1115  ceFAZolin  (ANCEF ) IVPB 2g/100 mL premix        2 g 200 mL/hr over 30 Minutes Intravenous On call to O.R. 05/21/23 1018 05/21/23 1301        I have personally reviewed the following labs and images: CBC: Recent Labs  Lab 05/20/23 2101 05/21/23 0513 05/22/23 0338  WBC 12.2* 12.0* 12.1*  HGB 11.5* 11.8* 10.0*  HCT 35.1* 37.5 31.4*  MCV 81.4 82.6 83.5  PLT 287 269 259   BMP &GFR Recent Labs  Lab 05/20/23 2101 05/21/23 0513 05/22/23 0338  NA 133* 135 133*  K 3.5 3.4* 4.6  CL 103 104 106  CO2 18* 22 18*  GLUCOSE 92 117* 112*  BUN 15 14 14   CREATININE 1.02* 0.98 0.82  CALCIUM  8.6* 8.9 8.6*  MG  --  1.8 1.9  PHOS  --  4.0  --    CrCl cannot be calculated (Unknown ideal weight.). Liver & Pancreas: No results for input(s): AST, ALT, ALKPHOS, BILITOT, PROT, ALBUMIN in the last 168 hours. No results for input(s): LIPASE, AMYLASE in the last 168 hours. No results for input(s): AMMONIA in the last 168 hours. Diabetic: No results for input(s): HGBA1C in the last 72 hours. No results for input(s): GLUCAP in the last 168 hours. Cardiac  Enzymes: No results for input(s): CKTOTAL, CKMB, CKMBINDEX, TROPONINI in the last 168 hours. No results for input(s): PROBNP in the last 8760 hours. Coagulation Profile: No results for input(s): INR, PROTIME in the last 168 hours. Thyroid  Function Tests: No results for input(s): TSH, T4TOTAL, FREET4, T3FREE, THYROIDAB in the last 72 hours. Lipid Profile: No results for input(s): CHOL, HDL, LDLCALC, TRIG, CHOLHDL, LDLDIRECT in the last 72 hours. Anemia Panel: No results for input(s): VITAMINB12, FOLATE, FERRITIN, TIBC, IRON, RETICCTPCT in the last 72 hours. Urine analysis:    Component Value Date/Time   COLORURINE YELLOW 07/23/2008 1000   APPEARANCEUR CLEAR 07/23/2008 1000   LABSPEC 1.007 07/23/2008 1000   PHURINE 5.5 07/23/2008 1000   GLUCOSEU NEGATIVE 07/23/2008 1000   HGBUR NEGATIVE 07/23/2008 1000   BILIRUBINUR NEGATIVE 07/23/2008 1000   KETONESUR NEGATIVE 07/23/2008 1000   PROTEINUR NEGATIVE 07/23/2008 1000   UROBILINOGEN 0.2 07/23/2008 1000   NITRITE NEGATIVE 07/23/2008 1000   LEUKOCYTESUR  07/23/2008 1000    NEGATIVE MICROSCOPIC NOT DONE ON URINES WITH NEGATIVE PROTEIN,  BLOOD, LEUKOCYTES, NITRITE, OR GLUCOSE <1000 mg/dL.   Sepsis Labs: Invalid input(s): PROCALCITONIN, LACTICIDVEN  Microbiology: Recent Results (from the past 240 hours)  Surgical pcr screen     Status: None   Collection Time: 05/21/23 11:44 AM   Specimen: Nasal Mucosa; Nasal Swab  Result Value Ref Range Status   MRSA, PCR NEGATIVE NEGATIVE Final   Staphylococcus aureus NEGATIVE NEGATIVE Final    Comment: (NOTE) The Xpert SA Assay (FDA approved for NASAL specimens in patients 62 years of age and older), is one component of a comprehensive surveillance program. It is not intended to diagnose infection nor to guide or monitor treatment. Performed at [facility], [address], [place], [place] [address]     Radiology  Studies: DG FEMUR MIN 2 VIEWS LEFT Result Date: 05/21/2023 CLINICAL DATA:  Elective surgery. EXAM: LEFT FEMUR 2 VIEWS COMPARISON:  Preoperative imaging FINDINGS: Eleven fluoroscopic spot views of the left femur obtained in the operating room. Femoral intramedullary nail with trans trochanteric and distal locking screw fixation traverse proximal femur fracture, the previous femoral hardware is removed. Fluoroscopy time 58 seconds. Dose 24.44 mGy. IMPRESSION: Intraoperative fluoroscopy during proximal femur fracture fixation. Electronically Signed   By: Andrea Gasman M.D.   On: 05/21/2023 15:19   DG C-Arm 1-60 Min-No Report Result Date: 05/21/2023 Fluoroscopy was utilized by the requesting physician.  No radiographic interpretation.   DG C-Arm 1-60 Min-No Report Result Date: 05/21/2023 Fluoroscopy was utilized by the requesting physician.  No radiographic interpretation.      Coalton Arch T. Amariz Flamenco [facility]  If 7PM-7AM, please contact night-coverage [email or web address] 05/22/2023, 1:52 PM

## 2023-05-22 NOTE — Evaluation (Signed)
 Physical Therapy Evaluation Patient Details Name: [name] MRN: [id number] DOB: Jun 28, 1955 Today's Date: 05/22/2023  History of Present Illness  68 year old female brought to ED by EMS after she had accidental fall, and admitted with acute proximal left femoral metaphyseal fracture involving the inferior portion of the trochanters with mild overriding and lateral displacement, and acute urinary retention. orthopedics consulted. pt underwent left hip cephalomedullary nailing and removal of previous hardware by Dr. Genelle on 05/21/2023.   PMH: COPD, stage IIa breast cancer s/p mastectomy in 2016, RA, severe OSA, recent fall leading to left hip fracture s/p pinning and cannulated screw on 11/5  Clinical Impression  Pt admitted with above diagnosis.  Pt reports independence but several near falls recently while taking care of her elderly dog. Pt amb 24' with RW and min assist- CGA. Pt does not have 24hr assist at home, son is able to check on her. Patient will benefit from continued  follow up therapy, <3 hours/day at d/c   Pt currently with functional limitations due to the deficits listed below (see PT Problem List). Pt will benefit from acute skilled PT to increase their independence and safety with mobility to allow discharge.           If plan is discharge home, recommend the following: A little help with walking and/or transfers;A little help with bathing/dressing/bathroom;Help with stairs or ramp for entrance;Assistance with cooking/housework;Assist for transportation   Can travel by private vehicle   Yes    Equipment Recommendations None recommended by PT  Recommendations for Other Services       Functional Status Assessment Patient has had a recent decline in their functional status and demonstrates the ability to make significant improvements in function in a reasonable and predictable amount of time.     Precautions / Restrictions Precautions Precautions:  Fall Restrictions Weight Bearing Restrictions Per Provider Order: No Other Position/Activity Restrictions: WBAT      Mobility  Bed Mobility Overal bed mobility: Needs Assistance Bed Mobility: Supine to Sit     Supine to sit: Contact guard     General bed mobility comments: incr time and effort, CGA to elevate trunk    Transfers Overall transfer level: Needs assistance Equipment used: Rolling walker (2 wheels) Transfers: Sit to/from Stand Sit to Stand: Min assist, Contact guard assist           General transfer comment: cues for hand placement and LE position    Ambulation/Gait Ambulation/Gait assistance: Min assist Gait Distance (Feet): 22 Feet Assistive device: Rolling walker (2 wheels) Gait Pattern/deviations: Step-to pattern, Decreased step length - right, Decreased step length - left, Trunk flexed Gait velocity: decr     General Gait Details: multi-modal cues for sequence, RW position, attention to task and overall safety  Stairs            Wheelchair Mobility     Tilt Bed    Modified Rankin (Stroke Patients Only)       Balance Overall balance assessment: Needs assistance, History of Falls Sitting-balance support: No upper extremity supported, Feet supported Sitting balance-Leahy Scale: Fair     Standing balance support: During functional activity, Reliant on assistive device for balance Standing balance-Leahy Scale: Poor                               Pertinent Vitals/Pain Pain Assessment Pain Assessment: 0-10 Pain Score: 4  Pain Location: L hip Pain Descriptors / Indicators:  Aching, Grimacing, Guarding, Sore Pain Intervention(s): Limited activity within patient's tolerance, Monitored during session, Premedicated before session, Repositioned    Home Living Family/patient expects to be discharged to:: Private residence Living Arrangements: Alone Available Help at Discharge: Family;Friend(s);Available  PRN/intermittently Type of Home: House Home Access: Stairs to enter Entrance Stairs-Rails: Right Entrance Stairs-Number of Steps: 5   Home Layout: One level Home Equipment: Agricultural Consultant (2 wheels);Cane - single point      Prior Function Prior Level of Function : Independent/Modified Independent             Mobility Comments: independent - no device; has 2 cats and a dog       Extremity/Trunk Assessment   Upper Extremity Assessment Upper Extremity Assessment: Defer to OT evaluation    Lower Extremity Assessment Lower Extremity Assessment: LLE deficits/detail LLE Deficits / Details: ankle WFL, knee and hip grossly 3+/5, limited by pain    Cervical / Trunk Assessment Cervical / Trunk Assessment: Kyphotic  Communication   Communication Communication: No apparent difficulties  Cognition Arousal: Alert Behavior During Therapy: WFL for tasks assessed/performed Overall Cognitive Status: Within Functional Limits for tasks assessed                                 General Comments: tangential during PT session, requires  redirection; pt states tell me to focus        General Comments      Exercises     Assessment/Plan    PT Assessment Patient needs continued PT services  PT Problem List Decreased strength;Decreased activity tolerance;Decreased balance;Decreased mobility;Pain;Decreased knowledge of use of DME       PT Treatment Interventions DME instruction;Gait training;Functional mobility training;Therapeutic activities;Therapeutic exercise;Patient/family education    PT Goals (Current goals can be found in the Care Plan section)  Acute Rehab PT Goals Patient Stated Goal: be able to take care of pets PT Goal Formulation: With patient Time For Goal Achievement: 06/05/23 Potential to Achieve Goals: Good    Frequency Min 1X/week     Co-evaluation               AM-PAC PT 6 Clicks Mobility  Outcome Measure Help needed turning from  your back to your side while in a flat bed without using bedrails?: A Little Help needed moving from lying on your back to sitting on the side of a flat bed without using bedrails?: A Little Help needed moving to and from a bed to a chair (including a wheelchair)?: A Little Help needed standing up from a chair using your arms (e.g., wheelchair or bedside chair)?: A Little Help needed to walk in hospital room?: A Little Help needed climbing 3-5 steps with a railing? : A Lot 6 Click Score: 17    End of Session Equipment Utilized During Treatment: Gait belt Activity Tolerance: Patient tolerated treatment well Patient left: with call bell/phone within reach;in chair;with chair alarm set   PT Visit Diagnosis: Other abnormalities of gait and mobility (R26.89)    Time: 8499-8472 PT Time Calculation (min) (ACUTE ONLY): 27 min   Charges:   PT Evaluation $PT Eval Low Complexity: 1 Low PT Treatments $Gait Training: 8-22 mins PT General Charges $$ ACUTE PT VISIT: 1 Visit         Merelin Human, PT  Acute Rehab Dept [facility]) [phone]  05/22/2023   [facility] 05/22/2023, 4:27 PM

## 2023-05-22 NOTE — Progress Notes (Signed)
   Subjective:  Patient reports pain as mild.  Tolerating diet.  Pending physical therapy evaluation.  No events overnight  Objective:   VITALS:   Vitals:   05/21/23 1637 05/21/23 2135 05/22/23 0121 05/22/23 0617  BP: 135/80 123/76 129/70 132/66  Pulse: 77 72 71 68  Resp: 15 17 17 17   Temp: 97.8 F (36.6 C) 98.1 F (36.7 C) 97.7 F (36.5 C) 98.3 F (36.8 C)  TempSrc: Oral Oral Oral Oral  SpO2: 98% 97% 95% 98%   Left hip dressings are clean dry and intact.  Fires tibialis anterior as well as gastroc soleus.  Sensation is intact all distributions of the left foot with 3+ dorsalis pedis pulse.  Limb lengths are equal  Lab Results  Component Value Date   WBC 12.1 (H) 05/22/2023   HGB 10.0 (L) 05/22/2023   HCT 31.4 (L) 05/22/2023   MCV 83.5 05/22/2023   PLT 259 05/22/2023     Assessment/Plan:  1 Day Post-Op status post left hip cephalomedullary nailing overall doing well.  - Expected postop acute blood loss anemia - will monitor for symptoms - Patient to work with PT/OT to optimize mobilization safely - DVT ppx - SCDs, ambulation, aspirin  325 - Postoperative Abx: Ancef  x 2 additional doses given - WBAT operative extremity - Pain control - multimodal pain management, ATC acetaminophen  in conjunction with as needed narcotic (oxycodone ), although this should be minimized with other modalities  - Discharge planning pending CM, appreciate coordination   [name] 05/22/2023, 8:46 AM

## 2023-05-23 DIAGNOSIS — S72002A Fracture of unspecified part of neck of left femur, initial encounter for closed fracture: Secondary | ICD-10-CM

## 2023-05-23 DIAGNOSIS — J410 Simple chronic bronchitis: Secondary | ICD-10-CM | POA: Diagnosis not present

## 2023-05-23 DIAGNOSIS — Z8781 Personal history of (healed) traumatic fracture: Secondary | ICD-10-CM | POA: Diagnosis not present

## 2023-05-23 DIAGNOSIS — W010XXA Fall on same level from slipping, tripping and stumbling without subsequent striking against object, initial encounter: Secondary | ICD-10-CM

## 2023-05-23 LAB — RENAL FUNCTION PANEL
Albumin: 3.3 g/dL — ABNORMAL LOW (ref 3.5–5.0)
Anion gap: 10 (ref 5–15)
BUN: 12 mg/dL (ref 8–23)
CO2: 22 mmol/L (ref 22–32)
Calcium: 8.8 mg/dL — ABNORMAL LOW (ref 8.9–10.3)
Chloride: 100 mmol/L (ref 98–111)
Creatinine, Ser: 0.61 mg/dL (ref 0.44–1.00)
GFR, Estimated: >60 mL/min (ref >60)
Glucose, Bld: 102 mg/dL — ABNORMAL HIGH (ref 70–99)
Phosphorus: 3.7 mg/dL (ref 2.5–4.6)
Potassium: 3.6 mmol/L (ref 3.5–5.1)
Sodium: 132 mmol/L — ABNORMAL LOW (ref 135–145)

## 2023-05-23 LAB — CBC
HCT: 28.3 % — ABNORMAL LOW (ref 36.0–46.0)
Hemoglobin: 9.1 g/dL — ABNORMAL LOW (ref 12.0–15.0)
MCH: 26.6 pg (ref 26.0–34.0)
MCHC: 32.2 g/dL (ref 30.0–36.0)
MCV: 82.7 fL (ref 80.0–100.0)
Platelets: 240 10*3/uL (ref 150–400)
RBC: 3.42 MIL/uL — ABNORMAL LOW (ref 3.87–5.11)
RDW: 14.6 % (ref 11.5–15.5)
WBC: 10.8 10*3/uL — ABNORMAL HIGH (ref 4.0–10.5)
nRBC: 0 % (ref 0.0–0.2)

## 2023-05-23 LAB — MAGNESIUM: Magnesium: 1.7 mg/dL (ref 1.7–2.4)

## 2023-05-23 MED ORDER — VITAMIN D (ERGOCALCIFEROL) 1.25 MG (50000 UNIT) PO CAPS
50000.0000 [IU] | ORAL_CAPSULE | ORAL | Status: DC
  Administered 2023-05-23: 50000 [IU] via ORAL
  Filled 2023-05-23: qty 1

## 2023-05-23 MED ORDER — LACTULOSE 10 GM/15ML PO SOLN
20.0000 g | Freq: Once | ORAL | Status: AC
  Administered 2023-05-23: 20 g via ORAL
  Filled 2023-05-23: qty 30

## 2023-05-23 MED ORDER — POLYETHYLENE GLYCOL 3350 17 G PO PACK
17.0000 g | PACK | Freq: Every day | ORAL | Status: DC
  Administered 2023-05-23: 17 g via ORAL
  Filled 2023-05-23: qty 1

## 2023-05-23 MED ORDER — SENNOSIDES-DOCUSATE SODIUM 8.6-50 MG PO TABS
1.0000 | ORAL_TABLET | Freq: Two times a day (BID) | ORAL | Status: DC
  Administered 2023-05-23: 1 via ORAL
  Filled 2023-05-23 (×2): qty 1

## 2023-05-23 NOTE — Progress Notes (Addendum)
 [facility]                                                                              [name], is a 67 y.o. female, DOB - 02/01/1956, FMW:[id number] Admit date - 05/20/2023    Outpatient Primary MD for the patient is Leonce Carola PARAS, PA-C  LOS - 3  days  Chief Complaint  Patient presents with   Fall       Brief summary   Patient is a 68 year old female with COPD, stage IIa breast cancer s/p mastectomy in 2016.  On letrozole , RA, severe OSA, recent fall leading to left hip fracture s/p pinning and cannulated screw on 11/5 brought to ED by EMS after she had accidental fall, and admitted with acute proximal left femoral metaphyseal fracture involving the inferior portion of the trochanters with mild overriding and lateral displacement, and acute urinary retention with > 900 cc urine output after Foley insertion.   Orthopedic surgery consulted.  Patient underwent left hip cephalomedullary nailing and removal of previous left hip deprescribed on the hardware by Dr. Genelle on 05/21/2023.   Assessment & Plan    Principal Problem: Mechanical fall at home Acute displaced proximal left femoral metaphyseal fracture-vitamin D  21.47 Recent left hip fracture s/p pinning at [facility] on 03/25/2023 -S/p left hip cephalomedullary nailing and removal of previous left hip deprescribed on the hardware by Dr. Genelle  -Pain control, DVT prophylaxis per orthopedics -PT evaluation recommended SNF   Expected postoperative acute blood loss anemia:  -H&H stable, 9.1   Chronic COPD:   -No respiratory issue.  Does not use Stiolto at home. -Nebulizers as needed   Rheumatoid arthritis:  -Does not seem to be on DMARDs.  Takes prednisone  which seems to be prescribed by oncology -Continue home prednisone    History of breast cancer s/p mastectomy on letrozole  -Continue home letrozole  after surgery   Chronic anxiety/depression: Stable -Continue Zoloft  and Ativan    GERD -Continue  PPI     Peripheral neuropathy -Continue home gabapentin    Acute urinary retention  - with greater than 900 cc urine retained, POA, - Foley catheter placed on admission, DC'd on 1/2   Hypokalemia -Replace as needed  Vitamin D  deficiency -Vitamin D  21.47 -Placed on vitamin D  50,000 units q. Weekly until level above 30   Hyponatremia: Mild -Continue monitoring  Constipation -Placed on MiraLAX  daily, Senokot 1 tab twice daily, lactulose  x 1   Obesity Estimated body mass index is 32.76 kg/m as calculated from the following:   Height as of this encounter: 5' 2 (1.575 m).   Weight as of 03/21/23: 81.2 kg.  Code Status: Full CODE STATUS DVT Prophylaxis:     Level of Care: Level of care: Med-Surg Family Communication: Updated patient' Disposition Plan:      Remains inpatient appropriate:   Awaiting SNF   Procedures:    Left hip cephalomedullary nailing   Consultants:   Orthopedics  Antimicrobials:   Anti-infectives (From admission, onward)    Start     Dose/Rate Route Frequency Ordered Stop   05/21/23 1126  ceFAZolin  (ANCEF ) 2-4 GM/100ML-% IVPB  Note to Pharmacy: [name] D: cabinet override      05/21/23 1126 05/21/23 1314   05/21/23 1115  ceFAZolin  (ANCEF ) IVPB 2g/100 mL premix        2 g 200 mL/hr over 30 Minutes Intravenous On call to O.R. 05/21/23 1018 05/21/23 1301          Medications  aspirin   325 mg Oral Daily   atenolol   50 mg Oral Daily   cholecalciferol   2,000 Units Oral Daily   famotidine   40 mg Oral QHS   gabapentin   300 mg Oral BID   pantoprazole   40 mg Oral BID   polyethylene glycol  17 g Oral Daily   predniSONE   10 mg Oral Q breakfast   senna-docusate  1 tablet Oral BID   sertraline   25 mg Oral QHS   Vitamin D  (Ergocalciferol )  50,000 Units Oral Q7 days      Subjective:   [name] was seen and examined today.  Pain controlled.  + Constipation   Patient denies dizziness, chest pain, shortness of breath,  abdominal pain, N/V. Objective:   Vitals:   05/22/23 1740 05/22/23 2034 05/22/23 2231 05/23/23 0642  BP: 118/64  130/66 119/69  Pulse: 63  63 60  Resp: 16  15 16   Temp: 98.4 F (36.9 C)  98.2 F (36.8 C) 98.3 F (36.8 C)  TempSrc:   Oral Oral  SpO2: 99%  98% 96%  Height:  5' 2 (1.575 m)      Intake/Output Summary (Last 24 hours) at 05/23/2023 1212 Last data filed at 05/23/2023 1000 Gross per 24 hour  Intake 480 ml  Output 1030 ml  Net -550 ml     Wt Readings from Last 3 Encounters:  03/21/23 81.2 kg  03/04/23 82.1 kg  01/30/23 79.8 kg     Exam General: Alert and oriented x 3, NAD Cardiovascular: S1 S2 auscultated,  RRR Respiratory: Clear to auscultation bilaterally Gastrointestinal: Soft, nontender, nondistended, + bowel sounds Ext: no pedal edema bilaterally Neuro: no new deficits Psych: Normal affect     Data Reviewed:  I have personally reviewed following labs    CBC Lab Results  Component Value Date   WBC 10.8 (H) 05/23/2023   RBC 3.42 (L) 05/23/2023   HGB 9.1 (L) 05/23/2023   HCT 28.3 (L) 05/23/2023   MCV 82.7 05/23/2023   MCH 26.6 05/23/2023   PLT 240 05/23/2023   MCHC 32.2 05/23/2023   RDW 14.6 05/23/2023   LYMPHSABS 2.9 03/24/2020   MONOABS 0.5 04/12/2015   EOSABS 0.2 03/24/2020   BASOSABS 0.0 03/24/2020     Last metabolic panel Lab Results  Component Value Date   NA 132 (L) 05/23/2023   K 3.6 05/23/2023   CL 100 05/23/2023   CO2 22 05/23/2023   BUN 12 05/23/2023   CREATININE 0.61 05/23/2023   GLUCOSE 102 (H) 05/23/2023   GFRNONAA >60 05/23/2023   GFRAA 83 03/24/2020   CALCIUM  8.8 (L) 05/23/2023   PHOS 3.7 05/23/2023   PROT 7.7 03/24/2020   ALBUMIN 3.3 (L) 05/23/2023   LABGLOB 3.3 03/24/2020   AGRATIO 1.3 03/24/2020   BILITOT 0.3 03/24/2020   ALKPHOS 147 (H) 03/24/2020   AST 14 03/24/2020   ALT 14 03/24/2020   ANIONGAP 10 05/23/2023    CBG (last 3)  No results for input(s): GLUCAP in the last 72 hours.     Coagulation Profile: No results for input(s): INR, PROTIME in the last 168 hours.   Radiology  Studies: I have personally reviewed the imaging studies  DG FEMUR MIN 2 VIEWS LEFT Result Date: 05/21/2023 CLINICAL DATA:  Elective surgery. EXAM: LEFT FEMUR 2 VIEWS COMPARISON:  Preoperative imaging FINDINGS: Eleven fluoroscopic spot views of the left femur obtained in the operating room. Femoral intramedullary nail with trans trochanteric and distal locking screw fixation traverse proximal femur fracture, the previous femoral hardware is removed. Fluoroscopy time 58 seconds. Dose 24.44 mGy. IMPRESSION: Intraoperative fluoroscopy during proximal femur fracture fixation. Electronically Signed   By: Andrea Gasman M.D.   On: 05/21/2023 15:19   DG C-Arm 1-60 Min-No Report Result Date: 05/21/2023 Fluoroscopy was utilized by the requesting physician.  No radiographic interpretation.   DG C-Arm 1-60 Min-No Report Result Date: 05/21/2023 Fluoroscopy was utilized by the requesting physician.  No radiographic interpretation.       Nydia Distance M.D. [facility] 05/23/2023, 12:12 PM  Available via Epic secure chat 7am-7pm After 7 pm, please refer to night coverage provider listed on amion.

## 2023-05-23 NOTE — Plan of Care (Signed)
  Problem: Education: Goal: Knowledge of General Education information will improve Description: Including pain rating scale, medication(s)/side effects and non-pharmacologic comfort measures Outcome: Adequate for Discharge   Problem: Health Behavior/Discharge Planning: Goal: Ability to manage health-related needs will improve Outcome: Progressing   Problem: Clinical Measurements: Goal: Ability to maintain clinical measurements within normal limits will improve Outcome: Progressing Goal: Will remain free from infection Outcome: Progressing Goal: Diagnostic test results will improve Outcome: Progressing Goal: Respiratory complications will improve Outcome: Progressing Goal: Cardiovascular complication will be avoided Outcome: Progressing   Problem: Activity: Goal: Risk for activity intolerance will decrease Outcome: Adequate for Discharge   Problem: Nutrition: Goal: Adequate nutrition will be maintained Outcome: Completed/Met   Problem: Coping: Goal: Level of anxiety will decrease Outcome: Progressing   Problem: Elimination: Goal: Will not experience complications related to bowel motility Outcome: Progressing Goal: Will not experience complications related to urinary retention Outcome: Completed/Met   Problem: Pain Management: Goal: General experience of comfort will improve Outcome: Progressing   Problem: Safety: Goal: Ability to remain free from injury will improve Outcome: Progressing   Problem: Skin Integrity: Goal: Risk for impaired skin integrity will decrease Outcome: Adequate for Discharge   Problem: Education: Goal: Knowledge of General Education information will improve Description: Including pain rating scale, medication(s)/side effects and non-pharmacologic comfort measures Outcome: Progressing   Problem: Health Behavior/Discharge Planning: Goal: Ability to manage health-related needs will improve Outcome: Progressing   Problem: Clinical  Measurements: Goal: Ability to maintain clinical measurements within normal limits will improve Outcome: Adequate for Discharge Goal: Will remain free from infection Outcome: Progressing Goal: Diagnostic test results will improve Outcome: Progressing Goal: Respiratory complications will improve Outcome: Progressing Goal: Cardiovascular complication will be avoided Outcome: Progressing   Problem: Nutrition: Goal: Adequate nutrition will be maintained Outcome: Completed/Met   Problem: Education: Goal: Verbalization of understanding the information provided (i.e., activity precautions, restrictions, etc) will improve Outcome: Progressing   Problem: Activity: Goal: Ability to ambulate and perform ADLs will improve Outcome: Adequate for Discharge   Problem: Clinical Measurements: Goal: Postoperative complications will be avoided or minimized Outcome: Progressing

## 2023-05-23 NOTE — NC FL2 (Signed)
 [place]  MEDICAID FL2 LEVEL OF CARE FORM     IDENTIFICATION  Patient Name: [name] Birthdate: [birth year] Sex: female Admission Date (Current Location): 05/20/2023  [facility] [place] Number:  Producer, Television/film/video and Address:  [facility],  [address] [place], [place] [address]      Provider Number: [id number]  Attending Physician Name and Address:  Davia Nydia POUR, MD  Relative Name and Phone Number:  son, Tymber Stallings @ [phone]    Current Level of Care: Hospital Recommended Level of Care: Skilled Nursing Facility Prior Approval Number:    Date Approved/Denied:   PASRR Number: [id number]  Discharge Plan: SNF    Current Diagnoses: Patient Active Problem List   Diagnosis Date Noted   Accidental fall 05/21/2023   S/p left hip fracture 05/20/2023   Lipoid pneumonia (HCC) 12/06/2022   COPD (chronic obstructive pulmonary disease) (HCC) 12/06/2022   Spondylosis of lumbar region without myelopathy or radiculopathy 09/26/2016   Cervical pain 08/21/2015   Degeneration of thoracic intervertebral disc 08/21/2015   Adhesive capsulitis of shoulder 08/21/2015   Chronic pain syndrome 08/21/2015   Osteoarthritis 03/22/2015   Cough    Hypokalemia 08/15/2014   Nausea with vomiting 08/15/2014   Diarrhea 08/15/2014   Flu-like symptoms 08/15/2014   Breast cancer, female (HCC) 07/28/2014   Chemotherapy induced nausea and vomiting 03/28/2014   Palmar plantar erythrodysaesthesia 03/18/2014   Rash 03/04/2014   Bronchitis 03/04/2014   Anorexia 03/04/2014   Altered taste 03/04/2014   Weight loss 03/04/2014   Insomnia 03/04/2014   Transaminitis 03/04/2014   Extremity edema 02/25/2014   Dehydration 02/25/2014   Thrush of mouth and esophagus (HCC) 02/02/2014   Diarrhea due to drug 02/02/2014   Breast cancer of upper-outer quadrant of right female breast (HCC) 12/20/2013   Rheumatoid arthritis (HCC) 04/03/2013   TOBACCO ABUSE 06/16/2009   PSVT 06/16/2009    FIBROMYALGIA 06/16/2009   HEPATITIS B 07/17/2006   HEPATITIS C 07/17/2006   GASTROESOPHAGEAL REFLUX, NO ESOPHAGITIS 07/17/2006    Orientation RESPIRATION BLADDER Height & Weight     Self, Time, Situation, Place  Normal Continent Weight:   Height:  5' 2 (157.5 cm)  BEHAVIORAL SYMPTOMS/MOOD NEUROLOGICAL BOWEL NUTRITION STATUS      Continent Diet (regular)  AMBULATORY STATUS COMMUNICATION OF NEEDS Skin   Limited Assist Verbally Other (Comment) (surgical incision only)                       Personal Care Assistance Level of Assistance  Bathing, Dressing Bathing Assistance: Limited assistance   Dressing Assistance: Limited assistance     Functional Limitations Info  Sight, Hearing, Speech Sight Info: Adequate Hearing Info: Adequate Speech Info: Adequate    SPECIAL CARE FACTORS FREQUENCY  PT (By licensed PT), OT (By licensed OT)     PT Frequency: 5x/wk OT Frequency: 5x/wk            Contractures Contractures Info: Not present    Additional Factors Info  Code Status, Allergies, Psychotropic Code Status Info: Full Allergies Info: Pegfilgrastim , Codeine Psychotropic Info: see MAR         Current Medications (05/23/2023):  This is the current hospital active medication list Current Facility-Administered Medications  Medication Dose Route Frequency Provider Last Rate Last Admin   acetaminophen  (TYLENOL ) tablet 650 mg  650 mg Oral Q6H PRN Genelle Standing, MD   650 mg at 05/22/23 1056   ALPRAZolam  (XANAX ) tablet 0.25 mg  0.25 mg Oral BID  PRN Genelle Standing, MD   0.25 mg at 05/22/23 0104   aspirin  tablet 325 mg  325 mg Oral Daily Genelle Standing, MD   325 mg at 05/23/23 1003   atenolol  (TENORMIN ) tablet 50 mg  50 mg Oral Daily Gonfa, Taye T, MD   50 mg at 05/23/23 1004   cholecalciferol  (VITAMIN D3) 25 MCG (1000 UNIT) tablet 2,000 Units  2,000 Units Oral Daily Gonfa, Taye T, MD   2,000 Units at 05/23/23 1003   famotidine  (PEPCID ) tablet 40 mg  40 mg Oral QHS  Genelle Standing, MD   40 mg at 05/22/23 2042   gabapentin  (NEURONTIN ) capsule 300 mg  300 mg Oral BID Genelle Standing, MD   300 mg at 05/23/23 1004   HYDROmorphone  (DILAUDID ) injection 0.5 mg  0.5 mg Intravenous Q3H PRN Gonfa, Taye T, MD   0.5 mg at 05/22/23 1618   ipratropium-albuterol  (DUONEB) 0.5-2.5 (3) MG/3ML nebulizer solution 3 mL  3 mL Nebulization Q6H PRN Gonfa, Taye T, MD       melatonin tablet 5 mg  5 mg Oral QHS PRN Genelle Standing, MD       oxyCODONE  (Oxy IR/ROXICODONE ) immediate release tablet 10 mg  10 mg Oral Q6H PRN Gonfa, Taye T, MD   10 mg at 05/23/23 0708   pantoprazole  (PROTONIX ) EC tablet 40 mg  40 mg Oral BID Bokshan, Steven, MD   40 mg at 05/23/23 1003   polyethylene glycol (MIRALAX  / GLYCOLAX ) packet 17 g  17 g Oral Daily Rai, Ripudeep K, MD   17 g at 05/23/23 1003   predniSONE  (DELTASONE ) tablet 10 mg  10 mg Oral Q breakfast Genelle Standing, MD   10 mg at 05/23/23 9178   prochlorperazine  (COMPAZINE ) injection 5 mg  5 mg Intravenous Q6H PRN Genelle Standing, MD   5 mg at 05/21/23 9148   senna-docusate (Senokot-S) tablet 1 tablet  1 tablet Oral BID Rai, Ripudeep K, MD   1 tablet at 05/23/23 1004   sertraline  (ZOLOFT ) tablet 25 mg  25 mg Oral QHS Gonfa, Taye T, MD   25 mg at 05/22/23 2042   Vitamin D  (Ergocalciferol ) (DRISDOL ) 1.25 MG (50000 UNIT) capsule 50,000 Units  50,000 Units Oral Q7 days Davia Nydia POUR, MD   50,000 Units at 05/23/23 1003     Discharge Medications: Please see discharge summary for a list of discharge medications.  Relevant Imaging Results:  Relevant Lab Results:   Additional Information SS# [id number]  NORMAN ASPEN, LCSW

## 2023-05-23 NOTE — Progress Notes (Signed)
 Physical Therapy Treatment Patient Details Name: [name] MRN: [id number] DOB: Nov 23, 1955 Today's Date: 05/23/2023   History of Present Illness 68 year old female brought to ED by EMS after she had accidental fall, and admitted with acute proximal left femoral metaphyseal fracture involving the inferior portion of the trochanters with mild overriding and lateral displacement, and acute urinary retention. orthopedics consulted. pt underwent left hip cephalomedullary nailing and removal of previous hardware by Dr. Genelle on 05/21/2023.   PMH: COPD, stage IIa breast cancer s/p mastectomy in 2016, RA, severe OSA, recent fall leading to left hip fracture s/p pinning and cannulated screw on 11/5    PT Comments   POD 2 s/p L cephalomedullary nail and removal of prior hardware. Patient is now limited by functional impairments (see PT problem list below) and requires CGA and use of leg lifter for bed mobility and CGA for transfers with RW and min cues. Pt focused on transferring to [facility] due to reported several episodes of loose bowels today. Pt left seated on BSC, all needs accessible and instructed to use call bell to make staff aware when ready to return to bed. Pt reported she is going to go to SNF for short term rehab and d/c plan remains appropriate.  Patient will benefit from continued skilled PT interventions to address impairments and progress towards PLOF. Acute PT will follow to progress mobility and stair training in preparation for safe discharge home.    If plan is discharge home, recommend the following: A little help with walking and/or transfers;A little help with bathing/dressing/bathroom;Help with stairs or ramp for entrance;Assistance with cooking/housework;Assist for transportation   Can travel by private vehicle     Yes  Equipment Recommendations  None recommended by PT    Recommendations for Other Services       Precautions / Restrictions Precautions Precautions:  Fall Restrictions Weight Bearing Restrictions Per Provider Order: No Other Position/Activity Restrictions: WBAT     Mobility  Bed Mobility Overal bed mobility: Needs Assistance Bed Mobility: Supine to Sit     Supine to sit: Contact guard     General bed mobility comments: pt electing to use gait belt as if leg lifter to EOB with increased time and effort    Transfers Overall transfer level: Needs assistance Equipment used: Rolling walker (2 wheels) Transfers: Sit to/from Stand, Bed to chair/wheelchair/BSC Sit to Stand: Contact guard assist Stand pivot transfers: Contact guard assist         General transfer comment: cues for hand placement and LE position with pt able to push to stand from EOB, SPT bed to [facility]    Ambulation/Gait               General Gait Details: NT, pt reported several bouts of loose bowels today and motivated to transfer to [facility] vs amb to bathroom.   Stairs             Wheelchair Mobility     Tilt Bed    Modified Rankin (Stroke Patients Only)       Balance Overall balance assessment: Needs assistance, History of Falls Sitting-balance support: No upper extremity supported, Feet supported Sitting balance-Leahy Scale: Good     Standing balance support: During functional activity, Reliant on assistive device for balance, Bilateral upper extremity supported Standing balance-Leahy Scale: Poor                              Cognition Arousal:  Alert Behavior During Therapy: WFL for tasks assessed/performed Overall Cognitive Status: Within Functional Limits for tasks assessed                                          Exercises      General Comments        Pertinent Vitals/Pain Pain Assessment Pain Assessment: 0-10 Pain Score: 3  Pain Location: L hip Pain Descriptors / Indicators: Aching, Grimacing, Guarding, Sore Pain Intervention(s): Limited activity within patient's tolerance, Monitored  during session    Home Living                          Prior Function            PT Goals (current goals can now be found in the care plan section) Acute Rehab PT Goals Patient Stated Goal: be able to take care of pets PT Goal Formulation: With patient Time For Goal Achievement: 06/05/23 Potential to Achieve Goals: Good Progress towards PT goals: Progressing toward goals    Frequency    Min 1X/week      PT Plan      Co-evaluation              AM-PAC PT 6 Clicks Mobility   Outcome Measure  Help needed turning from your back to your side while in a flat bed without using bedrails?: A Little Help needed moving from lying on your back to sitting on the side of a flat bed without using bedrails?: A Little Help needed moving to and from a bed to a chair (including a wheelchair)?: A Little Help needed standing up from a chair using your arms (e.g., wheelchair or bedside chair)?: A Little Help needed to walk in hospital room?: A Little Help needed climbing 3-5 steps with a railing? : A Lot 6 Click Score: 17    End of Session Equipment Utilized During Treatment: Gait belt Activity Tolerance: Patient tolerated treatment well Patient left: with call bell/phone within reach;Other (comment) (BSC and pt instructed to use call bell to notify staff when ready for A for hygiene and to return to bed and pt demonstraded verbal understanding) Nurse Communication: Mobility status;Other (comment) (BSC) PT Visit Diagnosis: Other abnormalities of gait and mobility (R26.89)     Time: 8247-8198 PT Time Calculation (min) (ACUTE ONLY): 9 min  Charges:    $Therapeutic Activity: 8-22 mins PT General Charges $$ ACUTE PT VISIT: 1 Visit                     [place], PT Acute Rehab   [place] VEAR Drone 05/23/2023, 6:10 PM

## 2023-05-23 NOTE — TOC Initial Note (Addendum)
 Transition of Care [facility]) - Initial/Assessment Note    Patient Details  Name: [name] MRN: [id number] Date of Birth: Oct 07, 1955  Transition of Care [facility]) CM/SW Contact:    NORMAN ASPEN, LCSW Phone Number: 05/23/2023, 1:18 PM  Clinical Narrative:                  1650 addendum: Pt has accepted SNF bed at [facility] and have received insurance authorization.  Bed ready for pt tomorrow and MD notes pt should be medically ready as well.  Will alert weekend coverage.  Will need PTAR for dc transportation.   Met with pt today to introduce CSW role and begin dc planning discussion.  Pt very pleasant and reports she lives alone and has intermittent assistance from son living locally.  She is frustrated with her current situation but hopeful she will rehab back to her baseline level of independence.  Pt aware that therapy has recommended SNF for ST rehab and she is in agreement with this plan.  We reviewed bed search and ins auth process and pt has agreed for CSW to begin bed search.  Expected Discharge Plan: Skilled Nursing Facility Barriers to Discharge: Insurance Authorization, SNF Pending bed offer   Patient Goals and CMS Choice Patient states their goals for this hospitalization and ongoing recovery are:: return home following SNF rehab          Expected Discharge Plan and Services In-house Referral: Clinical Social Work   Post Acute Care Choice: Skilled Nursing Facility Living arrangements for the past 2 months: Single Family Home                 DME Arranged: N/A DME Agency: NA                  Prior Living Arrangements/Services Living arrangements for the past 2 months: Single Family Home Lives with:: Self Patient language and need for interpreter reviewed:: Yes Do you feel safe going back to the place where you live?: Yes      Need for Family Participation in Patient Care: No (Comment) Care giver support system in place?: Yes (comment)   Criminal  Activity/Legal Involvement Pertinent to Current Situation/Hospitalization: No - Comment as needed  Activities of Daily Living   ADL Screening (condition at time of admission) Independently performs ADLs?: Yes (appropriate for developmental age) Is the patient deaf or have difficulty hearing?: No Does the patient have difficulty seeing, even when wearing glasses/contacts?: No Does the patient have difficulty concentrating, remembering, or making decisions?: No  Permission Sought/Granted Permission sought to share information with : Family Supports, Oceanographer granted to share information with : Yes, Verbal Permission Granted  Share Information with NAME: son, Marigold Mom @ [phone]  Permission granted to share info w AGENCY: SNFs        Emotional Assessment Appearance:: Appears stated age Attitude/Demeanor/Rapport: Engaged, Gracious Affect (typically observed): Accepting Orientation: : Oriented to Self, Oriented to Place, Oriented to  Time, Oriented to Situation Alcohol / Substance Use: Not Applicable Psych Involvement: No (comment)  Admission diagnosis:  Fall from slip, trip, or stumble, initial encounter [W01.0XXA] Closed left hip fracture, initial encounter (HCC) [S72.002A] S/p left hip fracture [Z87.81] Patient Active Problem List   Diagnosis Date Noted   Accidental fall 05/21/2023   S/p left hip fracture 05/20/2023   Lipoid pneumonia (HCC) 12/06/2022   COPD (chronic obstructive pulmonary disease) (HCC) 12/06/2022   Spondylosis of lumbar region without myelopathy or radiculopathy 09/26/2016  Cervical pain 08/21/2015   Degeneration of thoracic intervertebral disc 08/21/2015   Adhesive capsulitis of shoulder 08/21/2015   Chronic pain syndrome 08/21/2015   Osteoarthritis 03/22/2015   Cough    Hypokalemia 08/15/2014   Nausea with vomiting 08/15/2014   Diarrhea 08/15/2014   Flu-like symptoms 08/15/2014   Breast cancer, female (HCC)  07/28/2014   Chemotherapy induced nausea and vomiting 03/28/2014   Palmar plantar erythrodysaesthesia 03/18/2014   Rash 03/04/2014   Bronchitis 03/04/2014   Anorexia 03/04/2014   Altered taste 03/04/2014   Weight loss 03/04/2014   Insomnia 03/04/2014   Transaminitis 03/04/2014   Extremity edema 02/25/2014   Dehydration 02/25/2014   Thrush of mouth and esophagus (HCC) 02/02/2014   Diarrhea due to drug 02/02/2014   Breast cancer of upper-outer quadrant of right female breast (HCC) 12/20/2013   Rheumatoid arthritis (HCC) 04/03/2013   TOBACCO ABUSE 06/16/2009   PSVT 06/16/2009   FIBROMYALGIA 06/16/2009   HEPATITIS B 07/17/2006   HEPATITIS C 07/17/2006   GASTROESOPHAGEAL REFLUX, NO ESOPHAGITIS 07/17/2006   PCP:  Leonce Carola PARAS, PA-C Pharmacy:   [id number] - [place], [place] - [address] AT [facility] [address] & [facility] [address] [place] [address] Phone: [phone] Fax: [phone]     Social Drivers of Health (SDOH) Social History: SDOH Screenings   Food Insecurity: Patient Declined (05/22/2023)  Housing: Patient Declined (05/22/2023)  Transportation Needs: Patient Declined (05/22/2023)  Recent Concern: Transportation Needs - Unmet Transportation Needs (03/25/2023)   Received from [facility]  Utilities: Patient Declined (05/22/2023)  Depression (PHQ2-9): Low Risk  (03/24/2020)  Financial Resource Strain: Medium Risk (01/06/2023)   Received from [facility]  Physical Activity: Insufficiently Active (01/06/2023)   Received from [facility]  Social Connections: Patient Declined (05/22/2023)  Stress: No Stress Concern Present (03/25/2023)   Received from [facility]  Recent Concern: Stress - Stress Concern Present (01/06/2023)   Received from [facility]  Tobacco Use: High Risk (05/20/2023)   SDOH Interventions: Social Connections Interventions: Patient Declined   Readmission Risk Interventions    05/23/2023    1:15 PM   Readmission Risk Prevention Plan  Transportation Screening Complete  PCP or Specialist Appt within 5-7 Days Complete  Home Care Screening Complete  Medication Review (RN CM) Complete

## 2023-05-24 DIAGNOSIS — Z8781 Personal history of (healed) traumatic fracture: Secondary | ICD-10-CM | POA: Diagnosis not present

## 2023-05-24 DIAGNOSIS — W010XXA Fall on same level from slipping, tripping and stumbling without subsequent striking against object, initial encounter: Secondary | ICD-10-CM | POA: Diagnosis not present

## 2023-05-24 DIAGNOSIS — S72002A Fracture of unspecified part of neck of left femur, initial encounter for closed fracture: Secondary | ICD-10-CM | POA: Diagnosis not present

## 2023-05-24 DIAGNOSIS — J410 Simple chronic bronchitis: Secondary | ICD-10-CM | POA: Diagnosis not present

## 2023-05-24 LAB — BASIC METABOLIC PANEL
Anion gap: 9 (ref 5–15)
BUN: 9 mg/dL (ref 8–23)
CO2: 21 mmol/L — ABNORMAL LOW (ref 22–32)
Calcium: 8.6 mg/dL — ABNORMAL LOW (ref 8.9–10.3)
Chloride: 102 mmol/L (ref 98–111)
Creatinine, Ser: 0.63 mg/dL (ref 0.44–1.00)
GFR, Estimated: >60 mL/min (ref >60)
Glucose, Bld: 101 mg/dL — ABNORMAL HIGH (ref 70–99)
Potassium: 3.5 mmol/L (ref 3.5–5.1)
Sodium: 132 mmol/L — ABNORMAL LOW (ref 135–145)

## 2023-05-24 LAB — CBC
HCT: 29.5 % — ABNORMAL LOW (ref 36.0–46.0)
Hemoglobin: 9.1 g/dL — ABNORMAL LOW (ref 12.0–15.0)
MCH: 25.9 pg — ABNORMAL LOW (ref 26.0–34.0)
MCHC: 30.8 g/dL (ref 30.0–36.0)
MCV: 83.8 fL (ref 80.0–100.0)
Platelets: 248 10*3/uL (ref 150–400)
RBC: 3.52 MIL/uL — ABNORMAL LOW (ref 3.87–5.11)
RDW: 14.4 % (ref 11.5–15.5)
WBC: 10.7 10*3/uL — ABNORMAL HIGH (ref 4.0–10.5)
nRBC: 0 % (ref 0.0–0.2)

## 2023-05-24 MED ORDER — GABAPENTIN 300 MG PO CAPS: 300.0000 mg | ORAL_CAPSULE | Freq: Two times a day (BID) | ORAL | Status: DC

## 2023-05-24 MED ORDER — VITAMIN D (ERGOCALCIFEROL) 1.25 MG (50000 UNIT) PO CAPS: 50000.0000 [IU] | ORAL_CAPSULE | ORAL | Status: DC

## 2023-05-24 MED ORDER — MELATONIN 5 MG PO TABS: 5.0000 mg | ORAL_TABLET | Freq: Every evening | ORAL | Status: DC | PRN

## 2023-05-24 MED ORDER — ALPRAZOLAM 0.5 MG PO TABS: 0.5000 mg | ORAL_TABLET | Freq: Two times a day (BID) | ORAL | 0 refills | Status: DC | PRN

## 2023-05-24 MED ORDER — OXYCODONE HCL 10 MG PO TABS: 5.0000 mg | ORAL_TABLET | Freq: Four times a day (QID) | ORAL | 0 refills | Status: DC | PRN

## 2023-05-24 MED ORDER — ATENOLOL 50 MG PO TABS: 50.0000 mg | ORAL_TABLET | Freq: Every day | ORAL | Status: DC

## 2023-05-24 MED ORDER — ASPIRIN 325 MG PO TABS: 325.0000 mg | ORAL_TABLET | Freq: Every day | ORAL | Status: DC

## 2023-05-24 MED ORDER — POLYETHYLENE GLYCOL 3350 17 G PO PACK: 17.0000 g | PACK | Freq: Every day | ORAL | Status: DC | PRN

## 2023-05-24 MED ORDER — POLYETHYLENE GLYCOL 3350 17 G PO PACK: 17.0000 g | PACK | Freq: Every day | ORAL | Status: AC | PRN

## 2023-05-24 MED ORDER — ACETAMINOPHEN 325 MG PO TABS: 650.0000 mg | ORAL_TABLET | Freq: Four times a day (QID) | ORAL | Status: DC | PRN

## 2023-05-24 NOTE — Plan of Care (Signed)
  Problem: Education: Goal: Knowledge of General Education information will improve Description: Including pain rating scale, medication(s)/side effects and non-pharmacologic comfort measures Outcome: Adequate for Discharge   Problem: Health Behavior/Discharge Planning: Goal: Ability to manage health-related needs will improve Outcome: Adequate for Discharge   Problem: Clinical Measurements: Goal: Ability to maintain clinical measurements within normal limits will improve Outcome: Completed/Met Goal: Will remain free from infection Outcome: Completed/Met Goal: Diagnostic test results will improve Outcome: Adequate for Discharge Goal: Respiratory complications will improve Outcome: Completed/Met Goal: Cardiovascular complication will be avoided Outcome: Completed/Met   Problem: Activity: Goal: Risk for activity intolerance will decrease Outcome: Adequate for Discharge   Problem: Coping: Goal: Level of anxiety will decrease Outcome: Adequate for Discharge   Problem: Elimination: Goal: Will not experience complications related to bowel motility Outcome: Completed/Met   Problem: Pain Management: Goal: General experience of comfort will improve Outcome: Adequate for Discharge   Problem: Safety: Goal: Ability to remain free from injury will improve Outcome: Progressing   Problem: Skin Integrity: Goal: Risk for impaired skin integrity will decrease Outcome: Completed/Met   Problem: Education: Goal: Knowledge of General Education information will improve Description: Including pain rating scale, medication(s)/side effects and non-pharmacologic comfort measures Outcome: Completed/Met   Problem: Health Behavior/Discharge Planning: Goal: Ability to manage health-related needs will improve Outcome: Adequate for Discharge   Problem: Education: Goal: Verbalization of understanding the information provided (i.e., activity precautions, restrictions, etc) will improve Outcome:  Completed/Met Goal: Individualized Educational Video(s) Outcome: Completed/Met   Problem: Activity: Goal: Ability to ambulate and perform ADLs will improve Outcome: Completed/Met   Problem: Pain Management: Goal: Pain level will decrease Outcome: Adequate for Discharge

## 2023-05-24 NOTE — Discharge Summary (Signed)
 Physician Discharge Summary   Patient: [name] MRN: [id number] DOB: 01/28/1956  Admit date:     05/20/2023  Discharge date: 05/24/23  Discharge Physician: Nydia Distance, MD    PCP: Leonce Carola PARAS, PA-C   Recommendations at discharge:   Weight bearing as tolerated with PT OT PT recommended SNF Continue aspirin  325 mg for 30 days for DVT prophylaxis, outpatient follow-up with Dr. Genelle in 2 weeks.  Discharge Diagnoses:  Mechanical fall Acute displaced proximal left femoral metaphyseal fracture Vitamin D  deficiency Postoperative acute blood loss anemia Rheumatoid arthritis   COPD (chronic obstructive pulmonary disease) (HCC) History of breast cancer status postmastectomy    Hospital Course:  Patient is a 68 year old female with COPD, stage IIa breast cancer s/p mastectomy in 2016.  On letrozole , RA, severe OSA, recent fall leading to left hip fracture s/p pinning and cannulated screw on 11/5 brought to ED by EMS after she had accidental fall, and admitted with acute proximal left femoral metaphyseal fracture involving the inferior portion of the trochanters with mild overriding and lateral displacement, and acute urinary retention with > 900 cc urine output after Foley insertion.   Orthopedic surgery consulted.  Patient underwent left hip cephalomedullary nailing and removal of previous left hip deprescribed on the hardware by Dr. Genelle on 05/21/2023.   Assessment and Plan:    Mechanical fall at home Acute displaced proximal left femoral metaphyseal fracture-vitamin D  21.47 Recent left hip fracture s/p pinning at [facility] on 03/25/2023 -S/p left hip cephalomedullary nailing and removal of previous left hip deprescribed on the hardware by Dr. Genelle  -Continue pain control, PT OT, WBAT -Per orthopedics, aspirin  325 mg daily for DVT prophylaxis -PT evaluation recommended SNF -Outpatient follow-up with Dr. Genelle in 2 weeks   Expected postoperative acute blood loss  anemia:  -H&H stable, 9.1     Chronic COPD:   -No respiratory issue.  No wheezing.   Rheumatoid arthritis:  -Does not seem to be on DMARDs.  Takes prednisone  which seems to be prescribed by oncology -Continue home prednisone    History of breast cancer s/p mastectomy on letrozole  -Continue home letrozole  2.5 mg daily. -Outpatient follow-up with Dr. Gudena   Chronic anxiety/depression: Stable -Continue Zoloft  and Ativan    GERD -Continue PPI     Peripheral neuropathy -Continue home gabapentin    Acute urinary retention  - with greater than 900 cc urine retained, POA, - Foley catheter placed on admission, DC'd on 1/2, voiding successfully     Hypokalemia -Replace as needed   Vitamin D  deficiency -Vitamin D  21.47 -Placed on vitamin D  50,000 units q. Weekly until level above 30, then continue 2000 units daily   Hyponatremia: Mild -Continue monitoring   Constipation -Resolved, continue MiraLAX  daily as needed for constipation     Obesity Estimated body mass index is 32.76 kg/m as calculated from the following:   Height as of this encounter: 5' 2 (1.575 m).   Weight as of 03/21/23: 81.2 kg.       Pain control - [place]  Controlled Substance Reporting System database was reviewed. and patient was instructed, not to drive, operate heavy machinery, perform activities at heights, swimming or participation in water  activities or provide baby-sitting services while on Pain, Sleep and Anxiety Medications; until their outpatient Physician has advised to do so again. Also recommended to not to take more than prescribed Pain, Sleep and Anxiety Medications.  Consultants: Orthopedics Procedures performed: S/p left hip cephalomedullary nailing   Disposition: Skilled nursing facility Diet recommendation:  Discharge Diet Orders (From admission, onward)     Start     Ordered   05/24/23 0000  Diet - low sodium heart healthy        05/24/23 0837            DISCHARGE  MEDICATION: Allergies as of 05/24/2023       Reactions   Pegfilgrastim  Hives   Blistering rash all over body   Codeine Itching, Nausea Only   Pt has had codeine with pre-administration to prevent reaction without any problem. Other Reaction(s): nausea and vomiting, itching        Medication List     STOP taking these medications    gabapentin  600 MG tablet Commonly known as: NEURONTIN  Replaced by: gabapentin  300 MG capsule   oxyCODONE -acetaminophen  5-325 MG tablet Commonly known as: PERCOCET/ROXICET       TAKE these medications    acetaminophen  325 MG tablet Commonly known as: TYLENOL  Take 2 tablets (650 mg total) by mouth every 6 (six) hours as needed for mild pain (pain score 1-3), fever or headache.   ALPRAZolam  0.5 MG tablet Commonly known as: XANAX  Take 1 tablet (0.5 mg total) by mouth 2 (two) times daily as needed for anxiety.   aspirin  325 MG tablet Take 1 tablet (325 mg total) by mouth daily.   atenolol  50 MG tablet Commonly known as: TENORMIN  Take 1 tablet (50 mg total) by mouth daily. TAKE 1 TABLET(50 MG) BY MOUTH TWICE DAILY What changed:  how much to take how to take this when to take this   B-complex with vitamin C tablet Take 1 tablet by mouth 2 (two) times a week.   famotidine  40 MG tablet Commonly known as: PEPCID  Take 40 mg by mouth at bedtime.   fluticasone  50 MCG/ACT nasal spray Commonly known as: FLONASE  Place 2 sprays into both nostrils daily.   gabapentin  300 MG capsule Commonly known as: NEURONTIN  Take 1 capsule (300 mg total) by mouth 2 (two) times daily. Replaces: gabapentin  600 MG tablet   letrozole  2.5 MG tablet Commonly known as: FEMARA  Take 2.5 mg by mouth daily.   melatonin 5 MG Tabs Take 1 tablet (5 mg total) by mouth at bedtime as needed.   Multiple Vitamins tablet Take by mouth.   ondansetron  8 MG tablet Commonly known as: ZOFRAN  Take 1 tablet (8 mg total) by mouth every 8 (eight) hours as needed for nausea or  vomiting.   Oxycodone  HCl 10 MG Tabs Take 0.5-1 tablets (5-10 mg total) by mouth every 6 (six) hours as needed for moderate pain (pain score 4-6) or severe pain (pain score 7-10).   pantoprazole  40 MG tablet Commonly known as: PROTONIX  Take 40 mg by mouth 2 (two) times daily.   polyethylene glycol 17 g packet Commonly known as: MIRALAX  / GLYCOLAX  Take 17 g by mouth daily as needed for moderate constipation.   predniSONE  10 MG tablet Commonly known as: DELTASONE  TAKE 1 TABLET(10 MG) BY MOUTH DAILY WITH BREAKFAST   QC TUMERIC COMPLEX PO Take 1 tablet by mouth daily.   sertraline  50 MG tablet Commonly known as: ZOLOFT  Take 25 mg by mouth at bedtime.   Stiolto Respimat  2.5-2.5 MCG/ACT Aers Generic drug: Tiotropium Bromide-Olodaterol Inhale 2 puffs into the lungs daily.   Tumersaid Tabs as directed Orally   Vitamin D  (Ergocalciferol ) 1.25 MG (50000 UNIT) Caps capsule Commonly known as: DRISDOL  Take 1 capsule (50,000 Units total) by mouth every 7 (seven) days. Start taking on: May 30, 2023  WOMENS MULTIVITAMIN PO Take 1 tablet by mouth 3 (three) times a week.        Follow-up Information     Genelle Standing, MD. Schedule an appointment as soon as possible for a visit in 2 week(s).   Specialty: Orthopedic Surgery Why: for hospital follow-up Contact information: [address] [place] [address] [phone]         Leonce Carola PARAS, PA-C. Schedule an appointment as soon as possible for a visit in 2 week(s).   Specialty: Pediatrics Why: for hospital follow-up Contact information: [address] [place] [address] [phone]                Discharge Exam: S: No acute complaints, looking forward to discharge to rehab  BP 126/74 (BP Location: Left Arm)   Pulse 76   Temp 98.8 F (37.1 C)   Resp 17   Ht 5' 2 (1.575 m)   SpO2 96%   BMI 32.76 kg/m   Physical Exam General: Alert and oriented x 3,  NAD Cardiovascular: S1 S2 clear, RRR.  Respiratory: CTAB, no wheezing, rales or rhonchi Gastrointestinal: Soft, nontender, nondistended, NBS Ext: no pedal edema bilaterally Neuro: no new deficits Psych: Normal affect    Condition at discharge: fair  The results of significant diagnostics from this hospitalization (including imaging, microbiology, ancillary and laboratory) are listed below for reference.   Imaging Studies: DG FEMUR MIN 2 VIEWS LEFT Result Date: 05/21/2023 CLINICAL DATA:  Elective surgery. EXAM: LEFT FEMUR 2 VIEWS COMPARISON:  Preoperative imaging FINDINGS: Eleven fluoroscopic spot views of the left femur obtained in the operating room. Femoral intramedullary nail with trans trochanteric and distal locking screw fixation traverse proximal femur fracture, the previous femoral hardware is removed. Fluoroscopy time 58 seconds. Dose 24.44 mGy. IMPRESSION: Intraoperative fluoroscopy during proximal femur fracture fixation. Electronically Signed   By: Andrea Gasman M.D.   On: 05/21/2023 15:19   DG C-Arm 1-60 Min-No Report Result Date: 05/21/2023 Fluoroscopy was utilized by the requesting physician.  No radiographic interpretation.   DG C-Arm 1-60 Min-No Report Result Date: 05/21/2023 Fluoroscopy was utilized by the requesting physician.  No radiographic interpretation.   DG Chest 1 View Result Date: 05/20/2023 CLINICAL DATA:  Fall with left hip fracture, breast cancer EXAM: CHEST  1 VIEW COMPARISON:  11/29/2022 chest radiograph. FINDINGS: Left subclavian Port-A-Cath terminates in the middle third of the SVC. Stable cardiomediastinal silhouette with top-normal heart size. No pneumothorax. No pleural effusion. Lungs appear clear, with no acute consolidative airspace disease and no pulmonary edema. Surgical clips overlie the right axilla. IMPRESSION: No active cardiopulmonary disease. Electronically Signed   By: Selinda DELENA Blue M.D.   On: 05/20/2023 22:20   DG HIP UNILAT W OR W/O PELVIS  2-3 VIEWS LEFT Result Date: 05/20/2023 CLINICAL DATA:  Fall onto left hip today, recent left hip ORIF 03/25/2023 EXAM: DG HIP (WITH OR WITHOUT PELVIS) 2-3V LEFT COMPARISON:  None Available. FINDINGS: Acute proximal left femoral metaphysis fracture involving the inferior portions of the trochanters with mild over riding and 1 cm lateral displacement of the distal fracture fragment. Three intact fixation pins in the left femoral neck. No hip dislocation. Right total hip arthroplasty without hardware complication. No pelvic diastasis. No focal osseous lesions. Mild lower lumbar degenerative changes. IMPRESSION: Acute proximal left femoral metaphysis fracture involving the inferior portions of the trochanters with mild over-riding and 1 cm lateral displacement of the distal fracture fragment. Three intact fixation pins in the left  femoral neck. Right total hip arthroplasty without complication. Electronically Signed   By: Selinda DELENA Blue M.D.   On: 05/20/2023 22:19    Microbiology: Results for orders placed or performed during the hospital encounter of 05/20/23  Surgical pcr screen     Status: None   Collection Time: 05/21/23 11:44 AM   Specimen: Nasal Mucosa; Nasal Swab  Result Value Ref Range Status   MRSA, PCR NEGATIVE NEGATIVE Final   Staphylococcus aureus NEGATIVE NEGATIVE Final    Comment: (NOTE) The Xpert SA Assay (FDA approved for NASAL specimens in patients 61 years of age and older), is one component of a comprehensive surveillance program. It is not intended to diagnose infection nor to guide or monitor treatment. Performed at [facility], [address], [place], [place] [address]     Labs: CBC: Recent Labs  Lab 05/20/23 2101 05/21/23 0513 05/22/23 0338 05/23/23 0314 05/24/23 0340  WBC 12.2* 12.0* 12.1* 10.8* 10.7*  HGB 11.5* 11.8* 10.0* 9.1* 9.1*  HCT 35.1* 37.5 31.4* 28.3* 29.5*  MCV 81.4 82.6 83.5 82.7 83.8  PLT 287 269 259 240 248   Basic Metabolic  Panel: Recent Labs  Lab 05/20/23 2101 05/21/23 0513 05/22/23 0338 05/23/23 0314 05/24/23 0340  NA 133* 135 133* 132* 132*  K 3.5 3.4* 4.6 3.6 3.5  CL 103 104 106 100 102  CO2 18* 22 18* 22 21*  GLUCOSE 92 117* 112* 102* 101*  BUN 15 14 14 12 9   CREATININE 1.02* 0.98 0.82 0.61 0.63  CALCIUM  8.6* 8.9 8.6* 8.8* 8.6*  MG  --  1.8 1.9 1.7  --   PHOS  --  4.0  --  3.7  --    Liver Function Tests: Recent Labs  Lab 05/23/23 0314  ALBUMIN 3.3*   CBG: No results for input(s): GLUCAP in the last 168 hours.  Discharge time spent: greater than 30 minutes.  Signed: Nydia Distance, MD [facility] 05/24/2023

## 2023-05-24 NOTE — Progress Notes (Signed)
 Physical Therapy Treatment Patient Details Name: [name] MRN: [id number] DOB: 10/28/1955 Today's Date: 05/24/2023   History of Present Illness 68 year old female brought to ED by EMS after she had accidental fall, and admitted with acute proximal left femoral metaphyseal fracture involving the inferior portion of the trochanters with mild overriding and lateral displacement, and acute urinary retention. orthopedics consulted. pt underwent left hip cephalomedullary nailing and removal of previous hardware by Dr. Genelle on 05/21/2023.   PMH: COPD, stage IIa breast cancer s/p mastectomy in 2016, RA, severe OSA, recent fall leading to left hip fracture s/p pinning and cannulated screw on 11/5    PT Comments  Pt progressing steadily. Incr amb distance/tolerance to activity this session. Pain controlled with activity. Plan is for post acute rehab at [facility], pt for possible d/c today. Continue PT in acute setting.    If plan is discharge home, recommend the following: A little help with walking and/or transfers;A little help with bathing/dressing/bathroom;Help with stairs or ramp for entrance;Assistance with cooking/housework;Assist for transportation   Can travel by private vehicle     Yes  Equipment Recommendations  None recommended by PT    Recommendations for Other Services       Precautions / Restrictions Precautions Precautions: Fall Restrictions Weight Bearing Restrictions Per Provider Order: No Other Position/Activity Restrictions: WBAT     Mobility  Bed Mobility               General bed mobility comments: received in recliner    Transfers Overall transfer level: Needs assistance Equipment used: Rolling walker (2 wheels) Transfers: Sit to/from Stand, Bed to chair/wheelchair/BSC Sit to Stand: Contact guard assist           General transfer comment: verbal cues for hand placement and LE position. CGA for safety to rise and transition to RW     Ambulation/Gait Ambulation/Gait assistance: Contact guard assist, Min assist Gait Distance (Feet): 50 Feet Assistive device: Rolling walker (2 wheels) Gait Pattern/deviations: Step-to pattern, Decreased step length - right, Decreased step length - left, Trunk flexed Gait velocity: decr     General Gait Details: multi-modal cues for sequence, RW position, trunk extension and overall safety   Stairs             Wheelchair Mobility     Tilt Bed    Modified Rankin (Stroke Patients Only)       Balance Overall balance assessment: Needs assistance, History of Falls Sitting-balance support: No upper extremity supported, Feet supported Sitting balance-Leahy Scale: Good     Standing balance support: During functional activity, Reliant on assistive device for balance, Bilateral upper extremity supported Standing balance-Leahy Scale: Poor                              Cognition Arousal: Alert Behavior During Therapy: WFL for tasks assessed/performed Overall Cognitive Status: Within Functional Limits for tasks assessed                                          Exercises General Exercises - Lower Extremity Ankle Circles/Pumps: AROM, Both, 5 reps Heel Slides: AAROM, Left, 10 reps Hip ABduction/ADduction: AAROM, Left, 10 reps    General Comments        Pertinent Vitals/Pain Pain Assessment Pain Assessment: 0-10 Pain Score: 3  Pain Location: L hip Pain Descriptors /  Indicators: Aching, Sore Pain Intervention(s): Limited activity within patient's tolerance, Monitored during session, Premedicated before session, Repositioned    Home Living                          Prior Function            PT Goals (current goals can now be found in the care plan section) Acute Rehab PT Goals Patient Stated Goal: be able to take care of pets PT Goal Formulation: With patient Time For Goal Achievement: 06/05/23 Potential to Achieve  Goals: Good Progress towards PT goals: Progressing toward goals    Frequency    Min 1X/week      PT Plan      Co-evaluation              AM-PAC PT 6 Clicks Mobility   Outcome Measure  Help needed turning from your back to your side while in a flat bed without using bedrails?: A Little Help needed moving from lying on your back to sitting on the side of a flat bed without using bedrails?: A Little Help needed moving to and from a bed to a chair (including a wheelchair)?: A Little Help needed standing up from a chair using your arms (e.g., wheelchair or bedside chair)?: A Little Help needed to walk in hospital room?: A Little Help needed climbing 3-5 steps with a railing? : A Lot 6 Click Score: 17    End of Session Equipment Utilized During Treatment: Gait belt Activity Tolerance: Patient tolerated treatment well Patient left: with call bell/phone within reach;Other (comment);with chair alarm set;in chair Nurse Communication: Mobility status PT Visit Diagnosis: Other abnormalities of gait and mobility (R26.89)     Time: 8874-8852 PT Time Calculation (min) (ACUTE ONLY): 22 min  Charges:    $Gait Training: 8-22 mins PT General Charges $$ ACUTE PT VISIT: 1 Visit                     Jakiah Bienaime, PT  Acute Rehab Dept [facility]) [phone]  05/24/2023    [facility] 05/24/2023, 12:21 PM

## 2023-05-24 NOTE — TOC Transition Note (Addendum)
 Transition of Care [facility]) - Discharge Note   Patient Details  Name: [name] MRN: [id number] Date of Birth: May 20, 1956  Transition of [facility]) CM/SW Contact:  Dalila Camellia SAUNDERS, LCSW Phone Number: 05/24/2023, 10:59 AM   Clinical Narrative:     Patient to be d/c'ed today to [facility] room 609B.  Patient and family agreeable to plans will transport via ems RN to call report [phone].    Final next level of care: Skilled Nursing Facility Barriers to Discharge: Barriers Resolved   Patient Goals and CMS Choice Patient states their goals for this hospitalization and ongoing recovery are:: To go to SNF for rehab, then return home. CMS Medicare.gov Compare Post Acute Care list provided to:: Patient Represenative (must comment) Choice offered to / list presented to : Adult Children [place] ownership interest in [facility].provided to:: Adult Children    Discharge Placement [facility] SNF, room [id number].    Existing PASRR number confirmed : 05/23/23          Patient chooses bed at: [facility] Patient to be transferred to facility by: [facility] Name of family member notified: Paitent's son Vinie Patient and family notified of of transfer: 05/24/23  Discharge Plan and Services Additional resources added to the After Visit Summary for   In-house Referral: Clinical Social Work   Post Acute Care Choice: Skilled Nursing Facility          DME Arranged: N/A DME Agency: NA                  Social Drivers of Health (SDOH) Interventions SDOH Screenings   Food Insecurity: Patient Declined (05/22/2023)  Housing: Patient Declined (05/22/2023)  Transportation Needs: Patient Declined (05/22/2023)  Recent Concern: Transportation Needs - Unmet Transportation Needs (03/25/2023)   Received from [facility]  Utilities: Patient Declined (05/22/2023)  Depression (PHQ2-9): Low Risk  (03/24/2020)  Financial Resource Strain: Medium Risk (01/06/2023)    Received from [facility]  Physical Activity: Insufficiently Active (01/06/2023)   Received from [facility]  Social Connections: Patient Declined (05/22/2023)  Stress: No Stress Concern Present (03/25/2023)   Received from [facility]  Recent Concern: Stress - Stress Concern Present (01/06/2023)   Received from [facility]  Tobacco Use: High Risk (05/20/2023)     Readmission Risk Interventions    05/23/2023    1:15 PM  Readmission Risk Prevention Plan  Transportation Screening Complete  PCP or Specialist Appt within 5-7 Days Complete  Home Care Screening Complete  Medication Review (RN CM) Complete

## 2023-05-27 ENCOUNTER — Telehealth (HOSPITAL_BASED_OUTPATIENT_CLINIC_OR_DEPARTMENT_OTHER): Payer: Self-pay | Admitting: Orthopaedic Surgery

## 2023-05-27 ENCOUNTER — Telehealth: Payer: Self-pay | Admitting: Orthopaedic Surgery

## 2023-05-27 NOTE — Telephone Encounter (Signed)
 Call patient to sch a PO appointment with DR B left a message for patient to call the office and get sch

## 2023-05-27 NOTE — Telephone Encounter (Signed)
 Pt does not have any information about Post op follow up, no notes about return in Operative notes please advise pt is currently at [facility]

## 2023-05-29 DIAGNOSIS — S7292XF Unspecified fracture of left femur, subsequent encounter for open fracture type IIIA, IIIB, or IIIC with routine healing: Secondary | ICD-10-CM | POA: Diagnosis not present

## 2023-05-29 DIAGNOSIS — M069 Rheumatoid arthritis, unspecified: Secondary | ICD-10-CM | POA: Diagnosis not present

## 2023-05-29 DIAGNOSIS — J449 Chronic obstructive pulmonary disease, unspecified: Secondary | ICD-10-CM | POA: Diagnosis not present

## 2023-06-08 ENCOUNTER — Other Ambulatory Visit: Payer: Self-pay | Admitting: Internal Medicine

## 2023-06-08 MED ORDER — OXYCODONE HCL 10 MG PO TABS: 10.0000 mg | ORAL_TABLET | Freq: Four times a day (QID) | ORAL | 0 refills | Status: AC | PRN

## 2023-06-08 MED ORDER — OXYCODONE HCL 10 MG PO TABS: 10.0000 mg | ORAL_TABLET | Freq: Four times a day (QID) | ORAL | 0 refills | Status: DC | PRN

## 2023-06-09 ENCOUNTER — Ambulatory Visit (HOSPITAL_BASED_OUTPATIENT_CLINIC_OR_DEPARTMENT_OTHER): Payer: Medicare HMO

## 2023-06-09 ENCOUNTER — Other Ambulatory Visit (HOSPITAL_BASED_OUTPATIENT_CLINIC_OR_DEPARTMENT_OTHER): Payer: Self-pay | Admitting: Student

## 2023-06-09 ENCOUNTER — Ambulatory Visit (INDEPENDENT_AMBULATORY_CARE_PROVIDER_SITE_OTHER): Payer: Medicare HMO | Admitting: Student

## 2023-06-09 ENCOUNTER — Encounter (HOSPITAL_BASED_OUTPATIENT_CLINIC_OR_DEPARTMENT_OTHER): Payer: Self-pay | Admitting: Student

## 2023-06-09 DIAGNOSIS — M25552 Pain in left hip: Secondary | ICD-10-CM | POA: Diagnosis not present

## 2023-06-09 DIAGNOSIS — S7222XA Displaced subtrochanteric fracture of left femur, initial encounter for closed fracture: Secondary | ICD-10-CM

## 2023-06-09 NOTE — Progress Notes (Unsigned)
Post Operative Evaluation    Procedure/Date of Surgery: Left hip cephalomedullary IM nail with hardware removal 05/21/2023  Interval History:   Patient presents today 2 weeks status post the above procedure due to a left subtrochanteric fracture.  Overall she reports doing well and has been able to weight-bear some with use of a walker.  Has been taking occasional oxycodone to help with pain.  No concerns regarding incisions and denies any fever or chills.  PMH/PSH/Family History/Social History/Meds/Allergies:    Past Medical History:  Diagnosis Date   Allergy    Phreesia 03/21/2020   Anemia    Phreesia 03/21/2020   Anxiety    Arthritis    Phreesia 03/21/2020   Blood transfusion without reported diagnosis    Phreesia 03/21/2020   Cancer (HCC)    right breast cancer   Depression    Depression    Phreesia 03/21/2020   Fibromyalgia    GERD (gastroesophageal reflux disease)    Phreesia 03/21/2020   Heavy smoker    Hepatitis C    Pt was given too much interferon - critical care x2d, 5 days total hosp   Neuromuscular disorder (HCC)    Phreesia 03/21/2020   Osteoporosis    Perimenopausal 11/18/2003   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), hypoglycemia 2/04-resolved with PO calcium + D, SVT, was treated with iterferon and ribavirin-10/3-2/04   Personal history of chemotherapy    PUD (peptic ulcer disease)    Rheumatoid arthritis(714.0)    possible   SVT (supraventricular tachycardia) (HCC)    Wears glasses    Past Surgical History:  Procedure Laterality Date   ABDOMINAL HYSTERECTOMY     BREAST SURGERY N/A    Phreesia 03/21/2020   CESAREAN SECTION  09/17/1980   CESAREAN SECTION N/A    Phreesia 03/21/2020   COLONOSCOPY     DILATION AND CURETTAGE OF UTERUS     hysterectomy-for dysmenorrhea  09/17/2000   INTRAMEDULLARY (IM) NAIL INTERTROCHANTERIC Left 05/21/2023   Procedure:  INTRAMEDULLARY (IM) NAIL INTERTROCHANTERIC WITH HARDWARE REMOVAL;  Surgeon: Huel Cote, MD;  Location: [facility];  Service: Orthopedics;  Laterality: Left;   JOINT REPLACEMENT N/A    Phreesia 03/21/2020   MASTECTOMY Right 2016   MASTECTOMY W/ SENTINEL NODE BIOPSY Right 07/28/2014   Procedure: RIGHT MASTECTOMY WITH SENTINEL LYMPH NODE BIOPSY ;  Surgeon: Almond Lint, MD;  Location: [facility];  Service: General;  Laterality: Right;   NASAL SINUS SURGERY  03/20/1984   for deviated septum   PORTACATH PLACEMENT Left 01/20/2014   Procedure: INSERTION PORT-A-CATH;  Surgeon: Almond Lint, MD;  Location: [facility];  Service: General;  Laterality: Left;   TONSILLECTOMY  05/20/1961   UPPER GI ENDOSCOPY     Social History   Socioeconomic History   Marital status: Divorced    Spouse name: Not on file   Number of children: Not on file   Years of education: Not on file   Highest education level: Not on file  Occupational History   Occupation: disability  Tobacco Use   Smoking status: Every Day   Smokeless tobacco: Never   Tobacco comments:    Pt is currently vapping ARJ 12/06/22  Vaping Use   Vaping status: Every Day  Substance and Sexual Activity   Alcohol  use: Not Currently    Comment: denies; reports 3-4 beers/night to Dr. Normand Sloop with 6 pack on weekend; hx of 12 pack/day EtOH abuse in 93    Drug use: No    Comment: had perscription drug abuse problem in 1993; previous IVDU/inranasal cocaine in her 20s-contracted   Sexual activity: Not Currently    Birth control/protection: Surgical  Other Topics Concern   Not on file  Social History Narrative   Divorced, lives with son [name] (born 18); parents nearby. 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    Hepatitis B & C   Has 2 dogs and 5 cats   Did inpatient substance abuse rehab in 1993         Social Drivers of Health   Financial Resource Strain: Medium Risk  (01/06/2023)   Received from [facility]   Overall Financial Resource Strain (CARDIA)    Difficulty of Paying Living Expenses: Somewhat hard  Food Insecurity: Patient Declined (05/22/2023)   Hunger Vital Sign    Worried About Running Out of Food in the Last Year: Patient declined    Ran Out of Food in the Last Year: Patient declined  Transportation Needs: Patient Declined (05/22/2023)   PRAPARE - Administrator, Civil Service (Medical): Patient declined    Lack of Transportation (Non-Medical): Patient declined  Recent Concern: Transportation Needs - Unmet Transportation Needs (03/25/2023)   Received from [facility] - Transportation    Lack of Transportation (Medical): Yes    Lack of Transportation (Non-Medical): Yes  Physical Activity: Insufficiently Active (01/06/2023)   Received from [facility]   Exercise Vital Sign    Days of Exercise per Week: 1 day    Minutes of Exercise per Session: 20 min  Stress: No Stress Concern Present (03/25/2023)   Received from [facility]ational [facility] - Occupational Stress Questionnaire    Feeling of Stress : Only a little  Recent Concern: Stress - Stress Concern Present (01/06/2023)   Received from [facility] of Occupational Health - Occupational Stress Questionnaire    Feeling of Stress : Rather much  Social Connections: Patient Declined (05/22/2023)   Social Connection and Isolation Panel [NHANES]    Frequency of Communication with Friends and Family: Patient declined    Frequency of Social Gatherings with Friends and Family: Patient declined    Attends Religious Services: Patient declined    Database administrator or Organizations: Patient declined    Attends Engineer, structural: Patient declined    Marital Status: Patient declined   Family History  Problem Relation Age of Onset   Dementia Mother    Hypertension Mother    Hyperlipidemia Mother    Cancer  Father 69       lung   Dementia Father    Breast cancer Maternal Aunt    Cancer Paternal Uncle    Breast cancer Cousin    Colon cancer Neg Hx    Allergies  Allergen Reactions   Pegfilgrastim Hives    Blistering rash all over body   Codeine Itching and Nausea Only    Pt has had codeine with pre-administration to prevent reaction without any problem.  Other Reaction(s): nausea and vomiting, itching   Current Outpatient Medications  Medication Sig Dispense Refill   acetaminophen (TYLENOL) 325 MG tablet Take 2 tablets (650 mg total) by mouth every 6 (six) hours as  needed for mild pain (pain score 1-3), fever or headache.     ALPRAZolam (XANAX) 0.5 MG tablet Take 1 tablet (0.5 mg total) by mouth 2 (two) times daily as needed for anxiety. 10 tablet 0   aspirin 325 MG tablet Take 1 tablet (325 mg total) by mouth daily.     atenolol (TENORMIN) 50 MG tablet Take 1 tablet (50 mg total) by mouth daily. TAKE 1 TABLET(50 MG) BY MOUTH TWICE DAILY     B Complex-C (B-COMPLEX WITH VITAMIN C) tablet Take 1 tablet by mouth 2 (two) times a week.     famotidine (PEPCID) 40 MG tablet Take 40 mg by mouth at bedtime.     fluticasone (FLONASE) 50 MCG/ACT nasal spray Place 2 sprays into both nostrils daily. 16 g 6   gabapentin (NEURONTIN) 300 MG capsule Take 1 capsule (300 mg total) by mouth 2 (two) times daily.     letrozole (FEMARA) 2.5 MG tablet Take 2.5 mg by mouth daily.     melatonin 5 MG TABS Take 1 tablet (5 mg total) by mouth at bedtime as needed.     Misc Natural Products (TUMERSAID) TABS as directed Orally     Multiple Vitamins tablet Take by mouth.     Multiple Vitamins-Minerals (WOMENS MULTIVITAMIN PO) Take 1 tablet by mouth 3 (three) times a week.     ondansetron (ZOFRAN) 8 MG tablet Take 1 tablet (8 mg total) by mouth every 8 (eight) hours as needed for nausea or vomiting. (Patient not taking: Reported on 03/20/2023) 30 tablet 3   Oxycodone HCl 10 MG TABS Take 1 tablet (10 mg total) by mouth  every 6 (six) hours as needed for up to 14 days. 56 tablet 0   pantoprazole (PROTONIX) 40 MG tablet Take 40 mg by mouth 2 (two) times daily.     polyethylene glycol (MIRALAX / GLYCOLAX) 17 g packet Take 17 g by mouth daily as needed for moderate constipation.     predniSONE (DELTASONE) 10 MG tablet TAKE 1 TABLET(10 MG) BY MOUTH DAILY WITH BREAKFAST 90 tablet 0   sertraline (ZOLOFT) 50 MG tablet Take 25 mg by mouth at bedtime.     Tiotropium Bromide-Olodaterol (STIOLTO RESPIMAT) 2.5-2.5 MCG/ACT AERS Inhale 2 puffs into the lungs daily. (Patient not taking: Reported on 03/20/2023) 1 each 5   Turmeric (QC TUMERIC COMPLEX PO) Take 1 tablet by mouth daily.     Vitamin D, Ergocalciferol, (DRISDOL) 1.25 MG (50000 UNIT) CAPS capsule Take 1 capsule (50,000 Units total) by mouth every 7 (seven) days.     No current facility-administered medications for this visit.   No results found.  Review of Systems:   A ROS was performed including pertinent positives and negatives as documented in the HPI.   Musculoskeletal Exam:    There were no vitals taken for this visit.  Surgical incisions are well-appearing without erythema or drainage.  Wound edges remained well-approximated after staple removal.  Distal neurosensory exam intact.  Imaging:   Xray (left femur 2 views): Intramedullary nail in good positioning with improved alignment of subtrochanteric fracture.   I personally reviewed and interpreted the radiographs.   Assessment:   2 weeks status post IM nailing of the left femur due to a periprosthetic subtrochanteric fracture.  Surgical hardware is well-appearing well-positioned.  Staples were able to be removed today from the incisions and no concerns for infection.  At this point she can continue with weightbearing as tolerated and we will plan to follow-up in  another 4 weeks.  I also discussed that because her osteoporosis is not being managed I would like to send her to [facility] for discussion  of treatment plan.  Plan :    -Return to clinic in 4 weeks for next postop visit -Referral to [facility], PA-C for osteoporosis management     I personally saw and evaluated the patient, and participated in the management and treatment plan.  Hazle Nordmann, PA-C Orthopedics

## 2023-06-10 ENCOUNTER — Telehealth: Payer: Self-pay | Admitting: Orthopedic Surgery

## 2023-06-10 NOTE — Telephone Encounter (Signed)
Caron Presume, RN with [facility] called in to Triage requesting verbal orders for Mr. Ehrlich.  They need orders to continue home care for patient, specifically aftercare for post op hip and help with medication management.  Her call back # is [phone].

## 2023-06-11 NOTE — Telephone Encounter (Signed)
Spoke to [employer] for verbal orders

## 2023-06-12 ENCOUNTER — Telehealth: Payer: Self-pay | Admitting: Orthopaedic Surgery

## 2023-06-12 NOTE — Telephone Encounter (Signed)
Tanisha (RN0 from [facility] called requesting orders for nursing 1wk 3.  Tanisha did recert eval to continue nursing. Also Gala Murdoch is requesting new orders for PT. Pt unavailable to start this week therapy tried to see pt this week but pt not able. Tanisha secure number is [phone].

## 2023-06-13 ENCOUNTER — Other Ambulatory Visit (HOSPITAL_BASED_OUTPATIENT_CLINIC_OR_DEPARTMENT_OTHER): Payer: Self-pay | Admitting: Orthopaedic Surgery

## 2023-06-13 ENCOUNTER — Telehealth (HOSPITAL_BASED_OUTPATIENT_CLINIC_OR_DEPARTMENT_OTHER): Payer: Self-pay | Admitting: Orthopaedic Surgery

## 2023-06-13 DIAGNOSIS — S7222XA Displaced subtrochanteric fracture of left femur, initial encounter for closed fracture: Secondary | ICD-10-CM

## 2023-06-13 NOTE — Telephone Encounter (Signed)
Referral placed in chart

## 2023-06-13 NOTE — Telephone Encounter (Signed)
LMOM on secure line with verbal orders from Dr. Steward Drone

## 2023-06-13 NOTE — Telephone Encounter (Signed)
Tenechi [facility] [phone]) (f) [phone]  wants to add on OT for the patient

## 2023-06-17 ENCOUNTER — Ambulatory Visit: Payer: Medicare HMO | Admitting: Physician Assistant

## 2023-06-25 ENCOUNTER — Telehealth: Payer: Self-pay | Admitting: Orthopaedic Surgery

## 2023-06-25 NOTE — Telephone Encounter (Signed)
 [name] with [facility] called. She would like verbal orders for home PT 2wk 6. Her cb# [phone]

## 2023-07-01 ENCOUNTER — Telehealth: Payer: Self-pay | Admitting: *Deleted

## 2023-07-01 NOTE — Telephone Encounter (Signed)
Received call from pt stating she has followed up with Dr. Azucena Fallen with Rheumatology who has recommended she come off of Prednisone.  Pt states Tumeric is helping and would also prefer to wean off.  Per MD pt to take Prednisone 5 mg p.o daily x2 weeks, then transition to 5 mg p.o every other day for 2 weeks and then stop.  Pt educated and verbalized understanding.  Pt states she has plenty of 10 mg on hand that she will split in half.

## 2023-07-08 ENCOUNTER — Encounter: Payer: Self-pay | Admitting: Physician Assistant

## 2023-07-08 ENCOUNTER — Ambulatory Visit (INDEPENDENT_AMBULATORY_CARE_PROVIDER_SITE_OTHER): Payer: Medicare HMO | Admitting: Physician Assistant

## 2023-07-08 VITALS — Ht 63.5 in | Wt 170.4 lb

## 2023-07-08 DIAGNOSIS — M810 Age-related osteoporosis without current pathological fracture: Secondary | ICD-10-CM

## 2023-07-08 NOTE — Progress Notes (Signed)
Office Visit Note   Patient: [name]           Date of Birth: 1955-07-01           MRN: [id number] Visit Date: 07/08/2023              Requested by: [name] Cunas, PA-C [address] [place],  [place] [address] PCP: [name] Mort, PA-C   Assessment & Plan: Visit Diagnoses:  1. Age-related osteoporosis without current pathological fracture     Plan: [name] is a pleasant 68 year old woman who is a patient of Dr. Steward Crawford..  She has a history of left hip fracture x 2.  She does have a history of SVT.  No history of a heart attack in the last year.  She has a history of breast cancer which is in remission.  She does not have kidney disease has not had got gastric bypass.  She is on several medications for severe reflux.  No history of epilepsy or seizures.  She went through menopause at 62 but did not have any hormone replacement therapy.  She does not take calcium or vitamin D.  She does not drink alcohol she was a smoker for 50 years none currently.  She does very little exercise.  She has no history of major dental work.  She does have some RA.  Though fairly well-managed.  Based on her fractures she is osteoporotic but never had a bone density scan.  Currently her vitamin D is borderline and her calcium is low.  We discussed different things for her to try and like her to try and optimize her calcium and vitamin D so she could qualify for some therapeutics.  We talked about exercise we talked about different ways to maximize her calcium and vitamin D would recheck those in a couple months.  Would also do other labs as at that time she would be eligible for treatment.  But will get a baseline bone density.  Spent 30 minutes discussing treatment options with her  Follow-Up Instructions: Will call with results  Orders:  No orders of the defined types were placed in this encounter.  No orders of the defined types were placed in this encounter.     Procedures: No  procedures performed   Clinical Data: No additional findings.   Subjective: No chief complaint on file.   HPI pleasant 67 year old woman who is seen in referral for evaluation for osteoporosis treatment.  She is recovering from a left hip fracture.  Referred by [name]  Review of Systems  All other systems reviewed and are negative.    Objective: Vital Signs: There were no vitals taken for this visit.  Physical Exam Constitutional:      Appearance: Normal appearance.  Pulmonary:     Effort: Pulmonary effort is normal.  Skin:    General: Skin is warm and dry.  Neurological:     General: No focal deficit present.     Mental Status: She is alert and oriented to person, place, and time.  Psychiatric:        Mood and Affect: Mood normal.        Behavior: Behavior normal.     Ortho Exam  Specialty Comments:  No specialty comments available.  Imaging: No results found.   PMFS History: Patient Active Problem List   Diagnosis Date Noted   Age-related osteoporosis without current pathological fracture 07/08/2023   Accidental fall 05/21/2023   S/p left  hip fracture 05/20/2023   Lipoid pneumonia (HCC) 12/06/2022   COPD (chronic obstructive pulmonary disease) (HCC) 12/06/2022   Spondylosis of lumbar region without myelopathy or radiculopathy 09/26/2016   Cervical pain 08/21/2015   Degeneration of thoracic intervertebral disc 08/21/2015   Adhesive capsulitis of shoulder 08/21/2015   Chronic pain syndrome 08/21/2015   Osteoarthritis 03/22/2015   Cough    Hypokalemia 08/15/2014   Nausea with vomiting 08/15/2014   Diarrhea 08/15/2014   Flu-like symptoms 08/15/2014   Breast cancer, female (HCC) 07/28/2014   Chemotherapy induced nausea and vomiting 03/28/2014   Palmar plantar erythrodysaesthesia 03/18/2014   Rash 03/04/2014   Bronchitis 03/04/2014   Anorexia 03/04/2014   Altered taste 03/04/2014   Weight loss 03/04/2014   Insomnia 03/04/2014    Transaminitis 03/04/2014   Extremity edema 02/25/2014   Dehydration 02/25/2014   Thrush of mouth and esophagus (HCC) 02/02/2014   Diarrhea due to drug 02/02/2014   Breast cancer of upper-outer quadrant of right female breast (HCC) 12/20/2013   Rheumatoid arthritis (HCC) 04/03/2013   TOBACCO ABUSE 06/16/2009   PSVT 06/16/2009   FIBROMYALGIA 06/16/2009   HEPATITIS B 07/17/2006   HEPATITIS C 07/17/2006   GASTROESOPHAGEAL REFLUX, NO ESOPHAGITIS 07/17/2006   Past Medical History:  Diagnosis Date   Allergy    Phreesia 03/21/2020   Anemia    Phreesia 03/21/2020   Anxiety    Arthritis    Phreesia 03/21/2020   Blood transfusion without reported diagnosis    Phreesia 03/21/2020   Cancer (HCC)    right breast cancer   Depression    Depression    Phreesia 03/21/2020   Fibromyalgia    GERD (gastroesophageal reflux disease)    Phreesia 03/21/2020   Heavy smoker    Hepatitis C    Pt was given too much interferon - critical care x2d, 5 days total hosp   Neuromuscular disorder (HCC)    Phreesia 03/21/2020   Osteoporosis    Perimenopausal 11/18/2003   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), hypoglycemia 2/04-resolved with PO calcium + D, SVT, was treated with iterferon and ribavirin-10/3-2/04   Personal history of chemotherapy    PUD (peptic ulcer disease)    Rheumatoid arthritis(714.0)    possible   SVT (supraventricular tachycardia) (HCC)    Wears glasses     Family History  Problem Relation Age of Onset   Dementia Mother    Hypertension Mother    Hyperlipidemia Mother    Cancer Father 63       lung   Dementia Father    Breast cancer Maternal Aunt    Cancer Paternal Uncle    Breast cancer Cousin    Colon cancer Neg Hx     Past Surgical History:  Procedure Laterality Date   ABDOMINAL HYSTERECTOMY     BREAST SURGERY N/A    Phreesia 03/21/2020   CESAREAN SECTION  09/17/1980   CESAREAN SECTION N/A    Phreesia  03/21/2020   COLONOSCOPY     DILATION AND CURETTAGE OF UTERUS     hysterectomy-for dysmenorrhea  09/17/2000   INTRAMEDULLARY (IM) NAIL INTERTROCHANTERIC Left 05/21/2023   Procedure: INTRAMEDULLARY (IM) NAIL INTERTROCHANTERIC WITH HARDWARE REMOVAL;  Surgeon: Huel Cote, MD;  Location: [facility];  Service: Orthopedics;  Laterality: Left;   JOINT REPLACEMENT N/A    Phreesia 03/21/2020   MASTECTOMY Right 2016   MASTECTOMY W/ SENTINEL NODE BIOPSY Right 07/28/2014   Procedure: RIGHT MASTECTOMY WITH SENTINEL LYMPH NODE BIOPSY ;  Surgeon: Almond Lint, MD;  Location: [facility];  Service: General;  Laterality: Right;   NASAL SINUS SURGERY  03/20/1984   for deviated septum   PORTACATH PLACEMENT Left 01/20/2014   Procedure: INSERTION PORT-A-CATH;  Surgeon: Almond Lint, MD;  Location: [facility];  Service: General;  Laterality: Left;   TONSILLECTOMY  05/20/1961   UPPER GI ENDOSCOPY     Social History   Occupational History   Occupation: disability  Tobacco Use   Smoking status: Every Day   Smokeless tobacco: Never   Tobacco comments:    Pt is currently vapping ARJ 12/06/22  Vaping Use   Vaping status: Every Day  Substance and Sexual Activity   Alcohol use: Not Currently    Comment: denies; reports 3-4 beers/night to Dr. Normand Sloop with 6 pack on weekend; hx of 12 pack/day EtOH abuse in 93    Drug use: No    Comment: had perscription drug abuse problem in 1993; previous IVDU/inranasal cocaine in her 20s-contracted   Sexual activity: Not Currently    Birth control/protection: Surgical

## 2023-07-08 NOTE — Addendum Note (Signed)
Addended by: York Spaniel on: 07/08/2023 09:23 AM   Modules accepted: Orders

## 2023-07-09 ENCOUNTER — Encounter (HOSPITAL_BASED_OUTPATIENT_CLINIC_OR_DEPARTMENT_OTHER): Payer: Medicare HMO | Admitting: Orthopaedic Surgery

## 2023-07-13 ENCOUNTER — Encounter (HOSPITAL_BASED_OUTPATIENT_CLINIC_OR_DEPARTMENT_OTHER): Payer: Self-pay

## 2023-07-14 ENCOUNTER — Other Ambulatory Visit (HOSPITAL_BASED_OUTPATIENT_CLINIC_OR_DEPARTMENT_OTHER): Payer: Self-pay | Admitting: Physician Assistant

## 2023-07-14 ENCOUNTER — Encounter (HOSPITAL_BASED_OUTPATIENT_CLINIC_OR_DEPARTMENT_OTHER): Payer: Medicare HMO | Admitting: Orthopaedic Surgery

## 2023-07-14 DIAGNOSIS — M810 Age-related osteoporosis without current pathological fracture: Secondary | ICD-10-CM

## 2023-07-17 ENCOUNTER — Ambulatory Visit (INDEPENDENT_AMBULATORY_CARE_PROVIDER_SITE_OTHER): Payer: Medicare HMO | Admitting: Otolaryngology

## 2023-07-30 ENCOUNTER — Ambulatory Visit (HOSPITAL_BASED_OUTPATIENT_CLINIC_OR_DEPARTMENT_OTHER)

## 2023-07-30 ENCOUNTER — Ambulatory Visit (HOSPITAL_BASED_OUTPATIENT_CLINIC_OR_DEPARTMENT_OTHER): Payer: Medicare HMO | Admitting: Orthopaedic Surgery

## 2023-07-30 DIAGNOSIS — S7222XA Displaced subtrochanteric fracture of left femur, initial encounter for closed fracture: Secondary | ICD-10-CM | POA: Diagnosis not present

## 2023-07-30 NOTE — Progress Notes (Signed)
 Post Operative Evaluation      Procedure/Date of Surgery: Left hip cephalomedullary IM nail with hardware removal 05/21/2023   Interval History:    Patient presents today post the above procedure due to a left subtrochanteric fracture.  This time she is continuing to improve.  He is now walking with a walker and only mild limp on the left side. PMH/PSH/Family History/Social History/Meds/Allergies:         Past Medical History:  Diagnosis Date   Allergy      Phreesia 03/21/2020   Anemia      Phreesia 03/21/2020   Anxiety     Arthritis      Phreesia 03/21/2020   Blood transfusion without reported diagnosis      Phreesia 03/21/2020   Cancer (HCC)      right breast cancer   Depression     Depression      Phreesia 03/21/2020   Fibromyalgia     GERD (gastroesophageal reflux disease)      Phreesia 03/21/2020   Heavy smoker     Hepatitis C      Pt was given too much interferon - critical care x2d, 5 days total hosp   Neuromuscular disorder (HCC)      Phreesia 03/21/2020   Osteoporosis     Perimenopausal 11/18/2003    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), hypoglycemia 2/04-resolved with PO calcium + D, SVT, was treated with iterferon and ribavirin-10/3-2/04   Personal history of chemotherapy     PUD (peptic ulcer disease)     Rheumatoid arthritis(714.0)      possible   SVT (supraventricular tachycardia) (HCC)     Wears glasses               Past Surgical History:  Procedure Laterality Date   ABDOMINAL HYSTERECTOMY       BREAST SURGERY N/A      Phreesia 03/21/2020   CESAREAN SECTION   09/17/1980   CESAREAN SECTION N/A      Phreesia 03/21/2020   COLONOSCOPY       DILATION AND CURETTAGE OF UTERUS       hysterectomy-for dysmenorrhea   09/17/2000   INTRAMEDULLARY (IM) NAIL INTERTROCHANTERIC Left 05/21/2023    Procedure: INTRAMEDULLARY (IM) NAIL INTERTROCHANTERIC WITH HARDWARE REMOVAL;  Surgeon:  Huel Cote, MD;  Location: [facility];  Service: Orthopedics;  Laterality: Left;   JOINT REPLACEMENT N/A      Phreesia 03/21/2020   MASTECTOMY Right 2016   MASTECTOMY W/ SENTINEL NODE BIOPSY Right 07/28/2014    Procedure: RIGHT MASTECTOMY WITH SENTINEL LYMPH NODE BIOPSY ;  Surgeon: Almond Lint, MD;  Location: [facility];  Service: General;  Laterality: Right;   NASAL SINUS SURGERY   03/20/1984    for deviated septum   PORTACATH PLACEMENT Left 01/20/2014    Procedure: INSERTION PORT-A-CATH;  Surgeon: Almond Lint, MD;  Location: [facility];  Service: General;  Laterality: Left;   TONSILLECTOMY   05/20/1961   UPPER GI ENDOSCOPY            Social History         Socioeconomic History   Marital status: Divorced      Spouse name: Not on file   Number of children: Not on file   Years of education: Not on file   Highest education level: Not on file  Occupational History   Occupation: disability  Tobacco Use   Smoking  status: Every Day   Smokeless tobacco: Never   Tobacco comments:      Pt is currently vapping ARJ 12/06/22  Vaping Use   Vaping status: Every Day  Substance and Sexual Activity   Alcohol use: Not Currently      Comment: denies; reports 3-4 beers/night to Dr. 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     Hepatitis B & C    Has 2 dogs and 5 cats    Did inpatient substance abuse rehab in 1993              Social Drivers of Health        Financial Resource Strain: Medium Risk (01/06/2023)    Received from  [facility]    Overall Financial Resource Strain (CARDIA)     Difficulty of Paying Living Expenses: Somewhat hard  Food Insecurity: Patient Declined (05/22/2023)    Hunger Vital Sign     Worried About Running Out of Food in the Last Year: Patient declined     Ran Out of Food in the Last Year: Patient declined  Transportation Needs: Patient Declined (05/22/2023)    PRAPARE - Therapist, art (Medical): Patient declined     Lack of Transportation (Non-Medical): Patient declined  Recent Concern: Transportation Needs - Unmet Transportation Needs (03/25/2023)    Received from [facility] - Transportation     Lack of Transportation (Medical): Yes     Lack of Transportation (Non-Medical): Yes  Physical Activity: Insufficiently Active (01/06/2023)    Received from [facility]    Exercise Vital Sign     Days of Exercise per Week: 1 day     Minutes of Exercise per Session: 20 min  Stress: No Stress Concern Present (03/25/2023)    Received from [facility] - Occupational Stress Questionnaire     Feeling of Stress : Only a little  Recent Concern: Stress - Stress Concern Present (01/06/2023)    Received from [facility] of Occupational Health - Occupational Stress Questionnaire     Feeling of Stress : Rather much  Social Connections: Patient Declined (05/22/2023)    Social Connection and Isolation Panel [NHANES]     Frequency of Communication with Friends and Family: Patient declined     Frequency of Social Gatherings with Friends and Family: Patient declined     Attends Religious Services: Patient declined     Database administrator or Organizations: Patient declined     Attends Banker Meetings: Patient declined     Marital Status: Patient declined         Family History  Problem Relation Age of Onset   Dementia Mother     Hypertension Mother     Hyperlipidemia Mother      Cancer Father 54        lung   Dementia Father  Breast cancer Maternal Aunt     Cancer Paternal Uncle     Breast cancer Cousin     Colon cancer Neg Hx          Allergies       Allergies  Allergen Reactions   Pegfilgrastim Hives      Blistering rash all over body   Codeine Itching and Nausea Only      Pt has had codeine with pre-administration to prevent reaction without any problem.   Other Reaction(s): nausea and vomiting, itching            Current Outpatient Medications  Medication Sig Dispense Refill   acetaminophen (TYLENOL) 325 MG tablet Take 2 tablets (650 mg total) by mouth every 6 (six) hours as needed for mild pain (pain score 1-3), fever or headache.       ALPRAZolam (XANAX) 0.5 MG tablet Take 1 tablet (0.5 mg total) by mouth 2 (two) times daily as needed for anxiety. 10 tablet 0   aspirin 325 MG tablet Take 1 tablet (325 mg total) by mouth daily.       atenolol (TENORMIN) 50 MG tablet Take 1 tablet (50 mg total) by mouth daily. TAKE 1 TABLET(50 MG) BY MOUTH TWICE DAILY       B Complex-C (B-COMPLEX WITH VITAMIN C) tablet Take 1 tablet by mouth 2 (two) times a week.       famotidine (PEPCID) 40 MG tablet Take 40 mg by mouth at bedtime.       fluticasone (FLONASE) 50 MCG/ACT nasal spray Place 2 sprays into both nostrils daily. 16 g 6   gabapentin (NEURONTIN) 300 MG capsule Take 1 capsule (300 mg total) by mouth 2 (two) times daily.       letrozole (FEMARA) 2.5 MG tablet Take 2.5 mg by mouth daily.       melatonin 5 MG TABS Take 1 tablet (5 mg total) by mouth at bedtime as needed.       Misc Natural Products (TUMERSAID) TABS as directed Orally       Multiple Vitamins tablet Take by mouth.       Multiple Vitamins-Minerals (WOMENS MULTIVITAMIN PO) Take 1 tablet by mouth 3 (three) times a week.       ondansetron (ZOFRAN) 8 MG tablet Take 1 tablet (8 mg total) by mouth every 8 (eight) hours as needed for nausea or vomiting. (Patient not taking: Reported on 03/20/2023) 30  tablet 3   Oxycodone HCl 10 MG TABS Take 1 tablet (10 mg total) by mouth every 6 (six) hours as needed for up to 14 days. 56 tablet 0   pantoprazole (PROTONIX) 40 MG tablet Take 40 mg by mouth 2 (two) times daily.       polyethylene glycol (MIRALAX / GLYCOLAX) 17 g packet Take 17 g by mouth daily as needed for moderate constipation.       predniSONE (DELTASONE) 10 MG tablet TAKE 1 TABLET(10 MG) BY MOUTH DAILY WITH BREAKFAST 90 tablet 0   sertraline (ZOLOFT) 50 MG tablet Take 25 mg by mouth at bedtime.       Tiotropium Bromide-Olodaterol (STIOLTO RESPIMAT) 2.5-2.5 MCG/ACT AERS Inhale 2 puffs into the lungs daily. (Patient not taking: Reported on 03/20/2023) 1 each 5   Turmeric (QC TUMERIC COMPLEX PO) Take 1 tablet by mouth daily.       Vitamin D, Ergocalciferol, (DRISDOL) 1.25 MG (50000 UNIT) CAPS capsule Take 1 capsule (50,000 Units total) by mouth every 7 (seven) days.  No current facility-administered medications for this visit.      Imaging Results (Last 48 hours)  No results found.     Review of Systems:   A ROS was performed including pertinent positives and negatives as documented in the HPI.     Musculoskeletal Exam:     There were no vitals taken for this visit.   Surgical incisions are well-appearing without erythema or drainage.  Wound edges remained well-approximated after staple removal.  Distal neurosensory exam intact.   Imaging:   Xray (left femur 2 views): Intramedullary nail in good positioning with improved alignment of subtrochanteric fracture.     I personally reviewed and interpreted the radiographs.     Assessment:   Status post IM nailing of the left femur due to a periprosthetic subtrochanteric fracture.  Surgical hardware is well-appearing well-positioned.  At this time she is continuing to improve and walking longer distances.  I will plan to see her back in 3 months for final check Plan :     -Return to clinic 3 months for final check          I personally saw and evaluated the patient, and participated in the management and treatment plan.

## 2023-07-31 ENCOUNTER — Ambulatory Visit (HOSPITAL_BASED_OUTPATIENT_CLINIC_OR_DEPARTMENT_OTHER)
Admission: RE | Admit: 2023-07-31 | Discharge: 2023-07-31 | Disposition: A | Discharge status: Home / Self Care | Payer: Medicare HMO | Source: Ambulatory Visit | Attending: Physician Assistant | Admitting: Physician Assistant

## 2023-07-31 DIAGNOSIS — M810 Age-related osteoporosis without current pathological fracture: Secondary | ICD-10-CM | POA: Insufficient documentation

## 2023-08-05 ENCOUNTER — Telehealth (INDEPENDENT_AMBULATORY_CARE_PROVIDER_SITE_OTHER): Payer: Self-pay | Admitting: Physician Assistant

## 2023-08-05 DIAGNOSIS — M150 Primary generalized (osteo)arthritis: Secondary | ICD-10-CM

## 2023-08-05 NOTE — Addendum Note (Signed)
 Addended by: Polly Cobia on: 08/05/2023 01:05 PM   Modules accepted: Level of Service

## 2023-08-05 NOTE — Telephone Encounter (Signed)
 Sent in error

## 2023-08-05 NOTE — Telephone Encounter (Signed)
 error

## 2023-08-08 ENCOUNTER — Telehealth: Payer: Self-pay | Admitting: Orthopaedic Surgery

## 2023-08-08 NOTE — Telephone Encounter (Signed)
 Katherin from [employer] called. She would like verbal orders for PT. 1wk 9. Her cb# [phone]

## 2023-08-08 NOTE — Telephone Encounter (Signed)
 After verbal discussion with Dr. Steward Drone I attempted to call back [name] and the wrong number was given so I was unable to leave verbal orders.

## 2023-08-11 ENCOUNTER — Telehealth (HOSPITAL_BASED_OUTPATIENT_CLINIC_OR_DEPARTMENT_OTHER): Payer: Self-pay | Admitting: Orthopaedic Surgery

## 2023-08-11 NOTE — Telephone Encounter (Signed)
 Called and gave verbal ok

## 2023-08-11 NOTE — Telephone Encounter (Signed)
 [name] from [employer] called. She would like verbal orders for PT. 1wk 9. Her cb# [phone]

## 2023-08-21 ENCOUNTER — Ambulatory Visit (INDEPENDENT_AMBULATORY_CARE_PROVIDER_SITE_OTHER): Payer: Medicare HMO | Admitting: Otolaryngology

## 2023-08-22 ENCOUNTER — Telehealth: Payer: Self-pay

## 2023-08-22 NOTE — Telephone Encounter (Signed)
-----   Message from Caily Rakers Persons sent at 08/11/2023 12:53 PM EDT ----- Spoke with Corrie Dandy today we went over her bone density which was -2.6.  Her calcium was just a little bit low but she is working on this.  We talked about possibly starting on Evenity given her bone density and her young age she would like to discuss this with her cardiologist as she does have a history of SVT I do not think it will be a problem we will go ahead and can we get authorization for Evenity thank you ----- Message ----- From: Interface, Rad Results In Sent: 07/31/2023   4:33 PM EDT To: [place] Persons, PA

## 2023-08-22 NOTE — Telephone Encounter (Signed)
VOB submitted for [employer] through [employer] portal

## 2023-09-02 ENCOUNTER — Other Ambulatory Visit: Payer: Self-pay

## 2023-09-02 MED ORDER — ATENOLOL 50 MG PO TABS: 50.0000 mg | ORAL_TABLET | Freq: Two times a day (BID) | ORAL | 1 refills | Status: DC

## 2023-09-25 NOTE — Progress Notes (Signed)
 Cardiology Office Note    Date:  09/26/2023  ID:  [name], Crawford 10-Mar-1956, MRN [id number] PCP:  [name], PA-C  Cardiologist:  [name], MD  Electrophysiologist:  None   Chief Complaint: Follow up for SVT   History of Present Illness: [name]    [name] is a 68 y.o. female with visit-pertinent history of reported SVT with heart rate up to 200, tobacco use, rheumatoid arthritis.   Patient has a reported history of SVT, noted to have an episode in an ambulance and was documented to be as high as 200 bpm however strips have not been available.  She had a stress echo in October 2008 that showed normal LV function with no ischemia.  She will cardiac monitor October 2008 that showed sinus rhythm with occasional PACs and PVCs.  Echocardiogram in 05/2014 showed normal LV function, moderate LVH, grade 1 diastolic dysfunction.  Patient last seen in office on 05/22/2022 by [name], PA.  Patient reported that she had stopped smoking cigarettes and has started vaping.  She had noted improvement in her shortness of breath, clearing her throat and coughing had improved.  Patient reported leg pain with Crestor  and discontinued medication.  Patient reported that she knew how to control her palpitations, if she felt as if she was going into SVT she would cough or hold her breath.  She reported that she is not interested in ablation due to surgery risk.  It was recommended that she follow-up in 1 year.  On chart review in 05/2023 patient was admitted following a mechanical fall with an acute displaced proximal left femoral metaphyseal fracture, patient underwent left hip cephalomedullary nailing and removal of previous left hip.  Today she presents for follow-up.  She reports that she has been doing well overall.  She notes occasional palpitations that she reports have not significantly changed.  She notes she may have 1 or 2 fast heartbeats but quickly resolves.  She denies any associated  symptoms.  She denies any sustained palpitations.  She does note rare episodes of chest discomfort that she is attributed to acid reflux as it improves with her antacids.  She denies any chest pain or increased shortness of breath with exertion.  She does note she was being treated currently with Femara  for concerning lung masses, patient has history of breast cancer.  She is being followed by hematology and oncology for this, biopsy did not indicate malignancy per patient.  She notes that since starting on primary she has had some increased fatigue, overall tolerating well.  She request refills of her atenolol .  Patient reports that she did quit smoking cigarettes however continues to vape daily.  Despite her recent mechanical fall and orthopedic surgery she is able to achieve greater than 4 METS of activity, she is regularly walking with her walker to assist with balance and is tolerating well.  ROS: .   Today she denies chest pain, shortness of breath, lower extremity edema, fatigue, palpitations, melena, hematuria, hemoptysis, diaphoresis, weakness, presyncope, syncope, orthopnea, and PND.  All other systems are reviewed and otherwise negative. Studies Reviewed: [name]   EKG:  EKG is ordered today, personally reviewed, demonstrating  EKG Interpretation Date/Time:  Friday Sep 26 2023 09:31:06 EDT Ventricular Rate:  57 PR Interval:  192 QRS Duration:  68 QT Interval:  440 QTC Calculation: 428 R Axis:   -33  Text Interpretation: Sinus bradycardia Left axis deviation Nonspecific ST abnormality When compared with ECG of  20-May-2023 20:22, PREVIOUS ECG IS PRESENT Confirmed by [name] [phone]) on 09/26/2023 9:45:24 AM   CV Studies: Cardiac studies reviewed are outlined and summarized above. Otherwise please see EMR for full report.     Current Reported Medications:.    Current Meds  Medication Sig   acetaminophen  (TYLENOL ) 325 MG tablet Take 2 tablets (650 mg total) by mouth every 6 (six) hours as  needed for mild pain (pain score 1-3), fever or headache.   B Complex-C (B-COMPLEX WITH VITAMIN C) tablet Take 1 tablet by mouth 2 (two) times a week.   famotidine  (PEPCID ) 40 MG tablet Take 40 mg by mouth at bedtime.   fluticasone  (FLONASE ) 50 MCG/ACT nasal spray Place 2 sprays into both nostrils daily.   gabapentin  (NEURONTIN ) 300 MG capsule Take 1 capsule (300 mg total) by mouth 2 (two) times daily.   letrozole  (FEMARA ) 2.5 MG tablet Take 2.5 mg by mouth daily.   Misc Natural Products (TUMERSAID) TABS as directed Orally   Multiple Vitamins tablet Take by mouth.   Multiple Vitamins-Minerals (WOMENS MULTIVITAMIN PO) Take 1 tablet by mouth 3 (three) times a week.   ondansetron  (ZOFRAN ) 8 MG tablet Take 1 tablet (8 mg total) by mouth every 8 (eight) hours as needed for nausea or vomiting.   pantoprazole  (PROTONIX ) 40 MG tablet Take 40 mg by mouth 2 (two) times daily.   polyethylene glycol (MIRALAX  / GLYCOLAX ) 17 g packet Take 17 g by mouth daily as needed for moderate constipation.   sertraline  (ZOLOFT ) 50 MG tablet Take 25 mg by mouth at bedtime.   Turmeric (QC TUMERIC COMPLEX PO) Take 1 tablet by mouth daily.   [DISCONTINUED] atenolol  (TENORMIN ) 50 MG tablet Take 1 tablet (50 mg total) by mouth 2 (two) times daily.   Physical Exam:    VS:  BP 108/82 (BP Location: Left Arm, Patient Position: Sitting, Cuff Size: Normal)   Pulse 61   Ht 5\' 4"  (1.626 m)   Wt 159 lb (72.1 kg)   SpO2 98%   BMI 27.29 kg/m    Wt Readings from Last 3 Encounters:  09/26/23 159 lb (72.1 kg)  07/08/23 170 lb 6.4 oz (77.3 kg)  03/21/23 179 lb 1.6 oz (81.2 kg)    GEN: Well nourished, well developed in no acute distress NECK: No JVD; No carotid bruits CARDIAC: RRR, no murmurs, rubs, gallops RESPIRATORY:  Clear to auscultation without rales, wheezing or rhonchi  ABDOMEN: Soft, non-tender, non-distended EXTREMITIES:  No edema; No acute deformity     Asessement and Plan:.    SVT: Patient with remote history of  SVT with a episode reportedly up to 200 bpm around 2008.  Today she reports that her palpitations are overall stable.  She will note 1-2 fast beats every other day, she notes that she presents any further palpitations with coughing or bearing down.  She reports she knows how to control her palpitations.  She does note a slight increase in palpitations with starting of Femara  last year, she notes these are not bothersome to her. Discussed cardiac monitor, patient deferred at this time, she notes if she feels is needed she will notify the office. Continue atenolol  50 mg twice daily, refill provided.   Hypertension: Blood pressure today 108/82. Continue atenolol  50 mg twice daily.   Hyperlipidemia: Patient previously started on statin therapy for elevated ASCVD risk.  Patient has declined statin therapy, she notes that she gets significant myalgias with this and is concerned for increased risk for dementia.  Discussed concerns, patient continues to decline.  Vaping: Patient reports significant history of tobacco use, she has since quit smoking cigarettes however continues to vape.  Complete cessation encouraged.   Disposition: F/u with Dr. Audery Blazing in one year or sooner if needed.   Signed, Clarke Peretz D Nihaal Friesen, NP

## 2023-09-26 ENCOUNTER — Ambulatory Visit: Attending: Cardiology | Admitting: Cardiology

## 2023-09-26 ENCOUNTER — Encounter: Payer: Self-pay | Admitting: Cardiology

## 2023-09-26 VITALS — BP 108/82 | HR 61 | Ht 64.0 in | Wt 159.0 lb

## 2023-09-26 DIAGNOSIS — I10 Essential (primary) hypertension: Secondary | ICD-10-CM | POA: Diagnosis not present

## 2023-09-26 DIAGNOSIS — I471 Supraventricular tachycardia, unspecified: Secondary | ICD-10-CM | POA: Diagnosis not present

## 2023-09-26 DIAGNOSIS — F17200 Nicotine dependence, unspecified, uncomplicated: Secondary | ICD-10-CM

## 2023-09-26 DIAGNOSIS — E782 Mixed hyperlipidemia: Secondary | ICD-10-CM

## 2023-09-26 MED ORDER — ATENOLOL 50 MG PO TABS: 50.0000 mg | ORAL_TABLET | Freq: Two times a day (BID) | ORAL | 1 refills | Status: DC

## 2023-09-26 NOTE — Patient Instructions (Signed)
 Medication Instructions:  NO CHANGES *If you need a refill on your cardiac medications before your next appointment, please call your pharmacy*  Lab Work: NO LABS If you have labs (blood work) drawn today and your tests are completely normal, you will receive your results only by: MyChart Message (if you have MyChart) OR A paper copy in the mail If you have any lab test that is abnormal or we need to change your treatment, we will call you to review the results.  Testing/Procedures: NO TESTING  Follow-Up: At [facility], you and your health needs are our priority.  As part of our continuing mission to provide you with exceptional heart care, our providers are all part of one team.  This team includes your primary Cardiologist (physician) and Advanced Practice Providers or APPs (Physician Assistants and Nurse Practitioners) who all work together to provide you with the care you need, when you need it.  Your next appointment:   1 year(s)  Provider:   [name], MD

## 2023-10-16 ENCOUNTER — Inpatient Hospital Stay

## 2023-10-16 ENCOUNTER — Inpatient Hospital Stay: Payer: Medicare HMO | Attending: Hematology and Oncology | Admitting: Hematology and Oncology

## 2023-10-16 VITALS — BP 138/78 | HR 66 | Temp 97.6°F | Resp 16 | Ht 64.0 in | Wt 159.2 lb

## 2023-10-16 DIAGNOSIS — Z1731 Human epidermal growth factor receptor 2 positive status: Secondary | ICD-10-CM | POA: Diagnosis not present

## 2023-10-16 DIAGNOSIS — C50411 Malignant neoplasm of upper-outer quadrant of right female breast: Secondary | ICD-10-CM | POA: Insufficient documentation

## 2023-10-16 DIAGNOSIS — Z79811 Long term (current) use of aromatase inhibitors: Secondary | ICD-10-CM | POA: Diagnosis not present

## 2023-10-16 DIAGNOSIS — D649 Anemia, unspecified: Secondary | ICD-10-CM | POA: Diagnosis not present

## 2023-10-16 DIAGNOSIS — Z9011 Acquired absence of right breast and nipple: Secondary | ICD-10-CM | POA: Diagnosis not present

## 2023-10-16 DIAGNOSIS — M069 Rheumatoid arthritis, unspecified: Secondary | ICD-10-CM | POA: Insufficient documentation

## 2023-10-16 DIAGNOSIS — Z79899 Other long term (current) drug therapy: Secondary | ICD-10-CM | POA: Diagnosis not present

## 2023-10-16 DIAGNOSIS — Z1722 Progesterone receptor negative status: Secondary | ICD-10-CM | POA: Diagnosis not present

## 2023-10-16 DIAGNOSIS — Z170 Estrogen receptor positive status [ER+]: Secondary | ICD-10-CM | POA: Insufficient documentation

## 2023-10-16 LAB — CMP (CANCER CENTER ONLY)
ALT: 6 U/L (ref 0–44)
AST: 12 U/L — ABNORMAL LOW (ref 15–41)
Albumin: 3.9 g/dL (ref 3.5–5.0)
Alkaline Phosphatase: 120 U/L (ref 38–126)
Anion gap: 7 (ref 5–15)
BUN: 10 mg/dL (ref 8–23)
CO2: 27 mmol/L (ref 22–32)
Calcium: 8.9 mg/dL (ref 8.9–10.3)
Chloride: 108 mmol/L (ref 98–111)
Creatinine: 0.66 mg/dL (ref 0.44–1.00)
GFR, Estimated: >60 mL/min (ref >60)
Glucose, Bld: 95 mg/dL (ref 70–99)
Potassium: 3.3 mmol/L — ABNORMAL LOW (ref 3.5–5.1)
Sodium: 142 mmol/L (ref 135–145)
Total Bilirubin: 0.5 mg/dL (ref 0.0–1.2)
Total Protein: 7.3 g/dL (ref 6.5–8.1)

## 2023-10-16 LAB — CBC WITH DIFFERENTIAL (CANCER CENTER ONLY)
Abs Immature Granulocytes: 0.01 10*3/uL (ref 0.00–0.07)
Basophils Absolute: 0 10*3/uL (ref 0.0–0.1)
Basophils Relative: 0 %
Eosinophils Absolute: 0.2 10*3/uL (ref 0.0–0.5)
Eosinophils Relative: 3 %
HCT: 33.4 % — ABNORMAL LOW (ref 36.0–46.0)
Hemoglobin: 10.6 g/dL — ABNORMAL LOW (ref 12.0–15.0)
Immature Granulocytes: 0 %
Lymphocytes Relative: 36 %
Lymphs Abs: 2.6 10*3/uL (ref 0.7–4.0)
MCH: 24 pg — ABNORMAL LOW (ref 26.0–34.0)
MCHC: 31.7 g/dL (ref 30.0–36.0)
MCV: 75.7 fL — ABNORMAL LOW (ref 80.0–100.0)
Monocytes Absolute: 0.4 10*3/uL (ref 0.1–1.0)
Monocytes Relative: 6 %
Neutro Abs: 4.1 10*3/uL (ref 1.7–7.7)
Neutrophils Relative %: 55 %
Platelet Count: 273 10*3/uL (ref 150–400)
RBC: 4.41 MIL/uL (ref 3.87–5.11)
RDW: 17.2 % — ABNORMAL HIGH (ref 11.5–15.5)
WBC Count: 7.4 10*3/uL (ref 4.0–10.5)
nRBC: 0 % (ref 0.0–0.2)

## 2023-10-16 LAB — IRON AND IRON BINDING CAPACITY (CC-WL,HP ONLY)
Iron: 36 ug/dL (ref 28–170)
Saturation Ratios: 10 % — ABNORMAL LOW (ref 10.4–31.8)
TIBC: 368 ug/dL (ref 250–450)
UIBC: 332 ug/dL (ref 148–442)

## 2023-10-16 LAB — FERRITIN: Ferritin: 39 ng/mL (ref 11–307)

## 2023-10-16 NOTE — Assessment & Plan Note (Signed)
 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 Status post neoadjuvant chemotherapy with TCHPx2, GCHPx1, CHPx1, HPx2 , complete pathologic response with only residual DCIS 7 cm remaining. Anastrozole  started 08/19/2014 stopped 09/18/2014, Herceptin  stopped April 2016    PET CT scan 10/28/2022: Hypermetabolic left upper lobe mass (3.1 x 4.6 cm) with hypermetabolic nodules in the right hemithorax (11 mm) and hypermetabolic ipsilateral mediastinal lymph node (AP window lymph node 5 mm). 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 Baldwin Levee.  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   Current treatment: Letrozole  Letrozole  toxicities: tolerating it well   Pain issues: Going to see pain management History of left hip fracture Parotid gland mass Rheumatoid arthritis and severe osteoarthritis  Return to clinic in 1 year for follow-up

## 2023-10-16 NOTE — Progress Notes (Signed)
 Patient Care Team: Jackson, Kerra J, PA-C as PCP - General (Physician Assistant) Lenise Quince, MD as PCP - Cardiology (Cardiology) Cameron Cea, MD as Consulting Physician (Hematology and Oncology) Lockie Rima, MD as Consulting Physician (General Surgery) Beverlee Bucco, MD as Consulting Physician (Radiation Oncology) Adelaide Adjutant, MD as Consulting Physician (Physical Medicine and Rehabilitation) Audery Blazing Deannie Fabian, MD as Consulting Physician (Cardiology) Tita Form, MD as Consulting Physician (Internal Medicine)  DIAGNOSIS:  Encounter Diagnosis  Name Primary?   Malignant neoplasm of upper-outer quadrant of right breast in female, estrogen receptor positive (HCC) Yes    SUMMARY OF ONCOLOGIC HISTORY: Oncology History  Breast cancer of upper-outer quadrant of right female breast (HCC)  12/16/2013 Mammogram   1. A palpable 4.1 cm diameter mass/asymmetry with associated coarse heterogeneous microcalcifications at 11 o'clock 2 cm from the right nipple demonstrates mammographic and sonographic features highly suggestive of malignancy.    12/16/2013 Initial Biopsy   Breast cancer of upper-outer quadrant of right female breast; Invasive Ductal Cancer Er 100%, PR: 0%; Her2 Positive Ratio 4.28: Ki 67:24%;    12/23/2013 Breast MRI   Right Breast 4.4 x 3 x 2.5 cm and a sub cm satellite lesion. No LN   01/28/2014 - 05/27/2014 Neo-Adjuvant Chemotherapy   TC HP x2 cycles ( developed hand-foot syndrome) GC HP x1 cycle (developed dehydration and renal failure) CHP from 04/08/2014   07/21/2014 - 09/01/2014 Chemotherapy   Herceptin  maintenance therapy   07/28/2014 Surgery   Right breast mastectomy: DCIS 7 cm, 4 lymph nodes negative, no invasive breast cancer was identified, complete pathologic response   08/19/2014 - 04/20/2015 Anti-estrogen oral therapy   Anastrozole  1 mg daily stopped 09/23/2014 due to diffuse body aches, diarrhea, nausea, fatigue we switched her to tamoxifen  in 03/22/2015  stopped after 2 weeks with diarrhea, tiredness and nausea     CHIEF COMPLIANT: Surveillance of breast cancer  HISTORY OF PRESENT ILLNESS:   History of Present Illness [name] is a 68 year old female with osteoporosis and osteoarthritis who presents with concerns about bone health and recent fractures.  She has sustained multiple falls resulting in significant injuries, including a left hip fracture in October requiring surgery and a near fracture of the femur on December 31st, stabilized with a rod and screws. Hospitalization followed for five days.  Osteoporosis is confirmed with a bone density score of -2.6, particularly affecting her left forearm. She takes calcium  and vitamin D3 supplements but finds the recommended infusions financially burdensome.  Letrozole , taken for about a year, causes fatigue. She was anemic in March, likely due to surgical blood loss, and occasionally takes iron supplements but is uncertain about her current need.     ALLERGIES:  is allergic to pegfilgrastim  and codeine.  MEDICATIONS:  Current Outpatient Medications  Medication Sig Dispense Refill   acetaminophen  (TYLENOL ) 325 MG tablet Take 2 tablets (650 mg total) by mouth every 6 (six) hours as needed for mild pain (pain score 1-3), fever or headache.     atenolol  (TENORMIN ) 50 MG tablet Take 1 tablet (50 mg total) by mouth 2 (two) times daily. 60 tablet 1   B Complex-C (B-COMPLEX WITH VITAMIN C) tablet Take 1 tablet by mouth 2 (two) times a week.     famotidine  (PEPCID ) 40 MG tablet Take 40 mg by mouth at bedtime.     fluticasone  (FLONASE ) 50 MCG/ACT nasal spray Place 2 sprays into both nostrils daily. 16 g 6   gabapentin  (NEURONTIN ) 300 MG capsule Take  1 capsule (300 mg total) by mouth 2 (two) times daily.     letrozole  (FEMARA ) 2.5 MG tablet Take 2.5 mg by mouth daily.     Misc Natural Products (TUMERSAID) TABS as directed Orally     Multiple Vitamins tablet Take by mouth.     Multiple  Vitamins-Minerals (WOMENS MULTIVITAMIN PO) Take 1 tablet by mouth 3 (three) times a week.     pantoprazole  (PROTONIX ) 40 MG tablet Take 40 mg by mouth 2 (two) times daily.     polyethylene glycol (MIRALAX  / GLYCOLAX ) 17 g packet Take 17 g by mouth daily as needed for moderate constipation.     sertraline  (ZOLOFT ) 50 MG tablet Take 25 mg by mouth at bedtime.     Turmeric (QC TUMERIC COMPLEX PO) Take 1 tablet by mouth daily.     ondansetron  (ZOFRAN ) 8 MG tablet Take 1 tablet (8 mg total) by mouth every 8 (eight) hours as needed for nausea or vomiting. (Patient not taking: Reported on 10/16/2023) 30 tablet 3   No current facility-administered medications for this visit.    PHYSICAL EXAMINATION: ECOG PERFORMANCE STATUS: 1 - Symptomatic but completely ambulatory  Vitals:   10/16/23 0900  BP: 138/78  Pulse: 66  Resp: 16  Temp: 97.6 F (36.4 C)  SpO2: 98%   Filed Weights   10/16/23 0900  Weight: 159 lb 3.2 oz (72.2 kg)      LABORATORY DATA:  I have reviewed the data as listed    Latest Ref Rng & Units 10/16/2023   10:15 AM 05/24/2023    3:40 AM 05/23/2023    3:14 AM  CMP  Glucose 70 - 99 mg/dL 95  130  865   BUN 8 - 23 mg/dL 10  9  12    Creatinine 0.44 - 1.00 mg/dL 7.84  6.96  2.95   Sodium 135 - 145 mmol/L 142  132  132   Potassium 3.5 - 5.1 mmol/L 3.3  3.5  3.6   Chloride 98 - 111 mmol/L 108  102  100   CO2 22 - 32 mmol/L 27  21  22    Calcium  8.9 - 10.3 mg/dL 8.9  8.6  8.8   Total Protein 6.5 - 8.1 g/dL 7.3     Total Bilirubin 0.0 - 1.2 mg/dL 0.5     Alkaline Phos 38 - 126 U/L 120     AST 15 - 41 U/L 12     ALT 0 - 44 U/L 6       Lab Results  Component Value Date   WBC 7.4 10/16/2023   HGB 10.6 (L) 10/16/2023   HCT 33.4 (L) 10/16/2023   MCV 75.7 (L) 10/16/2023   PLT 273 10/16/2023   NEUTROABS 4.1 10/16/2023    ASSESSMENT & PLAN:  Breast cancer of upper-outer quadrant of right female breast (HCC) Right breast invasive ductal carcinoma 4.4 cm in size with a satellite  lesion based on MRI result there were no abnormal lymph nodes ER 100% positive PR 0% HER-2/neu positive (HER-2/CEP 17 ratio 4.28) grade 3 T2, N0, M0 clinical stage IIa. Status post neoadjuvant chemotherapy with TCHPx2, GCHPx1, CHPx1, HPx2 , complete pathologic response with only residual DCIS 7 cm remaining. Anastrozole  started 08/19/2014 stopped 09/18/2014, Herceptin  stopped April 2016    PET CT scan 10/28/2022: Hypermetabolic left upper lobe mass (3.1 x 4.6 cm) with hypermetabolic nodules in the right hemithorax (11 mm) and hypermetabolic ipsilateral mediastinal lymph node (AP window lymph node 5 mm). 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 Baldwin Levee.  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   Current treatment: Letrozole  Letrozole  toxicities: tolerating it well   Pain issues: Going to see pain management History of left hip fracture Parotid gland mass: Patient does not desire for surgery.  We will do scans to see if there is any evidence of progression. Rheumatoid arthritis and severe osteoarthritis  Microcytic anemia: Iron studies are pending.  Today's hemoglobin is 10.6. Plan to perform CT chest abdomen pelvis.  Return to clinic in 1 year for follow-up ------------------------------------- Assessment and Plan Assessment & Plan Osteoporosis Osteoporosis confirmed with T-score of -2.6. Extreme fall risk. Spine normal, left forearm affected. Evenity unaffordable, Zometa discussed as alternative. - Discuss Zometa and check insurance coverage. - Continue calcium  and vitamin D3.  Anemia Anemia likely due to surgeries and chronic conditions. Uncertainty about need for iron supplementation. Persistent anemia despite satisfactory  blood levels in March. - Order blood work to assess anemia.  Osteoarthritis Osteoarthritis present, affecting bone health.  Femur fracture History of femur fracture with recent falls and surgery. Underwent rehabilitation, focusing on bone strengthening.  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 Comfortable with observation if stable. - Order CT scan of neck. - Discuss scan results.  Chronic Obstructive Pulmonary Disease (COPD) COPD present, no significant shortness of breath. Inhaler not used, deemed unnecessary by her despite primary care advice. - Consider annual whole body scans for lung health.      Orders Placed This Encounter  Procedures   CT CHEST ABDOMEN PELVIS W CONTRAST    Standing Status:   Future    Expected Date:   10/30/2023    Expiration Date:   10/15/2024    If indicated for the ordered procedure, I authorize the administration of contrast media per Radiology protocol:   Yes    Does the patient have a contrast media/X-ray dye allergy?:   No    Preferred imaging location?:   [facility]    Release to patient:   Immediate    If indicated for the ordered procedure, I authorize the administration of oral contrast media per Radiology protocol:   Yes   CT Soft Tissue Neck W Contrast    Standing Status:   Future    Expected Date:   10/30/2023    Expiration Date:   10/15/2024    If indicated for the ordered procedure, I authorize the administration of contrast media per Radiology protocol:   Yes    Does the patient have a contrast media/X-ray dye allergy?:   No    Preferred imaging location?:   [facility]    Release to patient:   Immediate [1]   CBC with Differential (Cancer Center Only)    Standing Status:   Future    Number of Occurrences:   1    Expiration Date:   10/15/2024   CMP (Cancer Center only)    Standing Status:   Future    Number of Occurrences:   1    Expiration Date:   10/15/2024   Ferritin     Standing Status:   Future    Number of Occurrences:   1    Expiration Date:   10/15/2024   Iron and Iron Binding Capacity ([facility] only)    Standing Status:  Future    Number of Occurrences:   1    Expiration Date:   10/15/2024   The patient has a good understanding of the overall plan. she agrees with it. she will call with any problems that may develop before the next visit here. Total time spent: 30 mins including face to face time and time spent for planning, charting and co-ordination of care   Margert Sheerer, MD 10/16/23

## 2023-10-29 ENCOUNTER — Ambulatory Visit (HOSPITAL_COMMUNITY)
Admission: RE | Admit: 2023-10-29 | Discharge: 2023-10-29 | Disposition: A | Discharge status: Home / Self Care | Source: Ambulatory Visit | Attending: Hematology and Oncology | Admitting: Hematology and Oncology

## 2023-10-29 ENCOUNTER — Ambulatory Visit (HOSPITAL_COMMUNITY)

## 2023-10-29 ENCOUNTER — Encounter (HOSPITAL_COMMUNITY): Payer: Self-pay

## 2023-10-29 DIAGNOSIS — Z170 Estrogen receptor positive status [ER+]: Secondary | ICD-10-CM | POA: Insufficient documentation

## 2023-10-29 DIAGNOSIS — C50411 Malignant neoplasm of upper-outer quadrant of right female breast: Secondary | ICD-10-CM | POA: Diagnosis not present

## 2023-10-29 MED ORDER — IOHEXOL 300 MG/ML  SOLN
100.0000 mL | Freq: Once | INTRAMUSCULAR | Status: AC | PRN
  Administered 2023-10-29: 100 mL via INTRAVENOUS

## 2023-10-29 MED ORDER — SODIUM CHLORIDE (PF) 0.9 % IJ SOLN
INTRAMUSCULAR | Status: AC
  Filled 2023-10-29: qty 50

## 2023-10-30 ENCOUNTER — Ambulatory Visit (HOSPITAL_BASED_OUTPATIENT_CLINIC_OR_DEPARTMENT_OTHER): Admitting: Orthopaedic Surgery

## 2023-10-30 ENCOUNTER — Ambulatory Visit (HOSPITAL_BASED_OUTPATIENT_CLINIC_OR_DEPARTMENT_OTHER)

## 2023-10-30 DIAGNOSIS — S7222XA Displaced subtrochanteric fracture of left femur, initial encounter for closed fracture: Secondary | ICD-10-CM | POA: Diagnosis not present

## 2023-10-30 NOTE — Progress Notes (Signed)
 Post Operative Evaluation      Procedure/Date of Surgery: Left hip cephalomedullary IM nail with hardware removal 05/21/2023   Interval History:    Patient presents today post the above procedure due to a left subtrochanteric fracture.  Overall doing extremely well.   PMH/PSH/Family History/Social History/Meds/Allergies:         Past Medical History:  Diagnosis Date   Allergy      Phreesia 03/21/2020   Anemia      Phreesia 03/21/2020   Anxiety     Arthritis      Phreesia 03/21/2020   Blood transfusion without reported diagnosis      Phreesia 03/21/2020   Cancer (HCC)      right breast cancer   Depression     Depression      Phreesia 03/21/2020   Fibromyalgia     GERD (gastroesophageal reflux disease)      Phreesia 03/21/2020   Heavy smoker     Hepatitis C      Pt was given too much interferon - critical care x2d, 5 days total hosp   Neuromuscular disorder (HCC)      Phreesia 03/21/2020   Osteoporosis     Perimenopausal 11/18/2003    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), hypoglycemia 2/04-resolved with PO calcium  + D, SVT, was treated with iterferon and ribavirin-10/3-2/04   Personal history of chemotherapy     PUD (peptic ulcer disease)     Rheumatoid arthritis(714.0)      possible   SVT (supraventricular tachycardia) (HCC)     Wears glasses               Past Surgical History:  Procedure Laterality Date   ABDOMINAL HYSTERECTOMY       BREAST SURGERY N/A      Phreesia 03/21/2020   CESAREAN SECTION   09/17/1980   CESAREAN SECTION N/A      Phreesia 03/21/2020   COLONOSCOPY       DILATION AND CURETTAGE OF UTERUS       hysterectomy-for dysmenorrhea   09/17/2000   INTRAMEDULLARY (IM) NAIL INTERTROCHANTERIC Left 05/21/2023    Procedure: INTRAMEDULLARY (IM) NAIL INTERTROCHANTERIC WITH HARDWARE REMOVAL;  Surgeon: Wilhelmenia Harada, MD;  Location: [facility];  Service: Orthopedics;  Laterality:  Left;   JOINT REPLACEMENT N/A      Phreesia 03/21/2020   MASTECTOMY Right 2016   MASTECTOMY W/ SENTINEL NODE BIOPSY Right 07/28/2014    Procedure: RIGHT MASTECTOMY WITH SENTINEL LYMPH NODE BIOPSY ;  Surgeon: Lockie Rima, MD;  Location: [facility];  Service: General;  Laterality: Right;   NASAL SINUS SURGERY   03/20/1984    for deviated septum   PORTACATH PLACEMENT Left 01/20/2014    Procedure: INSERTION PORT-A-CATH;  Surgeon: Lockie Rima, MD;  Location: [facility];  Service: General;  Laterality: Left;   TONSILLECTOMY   05/20/1961   UPPER GI ENDOSCOPY            Social History         Socioeconomic History   Marital status: Divorced      Spouse name: Not on file   Number of children: Not on file   Years of education: Not on file   Highest education level: Not on file  Occupational History   Occupation: disability  Tobacco Use   Smoking status: Every Day   Smokeless tobacco: Never   Tobacco comments:  Pt is currently vapping ARJ 12/06/22  Vaping Use   Vaping status: Every Day  Substance and Sexual Activity   Alcohol use: Not Currently      Comment: denies; reports 3-4 beers/night to Dr. 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     Hepatitis B & C    Has 2 dogs and 5 cats    Did inpatient substance abuse rehab in 1993              Social Drivers of Health        Financial Resource Strain: Medium Risk (01/06/2023)    Received from [facility]    Overall Financial Resource Strain (CARDIA)     Difficulty of  Paying Living Expenses: Somewhat hard  Food Insecurity: Patient Declined (05/22/2023)    Hunger Vital Sign     Worried About Running Out of Food in the Last Year: Patient declined     Ran Out of Food in the Last Year: Patient declined  Transportation Needs: Patient Declined (05/22/2023)    PRAPARE - Therapist, art (Medical): Patient declined     Lack of Transportation (Non-Medical): Patient declined  Recent Concern: Transportation Needs - Unmet Transportation Needs (03/25/2023)    Received from [facility] - Transportation     Lack of Transportation (Medical): Yes     Lack of Transportation (Non-Medical): Yes  Physical Activity: Insufficiently Active (01/06/2023)    Received from [facility]    Exercise Vital Sign     Days of Exercise per Week: 1 day     Minutes of Exercise per Session: 20 min  Stress: No Stress Concern Present (03/25/2023)    Received from [facility] - Occupational Stress Questionnaire     Feeling of Stress : Only a little  Recent Concern: Stress - Stress Concern Present (01/06/2023)    Received from [facility] - Occupational Stress Questionnaire     Feeling of Stress : Rather much  Social Connections: Patient Declined (05/22/2023)    Social Connection and Isolation Panel [NHANES]     Frequency of Communication with Friends and Family: Patient declined     Frequency of Social Gatherings with Friends and Family: Patient declined     Attends Religious Services: Patient declined     Database administrator or Organizations: Patient declined     Attends Banker Meetings: Patient declined     Marital Status: Patient declined         Family History  Problem Relation Age of Onset   Dementia Mother     Hypertension Mother     Hyperlipidemia Mother     Cancer Father 54        lung   Dementia Father     Breast cancer Maternal  Aunt     Cancer Paternal Uncle     Breast cancer  Cousin     Colon cancer Neg Hx          Allergies       Allergies  Allergen Reactions   Pegfilgrastim  Hives      Blistering rash all over body   Codeine Itching and Nausea Only      Pt has had codeine with pre-administration to prevent reaction without any problem.   Other Reaction(s): nausea and vomiting, itching            Current Outpatient Medications  Medication Sig Dispense Refill   acetaminophen  (TYLENOL ) 325 MG tablet Take 2 tablets (650 mg total) by mouth every 6 (six) hours as needed for mild pain (pain score 1-3), fever or headache.       ALPRAZolam  (XANAX ) 0.5 MG tablet Take 1 tablet (0.5 mg total) by mouth 2 (two) times daily as needed for anxiety. 10 tablet 0   aspirin  325 MG tablet Take 1 tablet (325 mg total) by mouth daily.       atenolol  (TENORMIN ) 50 MG tablet Take 1 tablet (50 mg total) by mouth daily. TAKE 1 TABLET(50 MG) BY MOUTH TWICE DAILY       B Complex-C (B-COMPLEX WITH VITAMIN C) tablet Take 1 tablet by mouth 2 (two) times a week.       famotidine  (PEPCID ) 40 MG tablet Take 40 mg by mouth at bedtime.       fluticasone  (FLONASE ) 50 MCG/ACT nasal spray Place 2 sprays into both nostrils daily. 16 g 6   gabapentin  (NEURONTIN ) 300 MG capsule Take 1 capsule (300 mg total) by mouth 2 (two) times daily.       letrozole  (FEMARA ) 2.5 MG tablet Take 2.5 mg by mouth daily.       melatonin 5 MG TABS Take 1 tablet (5 mg total) by mouth at bedtime as needed.       Misc Natural Products (TUMERSAID) TABS as directed Orally       Multiple Vitamins tablet Take by mouth.       Multiple Vitamins-Minerals (WOMENS MULTIVITAMIN PO) Take 1 tablet by mouth 3 (three) times a week.       ondansetron  (ZOFRAN ) 8 MG tablet Take 1 tablet (8 mg total) by mouth every 8 (eight) hours as needed for nausea or vomiting. (Patient not taking: Reported on 03/20/2023) 30 tablet 3   Oxycodone  HCl 10 MG TABS Take 1 tablet (10 mg total) by mouth  every 6 (six) hours as needed for up to 14 days. 56 tablet 0   pantoprazole  (PROTONIX ) 40 MG tablet Take 40 mg by mouth 2 (two) times daily.       polyethylene glycol (MIRALAX  / GLYCOLAX ) 17 g packet Take 17 g by mouth daily as needed for moderate constipation.       predniSONE  (DELTASONE ) 10 MG tablet TAKE 1 TABLET(10 MG) BY MOUTH DAILY WITH BREAKFAST 90 tablet 0   sertraline  (ZOLOFT ) 50 MG tablet Take 25 mg by mouth at bedtime.       Tiotropium Bromide-Olodaterol (STIOLTO RESPIMAT ) 2.5-2.5 MCG/ACT AERS Inhale 2 puffs into the lungs daily. (Patient not taking: Reported on 03/20/2023) 1 each 5   Turmeric (QC TUMERIC COMPLEX PO) Take 1 tablet by mouth daily.       Vitamin D , Ergocalciferol , (DRISDOL ) 1.25 MG (50000 UNIT) CAPS capsule Take 1 capsule (50,000 Units total) by mouth every 7 (seven) days.          No current facility-administered medications for this visit.  Imaging Results (Last 48 hours)  No results found.     Review of Systems:   A ROS was performed including pertinent positives and negatives as documented in the HPI.     Musculoskeletal Exam:     There were no vitals taken for this visit.   Surgical incisions are well-appearing without erythema or drainage.  Wound edges remained well-approximated after staple removal.  Distal neurosensory exam intact.   Imaging:   Xray (left femur 2 views): Intramedullary nail in good positioning with improved alignment of subtrochanteric fracture.     I personally reviewed and interpreted the radiographs.     Assessment:   Status post IM nailing of the left femur due to a periprosthetic subtrochanteric fracture.  Overall she is doing extremely well and has not gotten significant strength back on this left side.  I will plan to see her back as needed Plan :     -Return to clinic as needed         I personally saw and evaluated the patient, and participated in the management and treatment plan.

## 2023-11-11 ENCOUNTER — Inpatient Hospital Stay: Attending: Hematology and Oncology | Admitting: Hematology and Oncology

## 2023-11-11 DIAGNOSIS — C50411 Malignant neoplasm of upper-outer quadrant of right female breast: Secondary | ICD-10-CM | POA: Diagnosis not present

## 2023-11-11 DIAGNOSIS — Z170 Estrogen receptor positive status [ER+]: Secondary | ICD-10-CM | POA: Diagnosis not present

## 2023-11-11 MED ORDER — LETROZOLE 2.5 MG PO TABS: 2.5000 mg | ORAL_TABLET | Freq: Every day | ORAL | 3 refills | Status: DC

## 2023-11-11 NOTE — Progress Notes (Signed)
 HEMATOLOGY-ONCOLOGY TELEPHONE VISIT PROGRESS NOTE  I connected with our patient on 11/11/23 at 10:45 AM EDT by telephone and verified that I am speaking with the correct person using two identifiers.  I discussed the limitations, risks, security and privacy concerns of performing an evaluation and management service by telephone and the availability of in person appointments.  I also discussed with the patient that there may be a patient responsible charge related to this service. The patient expressed understanding and agreed to proceed.   History of Present Illness:   History of Present Illness [name] is a 68 year old female with estrogen receptor-positive breast cancer who presents for follow-up on her letrozole  treatment and bone health.  She has been on letrozole , initially stopping due to severe side effects but resuming it with nighttime dosing to manage fatigue, her primary concern. Her appetite decreased but improved after stopping prednisone . She is on her last refill of letrozole . She takes calcium  and vitamin D3 daily to support bone health. Post-surgery anemia has resolved, and she no longer requires iron infusions.    Oncology History  Breast cancer of upper-outer quadrant of right female breast (HCC)  12/16/2013 Mammogram   1. A palpable 4.1 cm diameter mass/asymmetry with associated coarse heterogeneous microcalcifications at 11 o'clock 2 cm from the right nipple demonstrates mammographic and sonographic features highly suggestive of malignancy.    12/16/2013 Initial Biopsy   Breast cancer of upper-outer quadrant of right female breast; Invasive Ductal Cancer Er 100%, PR: 0%; Her2 Positive Ratio 4.28: Ki 67:24%;    12/23/2013 Breast MRI   Right Breast 4.4 x 3 x 2.5 cm and a sub cm satellite lesion. No LN   01/28/2014 - 05/27/2014 Neo-Adjuvant Chemotherapy   TC HP x2 cycles ( developed hand-foot syndrome) GC HP x1 cycle (developed dehydration and renal failure) CHP from  04/08/2014   07/21/2014 - 09/01/2014 Chemotherapy   Herceptin  maintenance therapy   07/28/2014 Surgery   Right breast mastectomy: DCIS 7 cm, 4 lymph nodes negative, no invasive breast cancer was identified, complete pathologic response   08/19/2014 - 04/20/2015 Anti-estrogen oral therapy   Anastrozole  1 mg daily stopped 09/23/2014 due to diffuse body aches, diarrhea, nausea, fatigue we switched her to tamoxifen  in 03/22/2015 stopped after 2 weeks with diarrhea, tiredness and nausea     REVIEW OF SYSTEMS:   Constitutional: Denies fevers, chills or abnormal weight loss All other systems were reviewed with the patient and are negative. 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 Status post neoadjuvant chemotherapy with TCHPx2, GCHPx1, CHPx1, HPx2 , complete pathologic response with only residual DCIS 7 cm remaining. Anastrozole  started 08/19/2014 stopped 09/18/2014, Herceptin  stopped April 2016    PET CT scan 10/28/2022: Hypermetabolic left upper lobe mass (3.1 x 4.6 cm) with hypermetabolic nodules in the right hemithorax (11 mm) and hypermetabolic ipsilateral mediastinal lymph node (AP window lymph node 5 mm). 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 Shelah.  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   Current treatment: Letrozole   Letrozole  toxicities: tolerating it well   Pain issues: Going to see pain management History of  left hip fracture Parotid gland mass: Patient does not desire for surgery.  We will do scans to see if there is any evidence of progression. Rheumatoid arthritis and severe osteoarthritis   Microcytic anemia: Iron saturation 10%, hemoglobin 10.6 on 10/16/2023. I did not recommend IV iron  10/29/2023: CT CAP: Significantly diminished consolidation left upper lobe previously seen subpleural nodule not measurable, unchanged right parotid mass 1.2 cm, emphysema, hepatic steatosis  05/21/2023: Postop sup trochanteric left femur fracture ORIF Osteoporosis: Recommend Zometa infusion annually we will set that up for next month. Assessment & Plan Malignant neoplasm of breast Estrogen receptor positive breast cancer in the upper-outer quadrant of the right breast. Significant lung improvement on CT, stable parotid mass. Fatigue likely due to letrozole . - Refill letrozole  prescription at [facility] on [address]. - Discuss letrozole  dosage adjustment options, including alternate day dosing or dose reduction.  Osteoporosis Osteoporosis with fracture history. Calcium  and vitamin D3 supplementation ongoing. Plan to initiate Zometa for bone health enhancement. - Continue calcium  and vitamin D3 supplementation. - Schedule Zometa infusion in three months.      I discussed the assessment and treatment plan with the patient. The patient was provided an opportunity to ask questions and all were answered. The patient agreed with the plan and demonstrated an understanding of the instructions. The patient was advised to call back or seek an in-person evaluation if the symptoms worsen or if the condition fails to improve as anticipated.   I provided 20 minutes of non-face-to-face time during this encounter.  This includes time for charting and coordination of care   Naomi MARLA Chad, MD

## 2023-11-11 NOTE — Assessment & Plan Note (Signed)
 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 Status post neoadjuvant chemotherapy with TCHPx2, GCHPx1, CHPx1, HPx2 , complete pathologic response with only residual DCIS 7 cm remaining. Anastrozole  started 08/19/2014 stopped 09/18/2014, Herceptin  stopped April 2016    PET CT scan 10/28/2022: Hypermetabolic left upper lobe mass (3.1 x 4.6 cm) with hypermetabolic nodules in the right hemithorax (11 mm) and hypermetabolic ipsilateral mediastinal lymph node (AP window lymph node 5 mm). 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 Shelah.  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   Current treatment: Letrozole  Letrozole  toxicities: tolerating it well   Pain issues: Going to see pain management History of left hip fracture Parotid gland mass: Patient does not desire for surgery.  We will do scans to see if there is any evidence of progression. Rheumatoid arthritis and severe osteoarthritis   Microcytic anemia: Iron studies are pending.  Today's hemoglobin is 10.6.  10/29/2023: CT CAP: Significantly diminished consolidation left upper lobe previously seen subpleural nodule not measurable, unchanged right parotid mass 1.2 cm, emphysema, hepatic steatosis  11/04/2023: Postop sup trochanteric left femur fracture ORIF

## 2023-11-13 ENCOUNTER — Encounter (HOSPITAL_BASED_OUTPATIENT_CLINIC_OR_DEPARTMENT_OTHER): Payer: Self-pay | Admitting: Orthopaedic Surgery

## 2023-11-14 ENCOUNTER — Other Ambulatory Visit: Payer: Self-pay | Admitting: Hematology and Oncology

## 2023-12-11 ENCOUNTER — Inpatient Hospital Stay

## 2023-12-11 ENCOUNTER — Inpatient Hospital Stay: Admitting: Hematology and Oncology

## 2023-12-15 ENCOUNTER — Other Ambulatory Visit: Payer: Self-pay | Admitting: Cardiology

## 2023-12-23 ENCOUNTER — Telehealth: Payer: Self-pay

## 2023-12-23 ENCOUNTER — Other Ambulatory Visit: Payer: Self-pay | Admitting: *Deleted

## 2023-12-23 DIAGNOSIS — C50411 Malignant neoplasm of upper-outer quadrant of right female breast: Secondary | ICD-10-CM

## 2023-12-23 NOTE — Telephone Encounter (Signed)
 Left message to confirm appts for 8/6

## 2023-12-24 ENCOUNTER — Inpatient Hospital Stay: Attending: Hematology and Oncology

## 2023-12-24 ENCOUNTER — Inpatient Hospital Stay: Admitting: Hematology and Oncology

## 2023-12-24 ENCOUNTER — Telehealth: Payer: Self-pay | Admitting: Dietician

## 2023-12-24 ENCOUNTER — Encounter: Payer: Self-pay | Admitting: Hematology and Oncology

## 2023-12-24 ENCOUNTER — Inpatient Hospital Stay (HOSPITAL_BASED_OUTPATIENT_CLINIC_OR_DEPARTMENT_OTHER): Admitting: Hematology and Oncology

## 2023-12-24 ENCOUNTER — Inpatient Hospital Stay

## 2023-12-24 VITALS — BP 137/65 | HR 57 | Temp 98.6°F | Resp 15 | Wt 149.9 lb

## 2023-12-24 DIAGNOSIS — Z170 Estrogen receptor positive status [ER+]: Secondary | ICD-10-CM | POA: Insufficient documentation

## 2023-12-24 DIAGNOSIS — Z79811 Long term (current) use of aromatase inhibitors: Secondary | ICD-10-CM | POA: Diagnosis not present

## 2023-12-24 DIAGNOSIS — M810 Age-related osteoporosis without current pathological fracture: Secondary | ICD-10-CM

## 2023-12-24 DIAGNOSIS — M069 Rheumatoid arthritis, unspecified: Secondary | ICD-10-CM | POA: Insufficient documentation

## 2023-12-24 DIAGNOSIS — Z1722 Progesterone receptor negative status: Secondary | ICD-10-CM | POA: Diagnosis not present

## 2023-12-24 DIAGNOSIS — Z79899 Other long term (current) drug therapy: Secondary | ICD-10-CM | POA: Insufficient documentation

## 2023-12-24 DIAGNOSIS — C50411 Malignant neoplasm of upper-outer quadrant of right female breast: Secondary | ICD-10-CM

## 2023-12-24 DIAGNOSIS — Z1731 Human epidermal growth factor receptor 2 positive status: Secondary | ICD-10-CM | POA: Diagnosis not present

## 2023-12-24 DIAGNOSIS — Z9011 Acquired absence of right breast and nipple: Secondary | ICD-10-CM | POA: Diagnosis not present

## 2023-12-24 LAB — CMP (CANCER CENTER ONLY)
ALT: 5 U/L (ref 0–44)
AST: 11 U/L — ABNORMAL LOW (ref 15–41)
Albumin: 4 g/dL (ref 3.5–5.0)
Alkaline Phosphatase: 103 U/L (ref 38–126)
Anion gap: 8 (ref 5–15)
BUN: 14 mg/dL (ref 8–23)
CO2: 24 mmol/L (ref 22–32)
Calcium: 8.6 mg/dL — ABNORMAL LOW (ref 8.9–10.3)
Chloride: 110 mmol/L (ref 98–111)
Creatinine: 0.66 mg/dL (ref 0.44–1.00)
GFR, Estimated: >60 mL/min (ref >60)
Glucose, Bld: 94 mg/dL (ref 70–99)
Potassium: 3.2 mmol/L — ABNORMAL LOW (ref 3.5–5.1)
Sodium: 142 mmol/L (ref 135–145)
Total Bilirubin: 0.4 mg/dL (ref 0.0–1.2)
Total Protein: 7.4 g/dL (ref 6.5–8.1)

## 2023-12-24 LAB — CBC WITH DIFFERENTIAL (CANCER CENTER ONLY)
Abs Immature Granulocytes: 0.01 K/uL (ref 0.00–0.07)
Basophils Absolute: 0 K/uL (ref 0.0–0.1)
Basophils Relative: 0 %
Eosinophils Absolute: 0.2 K/uL (ref 0.0–0.5)
Eosinophils Relative: 3 %
HCT: 33.4 % — ABNORMAL LOW (ref 36.0–46.0)
Hemoglobin: 11.1 g/dL — ABNORMAL LOW (ref 12.0–15.0)
Immature Granulocytes: 0 %
Lymphocytes Relative: 32 %
Lymphs Abs: 2.2 K/uL (ref 0.7–4.0)
MCH: 25.6 pg — ABNORMAL LOW (ref 26.0–34.0)
MCHC: 33.2 g/dL (ref 30.0–36.0)
MCV: 77 fL — ABNORMAL LOW (ref 80.0–100.0)
Monocytes Absolute: 0.4 K/uL (ref 0.1–1.0)
Monocytes Relative: 6 %
Neutro Abs: 4.2 K/uL (ref 1.7–7.7)
Neutrophils Relative %: 59 %
Platelet Count: 257 K/uL (ref 150–400)
RBC: 4.34 MIL/uL (ref 3.87–5.11)
RDW: 16.2 % — ABNORMAL HIGH (ref 11.5–15.5)
WBC Count: 7 K/uL (ref 4.0–10.5)
nRBC: 0 % (ref 0.0–0.2)

## 2023-12-24 MED ORDER — SODIUM CHLORIDE 0.9% FLUSH
10.0000 mL | Freq: Once | INTRAVENOUS | Status: AC | PRN
Start: 2023-12-24 — End: 2023-12-24
  Administered 2023-12-24: 10 mL

## 2023-12-24 MED ORDER — ZOLEDRONIC ACID 4 MG/100ML IV SOLN
4.0000 mg | Freq: Once | INTRAVENOUS | Status: AC
  Administered 2023-12-24: 4 mg via INTRAVENOUS
  Filled 2023-12-24: qty 100

## 2023-12-24 NOTE — Telephone Encounter (Signed)
 Patient screened on MST. First attempt to reach. Provided my cell# on voice mail to return call to set up a nutrition consult.  Gennaro Africa, RDN, LDN Registered Dietitian, [facility] Part Time Remote (Usual office hours: Tuesday-Thursday) Cell: [phone]

## 2023-12-24 NOTE — Patient Instructions (Signed)
 Zoledronic Acid Injection  What is this medication? ZOLEDRONIC ACID (ZOE le dron ik AS id) treats high calcium levels in the blood caused by cancer. It may also be used with chemotherapy to treat weakened bones caused by cancer. It works by slowing down the release of calcium from bones. This lowers calcium levels in your blood. It also makes your bones stronger and less likely to break (fracture). It belongs to a group of medications called bisphosphonates. This medicine may be used for other purposes; ask your health care provider or pharmacist if you have questions. COMMON BRAND NAME(S): Zometa, Zometa Powder What should I tell my care team before I take this medication? They need to know if you have any of these conditions: Dehydration Dental disease Kidney disease Liver disease Low levels of calcium in the blood Lung or breathing disease, such as asthma Receiving steroids, such as dexamethasone or prednisone An unusual or allergic reaction to zoledronic acid, other medications, foods, dyes, or preservatives Pregnant or trying to get pregnant Breast-feeding How should I use this medication? This medication is injected into a vein. It is given by your care team in a hospital or clinic setting. Talk to your care team about the use of this medication in children. Special care may be needed. Overdosage: If you think you have taken too much of this medicine contact a poison control center or emergency room at once. NOTE: This medicine is only for you. Do not share this medicine with others. What if I miss a dose? Keep appointments for follow-up doses. It is important not to miss your dose. Call your care team if you are unable to keep an appointment. What may interact with this medication? Certain antibiotics given by injection Diuretics, such as bumetanide, furosemide NSAIDs, medications for pain and inflammation, such as ibuprofen or naproxen Teriparatide Thalidomide This list may not  describe all possible interactions. Give your health care provider a list of all the medicines, herbs, non-prescription drugs, or dietary supplements you use. Also tell them if you smoke, drink alcohol, or use illegal drugs. Some items may interact with your medicine. What should I watch for while using this medication? Visit your care team for regular checks on your progress. It may be some time before you see the benefit from this medication. Some people who take this medication have severe bone, joint, or muscle pain. This medication may also increase your risk for jaw problems or a broken thigh bone. Tell your care team right away if you have severe pain in your jaw, bones, joints, or muscles. Tell you care team if you have any pain that does not go away or that gets worse. Tell your dentist and dental surgeon that you are taking this medication. You should not have major dental surgery while on this medication. See your dentist to have a dental exam and fix any dental problems before starting this medication. Take good care of your teeth while on this medication. Make sure you see your dentist for regular follow-up appointments. You should make sure you get enough calcium and vitamin D while you are taking this medication. Discuss the foods you eat and the vitamins you take with your care team. Check with your care team if you have severe diarrhea, nausea, and vomiting, or if you sweat a lot. The loss of too much body fluid may make it dangerous for you to take this medication. You may need bloodwork while taking this medication. Talk to your care team if  you wish to become pregnant or think you might be pregnant. This medication can cause serious birth defects. What side effects may I notice from receiving this medication? Side effects that you should report to your care team as soon as possible: Allergic reactions--skin rash, itching, hives, swelling of the face, lips, tongue, or throat Kidney  injury--decrease in the amount of urine, swelling of the ankles, hands, or feet Low calcium level--muscle pain or cramps, confusion, tingling, or numbness in the hands or feet Osteonecrosis of the jaw--pain, swelling, or redness in the mouth, numbness of the jaw, poor healing after dental work, unusual discharge from the mouth, visible bones in the mouth Severe bone, joint, or muscle pain Side effects that usually do not require medical attention (report to your care team if they continue or are bothersome): Constipation Fatigue Fever Loss of appetite Nausea Stomach pain This list may not describe all possible side effects. Call your doctor for medical advice about side effects. You may report side effects to FDA at 1-800-FDA-1088. Where should I keep my medication? This medication is given in a hospital or clinic. It will not be stored at home. NOTE: This sheet is a summary. It may not cover all possible information. If you have questions about this medicine, talk to your doctor, pharmacist, or health care provider.  2024 Elsevier/Gold Standard (2021-06-29 00:00:00)

## 2023-12-24 NOTE — Progress Notes (Signed)
 History of Present Illness:   History of Present Illness   [name] is a 68 year old female with breast cancer and rheumatoid arthritis who presents with chronic pain and fatigue.  She experiences chronic pain described as 'terrible' and 'all over,' particularly affecting her lower back, hips, and shoulders. The pain is constant and not relieved by rest. She attributes some of the pain to her rheumatoid arthritis and notes that letrozole  may be contributing to the pain, especially in her hands and arms. She has been using a wheelchair for the past eight to nine months.  Her breast cancer was diagnosed in July 2015, treated with chemotherapy, a right mastectomy, and initially with anastrozole  and tamoxifen , which were discontinued due to side effects. In 2024, a CT scan and PET scan were performed due to concerns about lung nodules, which were biopsied and found to be benign. Despite this, she was started on letrozole  due to concerns about potential cancer recurrence. She has been on letrozole  for over a year, which she reports causes fatigue, bone pain, and a lack of appetite.  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 She also mentions a history of fibromyalgia and spondylitis contributing to her chronic pain.  She has a history of a broken jawbone from when she was 68 years old, which was wired for six months. She has not visited a dentist in over 20 years, despite having several teeth with fallen fillings. She is reluctant to visit the dentist due to potential costs and the extent of dental work needed.  In the review of symptoms, she reports fatigue not relieved by rest, mild anxiety, and generalized pain. No new dental issues but acknowledges the need for a dental check-up due to her current medication regimen.    Oncology History  Breast cancer of upper-outer quadrant of right female breast (HCC)  12/16/2013 Mammogram    1. A palpable 4.1 cm diameter mass/asymmetry with associated coarse heterogeneous microcalcifications at 11 o'clock 2 cm from the right nipple demonstrates mammographic and sonographic features highly suggestive of malignancy.    12/16/2013 Initial Biopsy   Breast cancer of upper-outer quadrant of right female breast; Invasive Ductal Cancer Er 100%, PR: 0%; Her2 Positive Ratio 4.28: Ki 67:24%;    12/23/2013 Breast MRI   Right Breast 4.4 x 3 x 2.5 cm and a sub cm satellite lesion. No LN   01/28/2014 - 05/27/2014 Neo-Adjuvant Chemotherapy   TC HP x2 cycles ( developed hand-foot syndrome) GC HP x1 cycle (developed dehydration and renal failure) CHP from 04/08/2014   07/21/2014 - 09/01/2014 Chemotherapy   Herceptin  maintenance therapy   07/28/2014 Surgery   Right breast mastectomy: DCIS 7 cm, 4 lymph nodes negative, no invasive breast cancer was identified, complete pathologic response   08/19/2014 - 04/20/2015 Anti-estrogen oral therapy   Anastrozole  1 mg daily stopped 09/23/2014 due to diffuse body aches, diarrhea, nausea, fatigue we switched her to tamoxifen  in 03/22/2015 stopped after 2 weeks with diarrhea, tiredness and nausea     BP 137/65 (BP Location: Left Arm)   Pulse (!) 57   Temp 98.6 F (37 C) (Temporal)   Resp 15   Wt 149 lb 14.4 oz (68 kg)   SpO2 99%   BMI 25.73 kg/m   PE deferred in lieu of counseling.    Assessment Plan:  Breast cancer of upper-outer quadrant of right female breast (HCC) Right breast invasive ductal carcinoma 4.4 cm  in size with a satellite lesion based on MRI result there were no abnormal lymph nodes ER 100% positive PR 0% HER-2/neu positive (HER-2/CEP 17 ratio 4.28) grade 3 T2, N0, M0 clinical stage IIa. Status post neoadjuvant chemotherapy with TCHPx2, GCHPx1, CHPx1, HPx2 , complete pathologic response with only residual DCIS 7 cm remaining. Anastrozole  started 08/19/2014 stopped 09/18/2014, Herceptin  stopped April 2016    PET CT scan 10/28/2022:  Hypermetabolic left upper lobe mass (3.1 x 4.6 cm) with hypermetabolic nodules in the right hemithorax (11 mm) and hypermetabolic ipsilateral mediastinal lymph node (AP window lymph node 5 mm). 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 Shelah.  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   Current treatment: Letrozole   Letrozole  toxicities: tolerating it well   Assessment & Plan Malignant neoplasm of breast Estrogen receptor positive breast cancer in the upper-outer quadrant of the right breast.  She was restarted on letrozole  by Dr Odean because of concern for breast cancer recurrence. No new concerns today. She will continue to FU with Dr Odean.  Chronic pain due to rheumatoid arthritis, fibromyalgia, and medication Chronic pain in lower back, hips, shoulders, and exacerbated by letrozole . RA and fibromyalgia contribute to pain. Letrozole  worsens hand and arm pain. RA not severe enough for deformities. - Discuss with Dr. Gudena potential adjustments to letrozole  therapy.  Fatigue (chronic) Chronic fatigue unrelieved by rest, likely multifactorial from letrozole , RA, and fibromyalgia. Reports lack of energy and difficulty with daily activities.  Osteoporosis, receiving Zometa  therapy Osteoporosis managed with Zometa . Concern for osteonecrosis of the jaw due to history of broken jawbone and lack of dental exam in over 20 years. - Administer Zometa  every six months. - Advise scheduling a dental exam to monitor for potential osteonecrosis of the jaw. - Instruct to inform dentist about Zometa  therapy before any dental procedures.   I discussed the assessment and treatment plan with the patient. The patient was provided an  opportunity to ask questions and all were answered. The patient agreed with the plan and demonstrated an understanding of the instructions. The patient was advised to call back or seek an in-person evaluation if the symptoms worsen or if the condition fails to improve as anticipated.   Time spent: 30 min  Amber Stalls, MD

## 2024-01-01 ENCOUNTER — Inpatient Hospital Stay: Admitting: Dietician

## 2024-01-01 NOTE — Progress Notes (Signed)
 Nutrition Assessment  Reached out to patient at home telephone #.  Reason for Assessment: MST screen for weight loss.    ASSESSMENT: Patient is a 68 year old female under surveillance for breast cancer and currently taking letrozole  for past year, which she reports contributes to her lack of appetite.  She was also recently weaned of Prednisone  and her appetite reduced even more. She was concerned when weighed last week with  10# weight loss.  She has PMHx that includes GERD, Fibromyalgia, Rheumatoid arthritis, and recent femur fractures.  She uses a wheel chair and admits to isolation and some depression.  Last three days making more of an effort to increase her intake with scrambled eggs, Minestrone soup, and cookies Sometimes uses Ensure HP likes the chocolate.   Anthropometrics: weight loss 21# (12.3%) past 6 month which is significant for timeframe  Height: 64 Weight: 149.8#  BMI: 25.73   NUTRITION DIAGNOSIS: Inadequate PO intake to meet increased nutrient needs, r/t anorexia with medications  INTERVENTION:   Educated on importance of adequate calorie and protein energy intake  with nutrient dense foods when possible to maintain weight/strength Encouraged at minimum 3 meals per day with protein pacing at mels and through out day.   Encouraged finding a place to monitor weight monthly Reviewed strategies for oral nutrition supplements and encouraged 350 calorie (20-30 g pro) as meal replacement, others as snack or between meal supplement. Discussed strategies for reducing inflammation with fatty fish and nuts, seeds, and legumes. Emailed Nutrition Tip sheet  for  Nutrition fro Breast Cancer Survivors, links for Mediterranean diet from [facility], and anti inflammatory diet on [employer]'s site, as well as Virtual Nutrition Class flyer with contact information provided.   MONITORING, EVALUATION, GOAL: weight, PO intake, Nutrition Impact Symptoms, labs Goal is weight  maintenance  Next Visit: PRN at patient or provider request  Micheline Craven, RDN, LDN Registered Dietitian, [facility] Part Time Remote (Usual office hours: Tuesday-Thursday) Cell: [phone]

## 2024-01-02 ENCOUNTER — Ambulatory Visit (INDEPENDENT_AMBULATORY_CARE_PROVIDER_SITE_OTHER)

## 2024-01-02 ENCOUNTER — Other Ambulatory Visit (HOSPITAL_BASED_OUTPATIENT_CLINIC_OR_DEPARTMENT_OTHER): Payer: Self-pay

## 2024-01-02 ENCOUNTER — Ambulatory Visit (HOSPITAL_BASED_OUTPATIENT_CLINIC_OR_DEPARTMENT_OTHER): Admitting: Orthopaedic Surgery

## 2024-01-02 DIAGNOSIS — M25512 Pain in left shoulder: Secondary | ICD-10-CM | POA: Diagnosis not present

## 2024-01-02 DIAGNOSIS — G8929 Other chronic pain: Secondary | ICD-10-CM

## 2024-01-02 DIAGNOSIS — M25511 Pain in right shoulder: Secondary | ICD-10-CM | POA: Diagnosis not present

## 2024-01-02 NOTE — Progress Notes (Signed)
 Follow-up       Interval History:    Presents today with bilateral shoulder pain which has been ongoing now for several years.  She is having pain with overhead motion.  She does experience crepitus in both shoulders.  She is having a hard time weightbearing with a walker as a result of the shoulder pain.  She cannot lay directly on either side.  She is doing well from a left hip perspective following cephalomedullary nailing   PMH/PSH/Family History/Social History/Meds/Allergies:         Past Medical History:  Diagnosis Date   Allergy      Phreesia 03/21/2020   Anemia      Phreesia 03/21/2020   Anxiety     Arthritis      Phreesia 03/21/2020   Blood transfusion without reported diagnosis      Phreesia 03/21/2020   Cancer (HCC)      right breast cancer   Depression     Depression      Phreesia 03/21/2020   Fibromyalgia     GERD (gastroesophageal reflux disease)      Phreesia 03/21/2020   Heavy smoker     Hepatitis C      Pt was given too much interferon - critical care x2d, 5 days total hosp   Neuromuscular disorder (HCC)      Phreesia 03/21/2020   Osteoporosis     Perimenopausal 11/18/2003    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), hypoglycemia 2/04-resolved with PO calcium  + D, SVT, was treated with iterferon and ribavirin-10/3-2/04   Personal history of chemotherapy     PUD (peptic ulcer disease)     Rheumatoid arthritis(714.0)      possible   SVT (supraventricular tachycardia) (HCC)     Wears glasses               Past Surgical History:  Procedure Laterality Date   ABDOMINAL HYSTERECTOMY       BREAST SURGERY N/A      Phreesia 03/21/2020   CESAREAN SECTION   09/17/1980   CESAREAN SECTION N/A      Phreesia 03/21/2020   COLONOSCOPY       DILATION AND CURETTAGE OF UTERUS       hysterectomy-for dysmenorrhea   09/17/2000   INTRAMEDULLARY (IM) NAIL INTERTROCHANTERIC Left 05/21/2023     Procedure: INTRAMEDULLARY (IM) NAIL INTERTROCHANTERIC WITH HARDWARE REMOVAL;  Surgeon: Genelle Standing, MD;  Location: [facility];  Service: Orthopedics;  Laterality: Left;   JOINT REPLACEMENT N/A      Phreesia 03/21/2020   MASTECTOMY Right 2016   MASTECTOMY W/ SENTINEL NODE BIOPSY Right 07/28/2014    Procedure: RIGHT MASTECTOMY WITH SENTINEL LYMPH NODE BIOPSY ;  Surgeon: Jina Nephew, MD;  Location: [facility];  Service: General;  Laterality: Right;   NASAL SINUS SURGERY   03/20/1984    for deviated septum   PORTACATH PLACEMENT Left 01/20/2014    Procedure: INSERTION PORT-A-CATH;  Surgeon: Jina Nephew, MD;  Location: [facility];  Service: General;  Laterality: Left;   TONSILLECTOMY   05/20/1961   UPPER GI ENDOSCOPY            Social History         Socioeconomic History   Marital status: Divorced      Spouse name: Not on file   Number of children: Not on file   Years of education: Not on file  Highest education level: Not on file  Occupational History   Occupation: disability  Tobacco Use   Smoking status: Every Day   Smokeless tobacco: Never   Tobacco comments:      Pt is currently vapping ARJ 12/06/22  Vaping Use   Vaping status: Every Day  Substance and Sexual Activity   Alcohol use: Not Currently      Comment: denies; reports 3-4 beers/night to Dr. 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     Hepatitis B & C    Has 2 dogs and 5 cats    Did inpatient substance abuse rehab in 1993              Social Drivers of  Health        Financial Resource Strain: Medium Risk (01/06/2023)    Received from [facility]    Overall Financial Resource Strain (CARDIA)     Difficulty of Paying Living Expenses: Somewhat hard  Food Insecurity: Patient Declined (05/22/2023)    Hunger Vital Sign     Worried About Running Out of Food in the Last Year: Patient declined     Ran Out of Food in the Last Year: Patient declined  Transportation Needs: Patient Declined (05/22/2023)    PRAPARE - Therapist, art (Medical): Patient declined     Lack of Transportation (Non-Medical): Patient declined  Recent Concern: Transportation Needs - Unmet Transportation Needs (03/25/2023)    Received from [facility] - Transportation     Lack of Transportation (Medical): Yes     Lack of Transportation (Non-Medical): Yes  Physical Activity: Insufficiently Active (01/06/2023)    Received from [facility]    Exercise Vital Sign     Days of Exercise per Week: 1 day     Minutes of Exercise per Session: 20 min  Stress: No Stress Concern Present (03/25/2023)    Received from [facility] of Occupational Health - Occupational Stress Questionnaire     Feeling of Stress : Only a little  Recent Concern: Stress - Stress Concern Present (01/06/2023)    Received from [facility] of Occupational Health - Occupational Stress Questionnaire     Feeling of Stress : Rather much  Social Connections: Patient Declined (05/22/2023)    Social Connection and Isolation Panel [NHANES]     Frequency of Communication with Friends and Family: Patient declined     Frequency of Social Gatherings with Friends and Family: Patient declined     Attends Religious Services: Patient declined     Database administrator or Organizations: Patient declined     Attends Banker Meetings: Patient declined     Marital Status: Patient declined         Family History  Problem Relation  Age of Onset   Dementia Mother     Hypertension Mother     Hyperlipidemia Mother  Cancer Father 68        lung   Dementia Father     Breast cancer Maternal Aunt     Cancer Paternal Uncle     Breast cancer Cousin     Colon cancer Neg Hx          Allergies       Allergies  Allergen Reactions   Pegfilgrastim  Hives      Blistering rash all over body   Codeine Itching and Nausea Only      Pt has had codeine with pre-administration to prevent reaction without any problem.   Other Reaction(s): nausea and vomiting, itching            Current Outpatient Medications  Medication Sig Dispense Refill   acetaminophen  (TYLENOL ) 325 MG tablet Take 2 tablets (650 mg total) by mouth every 6 (six) hours as needed for mild pain (pain score 1-3), fever or headache.       ALPRAZolam  (XANAX ) 0.5 MG tablet Take 1 tablet (0.5 mg total) by mouth 2 (two) times daily as needed for anxiety. 10 tablet 0   aspirin  325 MG tablet Take 1 tablet (325 mg total) by mouth daily.       atenolol  (TENORMIN ) 50 MG tablet Take 1 tablet (50 mg total) by mouth daily. TAKE 1 TABLET(50 MG) BY MOUTH TWICE DAILY       B Complex-C (B-COMPLEX WITH VITAMIN C) tablet Take 1 tablet by mouth 2 (two) times a week.       famotidine  (PEPCID ) 40 MG tablet Take 40 mg by mouth at bedtime.       fluticasone  (FLONASE ) 50 MCG/ACT nasal spray Place 2 sprays into both nostrils daily. 16 g 6   gabapentin  (NEURONTIN ) 300 MG capsule Take 1 capsule (300 mg total) by mouth 2 (two) times daily.       letrozole  (FEMARA ) 2.5 MG tablet Take 2.5 mg by mouth daily.       melatonin 5 MG TABS Take 1 tablet (5 mg total) by mouth at bedtime as needed.       Misc Natural Products (TUMERSAID) TABS as directed Orally       Multiple Vitamins tablet Take by mouth.       Multiple Vitamins-Minerals (WOMENS MULTIVITAMIN PO) Take 1 tablet by mouth 3 (three) times a week.       ondansetron  (ZOFRAN ) 8 MG tablet Take 1 tablet (8 mg total) by mouth every 8 (eight)  hours as needed for nausea or vomiting. (Patient not taking: Reported on 03/20/2023) 30 tablet 3   Oxycodone  HCl 10 MG TABS Take 1 tablet (10 mg total) by mouth every 6 (six) hours as needed for up to 14 days. 56 tablet 0   pantoprazole  (PROTONIX ) 40 MG tablet Take 40 mg by mouth 2 (two) times daily.       polyethylene glycol (MIRALAX  / GLYCOLAX ) 17 g packet Take 17 g by mouth daily as needed for moderate constipation.       predniSONE  (DELTASONE ) 10 MG tablet TAKE 1 TABLET(10 MG) BY MOUTH DAILY WITH BREAKFAST 90 tablet 0   sertraline  (ZOLOFT ) 50 MG tablet Take 25 mg by mouth at bedtime.       Tiotropium Bromide-Olodaterol (STIOLTO RESPIMAT ) 2.5-2.5 MCG/ACT AERS Inhale 2 puffs into the lungs daily. (Patient not taking: Reported on 03/20/2023) 1 each 5   Turmeric (QC TUMERIC COMPLEX PO) Take 1 tablet by mouth daily.       Vitamin D , Ergocalciferol , (DRISDOL ) 1.25  MG (50000 UNIT) CAPS capsule Take 1 capsule (50,000 Units total) by mouth every 7 (seven) days.          No current facility-administered medications for this visit.      Imaging Results (Last 48 hours)  No results found.     Review of Systems:   A ROS was performed including pertinent positives and negatives as documented in the HPI.     Musculoskeletal Exam:     There were no vitals taken for this visit.   Bilateral shoulder forward elevation is to 90 degrees with external rotation to 20 degrees.  Internal rotation is to back pocket.  Distal neurosensory exam is intact   Imaging:   Xray (3 views right shoulder, 3 views left shoulder): Proximal migration of the humeral head consistent with rotator cuff arthropathy     I personally reviewed and interpreted the radiographs.     Assessment:   68 year old female with evidence of bilateral rotator cuff arthropathy which is limiting her ability to weight-bear with walker.  I did recommend ultrasound-guided injections of bilateral shoulders.  Given the fact that she has  dependent on the upper body for weightbearing with a walker I did recommend bilateral MRIs so that we can confirm rotator cuff arthropathy.  Will plan to proceed with this and I will see her back Plan :     - Bilateral ultrasound-guided injection performed verbal consent obtained         I personally saw and evaluated the patient, and participated in the management and treatment plan.

## 2024-01-11 ENCOUNTER — Encounter: Payer: Self-pay | Admitting: Hematology and Oncology

## 2024-01-20 ENCOUNTER — Encounter (HOSPITAL_BASED_OUTPATIENT_CLINIC_OR_DEPARTMENT_OTHER): Payer: Self-pay | Admitting: Orthopaedic Surgery

## 2024-01-24 ENCOUNTER — Ambulatory Visit
Admission: RE | Admit: 2024-01-24 | Discharge: 2024-01-24 | Disposition: A | Discharge status: Home / Self Care | Source: Ambulatory Visit | Attending: Orthopaedic Surgery | Admitting: Orthopaedic Surgery

## 2024-01-24 ENCOUNTER — Ambulatory Visit
Admission: RE | Admit: 2024-01-24 | Discharge: 2024-01-24 | Disposition: A | Discharge status: Home / Self Care | Source: Ambulatory Visit | Attending: Orthopaedic Surgery

## 2024-01-24 DIAGNOSIS — G8929 Other chronic pain: Secondary | ICD-10-CM

## 2024-01-27 ENCOUNTER — Other Ambulatory Visit (HOSPITAL_BASED_OUTPATIENT_CLINIC_OR_DEPARTMENT_OTHER): Payer: Self-pay | Admitting: Student

## 2024-01-27 ENCOUNTER — Encounter (HOSPITAL_BASED_OUTPATIENT_CLINIC_OR_DEPARTMENT_OTHER): Payer: Self-pay | Admitting: Orthopaedic Surgery

## 2024-01-27 MED ORDER — MELOXICAM 15 MG PO TABS: 15.0000 mg | ORAL_TABLET | Freq: Every day | ORAL | 0 refills | Status: AC

## 2024-01-27 MED ORDER — TRAMADOL HCL 50 MG PO TABS: 50.0000 mg | ORAL_TABLET | Freq: Four times a day (QID) | ORAL | 0 refills | Status: AC | PRN

## 2024-01-29 ENCOUNTER — Encounter: Payer: Self-pay | Admitting: Hematology and Oncology

## 2024-01-29 ENCOUNTER — Ambulatory Visit (HOSPITAL_BASED_OUTPATIENT_CLINIC_OR_DEPARTMENT_OTHER): Payer: Self-pay | Admitting: Orthopaedic Surgery

## 2024-01-29 ENCOUNTER — Other Ambulatory Visit (HOSPITAL_BASED_OUTPATIENT_CLINIC_OR_DEPARTMENT_OTHER): Payer: Self-pay

## 2024-01-29 ENCOUNTER — Encounter (HOSPITAL_BASED_OUTPATIENT_CLINIC_OR_DEPARTMENT_OTHER): Payer: Self-pay

## 2024-01-29 DIAGNOSIS — G8929 Other chronic pain: Secondary | ICD-10-CM

## 2024-01-29 DIAGNOSIS — M25511 Pain in right shoulder: Secondary | ICD-10-CM

## 2024-01-29 MED ORDER — ACETAMINOPHEN 500 MG PO TABS
500.0000 mg | ORAL_TABLET | Freq: Three times a day (TID) | ORAL | 0 refills | Status: AC
  Filled 2024-01-29: qty 30, 10d supply, fill #0

## 2024-01-29 MED ORDER — OXYCODONE HCL 5 MG PO TABS
5.0000 mg | ORAL_TABLET | ORAL | 0 refills | Status: DC | PRN
  Filled 2024-01-29: qty 15, 3d supply, fill #0

## 2024-01-29 MED ORDER — ASPIRIN 325 MG PO TBEC
325.0000 mg | DELAYED_RELEASE_TABLET | Freq: Every day | ORAL | 0 refills | Status: DC
  Filled 2024-01-29: qty 14, 14d supply, fill #0

## 2024-01-29 NOTE — Progress Notes (Signed)
 Follow-up       Interval History:   01/29/2024: Presents today for follow-up bilateral shoulder MRIs.   Presents today with bilateral shoulder pain which has been ongoing now for several years.  She is having pain with overhead motion.  She does experience crepitus in both shoulders.  She is having a hard time weightbearing with a walker as a result of the shoulder pain.  She cannot lay directly on either side.  She is doing well from a left hip perspective following cephalomedullary nailing   PMH/PSH/Family History/Social History/Meds/Allergies:         Past Medical History:  Diagnosis Date   Allergy      Phreesia 03/21/2020   Anemia      Phreesia 03/21/2020   Anxiety     Arthritis      Phreesia 03/21/2020   Blood transfusion without reported diagnosis      Phreesia 03/21/2020   Cancer (HCC)      right breast cancer   Depression     Depression      Phreesia 03/21/2020   Fibromyalgia     GERD (gastroesophageal reflux disease)      Phreesia 03/21/2020   Heavy smoker     Hepatitis C      Pt was given too much interferon - critical care x2d, 5 days total hosp   Neuromuscular disorder (HCC)      Phreesia 03/21/2020   Osteoporosis     Perimenopausal 11/18/2003    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), hypoglycemia 2/04-resolved with PO calcium  + D, SVT, was treated with iterferon and ribavirin-10/3-2/04   Personal history of chemotherapy     PUD (peptic ulcer disease)     Rheumatoid arthritis(714.0)      possible   SVT (supraventricular tachycardia) (HCC)     Wears glasses               Past Surgical History:  Procedure Laterality Date   ABDOMINAL HYSTERECTOMY       BREAST SURGERY N/A      Phreesia 03/21/2020   CESAREAN SECTION   09/17/1980   CESAREAN SECTION N/A      Phreesia 03/21/2020   COLONOSCOPY       DILATION AND CURETTAGE OF UTERUS       hysterectomy-for dysmenorrhea    09/17/2000   INTRAMEDULLARY (IM) NAIL INTERTROCHANTERIC Left 05/21/2023    Procedure: INTRAMEDULLARY (IM) NAIL INTERTROCHANTERIC WITH HARDWARE REMOVAL;  Surgeon: Genelle Standing, MD;  Location: [facility];  Service: Orthopedics;  Laterality: Left;   JOINT REPLACEMENT N/A      Phreesia 03/21/2020   MASTECTOMY Right 2016   MASTECTOMY W/ SENTINEL NODE BIOPSY Right 07/28/2014    Procedure: RIGHT MASTECTOMY WITH SENTINEL LYMPH NODE BIOPSY ;  Surgeon: Jina Nephew, MD;  Location: [facility];  Service: General;  Laterality: Right;   NASAL SINUS SURGERY   03/20/1984    for deviated septum   PORTACATH PLACEMENT Left 01/20/2014    Procedure: INSERTION PORT-A-CATH;  Surgeon: Jina Nephew, MD;  Location: [facility];  Service: General;  Laterality: Left;   TONSILLECTOMY   05/20/1961   UPPER GI ENDOSCOPY            Social History         Socioeconomic History   Marital status: Divorced      Spouse name: Not on file   Number of children: Not on file  Years of education: Not on file   Highest education level: Not on file  Occupational History   Occupation: disability  Tobacco Use   Smoking status: Every Day   Smokeless tobacco: Never   Tobacco comments:      Pt is currently vapping ARJ 12/06/22  Vaping Use   Vaping status: Every Day  Substance and Sexual Activity   Alcohol use: Not Currently      Comment: denies; reports 3-4 beers/night to Dr. 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     Hepatitis B & C    Has 2 dogs and 5 cats    Did  inpatient substance abuse rehab in 1993              Social Drivers of Health        Financial Resource Strain: Medium Risk (01/06/2023)    Received from [facility]    Overall Financial Resource Strain (CARDIA)     Difficulty of Paying Living Expenses: Somewhat hard  Food Insecurity: Patient Declined (05/22/2023)    Hunger Vital Sign     Worried About Running Out of Food in the Last Year: Patient declined     Ran Out of Food in the Last Year: Patient declined  Transportation Needs: Patient Declined (05/22/2023)    PRAPARE - Therapist, art (Medical): Patient declined     Lack of Transportation (Non-Medical): Patient declined  Recent Concern: Transportation Needs - Unmet Transportation Needs (03/25/2023)    Received from [facility] - Transportation     Lack of Transportation (Medical): Yes     Lack of Transportation (Non-Medical): Yes  Physical Activity: Insufficiently Active (01/06/2023)    Received from [facility]    Exercise Vital Sign     Days of Exercise per Week: 1 day     Minutes of Exercise per Session: 20 min  Stress: No Stress Concern Present (03/25/2023)    Received from [facility] - Occupational Stress Questionnaire     Feeling of Stress : Only a little  Recent Concern: Stress - Stress Concern Present (01/06/2023)    Received from [facility] - Occupational Stress Questionnaire     Feeling of Stress : Rather much  Social Connections: Patient Declined (05/22/2023)    Social Connection and Isolation Panel [NHANES]     Frequency of Communication with Friends and Family: Patient declined     Frequency of Social Gatherings with Friends and Family: Patient declined     Attends Religious Services: Patient declined     Database administrator or Organizations: Patient declined     Attends Banker Meetings: Patient declined      Marital Status: Patient declined         Family History  Problem Relation Age of Onset   Dementia Mother     Hypertension Mother  Hyperlipidemia Mother     Cancer Father 71        lung   Dementia Father     Breast cancer Maternal Aunt     Cancer Paternal Uncle     Breast cancer Cousin     Colon cancer Neg Hx          Allergies       Allergies  Allergen Reactions   Pegfilgrastim  Hives      Blistering rash all over body   Codeine Itching and Nausea Only      Pt has had codeine with pre-administration to prevent reaction without any problem.   Other Reaction(s): nausea and vomiting, itching            Current Outpatient Medications  Medication Sig Dispense Refill   acetaminophen  (TYLENOL ) 325 MG tablet Take 2 tablets (650 mg total) by mouth every 6 (six) hours as needed for mild pain (pain score 1-3), fever or headache.       ALPRAZolam  (XANAX ) 0.5 MG tablet Take 1 tablet (0.5 mg total) by mouth 2 (two) times daily as needed for anxiety. 10 tablet 0   aspirin  325 MG tablet Take 1 tablet (325 mg total) by mouth daily.       atenolol  (TENORMIN ) 50 MG tablet Take 1 tablet (50 mg total) by mouth daily. TAKE 1 TABLET(50 MG) BY MOUTH TWICE DAILY       B Complex-C (B-COMPLEX WITH VITAMIN C) tablet Take 1 tablet by mouth 2 (two) times a week.       famotidine  (PEPCID ) 40 MG tablet Take 40 mg by mouth at bedtime.       fluticasone  (FLONASE ) 50 MCG/ACT nasal spray Place 2 sprays into both nostrils daily. 16 g 6   gabapentin  (NEURONTIN ) 300 MG capsule Take 1 capsule (300 mg total) by mouth 2 (two) times daily.       letrozole  (FEMARA ) 2.5 MG tablet Take 2.5 mg by mouth daily.       melatonin 5 MG TABS Take 1 tablet (5 mg total) by mouth at bedtime as needed.       Misc Natural Products (TUMERSAID) TABS as directed Orally       Multiple Vitamins tablet Take by mouth.       Multiple Vitamins-Minerals (WOMENS MULTIVITAMIN PO) Take 1 tablet by mouth 3 (three) times a week.        ondansetron  (ZOFRAN ) 8 MG tablet Take 1 tablet (8 mg total) by mouth every 8 (eight) hours as needed for nausea or vomiting. (Patient not taking: Reported on 03/20/2023) 30 tablet 3   Oxycodone  HCl 10 MG TABS Take 1 tablet (10 mg total) by mouth every 6 (six) hours as needed for up to 14 days. 56 tablet 0   pantoprazole  (PROTONIX ) 40 MG tablet Take 40 mg by mouth 2 (two) times daily.       polyethylene glycol (MIRALAX  / GLYCOLAX ) 17 g packet Take 17 g by mouth daily as needed for moderate constipation.       predniSONE  (DELTASONE ) 10 MG tablet TAKE 1 TABLET(10 MG) BY MOUTH DAILY WITH BREAKFAST 90 tablet 0   sertraline  (ZOLOFT ) 50 MG tablet Take 25 mg by mouth at bedtime.       Tiotropium Bromide-Olodaterol (STIOLTO RESPIMAT ) 2.5-2.5 MCG/ACT AERS Inhale 2 puffs into the lungs daily. (Patient not taking: Reported on 03/20/2023) 1 each 5   Turmeric (QC TUMERIC COMPLEX PO) Take 1 tablet by mouth daily.  Vitamin D , Ergocalciferol , (DRISDOL ) 1.25 MG (50000 UNIT) CAPS capsule Take 1 capsule (50,000 Units total) by mouth every 7 (seven) days.          No current facility-administered medications for this visit.      Imaging Results (Last 48 hours)  No results found.     Review of Systems:   A ROS was performed including pertinent positives and negatives as documented in the HPI.     Musculoskeletal Exam:     There were no vitals taken for this visit.   Bilateral shoulder forward elevation is to 90 degrees with external rotation to 20 degrees.  Internal rotation is to back pocket.  Distal neurosensory exam is intact   Imaging:   Xray (3 views right shoulder, 3 views left shoulder): Proximal migration of the humeral head consistent with rotator cuff arthropathy   MRI right shoulder, MRI left shoulder: Full-thickness rotator cuff tearing with significant retraction and atrophy on T1 view bilaterally   I personally reviewed and interpreted the radiographs.     Assessment:    68 year old female with evidence of bilateral rotator cuff arthropathy which is limiting her ability to weight-bear with walker.  She has now trialed bilateral shoulder ultrasound-guided injections in the subacromial space which did give her temporary relief but is subsequently worn off.  We did discuss that on MRI she does have evidence of bilateral full-thickness nonrepairable rotator cuff tears and her right shoulder is still continuing to bother her.  Given this we did discuss the role for possible right shoulder reverse shoulder arthroplasty.  I did discuss risk limitation as well as associated recovery timeframe.  After discussion she would like to proceed Plan :     - Plan for right shoulder reverse shoulder arthroplasty   After a lengthy discussion of treatment options, including risks, benefits, alternatives, complications of surgical and nonsurgical conservative options, the patient elected surgical repair.   The patient  is aware of the material risks  and complications including, but not limited to injury to adjacent structures, neurovascular injury, infection, numbness, bleeding, implant failure, thermal burns, stiffness, persistent pain, failure to heal, disease transmission from allograft, need for further surgery, dislocation, anesthetic risks, blood clots, risks of death,and others. The probabilities of surgical success and failure discussed with patient given their particular co-morbidities.The time and nature of expected rehabilitation and recovery was discussed.The patient's questions were all answered preoperatively.  No barriers to understanding were noted. I explained the natural history of the disease process and Rx rationale.  I explained to the patient what I considered to be reasonable expectations given their personal situation.  The final treatment plan was arrived at through a shared patient decision making process model.          I personally saw and evaluated the  patient, and participated in the management and treatment plan.

## 2024-02-03 ENCOUNTER — Encounter (HOSPITAL_BASED_OUTPATIENT_CLINIC_OR_DEPARTMENT_OTHER): Payer: Self-pay | Admitting: Orthopaedic Surgery

## 2024-02-03 ENCOUNTER — Encounter: Payer: Self-pay | Admitting: Hematology and Oncology

## 2024-02-05 ENCOUNTER — Ambulatory Visit
Admission: RE | Admit: 2024-02-05 | Discharge: 2024-02-05 | Disposition: A | Discharge status: Home / Self Care | Source: Ambulatory Visit | Attending: Orthopaedic Surgery | Admitting: Orthopaedic Surgery

## 2024-02-05 DIAGNOSIS — G8929 Other chronic pain: Secondary | ICD-10-CM

## 2024-02-09 ENCOUNTER — Encounter (HOSPITAL_BASED_OUTPATIENT_CLINIC_OR_DEPARTMENT_OTHER): Payer: Self-pay | Admitting: Orthopaedic Surgery

## 2024-02-10 ENCOUNTER — Other Ambulatory Visit: Payer: Self-pay | Admitting: Orthopaedic Surgery

## 2024-02-10 MED ORDER — TRAMADOL HCL 50 MG PO TABS: 50.0000 mg | ORAL_TABLET | Freq: Four times a day (QID) | ORAL | 0 refills | Status: DC | PRN

## 2024-02-12 ENCOUNTER — Ambulatory Visit (HOSPITAL_BASED_OUTPATIENT_CLINIC_OR_DEPARTMENT_OTHER): Admitting: Orthopaedic Surgery

## 2024-02-19 ENCOUNTER — Other Ambulatory Visit: Payer: Self-pay

## 2024-02-19 NOTE — Progress Notes (Signed)
 Patient called in today stating she went to her PCP to get a surgical clearance and she reported to the PCP that she has lost 40 lbs in the past 6 months without trying. PCP requested she follow up with Dr. Odean. Made Dr. Odean aware of PCP request and he agrees he needs to see the patient. Sent scheduling message high priority to [employer] scheduler to get her an OV and Lab app.

## 2024-02-25 ENCOUNTER — Telehealth: Payer: Self-pay

## 2024-02-25 ENCOUNTER — Encounter (HOSPITAL_BASED_OUTPATIENT_CLINIC_OR_DEPARTMENT_OTHER): Payer: Self-pay | Admitting: Orthopaedic Surgery

## 2024-02-25 NOTE — Telephone Encounter (Signed)
   Name: [name]  DOB: July 27, 1955  MRN: [id number]  Primary Cardiologist: Redell Shallow, MD   Preoperative team, please contact this patient and set up a phone call appointment for further preoperative risk assessment. Please obtain consent and complete medication review. Thank you for your help.  I confirm that guidance regarding antiplatelet and oral anticoagulation therapy has been completed and, if necessary, noted below.  Per office protocol, if patient is without any new symptoms or concerns at the time of their virtual visit, she may hold ASA for 7 days prior to procedure. Please resume ASA as soon as possible postprocedure, at the discretion of the surgeon.    I also confirmed the patient resides in the state of [place] . As per [facility] Medical Board telemedicine laws, the patient must reside in the state in which the provider is licensed.   Lamarr Satterfield, NP 02/25/2024, 4:02 PM [facility]

## 2024-02-25 NOTE — Telephone Encounter (Signed)
   Pre-operative Risk Assessment    Patient Name: [name]  DOB: Feb 26, 1956 MRN: [id number]   Date of last office visit: 09/26/23 ROSABEL MOSE, NP Date of next office visit: NONE   Request for Surgical Clearance    Procedure:  RIGHT REVERSE SHOULDER ARTHROPLASTY  Date of Surgery:  Clearance TBD                                Surgeon:  GILDARDO ALDERTON, MD Surgeon's Group or Practice Name:  [facility] Phone number:  [phone] Fax number:  [phone]  ATTN: APRIL   Type of Clearance Requested:   - Medical  - Pharmacy:  Hold Aspirin      Type of Anesthesia:  General    Additional requests/questions:    [name]   02/25/2024, 3:56 PM

## 2024-02-25 NOTE — Telephone Encounter (Signed)
 Med Rec and Consent done.    Patient Consent for Virtual Visit        [name] has provided verbal consent on 02/25/2024 for a virtual visit (video or telephone).   CONSENT FOR VIRTUAL VISIT FOR:  [name]  By participating in this virtual visit I agree to the following:  I hereby voluntarily request, consent and authorize [facility] and its employed or contracted physicians, physician assistants, nurse practitioners or other licensed health care professionals (the Practitioner), to provide me with telemedicine health care services (the "Services) as deemed necessary by the treating Practitioner. I acknowledge and consent to receive the Services by the Practitioner via telemedicine. I understand that the telemedicine visit will involve communicating with the Practitioner through live audiovisual communication technology and the disclosure of certain medical information by electronic transmission. I acknowledge that I have been given the opportunity to request an in-person assessment or other available alternative prior to the telemedicine visit and am voluntarily participating in the telemedicine visit.  I understand that I have the right to withhold or withdraw my consent to the use of telemedicine in the course of my care at any time, without affecting my right to future care or treatment, and that the Practitioner or I may terminate the telemedicine visit at any time. I understand that I have the right to inspect all information obtained and/or recorded in the course of the telemedicine visit and may receive copies of available information for a reasonable fee.  I understand that some of the potential risks of receiving the Services via telemedicine include:  Delay or interruption in medical evaluation due to technological equipment failure or disruption; Information transmitted may not be sufficient (e.g. poor resolution of images) to allow for appropriate medical decision  making by the Practitioner; and/or  In rare instances, security protocols could fail, causing a breach of personal health information.  Furthermore, I acknowledge that it is my responsibility to provide information about my medical history, conditions and care that is complete and accurate to the best of my ability. I acknowledge that Practitioner's advice, recommendations, and/or decision may be based on factors not within their control, such as incomplete or inaccurate data provided by me or distortions of diagnostic images or specimens that may result from electronic transmissions. I understand that the practice of medicine is not an exact science and that Practitioner makes no warranties or guarantees regarding treatment outcomes. I acknowledge that a copy of this consent can be made available to me via my patient portal [facility] MyChart), or I can request a printed copy by calling the office of [facility].    I understand that my insurance will be billed for this visit.   I have read or had this consent read to me. I understand the contents of this consent, which adequately explains the benefits and risks of the Services being provided via telemedicine.  I have been provided ample opportunity to ask questions regarding this consent and the Services and have had my questions answered to my satisfaction. I give my informed consent for the services to be provided through the use of telemedicine in my medical care

## 2024-02-25 NOTE — Telephone Encounter (Signed)
 S/W pt and scheduled TELE preop appt for 03/04/24. Med Rec and consent done.  Will update the surgeons office.

## 2024-03-02 ENCOUNTER — Other Ambulatory Visit: Payer: Self-pay | Admitting: *Deleted

## 2024-03-02 DIAGNOSIS — C50411 Malignant neoplasm of upper-outer quadrant of right female breast: Secondary | ICD-10-CM

## 2024-03-03 ENCOUNTER — Inpatient Hospital Stay: Admitting: Hematology and Oncology

## 2024-03-03 ENCOUNTER — Encounter (HOSPITAL_BASED_OUTPATIENT_CLINIC_OR_DEPARTMENT_OTHER): Payer: Self-pay | Admitting: Orthopaedic Surgery

## 2024-03-03 ENCOUNTER — Inpatient Hospital Stay: Attending: Hematology and Oncology

## 2024-03-03 ENCOUNTER — Other Ambulatory Visit: Payer: Self-pay | Admitting: Hematology and Oncology

## 2024-03-03 VITALS — BP 138/78 | HR 71 | Temp 98.0°F | Resp 14 | Ht 64.0 in | Wt 144.0 lb

## 2024-03-03 DIAGNOSIS — Z79899 Other long term (current) drug therapy: Secondary | ICD-10-CM | POA: Insufficient documentation

## 2024-03-03 DIAGNOSIS — Z170 Estrogen receptor positive status [ER+]: Secondary | ICD-10-CM

## 2024-03-03 DIAGNOSIS — Z1722 Progesterone receptor negative status: Secondary | ICD-10-CM | POA: Insufficient documentation

## 2024-03-03 DIAGNOSIS — C50411 Malignant neoplasm of upper-outer quadrant of right female breast: Secondary | ICD-10-CM | POA: Insufficient documentation

## 2024-03-03 DIAGNOSIS — M6284 Sarcopenia: Secondary | ICD-10-CM | POA: Diagnosis not present

## 2024-03-03 DIAGNOSIS — Z79811 Long term (current) use of aromatase inhibitors: Secondary | ICD-10-CM | POA: Insufficient documentation

## 2024-03-03 DIAGNOSIS — Z1731 Human epidermal growth factor receptor 2 positive status: Secondary | ICD-10-CM | POA: Insufficient documentation

## 2024-03-03 DIAGNOSIS — R634 Abnormal weight loss: Secondary | ICD-10-CM | POA: Diagnosis not present

## 2024-03-03 DIAGNOSIS — M810 Age-related osteoporosis without current pathological fracture: Secondary | ICD-10-CM | POA: Insufficient documentation

## 2024-03-03 DIAGNOSIS — Z9011 Acquired absence of right breast and nipple: Secondary | ICD-10-CM | POA: Diagnosis not present

## 2024-03-03 DIAGNOSIS — M069 Rheumatoid arthritis, unspecified: Secondary | ICD-10-CM | POA: Diagnosis not present

## 2024-03-03 DIAGNOSIS — D110 Benign neoplasm of parotid gland: Secondary | ICD-10-CM | POA: Insufficient documentation

## 2024-03-03 DIAGNOSIS — Z1231 Encounter for screening mammogram for malignant neoplasm of breast: Secondary | ICD-10-CM

## 2024-03-03 LAB — CBC WITH DIFFERENTIAL (CANCER CENTER ONLY)
Abs Immature Granulocytes: 0.02 K/uL (ref 0.00–0.07)
Basophils Absolute: 0 K/uL (ref 0.0–0.1)
Basophils Relative: 0 %
Eosinophils Absolute: 0.2 K/uL (ref 0.0–0.5)
Eosinophils Relative: 2 %
HCT: 38.9 % (ref 36.0–46.0)
Hemoglobin: 12.6 g/dL (ref 12.0–15.0)
Immature Granulocytes: 0 %
Lymphocytes Relative: 34 %
Lymphs Abs: 3.7 K/uL (ref 0.7–4.0)
MCH: 25.6 pg — ABNORMAL LOW (ref 26.0–34.0)
MCHC: 32.4 g/dL (ref 30.0–36.0)
MCV: 78.9 fL — ABNORMAL LOW (ref 80.0–100.0)
Monocytes Absolute: 0.7 K/uL (ref 0.1–1.0)
Monocytes Relative: 6 %
Neutro Abs: 6.2 K/uL (ref 1.7–7.7)
Neutrophils Relative %: 58 %
Platelet Count: 297 K/uL (ref 150–400)
RBC: 4.93 MIL/uL (ref 3.87–5.11)
RDW: 16.3 % — ABNORMAL HIGH (ref 11.5–15.5)
WBC Count: 10.7 K/uL — ABNORMAL HIGH (ref 4.0–10.5)
nRBC: 0 % (ref 0.0–0.2)

## 2024-03-03 LAB — CMP (CANCER CENTER ONLY)
ALT: 6 U/L (ref 0–44)
AST: 15 U/L (ref 15–41)
Albumin: 4.1 g/dL (ref 3.5–5.0)
Alkaline Phosphatase: 124 U/L (ref 38–126)
Anion gap: 7 (ref 5–15)
BUN: 9 mg/dL (ref 8–23)
CO2: 25 mmol/L (ref 22–32)
Calcium: 9.5 mg/dL (ref 8.9–10.3)
Chloride: 112 mmol/L — ABNORMAL HIGH (ref 98–111)
Creatinine: 0.82 mg/dL (ref 0.44–1.00)
GFR, Estimated: >60 mL/min (ref >60)
Glucose, Bld: 93 mg/dL (ref 70–99)
Potassium: 4 mmol/L (ref 3.5–5.1)
Sodium: 144 mmol/L (ref 135–145)
Total Bilirubin: 0.4 mg/dL (ref 0.0–1.2)
Total Protein: 7.6 g/dL (ref 6.5–8.1)

## 2024-03-03 NOTE — Progress Notes (Signed)
 Patient Care Team: Emilio Joesph VEAR DEVONNA as PCP - General (Physician Assistant) Pietro Redell RAMAN, MD as PCP - Cardiology (Cardiology) Odean Potts, MD as Consulting Physician (Hematology and Oncology) Aron Shoulders, MD as Consulting Physician (General Surgery) Keenan Hastings, MD as Consulting Physician (Radiation Oncology) Bonner Ade, MD as Consulting Physician (Physical Medicine and Rehabilitation) Pietro Redell RAMAN, MD as Consulting Physician (Cardiology) Caleen Dirks, MD as Consulting Physician (Internal Medicine)  DIAGNOSIS:  Encounter Diagnosis  Name Primary?   Malignant neoplasm of upper-outer quadrant of right breast in female, estrogen receptor positive (HCC) Yes    SUMMARY OF ONCOLOGIC HISTORY: Oncology History  Breast cancer of upper-outer quadrant of right female breast (HCC)  12/16/2013 Mammogram   1. A palpable 4.1 cm diameter mass/asymmetry with associated coarse heterogeneous microcalcifications at 11 o'clock 2 cm from the right nipple demonstrates mammographic and sonographic features highly suggestive of malignancy.    12/16/2013 Initial Biopsy   Breast cancer of upper-outer quadrant of right female breast; Invasive Ductal Cancer Er 100%, PR: 0%; Her2 Positive Ratio 4.28: Ki 67:24%;    12/23/2013 Breast MRI   Right Breast 4.4 x 3 x 2.5 cm and a sub cm satellite lesion. No LN   01/28/2014 - 05/27/2014 Neo-Adjuvant Chemotherapy   TC HP x2 cycles ( developed hand-foot syndrome) GC HP x1 cycle (developed dehydration and renal failure) CHP from 04/08/2014   07/21/2014 - 09/01/2014 Chemotherapy   Herceptin  maintenance therapy   07/28/2014 Surgery   Right breast mastectomy: DCIS 7 cm, 4 lymph nodes negative, no invasive breast cancer was identified, complete pathologic response   08/19/2014 - 04/20/2015 Anti-estrogen oral therapy   Anastrozole  1 mg daily stopped 09/23/2014 due to diffuse body aches, diarrhea, nausea, fatigue we switched her to tamoxifen  in 03/22/2015  stopped after 2 weeks with diarrhea, tiredness and nausea     CHIEF COMPLIANT: Surveillance of breast cancer  HISTORY OF PRESENT ILLNESS:   History of Present Illness [name] is a 68 year old female with breast cancer who presents for follow-up regarding weight loss and medication side effects.  She has lost 40 pounds unintentionally over the past six months, primarily due to a lack of appetite. Mirtazapine has been effective in increasing her appetite and improving her mood.  She is on letrozole  since May 2024 and experiences hair thinning as a side effect but prefers to continue the medication to prevent cancer recurrence. She underwent chemotherapy in 2016 and recalls significant illness during that time.  Her diet has improved with the return of her appetite, and she is incorporating more protein. She occasionally indulges in sweets. She missed her mammogram this year and plans to schedule it soon. A CT scan was performed in June 2025 for regular monitoring due to her history of breast cancer and lung changes.     ALLERGIES:  is allergic to pegfilgrastim  and codeine.  MEDICATIONS:  Current Outpatient Medications  Medication Sig Dispense Refill   sertraline  (ZOLOFT ) 50 MG tablet Take 25 mg by mouth at bedtime. (Patient taking differently: Take 100 mg by mouth at bedtime.)     acetaminophen  (TYLENOL ) 325 MG tablet Take 2 tablets (650 mg total) by mouth every 6 (six) hours as needed for mild pain (pain score 1-3), fever or headache.     atenolol  (TENORMIN ) 50 MG tablet TAKE 1 TABLET(50 MG) BY MOUTH TWICE DAILY 180 tablet 3   B Complex-C (B-COMPLEX WITH VITAMIN C) tablet Take 1 tablet by mouth 2 (two) times a week.  famotidine  (PEPCID ) 40 MG tablet Take 40 mg by mouth at bedtime.     fluticasone  (FLONASE ) 50 MCG/ACT nasal spray Place 2 sprays into both nostrils daily. 16 g 6   folic acid (FOLVITE) 1 MG tablet Take 1 mg by mouth daily.     gabapentin  (NEURONTIN ) 300 MG capsule  Take 1 capsule (300 mg total) by mouth 2 (two) times daily.     letrozole  (FEMARA ) 2.5 MG tablet TAKE 1 TABLET(2.5 MG) BY MOUTH DAILY 90 tablet 3   mirtazapine (REMERON) 7.5 MG tablet Take 7.5 mg by mouth daily.     Misc Natural Products (TUMERSAID) TABS as directed Orally     Multiple Vitamins tablet Take by mouth.     Multiple Vitamins-Minerals (WOMENS MULTIVITAMIN PO) Take 1 tablet by mouth 3 (three) times a week.     ondansetron  (ZOFRAN ) 8 MG tablet Take 1 tablet (8 mg total) by mouth every 8 (eight) hours as needed for nausea or vomiting. (Patient not taking: Reported on 02/25/2024) 30 tablet 3   oxyCODONE  (ROXICODONE ) 5 MG immediate release tablet Take 1 tablet (5 mg total) by mouth every 4 (four) hours as needed for severe pain (pain score 7-10) or breakthrough pain. 15 tablet 0   pantoprazole  (PROTONIX ) 40 MG tablet Take 40 mg by mouth 2 (two) times daily.     polyethylene glycol (MIRALAX  / GLYCOLAX ) 17 g packet Take 17 g by mouth daily as needed for moderate constipation.     Turmeric (QC TUMERIC COMPLEX PO) Take 1 tablet by mouth daily.     No current facility-administered medications for this visit.    PHYSICAL EXAMINATION: ECOG PERFORMANCE STATUS: 1 - Symptomatic but completely ambulatory  Vitals:   03/03/24 0840  BP: 138/78  Pulse: 71  Resp: 14  Temp: 98 F (36.7 C)  SpO2: 97%   Filed Weights   03/03/24 0840  Weight: 144 lb (65.3 kg)      LABORATORY DATA:  I have reviewed the data as listed    Latest Ref Rng & Units 03/03/2024    8:24 AM 12/24/2023    8:01 AM 10/16/2023   10:15 AM  CMP  Glucose 70 - 99 mg/dL 93  94  95   BUN 8 - 23 mg/dL 9  14  10    Creatinine 0.44 - 1.00 mg/dL 9.17  9.33  9.33   Sodium 135 - 145 mmol/L 144  142  142   Potassium 3.5 - 5.1 mmol/L 4.0  3.2  3.3   Chloride 98 - 111 mmol/L 112  110  108   CO2 22 - 32 mmol/L 25  24  27    Calcium  8.9 - 10.3 mg/dL 9.5  8.6  8.9   Total Protein 6.5 - 8.1 g/dL 7.6  7.4  7.3   Total Bilirubin 0.0 -  1.2 mg/dL 0.4  0.4  0.5   Alkaline Phos 38 - 126 U/L 124  103  120   AST 15 - 41 U/L 15  11  12    ALT 0 - 44 U/L 6  5  6      Lab Results  Component Value Date   WBC 10.7 (H) 03/03/2024   HGB 12.6 03/03/2024   HCT 38.9 03/03/2024   MCV 78.9 (L) 03/03/2024   PLT 297 03/03/2024   NEUTROABS 6.2 03/03/2024    ASSESSMENT & PLAN:  Breast cancer of upper-outer quadrant of right female breast (HCC) Right breast invasive ductal carcinoma 4.4 cm in size with a  satellite lesion based on MRI result there were no abnormal lymph nodes ER 100% positive PR 0% HER-2/neu positive (HER-2/CEP 17 ratio 4.28) grade 3 T2, N0, M0 clinical stage IIa. Status post neoadjuvant chemotherapy with TCHPx2, GCHPx1, CHPx1, HPx2 , complete pathologic response with only residual DCIS 7 cm remaining. Anastrozole  started 08/19/2014 stopped 09/18/2014, Herceptin  stopped April 2016    PET CT scan 10/28/2022: Hypermetabolic left upper lobe mass (3.1 x 4.6 cm) with hypermetabolic nodules in the right hemithorax (11 mm) and hypermetabolic ipsilateral mediastinal lymph node (AP window lymph node 5 mm). 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 Shelah.  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   Current treatment: Letrozole   Letrozole  toxicities: tolerating it well   Pain issues: Going to see pain management History of left hip fracture Parotid gland mass: Patient does not desire for surgery.  We will do scans to see if there is any evidence of progression. Rheumatoid arthritis and severe osteoarthritis   Microcytic anemia: 12/24/2023: Hemoglobin 11.1, MCV 77, potassium 3.2, calcium  8.6   10/29/2023: CT CAP: Significantly diminished consolidation left upper lobe  previously seen subpleural nodule not measurable, unchanged right parotid mass 1.2 cm, emphysema, hepatic steatosis   05/21/2023: Postop sup trochanteric left femur fracture ORIF Osteoporosis: Recommend Zometa  infusion every 6 months Plan for scans in June 2026 ------------------------------------- Assessment and Plan Assessment & Plan Estrogen receptor-positive breast cancer, status post chemotherapy and on adjuvant letrozole  therapy Letrozole  therapy to continue for seven years to reduce recurrence risk despite hair thinning. - Continue letrozole  therapy. - Order left-sided mammogram. - Discuss potential side effects of letrozole , including hair thinning.  Osteoporosis on antiresorptive therapy Side effects manageable. - Schedule next bone infusion for February 2026.  Unintentional weight loss and sarcopenia Significant weight loss due to decreased appetite. Mirtazapine improved appetite and mood. Focus on muscle mass preservation. - Encourage high-protein diet including eggs, meats, greens, peas, and nuts. - Recommend physical activity, starting with walking and potentially using a treadmill. - Explore gym membership through Medicare's [employer] program. - Consider physical therapy for guided exercises.  Major depressive disorder, improved on mirtazapine Depression improved with mirtazapine, which also increased appetite. - Continue mirtazapine therapy. - Monitor mood and appetite changes.  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 - Plan for annual CT scan in June 2026 to monitor parotid gland neoplasm.      No orders of the defined types were placed in this encounter.  The patient has a good understanding of the overall plan. she agrees with it. she will call with any problems that may develop before the next visit here.  I personally spent a total of 30 minutes in the care of the patient today including preparing  to see the patient, getting/reviewing separately obtained history, performing a medically appropriate exam/evaluation, counseling and educating, placing orders, referring and communicating with other health care professionals, documenting clinical information in the EHR, independently interpreting results, communicating results, and coordinating care.   Viinay K Hyde Sires, MD 03/03/24

## 2024-03-03 NOTE — Progress Notes (Unsigned)
 Virtual Visit via Telephone Note   Because of [name] co-morbid illnesses, she is at least at moderate risk for complications without adequate follow up.  This format is felt to be most appropriate for this patient at this time.  Due to technical limitations with video connection Web designer), today's appointment will be conducted as an audio only telehealth visit, and [name] verbally agreed to proceed in this manner.   All issues noted in this document were discussed and addressed.  No physical exam could be performed with this format.  Evaluation Performed:  Preoperative cardiovascular risk assessment _____________   Date:  03/04/2024   Patient ID:  [name], [name] 1956/04/13, MRN [id number] Patient Location:  Home Provider location:   Office  Primary Care Provider:  Emilio Joesph DEL, PA-C Primary Cardiologist:  Redell Shallow, MD  Chief Complaint / Patient Profile  68 y.o. y/o female with a h/o SVT, hypertension, hyperlipidemia who is pending right reverse shoulder arthroplasty with Dr. Arlinda and presents today for telephonic preoperative cardiovascular risk assessment. History of Present Illness  [name] is a 68 y.o. female who presents via audio/video conferencing for a telehealth visit today.  Pt was last seen in cardiology clinic on 09/26/23 by Stephanine Reas,  NP.  At that time [name] was doing well.  The patient is now pending procedure as outlined above. Since her last visit, she has remained stable from a cardiac standpoint. She notes very rare and well controlled palpitations. She denies chest pain, shortness of breath, lower extremity edema, fatigue, melena, hematuria, hemoptysis, diaphoresis, weakness, presyncope, syncope, orthopnea, and PND. She is able to achieve greater than 4 METs of activity with household chores, walking and stair climbing.  Past Medical History    Past Medical History:  Diagnosis Date   Allergy    Phreesia 03/21/2020    Anemia    Phreesia 03/21/2020   Anxiety    Arthritis    Phreesia 03/21/2020   Blood transfusion without reported diagnosis    Phreesia 03/21/2020   Cancer (HCC)    right breast cancer   Depression    Depression    Phreesia 03/21/2020   Fibromyalgia    GERD (gastroesophageal reflux disease)    Phreesia 03/21/2020   Heavy smoker    Hepatitis C    Pt was given too much interferon - critical care x2d, 5 days total hosp   Neuromuscular disorder (HCC)    Phreesia 03/21/2020   Osteoporosis    Perimenopausal 11/18/2003   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), hypoglycemia 2/04-resolved with PO calcium  + D, SVT, was treated with iterferon and ribavirin-10/3-2/04   Personal history of chemotherapy    PUD (peptic ulcer disease)    Rheumatoid arthritis(714.0)    possible   SVT (supraventricular tachycardia)    Wears glasses    Past Surgical History:  Procedure Laterality Date   ABDOMINAL HYSTERECTOMY     BREAST SURGERY N/A    Phreesia 03/21/2020   CESAREAN SECTION  09/17/1980   CESAREAN SECTION N/A    Phreesia 03/21/2020   COLONOSCOPY     DILATION AND CURETTAGE OF UTERUS     hysterectomy-for dysmenorrhea  09/17/2000   INTRAMEDULLARY (IM) NAIL INTERTROCHANTERIC Left 05/21/2023   Procedure: INTRAMEDULLARY (IM) NAIL INTERTROCHANTERIC WITH HARDWARE REMOVAL;  Surgeon: Genelle Standing, MD;  Location: [facility];  Service: Orthopedics;  Laterality: Left;   JOINT REPLACEMENT N/A    Phreesia 03/21/2020  MASTECTOMY Right 2016   MASTECTOMY W/ SENTINEL NODE BIOPSY Right 07/28/2014   Procedure: RIGHT MASTECTOMY WITH SENTINEL LYMPH NODE BIOPSY ;  Surgeon: Jina Nephew, MD;  Location: [facility];  Service: General;  Laterality: Right;   NASAL SINUS SURGERY  03/20/1984   for deviated septum   PORTACATH PLACEMENT Left 01/20/2014   Procedure: INSERTION PORT-A-CATH;  Surgeon: Jina Nephew, MD;  Location: [facility];  Service: General;  Laterality: Left;   TONSILLECTOMY  05/20/1961   UPPER GI ENDOSCOPY     Allergies Allergies  Allergen Reactions   Pegfilgrastim  Hives    Blistering rash all over body   Codeine Itching and Nausea Only    Pt has had codeine with pre-administration to prevent reaction without any problem.  Other Reaction(s): nausea and vomiting, itching   Home Medications    Prior to Admission medications   Medication Sig Start Date End Date Taking? Authorizing Provider  acetaminophen  (TYLENOL ) 325 MG tablet Take 2 tablets (650 mg total) by mouth every 6 (six) hours as needed for mild pain (pain score 1-3), fever or headache. 05/24/23   Rai, Ripudeep MARLA, MD  atenolol  (TENORMIN ) 50 MG tablet TAKE 1 TABLET(50 MG) BY MOUTH TWICE DAILY 12/18/23   Damontre Millea D, NP  B Complex-C (B-COMPLEX WITH VITAMIN C) tablet Take 1 tablet by mouth 2 (two) times a week.    [provider]  famotidine  (PEPCID ) 40 MG tablet Take 40 mg by mouth at bedtime. 01/22/23   [provider]  fluticasone  (FLONASE ) 50 MCG/ACT nasal spray Place 2 sprays into both nostrils daily. 01/30/23   Soldatova, Liuba, MD  folic acid (FOLVITE) 1 MG tablet Take 1 mg by mouth daily. 02/23/24   [provider]  gabapentin  (NEURONTIN ) 300 MG capsule Take 1 capsule (300 mg total) by mouth 2 (two) times daily. 05/24/23   Rai, Ripudeep K, MD  letrozole  (FEMARA ) 2.5 MG tablet TAKE 1 TABLET(2.5 MG) BY MOUTH DAILY 11/14/23   Gudena, Vinay, MD  mirtazapine (REMERON) 7.5 MG tablet Take 7.5 mg by mouth daily. 02/19/24   [provider]  Misc Natural Products (TUMERSAID) TABS as directed Orally    [provider]  Multiple Vitamins tablet Take by mouth.    [provider]  Multiple Vitamins-Minerals (WOMENS MULTIVITAMIN PO) Take 1 tablet by mouth 3 (three) times a week.    [provider]  ondansetron  (ZOFRAN ) 8 MG tablet Take 1 tablet (8 mg total) by mouth every 8 (eight) hours as  needed for nausea or vomiting. Patient not taking: Reported on 02/25/2024 12/12/22   Odean Potts, MD  oxyCODONE  (ROXICODONE ) 5 MG immediate release tablet Take 1 tablet (5 mg total) by mouth every 4 (four) hours as needed for severe pain (pain score 7-10) or breakthrough pain. 01/29/24   Genelle Standing, MD  pantoprazole  (PROTONIX ) 40 MG tablet Take 40 mg by mouth 2 (two) times daily.    [provider]  polyethylene glycol (MIRALAX  / GLYCOLAX ) 17 g packet Take 17 g by mouth daily as needed for moderate constipation. 05/24/23   Rai, Nydia MARLA, MD  sertraline  (ZOLOFT ) 50 MG tablet Take 25 mg by mouth at bedtime. Patient taking differently: Take 100 mg by mouth at bedtime. 03/11/23   [provider]  Turmeric (QC TUMERIC COMPLEX PO) Take 1 tablet by mouth daily.    [provider]   Physical Exam  Vital Signs:  [name] does not have vital signs available  for review today. Given telephonic nature of communication, physical exam is limited. AAOx3. NAD. Normal affect.  Speech and respirations are unlabored. Accessory Clinical Findings  None Assessment & Plan  1.  Preoperative Cardiovascular Risk Assessment: Ms. Juncaj perioperative risk of a major cardiac event is 0.4% according to the Revised Cardiac Risk Index (RCRI).  Therefore, she is at low risk for perioperative complications.   Her functional capacity is fair at 5.62 METs according to the Duke Activity Status Index (DASI). Recommendations: According to ACC/AHA guidelines, no further cardiovascular testing needed.  The patient may proceed to surgery at acceptable risk.   Antiplatelet and/or Anticoagulation Recommendations: Patient reports that she does not take a daily aspirin .    The patient was advised that if she develops new symptoms prior to surgery to contact our office to arrange for a follow-up visit, and she verbalized understanding. A copy of this note will be routed to requesting surgeon.  Time:    Today, I have spent 10 minutes with the patient with telehealth technology discussing medical history, symptoms, and management plan.    Jadeyn Hargett D Ieesha Abbasi, NP  03/04/2024, 10:47 AM

## 2024-03-03 NOTE — Assessment & Plan Note (Signed)
 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 Status post neoadjuvant chemotherapy with TCHPx2, GCHPx1, CHPx1, HPx2 , complete pathologic response with only residual DCIS 7 cm remaining. Anastrozole  started 08/19/2014 stopped 09/18/2014, Herceptin  stopped April 2016    PET CT scan 10/28/2022: Hypermetabolic left upper lobe mass (3.1 x 4.6 cm) with hypermetabolic nodules in the right hemithorax (11 mm) and hypermetabolic ipsilateral mediastinal lymph node (AP window lymph node 5 mm). 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 Shelah.  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   Current treatment: Letrozole   Letrozole  toxicities: tolerating it well   Pain issues: Going to see pain management History of left hip fracture Parotid gland mass: Patient does not desire for surgery.  We will do scans to see if there is any evidence of progression. Rheumatoid arthritis and severe osteoarthritis   Microcytic anemia: 12/24/2023: Hemoglobin 11.1, MCV 77, potassium 3.2, calcium  8.6   10/29/2023: CT CAP: Significantly diminished consolidation left upper lobe previously seen subpleural nodule not measurable, unchanged right parotid mass 1.2 cm, emphysema, hepatic steatosis   05/21/2023: Postop sup trochanteric left femur fracture ORIF Osteoporosis: Recommend Zometa  infusion annually

## 2024-03-04 ENCOUNTER — Ambulatory Visit: Attending: Student in an Organized Health Care Education/Training Program

## 2024-03-04 DIAGNOSIS — Z01810 Encounter for preprocedural cardiovascular examination: Secondary | ICD-10-CM

## 2024-03-10 ENCOUNTER — Encounter (HOSPITAL_BASED_OUTPATIENT_CLINIC_OR_DEPARTMENT_OTHER): Payer: Self-pay | Admitting: Orthopaedic Surgery

## 2024-03-15 NOTE — Telephone Encounter (Signed)
 I spoke with the patient on 03/12/24 and scheduled her for surgery on 04/19/24.

## 2024-03-22 ENCOUNTER — Encounter: Payer: Self-pay | Admitting: Radiology

## 2024-03-26 ENCOUNTER — Ambulatory Visit
Admission: RE | Admit: 2024-03-26 | Discharge: 2024-03-26 | Disposition: A | Discharge status: Home / Self Care | Source: Ambulatory Visit | Attending: Hematology and Oncology | Admitting: Hematology and Oncology

## 2024-03-26 DIAGNOSIS — Z1231 Encounter for screening mammogram for malignant neoplasm of breast: Secondary | ICD-10-CM

## 2024-04-01 NOTE — Progress Notes (Signed)
 [facility]    Date: 04/01/2024 Patient name: Ronal Moellers Debera Haddock Date of birth: March 27, 1956   ASSESSMENT/PLAN:  Diagnoses and all orders for this visit: Anxiety Recurrent major depressive disorder, in partial remission Weight loss -  mood improved and weight stable - up 3 lbs - will increase remeron to 15 mg daily.  -  goal is not necessarily to gain more weight - she is at a healthy weight at this time. However, I do not want her to continue to lose weight. Encouraged meals 3x a day. Encouraged her to apply for meals on wheels prior to her surgery.  -     mirtazapine (REMERON) 15 mg tablet; Take one tablet (15 mg dose) by mouth at bedtime.  Insomnia, unspecified type Improved  -     mirtazapine (REMERON) 15 mg tablet; Take one tablet (15 mg dose) by mouth at bedtime.  General weakness Encouraged PT home health but she will get PT for her upcoming shoulder replacement. Focus on that and then consider more PT if needed.   Needs flu shot -     Fluad Trivalent Adjuvanted (age 48 and older/solid organ transplant recipients age 66-64)   Follow up in about 4 months (around 07/30/2024) for anxiety, depression.    Patient expresses understanding of their current medications and use.  We discussed potential side effects, drug interactions, instructions for taking the medication, and the consequences of not taking it. Patient verbalized an understanding of these instructions. Patient is able to verbalize understanding of the care plan discussed today. Patient's medical and personal goals were discussed today.   Patient was instructed to return sooner if no improvement, and we discussed precautions to seek emergency medical care if condition worsens.    SUBJECTIVE:  Ms. Kuznicki is a 68 y.o. female that presents to clinic today regarding the following issues: Follow-up (Follow up in about 6 weeks for weight check, remeron .)  At last OV, due to the  increased weight loss, lack of appetite, and depression/anxiety/insomnia, we trialed 7.5 mg of remeron. She feels it has really helped her sleep. She is sleeping much better. She feels her mood is improved. She has also gained 3 lbs and feels her appetite is better. Her oncologic encouraged her to apply for meals on wheels. She is going to work on that. She feels she just doesn't have motivated to get up and fix something to eat. Sometimes she will be hungry but not want to do it for herself.   She has upcoming right shoulder replacement first week of December. She has planned PT for this as well. She overall does feel she has lost a lot of muscle mass in her weight loss and not getting out of the house or being active anymore.   Wants her flu shot today.        Reviewed and updated this visit by provider: Tobacco  Allergies  Meds  Problems  Med Hx  Surg Hx  Fam Hx        Medications Patient's Medications  New Prescriptions   MIRTAZAPINE (REMERON) 15 MG TABLET    Take one tablet (15 mg dose) by mouth at bedtime.  Previous Medications   ASPIRIN  (ECOTRIN) 325 MG EC TABLET    Take one tablet (325 mg dose) by mouth daily.   ATENOLOL  (TENORMIN ) 50 MG TABLET    TAKE 1 TABLET (50 MG TOTAL) BY MOUTH 2 (TWO) TIMES DAILY.   B COMPLEX-C (B-COMPLEX WITH  VITAMIN C) TABLET    Take one tablet by mouth 2 (two) times a week.   FAMOTIDINE  (PEPCID ) 40 MG TABLET    TAKE 1 TABLET(40 MG) BY MOUTH AT BEDTIME   FLUTICASONE  PROPIONATE (FLONASE ) 50 MCG/ACTUATION NASAL SPRAY    two sprays by Nasal route daily.   FOLIC ACID 1 MG TABLET    Take one tablet (1 mg dose) by mouth daily.   GABAPENTIN  (NEURONTIN ) 600 MG TABLET    Take one tablet (600 mg dose) by mouth 3 (three) times a day as needed. TAKE 1 TABLET(600 MG) BY MOUTH UP TO THREE TIMES DAILY AS NEEDED   LETROZOLE  (FEMARA ) 2.5 MG TABLET    Take one tablet (2.5 mg dose) by mouth every morning.   MULTIPLE VITAMINS PO    Take 1 tablet by mouth 3 (three)  times a week.   NALOXONE (NARCAN) NASAL SPRAY (TAKE HOME PACK)    one spray by Intranasal route once as needed for Opioid Reversal for up to 30 days.   NON FORMULARY    vicks nasal balm-uses under nose prn   OXYCODONE  HCL (ROXICODONE ) 5 MG IMMEDIATE RELEASE TABLET    Take one tablet (5 mg dose) by mouth.   PANTOPRAZOLE  SODIUM (PROTONIX ) 40 MG TABLET    Take one tablet (40 mg dose) by mouth 2 (two) times daily.   POLYETHYLENE GLYCOL (MIRALAX ) 17 G PACKET    Take seventeen g by mouth daily as needed.   SERTRALINE  (ZOLOFT ) 100 MG TABLET    Take one tablet (100 mg dose) by mouth daily.   TRAMADOL  (ULTRAM ) 50 MG TABLET    Take one tablet (50 mg dose) by mouth every 6 (six) hours as needed.   TURMERIC 500 MG CAPS CAPSULE    Take one capsule (500 mg dose) by mouth daily.  Modified Medications   No medications on file  Discontinued Medications   MIRTAZAPINE (REMERON) 7.5 MG TABLET    Take one tablet (7.5 mg dose) by mouth at bedtime.      ROS  Review of Systems  Constitutional: Negative.   HENT: Negative.    Eyes: Negative.   Respiratory: Negative.    Cardiovascular: Negative.   Gastrointestinal: Negative.   Endocrine: Negative.   Genitourinary: Negative.   Musculoskeletal: Negative.   Skin: Negative.   Allergic/Immunologic: Negative.   Neurological: Negative.   Hematological: Negative.   Psychiatric/Behavioral: Negative.       Review of Systems - All other systems reviewed are negative except as noted above.   OBJECTIVE:  Vitals BP 114/80 (Patient Position: Sitting)   Pulse 57   Temp 98.1 F (36.7 C) (Temporal)   Ht 5' 5 (1.651 m)   Wt 146 lb (66.2 kg)   SpO2 98%   BMI 24.30 kg/m    PHYSICAL:  General Appearance: Alert, oriented and appears in no acute distress. Sitting comfortably in chair. Head: Normocephalic, without obvious abnormality, atraumatic.  Respiratory: Good air movement throughout. No accessory muscle use, increased work of breathing or respiratory  distress. Clear to auscultation in all lung [place]. Normal effort of breathing, non-labored. No adventitious lung sounds including wheezes, rhonchi or crackles. Cardiovascular: Regular rate and rhythm, S1, S2 normal, no murmur. No heaves, lifts or thrills palpable. Skin: Warm, dry.  Psychiatric: Pleasant. Mood and affect normal. Not anxious or tearful. Neurological: AOx3. Gait normal with walker.    Joesph HILARIO Cedar, PA-C  [facility]  04/01/2024 10:12 AM

## 2024-04-01 NOTE — Progress Notes (Signed)
 Prior to vaccine administration appropriate Vaccine Information Sheets provided to patient/guardian.  All questions/concerns addressed.  Vaccines administered per Holton Sidman order.  Patient/Guardian instructed to wait in the office for 15 minutes for observation of any adverse reactions.

## 2024-04-07 ENCOUNTER — Other Ambulatory Visit: Payer: Self-pay

## 2024-04-07 ENCOUNTER — Encounter (HOSPITAL_BASED_OUTPATIENT_CLINIC_OR_DEPARTMENT_OTHER): Payer: Self-pay | Admitting: Orthopaedic Surgery

## 2024-04-12 ENCOUNTER — Other Ambulatory Visit (HOSPITAL_BASED_OUTPATIENT_CLINIC_OR_DEPARTMENT_OTHER): Payer: Self-pay | Admitting: Orthopaedic Surgery

## 2024-04-12 ENCOUNTER — Encounter (HOSPITAL_BASED_OUTPATIENT_CLINIC_OR_DEPARTMENT_OTHER): Payer: Self-pay | Admitting: Orthopaedic Surgery

## 2024-04-12 DIAGNOSIS — G8929 Other chronic pain: Secondary | ICD-10-CM

## 2024-04-14 ENCOUNTER — Encounter (HOSPITAL_BASED_OUTPATIENT_CLINIC_OR_DEPARTMENT_OTHER)
Admission: RE | Admit: 2024-04-14 | Discharge: 2024-04-14 | Disposition: A | Discharge status: Home / Self Care | Source: Ambulatory Visit | Attending: Orthopaedic Surgery | Admitting: Orthopaedic Surgery

## 2024-04-14 DIAGNOSIS — M12811 Other specific arthropathies, not elsewhere classified, right shoulder: Secondary | ICD-10-CM | POA: Insufficient documentation

## 2024-04-14 DIAGNOSIS — Z01818 Encounter for other preprocedural examination: Secondary | ICD-10-CM | POA: Diagnosis present

## 2024-04-14 LAB — SURGICAL PCR SCREEN
MRSA, PCR: NEGATIVE
Staphylococcus aureus: NEGATIVE

## 2024-04-14 NOTE — Progress Notes (Signed)
      Enhanced Recovery after Surgery Enhanced Recovery after Surgery is a protocol used to improve the stress on your body and your recovery after surgery.  Patient Instructions  The night before surgery:  No food after midnight. ONLY clear liquids after midnight  The day of surgery (if you do NOT have diabetes):  Drink ONE (1) Pre-Surgery Clear Ensure as directed.   This drink was given to you during your hospital  pre-op appointment visit. The pre-op nurse will instruct you on the time to drink the  Pre-Surgery Ensure depending on your surgery time. Finish the drink at the designated time by the pre-op nurse.  Nothing else to drink after completing the  Pre-Surgery Clear Ensure.  The day of surgery (if you have diabetes): Drink ONE (1) Gatorade 2 (G2) as directed. This drink was given to you during your hospital  pre-op appointment visit.  The pre-op nurse will instruct you on the time to drink the   Gatorade 2 (G2) depending on your surgery time. Color of the Gatorade may vary. Red is not allowed. Nothing else to drink after completing the  Gatorade 2 (G2).         If you have questions, please contact your surgeon's office.  Surgical soap and BPO given to patient with instructions for use.  Patient verbalized understanding of instructions.

## 2024-04-19 ENCOUNTER — Ambulatory Visit (HOSPITAL_COMMUNITY)

## 2024-04-19 ENCOUNTER — Other Ambulatory Visit: Payer: Self-pay

## 2024-04-19 ENCOUNTER — Encounter (HOSPITAL_BASED_OUTPATIENT_CLINIC_OR_DEPARTMENT_OTHER)
Admission: RE | Disposition: A | Discharge status: Home / Self Care | Payer: Self-pay | Source: Home / Self Care | Attending: Orthopaedic Surgery

## 2024-04-19 ENCOUNTER — Ambulatory Visit (HOSPITAL_BASED_OUTPATIENT_CLINIC_OR_DEPARTMENT_OTHER)
Admission: RE | Admit: 2024-04-19 | Discharge: 2024-04-19 | Disposition: A | Discharge status: Home / Self Care | Attending: Orthopaedic Surgery | Admitting: Orthopaedic Surgery

## 2024-04-19 ENCOUNTER — Ambulatory Visit (HOSPITAL_BASED_OUTPATIENT_CLINIC_OR_DEPARTMENT_OTHER)

## 2024-04-19 DIAGNOSIS — I10 Essential (primary) hypertension: Secondary | ICD-10-CM | POA: Diagnosis not present

## 2024-04-19 DIAGNOSIS — M12811 Other specific arthropathies, not elsewhere classified, right shoulder: Secondary | ICD-10-CM

## 2024-04-19 DIAGNOSIS — Z5982 Transportation insecurity: Secondary | ICD-10-CM | POA: Diagnosis not present

## 2024-04-19 DIAGNOSIS — Z8249 Family history of ischemic heart disease and other diseases of the circulatory system: Secondary | ICD-10-CM | POA: Insufficient documentation

## 2024-04-19 DIAGNOSIS — J449 Chronic obstructive pulmonary disease, unspecified: Secondary | ICD-10-CM | POA: Diagnosis not present

## 2024-04-19 DIAGNOSIS — K219 Gastro-esophageal reflux disease without esophagitis: Secondary | ICD-10-CM | POA: Insufficient documentation

## 2024-04-19 DIAGNOSIS — Z87891 Personal history of nicotine dependence: Secondary | ICD-10-CM

## 2024-04-19 DIAGNOSIS — Z8711 Personal history of peptic ulcer disease: Secondary | ICD-10-CM | POA: Diagnosis not present

## 2024-04-19 DIAGNOSIS — Z59868 Other specified financial insecurity: Secondary | ICD-10-CM | POA: Diagnosis not present

## 2024-04-19 DIAGNOSIS — Z01818 Encounter for other preprocedural examination: Secondary | ICD-10-CM

## 2024-04-19 DIAGNOSIS — I471 Supraventricular tachycardia, unspecified: Secondary | ICD-10-CM | POA: Diagnosis not present

## 2024-04-19 DIAGNOSIS — M75101 Unspecified rotator cuff tear or rupture of right shoulder, not specified as traumatic: Secondary | ICD-10-CM | POA: Diagnosis not present

## 2024-04-19 DIAGNOSIS — Z8619 Personal history of other infectious and parasitic diseases: Secondary | ICD-10-CM | POA: Diagnosis not present

## 2024-04-19 DIAGNOSIS — G8929 Other chronic pain: Secondary | ICD-10-CM

## 2024-04-19 DIAGNOSIS — F418 Other specified anxiety disorders: Secondary | ICD-10-CM | POA: Diagnosis not present

## 2024-04-19 DIAGNOSIS — M797 Fibromyalgia: Secondary | ICD-10-CM | POA: Diagnosis not present

## 2024-04-19 HISTORY — DX: Essential (primary) hypertension: I10

## 2024-04-19 HISTORY — DX: Chronic obstructive pulmonary disease, unspecified: J44.9

## 2024-04-19 HISTORY — DX: Cardiac arrhythmia, unspecified: I49.9

## 2024-04-19 HISTORY — PX: REVERSE SHOULDER ARTHROPLASTY: SHX5054

## 2024-04-19 SURGERY — ARTHROPLASTY, SHOULDER, TOTAL, REVERSE
Anesthesia: Regional | Site: Shoulder | Laterality: Right

## 2024-04-19 MED ORDER — LACTATED RINGERS IV SOLN: INTRAVENOUS | Status: DC

## 2024-04-19 MED ORDER — TRANEXAMIC ACID-NACL 1000-0.7 MG/100ML-% IV SOLN
INTRAVENOUS | Status: AC
  Filled 2024-04-19: qty 100

## 2024-04-19 MED ORDER — ONDANSETRON HCL 4 MG/2ML IJ SOLN
INTRAMUSCULAR | Status: DC | PRN
  Administered 2024-04-19: 4 mg via INTRAVENOUS

## 2024-04-19 MED ORDER — BUPIVACAINE LIPOSOME 1.3 % IJ SUSP
INTRAMUSCULAR | Status: DC | PRN
  Administered 2024-04-19: 10 mL via PERINEURAL

## 2024-04-19 MED ORDER — GLYCOPYRROLATE 0.2 MG/ML IJ SOLN
INTRAMUSCULAR | Status: DC | PRN
  Administered 2024-04-19: .2 mg via INTRAVENOUS

## 2024-04-19 MED ORDER — PROPOFOL 10 MG/ML IV BOLUS
INTRAVENOUS | Status: DC | PRN
  Administered 2024-04-19: 100 mg via INTRAVENOUS

## 2024-04-19 MED ORDER — TRANEXAMIC ACID-NACL 1000-0.7 MG/100ML-% IV SOLN
1000.0000 mg | INTRAVENOUS | Status: AC
  Administered 2024-04-19: 1000 mg via INTRAVENOUS

## 2024-04-19 MED ORDER — FENTANYL CITRATE (PF) 100 MCG/2ML IJ SOLN
100.0000 ug | Freq: Once | INTRAMUSCULAR | Status: AC
  Administered 2024-04-19: 50 ug via INTRAVENOUS

## 2024-04-19 MED ORDER — FENTANYL CITRATE (PF) 100 MCG/2ML IJ SOLN
INTRAMUSCULAR | Status: AC
  Filled 2024-04-19: qty 2

## 2024-04-19 MED ORDER — MIDAZOLAM HCL 2 MG/2ML IJ SOLN
INTRAMUSCULAR | Status: AC
  Filled 2024-04-19: qty 2

## 2024-04-19 MED ORDER — GLYCOPYRROLATE PF 0.2 MG/ML IJ SOSY
PREFILLED_SYRINGE | INTRAMUSCULAR | Status: AC
  Filled 2024-04-19: qty 1

## 2024-04-19 MED ORDER — OXYCODONE HCL 5 MG PO TABS
ORAL_TABLET | ORAL | Status: AC
  Filled 2024-04-19: qty 1

## 2024-04-19 MED ORDER — EPHEDRINE SULFATE (PRESSORS) 25 MG/5ML IV SOSY
PREFILLED_SYRINGE | INTRAVENOUS | Status: DC | PRN
  Administered 2024-04-19: 10 mg via INTRAVENOUS

## 2024-04-19 MED ORDER — ROCURONIUM BROMIDE 10 MG/ML (PF) SYRINGE
PREFILLED_SYRINGE | INTRAVENOUS | Status: AC
  Filled 2024-04-19: qty 10

## 2024-04-19 MED ORDER — FENTANYL CITRATE (PF) 100 MCG/2ML IJ SOLN
INTRAMUSCULAR | Status: DC | PRN
  Administered 2024-04-19 (×4): 50 ug via INTRAVENOUS

## 2024-04-19 MED ORDER — SODIUM CHLORIDE 0.9 % IV SOLN
INTRAVENOUS | Status: AC | PRN
  Administered 2024-04-19: 1000 mL

## 2024-04-19 MED ORDER — OXYCODONE HCL 5 MG PO TABS: 5.0000 mg | ORAL_TABLET | ORAL | 0 refills | Status: DC | PRN

## 2024-04-19 MED ORDER — DROPERIDOL 2.5 MG/ML IJ SOLN: 0.6250 mg | Freq: Once | INTRAMUSCULAR | Status: DC | PRN

## 2024-04-19 MED ORDER — ACETAMINOPHEN 500 MG PO TABS: 1000.0000 mg | ORAL_TABLET | Freq: Once | ORAL | Status: DC

## 2024-04-19 MED ORDER — GABAPENTIN 300 MG PO CAPS: 300.0000 mg | ORAL_CAPSULE | Freq: Once | ORAL | Status: DC

## 2024-04-19 MED ORDER — OXYCODONE HCL 5 MG PO TABS
5.0000 mg | ORAL_TABLET | Freq: Once | ORAL | Status: AC | PRN
  Administered 2024-04-19: 5 mg via ORAL

## 2024-04-19 MED ORDER — 0.9 % SODIUM CHLORIDE (POUR BTL) OPTIME
TOPICAL | Status: DC | PRN
  Administered 2024-04-19: 1000 mL

## 2024-04-19 MED ORDER — EPHEDRINE 5 MG/ML INJ
INTRAVENOUS | Status: AC
  Filled 2024-04-19: qty 5

## 2024-04-19 MED ORDER — VANCOMYCIN HCL 1000 MG IV SOLR
INTRAVENOUS | Status: DC | PRN
  Administered 2024-04-19: 1000 mg via TOPICAL

## 2024-04-19 MED ORDER — BUPIVACAINE HCL (PF) 0.5 % IJ SOLN
INTRAMUSCULAR | Status: DC | PRN
  Administered 2024-04-19: 10 mL via PERINEURAL

## 2024-04-19 MED ORDER — ACETAMINOPHEN 500 MG PO TABS
ORAL_TABLET | ORAL | Status: AC
  Filled 2024-04-19: qty 2

## 2024-04-19 MED ORDER — ACETAMINOPHEN 500 MG PO TABS
1000.0000 mg | ORAL_TABLET | Freq: Once | ORAL | Status: AC
  Administered 2024-04-19: 1000 mg via ORAL

## 2024-04-19 MED ORDER — SUGAMMADEX SODIUM 200 MG/2ML IV SOLN
INTRAVENOUS | Status: DC | PRN
  Administered 2024-04-19: 200 mg via INTRAVENOUS

## 2024-04-19 MED ORDER — POVIDONE-IODINE 10 % EX SOLN
CUTANEOUS | Status: DC | PRN
  Administered 2024-04-19: 1 via TOPICAL

## 2024-04-19 MED ORDER — CEFAZOLIN SODIUM-DEXTROSE 2-4 GM/100ML-% IV SOLN
INTRAVENOUS | Status: AC
  Filled 2024-04-19: qty 100

## 2024-04-19 MED ORDER — CEFAZOLIN SODIUM-DEXTROSE 2-4 GM/100ML-% IV SOLN
2.0000 g | INTRAVENOUS | Status: AC
  Administered 2024-04-19: 2 g via INTRAVENOUS

## 2024-04-19 MED ORDER — GABAPENTIN 300 MG PO CAPS
ORAL_CAPSULE | ORAL | Status: AC
  Filled 2024-04-19: qty 1

## 2024-04-19 MED ORDER — ROCURONIUM 10MG/ML (10ML) SYRINGE FOR MEDFUSION PUMP - OPTIME
INTRAVENOUS | Status: DC | PRN
  Administered 2024-04-19: 10 mg via INTRAVENOUS
  Administered 2024-04-19: 40 mg via INTRAVENOUS

## 2024-04-19 MED ORDER — ACETAMINOPHEN 10 MG/ML IV SOLN: 1000.0000 mg | Freq: Once | INTRAVENOUS | Status: DC | PRN

## 2024-04-19 MED ORDER — FENTANYL CITRATE (PF) 100 MCG/2ML IJ SOLN: 25.0000 ug | INTRAMUSCULAR | Status: DC | PRN

## 2024-04-19 MED ORDER — LIDOCAINE HCL (CARDIAC) PF 100 MG/5ML IV SOSY
PREFILLED_SYRINGE | INTRAVENOUS | Status: DC | PRN
  Administered 2024-04-19: 60 mg via INTRATRACHEAL

## 2024-04-19 MED ORDER — OXYCODONE HCL 5 MG/5ML PO SOLN: 5.0000 mg | Freq: Once | ORAL | Status: AC | PRN

## 2024-04-19 MED ORDER — ONDANSETRON HCL 4 MG/2ML IJ SOLN
INTRAMUSCULAR | Status: AC
  Filled 2024-04-19: qty 2

## 2024-04-19 MED ORDER — DEXAMETHASONE SOD PHOSPHATE PF 10 MG/ML IJ SOLN
INTRAMUSCULAR | Status: DC | PRN
  Administered 2024-04-19: 5 mg via INTRAVENOUS

## 2024-04-19 SURGICAL SUPPLY — 58 items
AUGMENT BASEPLATE 15DEG 25 WDG (Joint) IMPLANT
BIT DRILL 3.2 PERIPHERAL SCREW (BIT) IMPLANT
BLADE SAW SGTL 73X25 THK (BLADE) ×2 IMPLANT
BLADE SURG 10 STRL SS (BLADE) IMPLANT
BLADE SURG 15 STRL LF DISP TIS (BLADE) IMPLANT
BRUSH SCRUB EZ PLAIN DRY (MISCELLANEOUS) IMPLANT
CHLORAPREP W/TINT 26 (MISCELLANEOUS) ×2 IMPLANT
CLSR STERI-STRIP ANTIMIC 1/2X4 (GAUZE/BANDAGES/DRESSINGS) IMPLANT
COOLER ICEMAN CLASSIC (MISCELLANEOUS) ×2 IMPLANT
COVER BACK TABLE 60X90IN (DRAPES) ×2 IMPLANT
COVER MAYO STAND STRL (DRAPES) ×2 IMPLANT
DERMABOND ADVANCED .7 DNX12 (GAUZE/BANDAGES/DRESSINGS) IMPLANT
DRAPE IMP U-DRAPE 54X76 (DRAPES) IMPLANT
DRAPE INCISE IOBAN 66X45 STRL (DRAPES) ×2 IMPLANT
DRAPE POUCH INSTRU U-SHP 10X18 (DRAPES) ×2 IMPLANT
DRAPE U-SHAPE 47X51 STRL (DRAPES) ×4 IMPLANT
DRAPE U-SHAPE 76X120 STRL (DRAPES) ×4 IMPLANT
DRSG AQUACEL AG ADV 3.5X10 (GAUZE/BANDAGES/DRESSINGS) ×2 IMPLANT
ELECTRODE BLDE 4.0 EZ CLN MEGD (MISCELLANEOUS) ×2 IMPLANT
ELECTRODE REM PT RTRN 9FT ADLT (ELECTROSURGICAL) ×2 IMPLANT
GAUZE XEROFORM 1X8 LF (GAUZE/BANDAGES/DRESSINGS) IMPLANT
GLOVE BIO SURGEON STRL SZ 6 (GLOVE) ×4 IMPLANT
GLOVE BIO SURGEON STRL SZ7.5 (GLOVE) ×4 IMPLANT
GLOVE BIOGEL PI IND STRL 6.5 (GLOVE) ×2 IMPLANT
GLOVE BIOGEL PI IND STRL 8 (GLOVE) ×2 IMPLANT
GOWN STRL REUS W/ TWL LRG LVL3 (GOWN DISPOSABLE) ×4 IMPLANT
GOWN STRL REUS W/TWL XL LVL3 (GOWN DISPOSABLE) ×2 IMPLANT
GUIDEWIRE GLENOID 2.5X220 (WIRE) IMPLANT
HEAD CANN REV SHLD PERF 33 +3 (Head) IMPLANT
INSERT REV SZ 1/2 (Insert) IMPLANT
KIT STABILIZATION SHOULDER (MISCELLANEOUS) ×2 IMPLANT
MANIFOLD NEPTUNE II (INSTRUMENTS) ×2 IMPLANT
PACK BASIN DAY SURGERY FS (CUSTOM PROCEDURE TRAY) ×2 IMPLANT
PACK SHOULDER (CUSTOM PROCEDURE TRAY) ×2 IMPLANT
PAD COLD SHLDR WRAP-ON (PAD) ×2 IMPLANT
PIN GUIDE 3X75 SHOULDER (PIN) IMPLANT
RESTRAINT HEAD UNIVERSAL NS (MISCELLANEOUS) ×2 IMPLANT
SCREW 5.0X18 (Screw) IMPLANT
SCREW 5.5X26 (Screw) IMPLANT
SCREW BONE INTRNL SM 7 (Screw) IMPLANT
SCREW PERIPHERAL 30 (Screw) IMPLANT
SET HNDPC FAN SPRY TIP SCT (DISPOSABLE) ×2 IMPLANT
SHEET MEDIUM DRAPE 40X70 STRL (DRAPES) ×2 IMPLANT
SLEEVE SCD COMPRESS KNEE MED (STOCKING) ×2 IMPLANT
SLING ARM FOAM STRAP MED (SOFTGOODS) IMPLANT
SPIKE FLUID TRANSFER (MISCELLANEOUS) IMPLANT
SPONGE T-LAP 18X18 ~~LOC~~+RFID (SPONGE) ×2 IMPLANT
STEM HUMERAL SHORT STD SZ1 (Orthopedic Implant) IMPLANT
SUCTION TUBE FRAZIER 10FR DISP (SUCTIONS) ×2 IMPLANT
SUT ETHIBOND 2 V 37 (SUTURE) ×2 IMPLANT
SUT ETHILON 3 0 PS 1 (SUTURE) IMPLANT
SUT MNCRL AB 3-0 PS2 27 (SUTURE) ×2 IMPLANT
SUT VIC AB 0 CT1 27XBRD ANBCTR (SUTURE) ×4 IMPLANT
SUT VIC AB 2-0 CT1 TAPERPNT 27 (SUTURE) ×4 IMPLANT
SUT VIC AB 3-0 SH 27X BRD (SUTURE) IMPLANT
SYR 50ML LL SCALE MARK (SYRINGE) ×2 IMPLANT
TOWEL GREEN STERILE FF (TOWEL DISPOSABLE) ×6 IMPLANT
TUBE SUCTION HIGH CAP CLEAR NV (SUCTIONS) ×2 IMPLANT

## 2024-04-19 NOTE — Anesthesia Procedure Notes (Signed)
 Anesthesia Regional Block: Interscalene brachial plexus block   Pre-Anesthetic Checklist: , timeout performed,  Correct Patient, Correct Site, Correct Laterality,  Correct Procedure, Correct Position, site marked,  Risks and benefits discussed,  Surgical consent,  Pre-op evaluation,  At surgeon's request and post-op pain management  Laterality: Right  Prep: chloraprep       Needles:  Injection technique: Single-shot  Needle Type: Echogenic Stimulator Needle     Needle Length: 9cm  Needle Gauge: 21     Additional Needles:   Procedures:,,,, ultrasound used (permanent image in chart),,    Narrative:  Start time: 04/19/2024 12:45 PM End time: 04/19/2024 12:50 PM Injection made incrementally with aspirations every 5 mL.  Performed by: Personally  Anesthesiologist: Erma Thom SAUNDERS, MD  Additional Notes: Discussed risks and benefits of the nerve block in detail, including but not limited vascular injury, permanent nerve damage and infection.   Patient tolerated the procedure well. Local anesthetic introduced in an incremental fashion under minimal resistance after negative aspirations. No paresthesias were elicited. After completion of the procedure, no acute issues were identified and patient continued to be monitored by RN.

## 2024-04-19 NOTE — Op Note (Signed)
 Date of Surgery: 04/19/2024  INDICATIONS: Ms. Gadea is a 68 y.o.-year-old female with right shoulder rotator cuff arthropathy.  The risk and benefits of the procedure were discussed in detail and documented in the pre-operative evaluation.   PREOPERATIVE DIAGNOSIS: 1. Right shoulder rotator cuff arthropathy  POSTOPERATIVE DIAGNOSIS: Same.  PROCEDURE: 1. Right shoulder reverse shoulder arthroplasty  SURGEON: Elspeth LITTIE Parker MD  ASSISTANT: Conley Dawson, ATC  ANESTHESIA:  general  IV FLUIDS AND URINE: See anesthesia record.  ANTIBIOTICS: Ancef   ESTIMATED BLOOD LOSS: 50 mL.  IMPLANTS:  Implant Name Type Inv. Item Serial No. Manufacturer Lot No. LRB No. Used Action  SCREW PERIPHERAL 30 - [id number] Screw SCREW PERIPHERAL 30  [employer] ON STERILE TRAY Right 2 Implanted  SCREW 5.0X18 - [id number] Screw SCREW 5.0X18  [employer] ON STERILE TRAY Right 1 Implanted  SCREW 5.5X26 - [id number] Screw SCREW 5.5X26  [employer] ON STERILE TRAY Right 1 Implanted  AUGMENT BASEPLATE 15DEG 25 WDG - [id number] Joint AUGMENT BASEPLATE 15DEG 25 WDG [id number] [employer]  Right 1 Implanted  SCREW BONE INTRNL SM 7 - [id number] Screw SCREW BONE INTRNL SM 7  [employer] [id number] Right 1 Implanted    DRAINS: None  CULTURES: None  COMPLICATIONS: none  DESCRIPTION OF PROCEDURE:   Patient was identified in the preoperative holding area.  Anesthesia performed an interscalene nerve block after universal timeout was performed with nursing.  Ancef  was given 1 hour prior to skin incision.    The surgical site was scrubbed with a chlorhexidine  scrub brush and alcohol.  The patient was then prepped with chlorhexidine  skin prep.  The patient was subsequently taken back to the operating room.  Anesthesia was induced. The patient was transferred to the beachchair position.  All bony prominences were padded.  Final timeout was again performed.     The bony landmarks of the shoulder were marked with a  marking pen. A delto-pectoral incision was made, extending up approximately 5 inches. The wound with then irrigated with dilute betadine. Cephalic vein was identified, and an protected. This was retracted medially. Subdeltoid and subpectoral lesions were released. Neurovascular structures were carefully protected. The Gelpi retractor was used to retract the deltoid and pectoralis major.    The deltoid was retracted laterally with a Brown humeral retractor.  The conjoined tendon was identified. The cleido-pectoral fascia was excised.  The axillary nerve was palpated and carefully protected throughout the procedure. The biceps tendon was found and tenodesed to the upper pec with # 2 Ethibond non-absorbable suture.  Proximally the biceps tendon was removed up to the joint.  The bicipital groove was used for a landmark to establish rotator cuff interval. The subscap was tagged with a #2 Ethibond.  At this point the subscapularis was peeled off from the lesser tuberosity with care to avoid dissection distally in order to protect the axillary nerve.  Once the joint was exposed the proximal humerus was delivered with external rotation and extension of the arm. The humerus was prepped initially by performing a humeral neck cut. This was done with the guide using 30 degrees of retroversion as a reference.  The head portion was removed.  A medullary sounding reamer was then used.  We subsequently placed our guidewire through the center of the humeral head using the reference guide.  This was a size 1.  Metaphyseal reamer was then used.  Finally the size 1 broach was malleted into place with excellent purchase.  A tonsil clamp was used  to attempt to pull this out with very good purchase   Attention was then turned to the glenoid.  Posteriorly a large Darach retractor was used.  A 360 Degree release of the subscapularis and glenoid were done. The capsule was released from the humerus.    Glenoid retractors were placed  posteriorly, superiorly behind the biceps tendon and anteriorly on the glenoid neck. A 360-degree release of the capsule was performed with cautery.  The triceps was released off the inferior tubercle of the glenoid. The axillary nerve was carefully protected with the surgeon's index finger, retracting it and using cautery.   A guidepin was placed through the glenoid guide. The guidepin was drilled until it exited the cortex. The guidepin was over drilled. Next, the glenoid was prepared with the reamer down to cortical bone.  The central peg hole was totally within the scapular neck tested with the probe.  The baseplate was then placed screwed securely with good purchase in position and then secured with 4 screws. In each case, they were drilled and measured and the appropriate length screw placed with excellent rigid fixation of the baseplate. The glenosphere was placed with size based based on pre-operative templating.   The humerus was then delivered and a neutral polyethylene trial was placed.  This was brought to just the level of the reduction but not completely reduced.  A 40final poly was selected and impacted.    Appropriate tension was noted on the conjoined tendon and deltoid muscle.  Extension was stable, external and internal rotation as well.  The subscap was pulled over but as this was not able to reach comfortably decision was made not to repair in order to prevent limited in external rotation.  The wound was then irrigated. Vancomycin powder was placed in the wound again for infection prevention.   The wound was then closed in layers with 0 Vicryl interrupted in the deep subcu followed by 2-0 Vicryl in the superficial subcu and 3-0 nylon for skin.  An Aquacel dressing was applied as well as an Veterinary surgeon.  A shoulder immobilizer was applied.     Postoperative Plan: -The patient will begin the reverse shoulder rehab protocol  -Aspirin  325 mg daily will be used for 4 weeks for blood clot  prevention -I will see the patient back in 2 weeks for first postoperative wound check      Elspeth LITTIE Parker, MD 2:47 PM

## 2024-04-19 NOTE — Anesthesia Preprocedure Evaluation (Addendum)
 Anesthesia Evaluation  Patient identified by MRN, date of birth, ID band Patient awake    Reviewed: Allergy & Precautions, H&P , NPO status , Patient's Chart, lab work & pertinent test results  History of Anesthesia Complications Negative for: history of anesthetic complications  Airway Mallampati: II  TM Distance: >3 FB Neck ROM: Full    Dental no notable dental hx.    Pulmonary COPD, former smoker   Pulmonary exam normal breath sounds clear to auscultation       Cardiovascular hypertension, (-) angina (-) Past MI Normal cardiovascular exam+ dysrhythmias Supra Ventricular Tachycardia  Rhythm:Regular Rate:Normal     Neuro/Psych neg Seizures PSYCHIATRIC DISORDERS Anxiety Depression    negative neurological ROS     GI/Hepatic PUD,GERD  ,,(+) Hepatitis -, C  Endo/Other  negative endocrine ROS    Renal/GU negative Renal ROS  negative genitourinary   Musculoskeletal  (+) Arthritis ,  Fibromyalgia -  Abdominal   Peds negative pediatric ROS (+)  Hematology  (+) Blood dyscrasia, anemia   Anesthesia Other Findings   Reproductive/Obstetrics negative OB ROS                              Anesthesia Physical Anesthesia Plan  ASA: 3  Anesthesia Plan: General and Regional   Post-op Pain Management: Regional block* and Tylenol  PO (pre-op)*   Induction: Intravenous  PONV Risk Score and Plan: 3 and Ondansetron , Dexamethasone  and Treatment may vary due to age or medical condition  Airway Management Planned: Oral ETT  Additional Equipment: None  Intra-op Plan:   Post-operative Plan: Extubation in OR  Informed Consent: I have reviewed the patients History and Physical, chart, labs and discussed the procedure including the risks, benefits and alternatives for the proposed anesthesia with the patient or authorized representative who has indicated his/her understanding and acceptance.      Dental advisory given  Plan Discussed with: CRNA  Anesthesia Plan Comments:          Anesthesia Quick Evaluation

## 2024-04-19 NOTE — Transfer of Care (Signed)
 Immediate Anesthesia Transfer of Care Note  Patient: [name]  Procedure(s) Performed: ARTHROPLASTY, SHOULDER, TOTAL, REVERSE (Right: Shoulder)  Patient Location: PACU  Anesthesia Type:General  Level of Consciousness: awake and alert   Airway & Oxygen Therapy: Patient Spontanous Breathing and Patient connected to face mask oxygen  Post-op Assessment: Report given to RN and Post -op Vital signs reviewed and stable  Post vital signs: Reviewed and stable  Last Vitals:  Vitals Value Taken Time  BP    Temp    Pulse 77 04/19/24 15:18  Resp 15 04/19/24 15:18  SpO2 98 % 04/19/24 15:18  Vitals shown include unfiled device data.  Last Pain:  Vitals:   04/19/24 1203  TempSrc: Tympanic  PainSc: 6       Patients Stated Pain Goal: 8 (04/19/24 1203)  Complications: No notable events documented.

## 2024-04-19 NOTE — Anesthesia Procedure Notes (Signed)
 Procedure Name: Intubation Date/Time: 04/19/2024 1:53 PM  Performed by: Lenard Lacks, CRNAPre-anesthesia Checklist: Patient identified, Emergency Drugs available, Suction available and Patient being monitored Patient Re-evaluated:Patient Re-evaluated prior to induction Oxygen Delivery Method: Circle system utilized Preoxygenation: Pre-oxygenation with 100% oxygen Induction Type: IV induction Ventilation: Mask ventilation without difficulty Laryngoscope Size: Miller and 2 Grade View: Grade II Tube type: Oral Number of attempts: 1 Airway Equipment and Method: Stylet Placement Confirmation: ETT inserted through vocal cords under direct vision, positive ETCO2 and breath sounds checked- equal and bilateral Tube secured with: Tape Dental Injury: Teeth and Oropharynx as per pre-operative assessment

## 2024-04-19 NOTE — Brief Op Note (Signed)
   Brief Op Note  Date of Surgery: 04/19/2024  Preoperative Diagnosis: RIGHT ROTATOR CUFF ARTHROPATHY  Postoperative Diagnosis: same  Procedure: Procedure(s): ARTHROPLASTY, SHOULDER, TOTAL, REVERSE  Implants: Implant Name Type Inv. Item Serial No. Manufacturer Lot No. LRB No. Used Action  SCREW PERIPHERAL 30 - [id number] Screw SCREW PERIPHERAL 30  [employer] ON STERILE TRAY Right 2 Implanted  SCREW 5.0X18 - [id number] Screw SCREW 5.0X18  [employer] ON STERILE TRAY Right 1 Implanted  SCREW 5.5X26 - [id number] Screw SCREW 5.5X26  [employer] ON STERILE TRAY Right 1 Implanted  AUGMENT BASEPLATE 15DEG 25 WDG - [id number] Joint AUGMENT BASEPLATE 15DEG 25 WDG [id number] [employer]  Right 1 Implanted  SCREW BONE INTRNL SM 7 - [id number] Screw SCREW BONE INTRNL SM 7  [employer] [id number] Right 1 Implanted    Surgeons: Surgeon(s): Genelle Standing, MD  Anesthesia: General    Estimated Blood Loss: See anesthesia record  Complications: None  Condition to PACU: Stable  Standing LITTIE Genelle, MD 04/19/2024 2:47 PM

## 2024-04-19 NOTE — Discharge Instructions (Addendum)
 Discharge Instructions    Attending Surgeon: Elspeth Parker, MD Office Phone Number: [phone]   Diagnosis and Procedures:    Surgeries Performed: Right shoulder reverse shoulder arthroplasty  Discharge Plan:    Diet: Resume usual diet. Begin with light or bland foods.  Drink plenty of fluids.  Activity:  Keep sling and dressing in place until your follow up visit in Physical Therapy You are advised to go home directly from the hospital or surgical center. Restrict your activities.  GENERAL INSTRUCTIONS: 1.  Keep your surgical site elevated above your heart for at least 5-7 days or longer to prevent swelling. This will improve your comfort and your overall recovery following surgery.     2. Please call Dr. Danetta office at [phone] with questions Monday-Friday during business hours. If no one answers, please leave a message and someone should get back to the patient within 24 hours. For emergencies please call 911 or proceed to the emergency room.   3. Patient to notify surgical team if experiences any of the following: Bowel/Bladder dysfunction, uncontrolled pain, nerve/muscle weakness, incision with increased drainage or redness, nausea/vomiting and Fever greater than 101.0 F.  Be alert for signs of infection including redness, streaking, odor, fever or chills. Be alert for excessive pain or bleeding and notify your surgeon immediately.  WOUND INSTRUCTIONS:   Leave your dressing/cast/splint in place until your post operative visit.  Keep it clean and dry.  Always keep the incision clean and dry until the staples/sutures are removed. If there is no drainage from the incision you should keep it open to air. If there is drainage from the incision you must keep it covered at all times until the drainage stops  Do not soak in a bath tub, hot tub, pool, lake or other body of water  until 21 days after your surgery and your incision is completely dry and healed.  If you  have removable sutures (or staples) they must be removed 10-14 days (unless otherwise instructed) from the day of your surgery.     1)  Elevate the extremity as much as possible.  2)  Keep the dressing clean and dry.  3)  Please call us  if the dressing becomes wet or dirty.  4)  If you are experiencing worsening pain or worsening swelling, please call.     MEDICATIONS: Resume all previous home medications at the previous prescribed dose and frequency unless otherwise noted Start taking the  pain medications on an as-needed basis as prescribed  Please taper down pain medication over the next week following surgery.  Ideally you should not require a refill of any narcotic pain medication.  Take pain medication with food to minimize nausea. In addition to the prescribed pain medication, you may take over-the-counter pain relievers such as Tylenol .  Do NOT take additional tylenol  if your pain medication already has tylenol  in it.  Aspirin  325mg  daily per bottle instructions. Narcotic Policy: Per [facility] clinic policy, our goal is ensure optimal postoperative pain control with a multimodal pain management strategy. For all [facility] patients, our goal is to wean post-operative narcotic medications by 6 weeks post-operatively, and many times sooner. If this is not possible due to utilization of pain medication prior to surgery, your [facility] doctor will support your acute post-operative pain control for the first 6 weeks postoperatively, with a plan to transition you back to your primary pain team following that. Maralee will work to ensure a therapist, occupational.  FOLLOWUP INSTRUCTIONS: 1. Follow up at the Physical Therapy Clinic 3-4 days following surgery. This appointment should be scheduled unless other arrangements have been made.The Physical Therapy scheduling number is [phone] if an appointment has not already been arranged.  2. Contact Dr. Danetta office during office hours at  [phone] or the practice after hours line at [phone] for non-emergencies. For medical emergencies call 911.   Discharge Location: Home  Post Anesthesia Home Care Instructions  Activity: Get plenty of rest for the remainder of the day. A responsible individual must stay with you for 24 hours following the procedure.  For the next 24 hours, DO NOT: -Drive a car -Advertising copywriter -Drink alcoholic beverages -Take any medication unless instructed by your physician -Make any legal decisions or sign important papers.  Meals: Start with liquid foods such as gelatin or soup. Progress to regular foods as tolerated. Avoid greasy, spicy, heavy foods. If nausea and/or vomiting occur, drink only clear liquids until the nausea and/or vomiting subsides. Call your physician if vomiting continues.  Special Instructions/Symptoms: Your throat may feel dry or sore from the anesthesia or the breathing tube placed in your throat during surgery. If this causes discomfort, gargle with warm salt water . The discomfort should disappear within 24 hours.  If you had a scopolamine patch placed behind your ear for the management of post- operative nausea and/or vomiting:  1. The medication in the patch is effective for 72 hours, after which it should be removed.  Wrap patch in a tissue and discard in the trash. Wash hands thoroughly with soap and water . 2. You may remove the patch earlier than 72 hours if you experience unpleasant side effects which may include dry mouth, dizziness or visual disturbances. 3. Avoid touching the patch. Wash your hands with soap and water  after contact with the patch.   Regional Anesthesia Blocks  1. You may not be able to move or feel the blocked extremity after a regional anesthetic block. This may last may last from 3-48 hours after placement, but it will go away. The length of time depends on the medication injected and your individual response to the medication. As the  nerves start to wake up, you may experience tingling as the movement and feeling returns to your extremity. If the numbness and inability to move your extremity has not gone away after 48 hours, please call your surgeon.   2. The extremity that is blocked will need to be protected until the numbness is gone and the strength has returned. Because you cannot feel it, you will need to take extra care to avoid injury. Because it may be weak, you may have difficulty moving it or using it. You may not know what position it is in without looking at it while the block is in effect.  3. For blocks in the legs and feet, returning to weight bearing and walking needs to be done carefully. You will need to wait until the numbness is entirely gone and the strength has returned. You should be able to move your leg and foot normally before you try and bear weight or walk. You will need someone to be with you when you first try to ensure you do not fall and possibly risk injury.  4. Bruising and tenderness at the needle site are common side effects and will resolve in a few days.  5. Persistent numbness or new problems with movement should be communicated to the surgeon or the [facility] (  [phone])/ [facility] [phone]).Information for Discharge Teaching: EXPAREL  (bupivacaine  liposome injectable suspension)   Pain relief is important to your recovery. The goal is to control your pain so you can move easier and return to your normal activities as soon as possible after your procedure. Your physician may use several types of medicines to manage pain, swelling, and more.  Your surgeon or anesthesiologist gave you EXPAREL (bupivacaine ) to help control your pain after surgery.  EXPAREL  is a local anesthetic designed to release slowly over an extended period of time to provide pain relief by numbing the tissue around the surgical site. EXPAREL  is designed to release pain medication over  time and can control pain for up to 72 hours. Depending on how you respond to EXPAREL , you may require less pain medication during your recovery. EXPAREL  can help reduce or eliminate the need for opioids during the first few days after surgery when pain relief is needed the most. EXPAREL  is not an opioid and is not addictive. It does not cause sleepiness or sedation.   Important! A teal colored band has been placed on your arm with the date, time and amount of EXPAREL  you have received. Please leave this armband in place for the full 96 hours following administration, and then you may remove the band. If you return to the hospital for any reason within 96 hours following the administration of EXPAREL , the armband provides important information that your health care providers to know, and alerts them that you have received this anesthetic.    Possible side effects of EXPAREL : Temporary loss of sensation or ability to move in the area where medication was injected. Nausea, vomiting, constipation Rarely, numbness and tingling in your mouth or lips, lightheadedness, or anxiety may occur. Call your doctor right away if you think you may be experiencing any of these sensations, or if you have other questions regarding possible side effects.  Follow all other discharge instructions given to you by your surgeon or nurse. Eat a healthy diet and drink plenty of water  or other fluids.   No tylenol  until 6 pm Last dose of oxy was 3:40pm\

## 2024-04-19 NOTE — H&P (Signed)
 Follow-up       Interval History:   01/29/2024: Presents today for follow-up bilateral shoulder MRIs.   Presents today with bilateral shoulder pain which has been ongoing now for several years.  She is having pain with overhead motion.  She does experience crepitus in both shoulders.  She is having a hard time weightbearing with a walker as a result of the shoulder pain.  She cannot lay directly on either side.  She is doing well from a left hip perspective following cephalomedullary nailing   PMH/PSH/Family History/Social History/Meds/Allergies:         Past Medical History:  Diagnosis Date   Allergy      Phreesia 03/21/2020   Anemia      Phreesia 03/21/2020   Anxiety     Arthritis      Phreesia 03/21/2020   Blood transfusion without reported diagnosis      Phreesia 03/21/2020   Cancer (HCC)      right breast cancer   Depression     Depression      Phreesia 03/21/2020   Fibromyalgia     GERD (gastroesophageal reflux disease)      Phreesia 03/21/2020   Heavy smoker     Hepatitis C      Pt was given too much interferon - critical care x2d, 5 days total hosp   Neuromuscular disorder (HCC)      Phreesia 03/21/2020   Osteoporosis     Perimenopausal 11/18/2003    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), hypoglycemia 2/04-resolved with PO calcium  + D, SVT, was treated with iterferon and ribavirin-10/3-2/04   Personal history of chemotherapy     PUD (peptic ulcer disease)     Rheumatoid arthritis(714.0)      possible   SVT (supraventricular tachycardia) (HCC)     Wears glasses               Past Surgical History:  Procedure Laterality Date   ABDOMINAL HYSTERECTOMY       BREAST SURGERY N/A      Phreesia 03/21/2020   CESAREAN SECTION   09/17/1980   CESAREAN SECTION N/A      Phreesia 03/21/2020   COLONOSCOPY       DILATION AND CURETTAGE OF UTERUS       hysterectomy-for dysmenorrhea    09/17/2000   INTRAMEDULLARY (IM) NAIL INTERTROCHANTERIC Left 05/21/2023    Procedure: INTRAMEDULLARY (IM) NAIL INTERTROCHANTERIC WITH HARDWARE REMOVAL;  Surgeon: Genelle Standing, MD;  Location: [facility];  Service: Orthopedics;  Laterality: Left;   JOINT REPLACEMENT N/A      Phreesia 03/21/2020   MASTECTOMY Right 2016   MASTECTOMY W/ SENTINEL NODE BIOPSY Right 07/28/2014    Procedure: RIGHT MASTECTOMY WITH SENTINEL LYMPH NODE BIOPSY ;  Surgeon: Jina Nephew, MD;  Location: [facility];  Service: General;  Laterality: Right;   NASAL SINUS SURGERY   03/20/1984    for deviated septum   PORTACATH PLACEMENT Left 01/20/2014    Procedure: INSERTION PORT-A-CATH;  Surgeon: Jina Nephew, MD;  Location: [facility];  Service: General;  Laterality: Left;   TONSILLECTOMY   05/20/1961   UPPER GI ENDOSCOPY            Social History         Socioeconomic History   Marital status: Divorced      Spouse name: Not on file   Number of children: Not on file  Years of education: Not on file   Highest education level: Not on file  Occupational History   Occupation: disability  Tobacco Use   Smoking status: Every Day   Smokeless tobacco: Never   Tobacco comments:      Pt is currently vapping ARJ 12/06/22  Vaping Use   Vaping status: Every Day  Substance and Sexual Activity   Alcohol use: Not Currently      Comment: denies; reports 3-4 beers/night to Dr. 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     Hepatitis B & C    Has 2 dogs and 5 cats    Did  inpatient substance abuse rehab in 1993              Social Drivers of Health        Financial Resource Strain: Medium Risk (01/06/2023)    Received from [facility]    Overall Financial Resource Strain (CARDIA)     Difficulty of Paying Living Expenses: Somewhat hard  Food Insecurity: Patient Declined (05/22/2023)    Hunger Vital Sign     Worried About Running Out of Food in the Last Year: Patient declined     Ran Out of Food in the Last Year: Patient declined  Transportation Needs: Patient Declined (05/22/2023)    PRAPARE - Therapist, art (Medical): Patient declined     Lack of Transportation (Non-Medical): Patient declined  Recent Concern: Transportation Needs - Unmet Transportation Needs (03/25/2023)    Received from [facility] - Transportation     Lack of Transportation (Medical): Yes     Lack of Transportation (Non-Medical): Yes  Physical Activity: Insufficiently Active (01/06/2023)    Received from [facility]    Exercise Vital Sign     Days of Exercise per Week: 1 day     Minutes of Exercise per Session: 20 min  Stress: No Stress Concern Present (03/25/2023)    Received from [facility] - Occupational Stress Questionnaire     Feeling of Stress : Only a little  Recent Concern: Stress - Stress Concern Present (01/06/2023)    Received from [facility] - Occupational Stress Questionnaire     Feeling of Stress : Rather much  Social Connections: Patient Declined (05/22/2023)    Social Connection and Isolation Panel [NHANES]     Frequency of Communication with Friends and Family: Patient declined     Frequency of Social Gatherings with Friends and Family: Patient declined     Attends Religious Services: Patient declined     Database administrator or Organizations: Patient declined     Attends Banker Meetings: Patient declined      Marital Status: Patient declined         Family History  Problem Relation Age of Onset   Dementia Mother     Hypertension Mother  Hyperlipidemia Mother     Cancer Father 71        lung   Dementia Father     Breast cancer Maternal Aunt     Cancer Paternal Uncle     Breast cancer Cousin     Colon cancer Neg Hx          Allergies       Allergies  Allergen Reactions   Pegfilgrastim  Hives      Blistering rash all over body   Codeine Itching and Nausea Only      Pt has had codeine with pre-administration to prevent reaction without any problem.   Other Reaction(s): nausea and vomiting, itching            Current Outpatient Medications  Medication Sig Dispense Refill   acetaminophen  (TYLENOL ) 325 MG tablet Take 2 tablets (650 mg total) by mouth every 6 (six) hours as needed for mild pain (pain score 1-3), fever or headache.       ALPRAZolam  (XANAX ) 0.5 MG tablet Take 1 tablet (0.5 mg total) by mouth 2 (two) times daily as needed for anxiety. 10 tablet 0   aspirin  325 MG tablet Take 1 tablet (325 mg total) by mouth daily.       atenolol  (TENORMIN ) 50 MG tablet Take 1 tablet (50 mg total) by mouth daily. TAKE 1 TABLET(50 MG) BY MOUTH TWICE DAILY       B Complex-C (B-COMPLEX WITH VITAMIN C) tablet Take 1 tablet by mouth 2 (two) times a week.       famotidine  (PEPCID ) 40 MG tablet Take 40 mg by mouth at bedtime.       fluticasone  (FLONASE ) 50 MCG/ACT nasal spray Place 2 sprays into both nostrils daily. 16 g 6   gabapentin  (NEURONTIN ) 300 MG capsule Take 1 capsule (300 mg total) by mouth 2 (two) times daily.       letrozole  (FEMARA ) 2.5 MG tablet Take 2.5 mg by mouth daily.       melatonin 5 MG TABS Take 1 tablet (5 mg total) by mouth at bedtime as needed.       Misc Natural Products (TUMERSAID) TABS as directed Orally       Multiple Vitamins tablet Take by mouth.       Multiple Vitamins-Minerals (WOMENS MULTIVITAMIN PO) Take 1 tablet by mouth 3 (three) times a week.        ondansetron  (ZOFRAN ) 8 MG tablet Take 1 tablet (8 mg total) by mouth every 8 (eight) hours as needed for nausea or vomiting. (Patient not taking: Reported on 03/20/2023) 30 tablet 3   Oxycodone  HCl 10 MG TABS Take 1 tablet (10 mg total) by mouth every 6 (six) hours as needed for up to 14 days. 56 tablet 0   pantoprazole  (PROTONIX ) 40 MG tablet Take 40 mg by mouth 2 (two) times daily.       polyethylene glycol (MIRALAX  / GLYCOLAX ) 17 g packet Take 17 g by mouth daily as needed for moderate constipation.       predniSONE  (DELTASONE ) 10 MG tablet TAKE 1 TABLET(10 MG) BY MOUTH DAILY WITH BREAKFAST 90 tablet 0   sertraline  (ZOLOFT ) 50 MG tablet Take 25 mg by mouth at bedtime.       Tiotropium Bromide-Olodaterol (STIOLTO RESPIMAT ) 2.5-2.5 MCG/ACT AERS Inhale 2 puffs into the lungs daily. (Patient not taking: Reported on 03/20/2023) 1 each 5   Turmeric (QC TUMERIC COMPLEX PO) Take 1 tablet by mouth daily.  Vitamin D , Ergocalciferol , (DRISDOL ) 1.25 MG (50000 UNIT) CAPS capsule Take 1 capsule (50,000 Units total) by mouth every 7 (seven) days.          No current facility-administered medications for this visit.      Imaging Results (Last 48 hours)  No results found.     Review of Systems:   A ROS was performed including pertinent positives and negatives as documented in the HPI.     Musculoskeletal Exam:     There were no vitals taken for this visit.   Bilateral shoulder forward elevation is to 90 degrees with external rotation to 20 degrees.  Internal rotation is to back pocket.  Distal neurosensory exam is intact   Imaging:   Xray (3 views right shoulder, 3 views left shoulder): Proximal migration of the humeral head consistent with rotator cuff arthropathy   MRI right shoulder, MRI left shoulder: Full-thickness rotator cuff tearing with significant retraction and atrophy on T1 view bilaterally   I personally reviewed and interpreted the radiographs.     Assessment:    68 year old female with evidence of bilateral rotator cuff arthropathy which is limiting her ability to weight-bear with walker.  She has now trialed bilateral shoulder ultrasound-guided injections in the subacromial space which did give her temporary relief but is subsequently worn off.  We did discuss that on MRI she does have evidence of bilateral full-thickness nonrepairable rotator cuff tears and her right shoulder is still continuing to bother her.  Given this we did discuss the role for possible right shoulder reverse shoulder arthroplasty.  I did discuss risk limitation as well as associated recovery timeframe.  After discussion she would like to proceed Plan :     - Plan for right shoulder reverse shoulder arthroplasty   After a lengthy discussion of treatment options, including risks, benefits, alternatives, complications of surgical and nonsurgical conservative options, the patient elected surgical repair.   The patient  is aware of the material risks  and complications including, but not limited to injury to adjacent structures, neurovascular injury, infection, numbness, bleeding, implant failure, thermal burns, stiffness, persistent pain, failure to heal, disease transmission from allograft, need for further surgery, dislocation, anesthetic risks, blood clots, risks of death,and others. The probabilities of surgical success and failure discussed with patient given their particular co-morbidities.The time and nature of expected rehabilitation and recovery was discussed.The patient's questions were all answered preoperatively.  No barriers to understanding were noted. I explained the natural history of the disease process and Rx rationale.  I explained to the patient what I considered to be reasonable expectations given their personal situation.  The final treatment plan was arrived at through a shared patient decision making process model.          I personally saw and evaluated the  patient, and participated in the management and treatment plan.

## 2024-04-19 NOTE — Interval H&P Note (Signed)
 History and Physical Interval Note:  04/19/2024 1:30 PM  [name]  has presented today for surgery, with the diagnosis of RIGHT ROTATOR CUFF ARTHROPATHY.  The various methods of treatment have been discussed with the patient and family. After consideration of risks, benefits and other options for treatment, the patient has consented to  Procedure(s): ARTHROPLASTY, SHOULDER, TOTAL, REVERSE (Right) as a surgical intervention.  The patient's history has been reviewed, patient examined, no change in status, stable for surgery.  I have reviewed the patient's chart and labs.  Questions were answered to the patient's satisfaction.     Giovannina Mun

## 2024-04-19 NOTE — Progress Notes (Signed)
 Assisted Dr. Lyn Henri with right, interscalene , ultrasound guided block. Side rails up, monitors on throughout procedure. See vital signs in flow sheet. Tolerated Procedure well.

## 2024-04-20 ENCOUNTER — Other Ambulatory Visit (HOSPITAL_BASED_OUTPATIENT_CLINIC_OR_DEPARTMENT_OTHER): Payer: Self-pay | Admitting: Orthopaedic Surgery

## 2024-04-20 ENCOUNTER — Encounter (HOSPITAL_BASED_OUTPATIENT_CLINIC_OR_DEPARTMENT_OTHER): Payer: Self-pay | Admitting: Orthopaedic Surgery

## 2024-04-20 ENCOUNTER — Ambulatory Visit (INDEPENDENT_AMBULATORY_CARE_PROVIDER_SITE_OTHER)

## 2024-04-20 ENCOUNTER — Encounter (HOSPITAL_BASED_OUTPATIENT_CLINIC_OR_DEPARTMENT_OTHER): Admitting: Student

## 2024-04-20 ENCOUNTER — Encounter (HOSPITAL_BASED_OUTPATIENT_CLINIC_OR_DEPARTMENT_OTHER): Payer: Self-pay

## 2024-04-20 DIAGNOSIS — G8929 Other chronic pain: Secondary | ICD-10-CM

## 2024-04-20 DIAGNOSIS — M25511 Pain in right shoulder: Secondary | ICD-10-CM

## 2024-04-20 NOTE — Anesthesia Postprocedure Evaluation (Signed)
 Anesthesia Post Note  Patient: [name]  Procedure(s) Performed: ARTHROPLASTY, SHOULDER, TOTAL, REVERSE (Right: Shoulder)     Patient location during evaluation: PACU Anesthesia Type: Regional and General Level of consciousness: awake and alert Pain management: pain level controlled Vital Signs Assessment: post-procedure vital signs reviewed and stable Respiratory status: spontaneous breathing, nonlabored ventilation, respiratory function stable and patient connected to nasal cannula oxygen Cardiovascular status: blood pressure returned to baseline and stable Postop Assessment: no apparent nausea or vomiting Anesthetic complications: no   No notable events documented.  Last Vitals:  Vitals:   04/19/24 1530 04/19/24 1543  BP: 119/68 129/60  Pulse: 74 66  Resp: 14 20  Temp:  36.7 C  SpO2: 96% 94%    Last Pain:  Vitals:   04/19/24 1543  TempSrc: Temporal  PainSc: 4                  Thom JONELLE Peoples

## 2024-04-26 ENCOUNTER — Encounter (HOSPITAL_BASED_OUTPATIENT_CLINIC_OR_DEPARTMENT_OTHER): Payer: Self-pay | Admitting: Orthopaedic Surgery

## 2024-04-26 ENCOUNTER — Other Ambulatory Visit: Payer: Self-pay | Admitting: Orthopaedic Surgery

## 2024-04-26 MED ORDER — OXYCODONE HCL 5 MG PO TABS: 5.0000 mg | ORAL_TABLET | ORAL | 0 refills | Status: DC | PRN

## 2024-04-30 ENCOUNTER — Ambulatory Visit (INDEPENDENT_AMBULATORY_CARE_PROVIDER_SITE_OTHER): Admitting: Orthopaedic Surgery

## 2024-04-30 ENCOUNTER — Ambulatory Visit (HOSPITAL_BASED_OUTPATIENT_CLINIC_OR_DEPARTMENT_OTHER)

## 2024-04-30 DIAGNOSIS — G8929 Other chronic pain: Secondary | ICD-10-CM

## 2024-04-30 DIAGNOSIS — M25511 Pain in right shoulder: Secondary | ICD-10-CM

## 2024-04-30 NOTE — Addendum Note (Signed)
 Addended by: WOLFGANG CONLEY HERO on: 04/30/2024 01:03 PM   Modules accepted: Orders

## 2024-04-30 NOTE — Progress Notes (Signed)
 Post Operative Evaluation    Procedure/Date of Surgery: Right shoulder reverse arthroplasty 12/1  Interval History:   Presents 2 weeks status post the above procedure.  Overall she is doing well.  She has been going to physical therapy.   PMH/PSH/Family History/Social History/Meds/Allergies:    Past Medical History:  Diagnosis Date   Allergy    Phreesia 03/21/2020   Anemia    Phreesia 03/21/2020   Anxiety    Arthritis    Phreesia 03/21/2020   Blood transfusion without reported diagnosis    Phreesia 03/21/2020   Cancer (HCC) 2016   right breast cancer-mastectomy   COPD (chronic obstructive pulmonary disease) (HCC)    Depression    Depression    Phreesia 03/21/2020   Dysrhythmia    SVT   Fibromyalgia    GERD (gastroesophageal reflux disease)    Phreesia 03/21/2020   Heavy smoker    Hepatitis C    Pt was given too much interferon - critical care x2d, 5 days total hosp   Hypertension    Neuromuscular disorder (HCC)    Phreesia 03/21/2020   Osteoporosis    Perimenopausal 11/18/2003   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), hypoglycemia 2/04-resolved with PO calcium  + D, SVT, was treated with iterferon and ribavirin-10/3-2/04   Personal history of chemotherapy    PUD (peptic ulcer disease)    Rheumatoid arthritis(714.0)    possible   SVT (supraventricular tachycardia)    Wears glasses    Past Surgical History:  Procedure Laterality Date   ABDOMINAL HYSTERECTOMY     BREAST SURGERY N/A    Phreesia 03/21/2020   CESAREAN SECTION  09/17/1980   CESAREAN SECTION N/A    Phreesia 03/21/2020   COLONOSCOPY     DILATION AND CURETTAGE OF UTERUS     hysterectomy-for dysmenorrhea  09/17/2000   INTRAMEDULLARY (IM) NAIL INTERTROCHANTERIC Left 05/21/2023   Procedure: INTRAMEDULLARY (IM) NAIL INTERTROCHANTERIC WITH HARDWARE REMOVAL;  Surgeon: Genelle Standing, MD;  Location: [facility];   Service: Orthopedics;  Laterality: Left;   JOINT REPLACEMENT N/A    Phreesia 03/21/2020   MASTECTOMY Right 2016   MASTECTOMY W/ SENTINEL NODE BIOPSY Right 07/28/2014   Procedure: RIGHT MASTECTOMY WITH SENTINEL LYMPH NODE BIOPSY ;  Surgeon: Jina Nephew, MD;  Location: [facility];  Service: General;  Laterality: Right;   NASAL SINUS SURGERY  03/20/1984   for deviated septum   PORTACATH PLACEMENT Left 01/20/2014   Procedure: INSERTION PORT-A-CATH;  Surgeon: Jina Nephew, MD;  Location: [facility];  Service: General;  Laterality: Left;   REVERSE SHOULDER ARTHROPLASTY Right 04/19/2024   Procedure: ARTHROPLASTY, SHOULDER, TOTAL, REVERSE;  Surgeon: Genelle Standing, MD;  Location: [facility];  Service: Orthopedics;  Laterality: Right;   TONSILLECTOMY  05/20/1961   UPPER GI ENDOSCOPY     Social History   Socioeconomic History   Marital status: Single    Spouse name: Not on file   Number of children: Not on file   Years of education: Not on file   Highest education level: Not on file  Occupational History   Occupation: disability  Tobacco Use   Smoking status: Former    Types: Cigarettes   Smokeless tobacco: Never   Tobacco comments:    Pt is currently vapping ARJ  12/06/22  Vaping Use   Vaping status: Every Day   Substances: Nicotine   Substance and Sexual Activity   Alcohol use: Not Currently    Comment: denies; reports 3-4 beers/night to Dr. 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 employed as print production planner at [employer].. GED plus 1 yr of technical college    Hepatitis B & C   Has 2 dogs and  5 cats   Did inpatient substance abuse rehab in 1993         Social Drivers of Health   Tobacco Use: Medium Risk (04/07/2024)   Patient History    Smoking Tobacco Use: Former    Smokeless Tobacco Use: Never    Passive Exposure: Not on file  Financial Resource Strain: Medium Risk (01/27/2024)   Received from [facility]   Overall Financial Resource Strain (CARDIA)    How hard is it for you to pay for the very basics like food, housing, medical care, and heating?: Somewhat hard  Food Insecurity: No Food Insecurity (01/27/2024)   Received from [facility]   Epic    Within the past 12 months, you worried that your food would run out before you got the money to buy more.: Never true    Within the past 12 months, the food you bought just didn't last and you didn't have money to get more.: Never true  Transportation Needs: No Transportation Needs (01/27/2024)   Received from [facility]    In the past 12 months, has lack of transportation kept you from medical appointments or from getting medications?: No    In the past 12 months, has lack of transportation kept you from meetings, work, or from getting things needed for daily living?: No  Physical Activity: Inactive (01/27/2024)   Received from [facility]   Exercise Vital Sign    On average, how many days per week do you engage in moderate to strenuous exercise (like a brisk walk)?: 0 days    Minutes of Exercise per Session: Not on file  Stress: Stress Concern Present (01/27/2024)   Received from [facility] - Occupational Stress Questionnaire    Do you feel stress - tense, restless, nervous, or anxious, or unable to sleep at night because your mind is troubled all the time - these days?: To some extent  Social Connections: Moderately Integrated (01/27/2024)   Received from [facility]   Social Network    How would you rate your social network (family, work, friends)?: Adequate  participation with social networks  Depression (PHQ2-9): Not on file  Alcohol Screen: Not on file  Housing: Low Risk (01/27/2024)   Received from [facility]    In the last 12 months, was there a time when you were not able to pay the mortgage or rent on time?: No    In the past 12 months, how many times have you moved where you were living?: 0    At any time in the past 12 months, were you homeless or living in a shelter (including  now)?: No  Utilities: Not At Risk (01/27/2024)   Received from [facility]    In the past 12 months has the electric, gas, oil, or water  company threatened to shut off services in your home?: No  Health Literacy: Not on file   Family History  Problem Relation Age of Onset   Dementia Mother    Hypertension Mother    Hyperlipidemia Mother    Cancer Father 4       lung   Dementia Father    Breast cancer Maternal Aunt    Cancer Paternal Uncle    Breast cancer Cousin    Colon cancer Neg Hx    Allergies[1] Current Outpatient Medications  Medication Sig Dispense Refill   oxyCODONE  (ROXICODONE ) 5 MG immediate release tablet Take 1 tablet (5 mg total) by mouth every 4 (four) hours as needed for severe pain (pain score 7-10) or breakthrough pain. 20 tablet 0   acetaminophen  (TYLENOL ) 325 MG tablet Take 2 tablets (650 mg total) by mouth every 6 (six) hours as needed for mild pain (pain score 1-3), fever or headache.     atenolol  (TENORMIN ) 50 MG tablet TAKE 1 TABLET(50 MG) BY MOUTH TWICE DAILY 180 tablet 3   B Complex-C (B-COMPLEX WITH VITAMIN C) tablet Take 1 tablet by mouth 2 (two) times a week.     calcium -vitamin D  (OSCAL WITH D) 500-5 MG-MCG tablet Take 1 tablet by mouth.     cholecalciferol  (VITAMIN D3) 25 MCG (1000 UNIT) tablet Take 2,000 Units by mouth daily.     famotidine  (PEPCID ) 40 MG tablet Take 40 mg by mouth at bedtime.     fluticasone  (FLONASE ) 50 MCG/ACT nasal spray Place 2 sprays into both nostrils daily. 16 g 6   folic acid  (FOLVITE) 1 MG tablet Take 1 mg by mouth daily.     gabapentin  (NEURONTIN ) 300 MG capsule Take 1 capsule (300 mg total) by mouth 2 (two) times daily. (Patient taking differently: Take 600 mg by mouth 3 (three) times daily.)     letrozole  (FEMARA ) 2.5 MG tablet TAKE 1 TABLET(2.5 MG) BY MOUTH DAILY 90 tablet 3   mirtazapine (REMERON) 7.5 MG tablet Take 15 mg by mouth daily.     Multiple Vitamins-Minerals (WOMENS MULTIVITAMIN PO) Take 1 tablet by mouth 3 (three) times a week.     oxyCODONE  (ROXICODONE ) 5 MG immediate release tablet Take 1 tablet (5 mg total) by mouth every 4 (four) hours as needed for severe pain (pain score 7-10) or breakthrough pain. 25 tablet 0   pantoprazole  (PROTONIX ) 40 MG tablet Take 40 mg by mouth 2 (two) times daily.     polyethylene glycol (MIRALAX  / GLYCOLAX ) 17 g packet Take 17 g by mouth daily as needed for moderate constipation.     sertraline  (ZOLOFT ) 50 MG tablet Take 25 mg by mouth at bedtime. (Patient taking differently: Take 100 mg by mouth at bedtime.)     Turmeric (QC TUMERIC COMPLEX PO) Take 1 tablet by mouth daily.     No current facility-administered medications for this visit.   No results found.  Review of Systems:   A ROS was performed including pertinent positives and negatives as documented in the HPI.   Musculoskeletal Exam:    There were no vitals taken for this visit.  Right shoulder incision is well-appearing without erythema or drainage.  In the sitting position she can forward elevate to approximately 90 degrees active assisted.  External rotation is to 20  degrees internal rotation deferred today  Imaging:    3 views right shoulder: Status post reverse arthroplasty without evidence of complication  I personally reviewed and interpreted the radiographs.   Assessment:   2 weeks status post right shoulder reverse arthroplasty without evidence of complication.  This time she will continue to progress through overhead range of motion and I  will plan to see her back in 4 weeks for reassessment.  We will plan to transition her to outpatient physical therapy  Plan :    - Return to clinic 4 weeks for reassessment      I personally saw and evaluated the patient, and participated in the management and treatment plan.  Elspeth Parker, MD Attending Physician, Orthopedic Surgery  This document was dictated using Dragon voice recognition software. A reasonable attempt at proof reading has been made to minimize errors.    [1]  Allergies Allergen Reactions   Pegfilgrastim  Hives    Blistering rash all over body   Codeine Itching and Nausea Only    Pt has had codeine with pre-administration to prevent reaction without any problem.  Other Reaction(s): nausea and vomiting, itching

## 2024-05-05 ENCOUNTER — Emergency Department (HOSPITAL_COMMUNITY)
Admission: EM | Admit: 2024-05-05 | Discharge: 2024-05-06 | Disposition: A | Discharge status: Home / Self Care | Source: Home / Self Care | Attending: Emergency Medicine | Admitting: Emergency Medicine

## 2024-05-05 ENCOUNTER — Encounter (HOSPITAL_COMMUNITY): Payer: Self-pay

## 2024-05-05 ENCOUNTER — Telehealth: Payer: Self-pay | Admitting: *Deleted

## 2024-05-05 ENCOUNTER — Other Ambulatory Visit: Payer: Self-pay

## 2024-05-05 ENCOUNTER — Emergency Department (HOSPITAL_COMMUNITY)

## 2024-05-05 DIAGNOSIS — R197 Diarrhea, unspecified: Secondary | ICD-10-CM | POA: Insufficient documentation

## 2024-05-05 DIAGNOSIS — Z853 Personal history of malignant neoplasm of breast: Secondary | ICD-10-CM | POA: Insufficient documentation

## 2024-05-05 DIAGNOSIS — R911 Solitary pulmonary nodule: Secondary | ICD-10-CM | POA: Diagnosis not present

## 2024-05-05 DIAGNOSIS — E876 Hypokalemia: Secondary | ICD-10-CM | POA: Diagnosis not present

## 2024-05-05 DIAGNOSIS — I7123 Aneurysm of the descending thoracic aorta, without rupture: Secondary | ICD-10-CM | POA: Insufficient documentation

## 2024-05-05 DIAGNOSIS — Z170 Estrogen receptor positive status [ER+]: Secondary | ICD-10-CM

## 2024-05-05 LAB — COMPREHENSIVE METABOLIC PANEL WITH GFR
ALT: 11 U/L (ref 0–44)
AST: 19 U/L (ref 15–41)
Albumin: 4.1 g/dL (ref 3.5–5.0)
Alkaline Phosphatase: 162 U/L — ABNORMAL HIGH (ref 38–126)
Anion gap: 16 — ABNORMAL HIGH (ref 5–15)
BUN: 9 mg/dL (ref 8–23)
CO2: 19 mmol/L — ABNORMAL LOW (ref 22–32)
Calcium: 9.2 mg/dL (ref 8.9–10.3)
Chloride: 110 mmol/L (ref 98–111)
Creatinine, Ser: 0.54 mg/dL (ref 0.44–1.00)
GFR, Estimated: >60 mL/min (ref >60)
Glucose, Bld: 87 mg/dL (ref 70–99)
Potassium: 3 mmol/L — ABNORMAL LOW (ref 3.5–5.1)
Sodium: 145 mmol/L (ref 135–145)
Total Bilirubin: 0.4 mg/dL (ref 0.0–1.2)
Total Protein: 7.6 g/dL (ref 6.5–8.1)

## 2024-05-05 LAB — URINALYSIS, ROUTINE W REFLEX MICROSCOPIC
Bilirubin Urine: NEGATIVE
Glucose, UA: NEGATIVE mg/dL
Hgb urine dipstick: NEGATIVE
Ketones, ur: 5 mg/dL — AB
Nitrite: NEGATIVE
Protein, ur: NEGATIVE mg/dL
Specific Gravity, Urine: 1.016 (ref 1.005–1.030)
pH: 6 (ref 5.0–8.0)

## 2024-05-05 LAB — CBC
HCT: 32.9 % — ABNORMAL LOW (ref 36.0–46.0)
Hemoglobin: 10.4 g/dL — ABNORMAL LOW (ref 12.0–15.0)
MCH: 25.7 pg — ABNORMAL LOW (ref 26.0–34.0)
MCHC: 31.6 g/dL (ref 30.0–36.0)
MCV: 81.2 fL (ref 80.0–100.0)
Platelets: 414 K/uL — ABNORMAL HIGH (ref 150–400)
RBC: 4.05 MIL/uL (ref 3.87–5.11)
RDW: 15.3 % (ref 11.5–15.5)
WBC: 9 K/uL (ref 4.0–10.5)
nRBC: 0 % (ref 0.0–0.2)

## 2024-05-05 LAB — LIPASE, BLOOD: Lipase: 68 U/L — ABNORMAL HIGH (ref 11–51)

## 2024-05-05 MED ORDER — SODIUM CHLORIDE 0.9 % IV BOLUS
1000.0000 mL | Freq: Once | INTRAVENOUS | Status: AC
  Administered 2024-05-05: 22:00:00 1000 mL via INTRAVENOUS

## 2024-05-05 MED ORDER — ONDANSETRON HCL 4 MG/2ML IJ SOLN
4.0000 mg | Freq: Once | INTRAMUSCULAR | Status: AC
  Administered 2024-05-05: 22:00:00 4 mg via INTRAVENOUS
  Filled 2024-05-05: qty 2

## 2024-05-05 MED ORDER — IOHEXOL 300 MG/ML  SOLN
100.0000 mL | Freq: Once | INTRAMUSCULAR | Status: AC | PRN
  Administered 2024-05-05: 23:00:00 100 mL via INTRAVENOUS

## 2024-05-05 MED ORDER — POTASSIUM CHLORIDE 10 MEQ/100ML IV SOLN
10.0000 meq | Freq: Once | INTRAVENOUS | Status: AC
  Administered 2024-05-05: 22:00:00 10 meq via INTRAVENOUS
  Filled 2024-05-05: qty 100

## 2024-05-05 NOTE — ED Triage Notes (Signed)
 Pt BIBA from home c/o diarrhea and dehydration. Pt reports 6 occurrences of diarrhea today with dark black appearance. Pt also reports lack of appetite. Currently on Cancer treatment. No SOB, NAD.

## 2024-05-05 NOTE — ED Provider Notes (Signed)
 [name] AT [facility] Provider Note   CSN: [id number] Arrival date & time: 05/05/24  1644     Patient presents with: Diarrhea and Dehydration   [name] is a 68 y.o. female history of breast cancer on chemo, here presenting with diarrhea and abdominal pain and weight loss.  Patient states that she has chronic diarrhea.  However today she had 6 episodes diarrhea.  She also noticed that she lost about 40 pounds over the last several months.  Patient also had some abdominal pain today.  Patient is still on oral chemo for her breast cancer.   The history is provided by the patient.       Prior to Admission medications  Medication Sig Start Date End Date Taking? Authorizing Provider  oxyCODONE  (ROXICODONE ) 5 MG immediate release tablet Take 1 tablet (5 mg total) by mouth every 4 (four) hours as needed for severe pain (pain score 7-10) or breakthrough pain. 04/26/24   Genelle Standing, MD  acetaminophen  (TYLENOL ) 325 MG tablet Take 2 tablets (650 mg total) by mouth every 6 (six) hours as needed for mild pain (pain score 1-3), fever or headache. 05/24/23   Rai, Ripudeep K, MD  atenolol  (TENORMIN ) 50 MG tablet TAKE 1 TABLET(50 MG) BY MOUTH TWICE DAILY 12/18/23   West, Katlyn D, NP  B Complex-C (B-COMPLEX WITH VITAMIN C) tablet Take 1 tablet by mouth 2 (two) times a week.    [provider]  calcium -vitamin D  (OSCAL WITH D) 500-5 MG-MCG tablet Take 1 tablet by mouth.    [provider]  cholecalciferol  (VITAMIN D3) 25 MCG (1000 UNIT) tablet Take 2,000 Units by mouth daily.    [provider]  famotidine  (PEPCID ) 40 MG tablet Take 40 mg by mouth at bedtime. 01/22/23   [provider]  fluticasone  (FLONASE ) 50 MCG/ACT nasal spray Place 2 sprays into both nostrils daily. 01/30/23   Soldatova, Liuba, MD  folic acid (FOLVITE) 1 MG tablet Take 1 mg by mouth daily. 02/23/24   [provider]  gabapentin  (NEURONTIN ) 300 MG  capsule Take 1 capsule (300 mg total) by mouth 2 (two) times daily. Patient taking differently: Take 600 mg by mouth 3 (three) times daily. 05/24/23   Rai, Ripudeep K, MD  letrozole  (FEMARA ) 2.5 MG tablet TAKE 1 TABLET(2.5 MG) BY MOUTH DAILY 11/14/23   Gudena, Vinay, MD  mirtazapine (REMERON) 7.5 MG tablet Take 15 mg by mouth daily. 02/19/24   [provider]  Multiple Vitamins-Minerals (WOMENS MULTIVITAMIN PO) Take 1 tablet by mouth 3 (three) times a week.    [provider]  oxyCODONE  (ROXICODONE ) 5 MG immediate release tablet Take 1 tablet (5 mg total) by mouth every 4 (four) hours as needed for severe pain (pain score 7-10) or breakthrough pain. 04/19/24   Genelle Standing, MD  pantoprazole  (PROTONIX ) 40 MG tablet Take 40 mg by mouth 2 (two) times daily.    [provider]  polyethylene glycol (MIRALAX  / GLYCOLAX ) 17 g packet Take 17 g by mouth daily as needed for moderate constipation. 05/24/23   Rai, Nydia POUR, MD  sertraline  (ZOLOFT ) 50 MG tablet Take 25 mg by mouth at bedtime. Patient taking differently: Take 100 mg by mouth at bedtime. 03/11/23   [provider]  Turmeric (QC TUMERIC COMPLEX PO) Take 1 tablet by mouth daily.    [provider]    Allergies: Pegfilgrastim  and Codeine    Review of Systems  Gastrointestinal:  Positive for  abdominal pain and diarrhea.  All other systems reviewed and are negative.   Updated Vital Signs BP 133/86 (BP Location: Left Arm)   Pulse 71   Temp (!) 97.4 F (36.3 C) (Oral)   Resp 18   Ht 5' 4 (1.626 m)   Wt 65.4 kg   SpO2 99%   BMI 24.75 kg/m   Physical Exam Vitals and nursing note reviewed.  HENT:     Head: Normocephalic.     Nose: Nose normal.     Mouth/Throat:     Mouth: Mucous membranes are moist.  Eyes:     Pupils: Pupils are equal, round, and reactive to light.  Cardiovascular:     Rate and Rhythm: Normal rate and regular rhythm.  Pulmonary:     Effort: Pulmonary effort is normal.      Breath sounds: Normal breath sounds.  Abdominal:     General: Abdomen is flat.     Comments: Mild epigastric tenderness.  Musculoskeletal:        General: Normal range of motion.     Cervical back: Normal range of motion and neck supple.  Skin:    General: Skin is warm.     Capillary Refill: Capillary refill takes less than 2 seconds.  Neurological:     General: No focal deficit present.     Mental Status: She is alert.  Psychiatric:        Mood and Affect: Mood normal.     (all labs ordered are listed, but only abnormal results are displayed) Labs Reviewed  LIPASE, BLOOD - Abnormal; Notable for the following components:      Result Value   Lipase 68 (*)    All other components within normal limits  COMPREHENSIVE METABOLIC PANEL WITH GFR - Abnormal; Notable for the following components:   Potassium 3.0 (*)    CO2 19 (*)    Alkaline Phosphatase 162 (*)    Anion gap 16 (*)    All other components within normal limits  CBC - Abnormal; Notable for the following components:   Hemoglobin 10.4 (*)    HCT 32.9 (*)    MCH 25.7 (*)    Platelets 414 (*)    All other components within normal limits  URINALYSIS, ROUTINE W REFLEX MICROSCOPIC - Abnormal; Notable for the following components:   Ketones, ur 5 (*)    Leukocytes,Ua MODERATE (*)    Bacteria, UA RARE (*)    All other components within normal limits  C DIFFICILE QUICK SCREEN W PCR REFLEX    GASTROINTESTINAL PANEL BY PCR, STOOL (REPLACES STOOL CULTURE)    EKG: None  Radiology: No results found.   Procedures   Medications Ordered in the ED  sodium chloride  0.9 % bolus 1,000 mL (has no administration in time range)  potassium chloride  10 mEq in 100 mL IVPB (has no administration in time range)  ondansetron  (ZOFRAN ) injection 4 mg (has no administration in time range)                                    Medical Decision Making [name] is a 68 y.o. female here presenting with abdominal pain and diarrhea.   Patient appears dehydrated.  Consider viral gastroenteritis versus colitis versus recurrent cancer.  Plan to get labs and CT chest abdomen pelvis.  Will hydrate patient and reassess  11:20 PM I reviewed patient's labs and lipase is  slightly elevated at 68.  Anion gap is 16.  CT chest abdomen pelvis showed no recurrent cancer but patient does have a focal aneurysm.  Patient can follow-up with her doctor outpatient.  Patient received IV fluids and Zofran  and potassium and is feeling better.  Problems Addressed: Aneurysm of descending thoracic aorta without rupture: undiagnosed new problem with uncertain prognosis Diarrhea, unspecified type: acute illness or injury Lung nodule: undiagnosed new problem with uncertain prognosis  Amount and/or Complexity of Data Reviewed Labs: ordered. Decision-making details documented in ED Course. Radiology: ordered and independent interpretation performed. Decision-making details documented in ED Course.  Risk Prescription drug management.     Final diagnoses:  None    ED Discharge Orders     None          Patt Alm Macho, MD 05/05/24 2322

## 2024-05-05 NOTE — Discharge Instructions (Signed)
 As we discussed your CT scan did not show any bowel inflammation.  There are some incidental findings such as aneurysm of your aorta and lung nodules.  You can follow-up with your doctor regarding this  I recommend you take Imodium  up to 10 times a day for diarrhea  You should follow-up with your GI doctor and your primary care doctor  Please stay hydrated  Return to ER if you have worse diarrhea abdominal pain or vomiting or dehydration

## 2024-05-05 NOTE — Telephone Encounter (Signed)
 Received call from pt with complaint of weight loss of 40 lbs in 6 months, decreased appetite, nausea and extreme hot flashes x several months. RN reviewed with MD who has recommended pt be seen in [facility].  Appt scheduled, pt notified and verbalized understanding.

## 2024-05-05 NOTE — Telephone Encounter (Signed)
 Received call from pt requesting to cancel upcoming [facility] visit.  Pt states she has had nonstop diarrhea today with nothing to eat and now the diarrhea is black in color.  Pt states she feels extremely dehydrated and will be going to the ED for further workup.  MD notified and verbalized understanding.

## 2024-05-06 MED ORDER — HEPARIN SOD (PORK) LOCK FLUSH 100 UNIT/ML IV SOLN
500.0000 [IU] | Freq: Once | INTRAVENOUS | Status: AC
  Administered 2024-05-06: 03:00:00 500 [IU]
  Filled 2024-05-06: qty 5

## 2024-05-06 NOTE — ED Notes (Signed)
 Port flushed with 10 mL NS and Heparin  flush 5 mL prior to d/c

## 2024-05-06 NOTE — ED Notes (Signed)
 [facility] called and made aware of need for transportation

## 2024-05-07 ENCOUNTER — Inpatient Hospital Stay

## 2024-05-07 ENCOUNTER — Inpatient Hospital Stay: Admitting: Adult Health

## 2024-05-17 ENCOUNTER — Encounter (HOSPITAL_BASED_OUTPATIENT_CLINIC_OR_DEPARTMENT_OTHER): Payer: Self-pay | Admitting: Orthopaedic Surgery

## 2024-05-19 NOTE — Telephone Encounter (Signed)
 Email sent to Beverley Ann with [facility] advising patient is transitioning to outpatient PT.

## 2024-05-21 ENCOUNTER — Telehealth: Payer: Self-pay | Admitting: Radiology

## 2024-05-21 NOTE — Telephone Encounter (Signed)
 RICK  I sent message to Beverley Ann with [facility] to advise patient was transitioning to outpatient PT.   He sent me this response just so that you are aware. He also said that he appreciates you allowing them to take care of her.    I reviewed her chart real quick as I do with most active patients. I wanted to give some information that you may want to share. Patient refused several times with our therapists. Many documented instances of being sick, no answers our contact attempts, and simple refusal of the visit. Her progress in her recovery may not be where the providers were expecting so I thought I should just share that with you for some context.

## 2024-05-26 ENCOUNTER — Ambulatory Visit (HOSPITAL_BASED_OUTPATIENT_CLINIC_OR_DEPARTMENT_OTHER): Attending: Orthopaedic Surgery | Admitting: Physical Therapy

## 2024-05-26 ENCOUNTER — Encounter (HOSPITAL_BASED_OUTPATIENT_CLINIC_OR_DEPARTMENT_OTHER): Payer: Self-pay | Admitting: Physical Therapy

## 2024-05-26 ENCOUNTER — Other Ambulatory Visit: Payer: Self-pay

## 2024-05-26 DIAGNOSIS — G8929 Other chronic pain: Secondary | ICD-10-CM | POA: Diagnosis present

## 2024-05-26 DIAGNOSIS — R293 Abnormal posture: Secondary | ICD-10-CM | POA: Insufficient documentation

## 2024-05-26 DIAGNOSIS — M25611 Stiffness of right shoulder, not elsewhere classified: Secondary | ICD-10-CM | POA: Insufficient documentation

## 2024-05-26 DIAGNOSIS — M25511 Pain in right shoulder: Secondary | ICD-10-CM | POA: Diagnosis not present

## 2024-05-26 NOTE — Therapy (Signed)
 " OUTPATIENT PHYSICAL THERAPY UPPER EXTREMITY EVALUATION   Patient Name: [name] MRN: [id number] DOB:May 03, 1956, 69 y.o., female Today's Date: 05/27/2024  END OF SESSION:  PT End of Session - 05/27/24 1030     Visit Number 1    Number of Visits 16    Date for Recertification  07/22/24    PT Start Time 0930    PT Stop Time 1012    PT Time Calculation (min) 42 min    Activity Tolerance Patient tolerated treatment well    Behavior During Therapy [facility] for tasks assessed/performed          Past Medical History:  Diagnosis Date   Allergy    Phreesia 03/21/2020   Anemia    Phreesia 03/21/2020   Anxiety    Arthritis    Phreesia 03/21/2020   Blood transfusion without reported diagnosis    Phreesia 03/21/2020   Cancer (HCC) 2016   right breast cancer-mastectomy   COPD (chronic obstructive pulmonary disease) (HCC)    Depression    Depression    Phreesia 03/21/2020   Dysrhythmia    SVT   Fibromyalgia    GERD (gastroesophageal reflux disease)    Phreesia 03/21/2020   Heavy smoker    Hepatitis C    Pt was given too much interferon - critical care x2d, 5 days total hosp   Hypertension    Neuromuscular disorder (HCC)    Phreesia 03/21/2020   Osteoporosis    Perimenopausal 11/18/2003   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), hypoglycemia 2/04-resolved with PO calcium  + D, SVT, was treated with iterferon and ribavirin-10/3-2/04   Personal history of chemotherapy    PUD (peptic ulcer disease)    Rheumatoid arthritis(714.0)    possible   SVT (supraventricular tachycardia)    Wears glasses    Past Surgical History:  Procedure Laterality Date   ABDOMINAL HYSTERECTOMY     BREAST SURGERY N/A    Phreesia 03/21/2020   CESAREAN SECTION  09/17/1980   CESAREAN SECTION N/A    Phreesia 03/21/2020   COLONOSCOPY     DILATION AND CURETTAGE OF UTERUS     hysterectomy-for dysmenorrhea  09/17/2000   INTRAMEDULLARY (IM)  NAIL INTERTROCHANTERIC Left 05/21/2023   Procedure: INTRAMEDULLARY (IM) NAIL INTERTROCHANTERIC WITH HARDWARE REMOVAL;  Surgeon: Genelle Standing, MD;  Location: [facility];  Service: Orthopedics;  Laterality: Left;   JOINT REPLACEMENT N/A    Phreesia 03/21/2020   MASTECTOMY Right 2016   MASTECTOMY W/ SENTINEL NODE BIOPSY Right 07/28/2014   Procedure: RIGHT MASTECTOMY WITH SENTINEL LYMPH NODE BIOPSY ;  Surgeon: Jina Nephew, MD;  Location: [facility];  Service: General;  Laterality: Right;   NASAL SINUS SURGERY  03/20/1984   for deviated septum   PORTACATH PLACEMENT Left 01/20/2014   Procedure: INSERTION PORT-A-CATH;  Surgeon: Jina Nephew, MD;  Location: [facility];  Service: General;  Laterality: Left;   REVERSE SHOULDER ARTHROPLASTY Right 04/19/2024   Procedure: ARTHROPLASTY, SHOULDER, TOTAL, REVERSE;  Surgeon: Genelle Standing, MD;  Location: [facility];  Service: Orthopedics;  Laterality: Right;   TONSILLECTOMY  05/20/1961   UPPER GI ENDOSCOPY     Patient Active Problem List   Diagnosis Date Noted   Rotator cuff arthropathy of right shoulder 04/19/2024   Age-related osteoporosis without current pathological fracture 07/08/2023   Accidental fall 05/21/2023   S/p left hip fracture 05/20/2023   Lipoid pneumonia (HCC) 12/06/2022   COPD (chronic obstructive pulmonary disease) (  HCC) 12/06/2022   Spondylosis of lumbar region without myelopathy or radiculopathy 09/26/2016   Cervical pain 08/21/2015   Degeneration of thoracic intervertebral disc 08/21/2015   Adhesive capsulitis of shoulder 08/21/2015   Chronic pain syndrome 08/21/2015   Osteoarthritis 03/22/2015   Cough    Hypokalemia 08/15/2014   Nausea with vomiting 08/15/2014   Diarrhea 08/15/2014   Flu-like symptoms 08/15/2014   Breast cancer, female (HCC) 07/28/2014   Chemotherapy induced nausea and vomiting 03/28/2014   Palmar plantar erythrodysaesthesia 03/18/2014   Rash 03/04/2014    Bronchitis 03/04/2014   Anorexia 03/04/2014   Altered taste 03/04/2014   Weight loss 03/04/2014   Insomnia 03/04/2014   Transaminitis 03/04/2014   Extremity edema 02/25/2014   Dehydration 02/25/2014   Thrush of mouth and esophagus (HCC) 02/02/2014   Diarrhea due to drug 02/02/2014   Breast cancer of upper-outer quadrant of right female breast (HCC) 12/20/2013   Rheumatoid arthritis (HCC) 04/03/2013   TOBACCO ABUSE 06/16/2009   PSVT 06/16/2009   FIBROMYALGIA 06/16/2009   HEPATITIS B 07/17/2006   HEPATITIS C 07/17/2006   GASTROESOPHAGEAL REFLUX, NO ESOPHAGITIS 07/17/2006    PCP: Joesph Cedar PA-C  REFERRING PROVIDER: Elspeth Parker MD   REFERRING DIAG:  Diagnosis  M25.511,G89.29 (ICD-10-CM) - Chronic right shoulder pain    THERAPY DIAG:  Acute pain of right shoulder  Abnormal posture  Stiffness of right shoulder, not elsewhere classified  Rationale for Evaluation and Treatment: Rehabilitation  ONSET DATE:   SUBJECTIVE:                                                                                                                                                                                      SUBJECTIVE STATEMENT: Patient had a reverse total shoulder arthroplasty on 04/19/2024.  She had 2 weeks of home health.  She reports she did very little with her shoulder in those 2 weeks.  She is now coming to outpatient physical therapy.  She reports she is out of her sling.  She is trying to use her arm as frequently as possible for her daily tasks.  She feels like it is coming along well.  She is mild pain at this time.  She has more pain when she tries to use it often.  She is using a walker for primary mobility.  She reports minimal pain when she is putting pressure through the walker.  She has an extensive past medical history of hip and back issues. Hand dominance: Right  PERTINENT HISTORY: COPD, RA , Low back pain, Fibromyalgia, Left shoulder OA, Osteopetrosis, Breat  Cancer, cervical pain , left hip pain   PAIN:  Are you having pain?  Yes: NPRS scale: 3/10 right now. Can be up to an 8-9  Pain location: right shoulder  Pain description: aching  Aggravating factors: use of the arm  Relieving factors: rest  PRECAUTIONS: None  RED FLAGS: None   WEIGHT BEARING RESTRICTIONS: No  FALLS:  Has patient fallen in last 6 months? No  LIVING ENVIRONMENT: Nothing pertinent  OCCUPATION: Retired  Hobbies:  Takes care of her cats  Reading   PLOF: Independent  PATIENT GOALS:  To be able to use her arm   NEXT MD VISIT:    OBJECTIVE:  Note: Objective measures were completed at Evaluation unless otherwise noted.  DIAGNOSTIC FINDINGS:   Nothing post op  PATIENT SURVEYS :  Quick Dash:  QUICK DASH  Please rate your ability do the following activities in the last week by selecting the number below the appropriate response.   Activities Rating  Open a tight or new jar.    Do heavy household chores (e.g., wash walls, floors).   Carry a shopping bag or briefcase   Wash your back.   Use a knife to cut food.   Recreational activities in which you take some force or impact through your arm, shoulder or hand (e.g., golf, hammering, tennis, etc.).   During the past week, to what extent has your arm, shoulder or hand problem interfered with your normal social activities with family, friends, neighbors or groups?    During the past week, were you limited in your work or other regular daily activities as a result of your arm, shoulder or hand problem?   Rate the severity of the following symptoms in the last week: Arm, Shoulder, or hand pain.   Rate the severity of the following symptoms in the last week: Tingling (pins and needles) in your arm, shoulder or hand.   During the past week, how much difficulty have you had sleeping because of the pain in your arm, shoulder or hand?  32/55   (A QuickDASH score may not be calculated if there is greater than 1  missing item.)  Quick Dash Disability/Symptom Score: [(sum of 32 (n) responses/55 (n)] x 25 =   Minimally Clinically Important Difference (MCID): 15-20 points  (Franchignoni, F. et al. (2013). Minimally clinically important difference of the disabilities of the arm, shoulder, and hand outcome measures (DASH) and its shortened version (Quick DASH). Journal of Orthopaedic & Sports Physical Therapy, 44(1), 30-39)   COGNITION: Overall cognitive status: Within functional limits for tasks assessed     SENSATION: WFL  POSTURE:   UPPER EXTREMITY ROM:   Passive ROM Right eval Left eval  Shoulder flexion 86 with tightness   Shoulder extension    Shoulder abduction    Shoulder adduction    Shoulder internal rotation    Shoulder external rotation 40 with pain at end range  More range with distraction   Elbow flexion    Elbow extension Slightly bent in resting position ~10 degrees  Can straighten arm out passively   Wrist flexion    Wrist extension    Wrist ulnar deviation    Wrist radial deviation    Wrist pronation    Wrist supination    (Blank rows = not tested)  UPPER EXTREMITY MMT: MMT deferred due to time and post-op   MMT Right eval Left eval  Shoulder flexion    Shoulder extension    Shoulder abduction    Shoulder adduction    Shoulder internal rotation    Shoulder external rotation  Middle trapezius    Lower trapezius    Elbow flexion    Elbow extension    Wrist flexion    Wrist extension    Wrist ulnar deviation    Wrist radial deviation    Wrist pronation    Wrist supination    Grip strength (lbs)    (Blank rows = not tested) Not tested 2nd to recent surgery     PALPATION:  No unexpected TTP  Moderate spasming of the upper trap                                                                                                                             TREATMENT DATE:  Manual:  PROM into flexion and ER with distraction  Grade 1 and II inferior  and posterior glide  Manual trigger point release to upper trap and anterior shoulder.  Reviewed trigger point release to upper trap with a thera-cane   There-ex:  Wand press x10  PATIENT EDUCATION: Education details: HEP, symptom management, progression of activity at home. Person educated: Patient Education method: Explanation, Demonstration, Tactile cues, Verbal cues, and Handouts Education comprehension: verbalized understanding, returned demonstration, verbal cues required, tactile cues required, and needs further education  HOME EXERCISE PROGRAM: Access Code: [email or web address] URL: [email or web address] Date: 05/27/2024 Prepared by: Alm Don  Exercises - Supine Shoulder Press with Dowel  - 1 x daily - 7 x weekly - 3 sets - 10 reps - Theracane Over Shoulder  - 1 x daily - 7 x weekly - 3 sets - 1-2 min  hold  ASSESSMENT:  CLINICAL IMPRESSION: Patient is a 69 year old female status post l right reverse total shoulder arthroplasty without subscap repair.  She presents with expected limitations in motion, function, and strength.  She would benefit from skilled therapy to improve ability to use right upper extremity.  OBJECTIVE IMPAIRMENTS: decreased activity tolerance, decreased endurance, difficulty walking, decreased ROM, decreased strength, increased fascial restrictions, increased muscle spasms, impaired UE functional use, and pain.   ACTIVITY LIMITATIONS: carrying, lifting, sitting, transfers, dressing, and reach over head  PARTICIPATION LIMITATIONS: cleaning and shopping  PERSONAL FACTORS: Age and Time since onset of injury/illness/exacerbation are also affecting patient's functional outcome.   REHAB POTENTIAL: Good  CLINICAL DECISION MAKING: Evolving/moderate complexity  EVALUATION COMPLEXITY: Moderate  GOALS: Goals reviewed with patient? Yes  SHORT TERM GOALS: Target date: 06/24/2024    Patient will increase active right shoulder flexion to 110  degrees  Baseline: Goal status: INITIAL  2.  Patient will increase passive right ER to 50 degrees  Baseline:  Goal status: INITIAL  3.  Patient will be independent with base HEP Baseline:  Goal status: INITIAL   LONG TERM GOALS: Target date: 07/22/2024    Patient will rch behind her head without pain  Baseline:  Goal status: INITIAL  2.  Patient will reach behind her back to L1 without pain  Baseline:  Goal status: INITIAL  3.  Patient will reach  over the head with a 2lb weight to a shelf without pain  Baseline: Pt. Will increase strength in shoulder press by 1 lb and will increase endurance.  Goal status: INITIAL   PLAN: PT FREQUENCY: 2x/week  PT DURATION: 8 weeks  PLANNED INTERVENTIONS: 97110-Therapeutic exercises, 97530- Therapeutic activity, 97112- Neuromuscular re-education, 97535- Self Care, [id number]- Manual therapy, [phone]- Aquatic Therapy, [phone]- Ultrasound, [phone] (1-2 muscles), [phone] (3+ muscles)- Dry Needling, Patient/Family education, Joint mobilization, DME instructions, and Cryotherapy  PLAN FOR NEXT SESSION:  Begin PROM into available ranges flexion most limited  Begin AAROM next visit  Consider table slide wand flexion  Self flexion  Active ER  Scap retraction to neutral Patient 5 weeks post op to start     Alm JINNY Don, PT 05/27/2024, 10:32 AM  "

## 2024-05-27 ENCOUNTER — Encounter (HOSPITAL_BASED_OUTPATIENT_CLINIC_OR_DEPARTMENT_OTHER): Payer: Self-pay | Admitting: Physical Therapy

## 2024-05-29 ENCOUNTER — Ambulatory Visit (HOSPITAL_BASED_OUTPATIENT_CLINIC_OR_DEPARTMENT_OTHER): Admitting: Physical Therapy

## 2024-06-02 ENCOUNTER — Encounter: Payer: Self-pay | Admitting: Hematology and Oncology

## 2024-06-02 ENCOUNTER — Encounter (HOSPITAL_BASED_OUTPATIENT_CLINIC_OR_DEPARTMENT_OTHER): Admitting: Physical Therapy

## 2024-06-07 ENCOUNTER — Telehealth (HOSPITAL_BASED_OUTPATIENT_CLINIC_OR_DEPARTMENT_OTHER): Payer: Self-pay | Admitting: Physical Therapy

## 2024-06-07 NOTE — Telephone Encounter (Signed)
 Called and spoke with pt- needed to adjust schedule for 2/17, offered time on 2/20 but pt reports issues with transportation and requested to cancel visit on 2/17. We did discuss options like [employer] to help her get to PT, can certainly follow up with this more at next appt in clinic.   Josette Rough, PT, DPT 06/07/24 8:29 AM

## 2024-06-09 ENCOUNTER — Ambulatory Visit (HOSPITAL_BASED_OUTPATIENT_CLINIC_OR_DEPARTMENT_OTHER)

## 2024-06-09 ENCOUNTER — Encounter (HOSPITAL_BASED_OUTPATIENT_CLINIC_OR_DEPARTMENT_OTHER): Payer: Self-pay

## 2024-06-09 DIAGNOSIS — R293 Abnormal posture: Secondary | ICD-10-CM

## 2024-06-09 DIAGNOSIS — M25611 Stiffness of right shoulder, not elsewhere classified: Secondary | ICD-10-CM

## 2024-06-09 DIAGNOSIS — M25511 Pain in right shoulder: Secondary | ICD-10-CM

## 2024-06-09 DIAGNOSIS — G8929 Other chronic pain: Secondary | ICD-10-CM | POA: Diagnosis not present

## 2024-06-09 NOTE — Therapy (Signed)
 " OUTPATIENT PHYSICAL THERAPY UPPER EXTREMITY TREATMENT   Patient Name: [name] MRN: [id number] DOB:Sep 21, 1955, 69 y.o., female Today's Date: 06/09/2024  END OF SESSION:  PT End of Session - 06/09/24 1029     Visit Number 2    Number of Visits 16    Date for Recertification  07/22/24    Authorization Time Period 05/26/2024-07/17/2024    Authorization - Visit Number 2    Authorization - Number of Visits 12    PT Start Time 0933    PT Stop Time 1013    PT Time Calculation (min) 40 min    Activity Tolerance Patient tolerated treatment well    Behavior During Therapy [facility] for tasks assessed/performed           Past Medical History:  Diagnosis Date   Allergy    Phreesia 03/21/2020   Anemia    Phreesia 03/21/2020   Anxiety    Arthritis    Phreesia 03/21/2020   Blood transfusion without reported diagnosis    Phreesia 03/21/2020   Cancer (HCC) 2016   right breast cancer-mastectomy   COPD (chronic obstructive pulmonary disease) (HCC)    Depression    Depression    Phreesia 03/21/2020   Dysrhythmia    SVT   Fibromyalgia    GERD (gastroesophageal reflux disease)    Phreesia 03/21/2020   Heavy smoker    Hepatitis C    Pt was given too much interferon - critical care x2d, 5 days total hosp   Hypertension    Neuromuscular disorder (HCC)    Phreesia 03/21/2020   Osteoporosis    Perimenopausal 11/18/2003   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), hypoglycemia 2/04-resolved with PO calcium  + D, SVT, was treated with iterferon and ribavirin-10/3-2/04   Personal history of chemotherapy    PUD (peptic ulcer disease)    Rheumatoid arthritis(714.0)    possible   SVT (supraventricular tachycardia)    Wears glasses    Past Surgical History:  Procedure Laterality Date   ABDOMINAL HYSTERECTOMY     BREAST SURGERY N/A    Phreesia 03/21/2020   CESAREAN SECTION  09/17/1980   CESAREAN SECTION N/A    Phreesia  03/21/2020   COLONOSCOPY     DILATION AND CURETTAGE OF UTERUS     hysterectomy-for dysmenorrhea  09/17/2000   INTRAMEDULLARY (IM) NAIL INTERTROCHANTERIC Left 05/21/2023   Procedure: INTRAMEDULLARY (IM) NAIL INTERTROCHANTERIC WITH HARDWARE REMOVAL;  Surgeon: Genelle Standing, MD;  Location: [facility];  Service: Orthopedics;  Laterality: Left;   JOINT REPLACEMENT N/A    Phreesia 03/21/2020   MASTECTOMY Right 2016   MASTECTOMY W/ SENTINEL NODE BIOPSY Right 07/28/2014   Procedure: RIGHT MASTECTOMY WITH SENTINEL LYMPH NODE BIOPSY ;  Surgeon: Jina Nephew, MD;  Location: [facility];  Service: General;  Laterality: Right;   NASAL SINUS SURGERY  03/20/1984   for deviated septum   PORTACATH PLACEMENT Left 01/20/2014   Procedure: INSERTION PORT-A-CATH;  Surgeon: Jina Nephew, MD;  Location: [facility];  Service: General;  Laterality: Left;   REVERSE SHOULDER ARTHROPLASTY Right 04/19/2024   Procedure: ARTHROPLASTY, SHOULDER, TOTAL, REVERSE;  Surgeon: Genelle Standing, MD;  Location: [facility];  Service: Orthopedics;  Laterality: Right;   TONSILLECTOMY  05/20/1961   UPPER GI ENDOSCOPY     Patient Active Problem List   Diagnosis Date Noted   Rotator cuff arthropathy of right shoulder 04/19/2024   Age-related osteoporosis without current pathological fracture 07/08/2023  Accidental fall 05/21/2023   S/p left hip fracture 05/20/2023   Lipoid pneumonia (HCC) 12/06/2022   COPD (chronic obstructive pulmonary disease) (HCC) 12/06/2022   Spondylosis of lumbar region without myelopathy or radiculopathy 09/26/2016   Cervical pain 08/21/2015   Degeneration of thoracic intervertebral disc 08/21/2015   Adhesive capsulitis of shoulder 08/21/2015   Chronic pain syndrome 08/21/2015   Osteoarthritis 03/22/2015   Cough    Hypokalemia 08/15/2014   Nausea with vomiting 08/15/2014   Diarrhea 08/15/2014   Flu-like symptoms 08/15/2014   Breast cancer, female (HCC)  07/28/2014   Chemotherapy induced nausea and vomiting 03/28/2014   Palmar plantar erythrodysaesthesia 03/18/2014   Rash 03/04/2014   Bronchitis 03/04/2014   Anorexia 03/04/2014   Altered taste 03/04/2014   Weight loss 03/04/2014   Insomnia 03/04/2014   Transaminitis 03/04/2014   Extremity edema 02/25/2014   Dehydration 02/25/2014   Thrush of mouth and esophagus (HCC) 02/02/2014   Diarrhea due to drug 02/02/2014   Breast cancer of upper-outer quadrant of right female breast (HCC) 12/20/2013   Rheumatoid arthritis (HCC) 04/03/2013   TOBACCO ABUSE 06/16/2009   PSVT 06/16/2009   FIBROMYALGIA 06/16/2009   HEPATITIS B 07/17/2006   HEPATITIS C 07/17/2006   GASTROESOPHAGEAL REFLUX, NO ESOPHAGITIS 07/17/2006    PCP: Joesph Cedar PA-C  REFERRING PROVIDER: Elspeth Parker MD   REFERRING DIAG:  Diagnosis  M25.511,G89.29 (ICD-10-CM) - Chronic right shoulder pain    THERAPY DIAG:  Acute pain of right shoulder  Stiffness of right shoulder, not elsewhere classified  Abnormal posture  Rationale for Evaluation and Treatment: Rehabilitation  ONSET DATE:   SUBJECTIVE:                                                                                                                                                                                      SUBJECTIVE STATEMENT:  Pt reports she accidentally used her R arm to open the fridge which really bothered it. I remembered to use my L arm today.  Eval: Patient had a reverse total shoulder arthroplasty on 04/19/2024.  She had 2 weeks of home health.  She reports she did very little with her shoulder in those 2 weeks.  She is now coming to outpatient physical therapy.  She reports she is out of her sling.  She is trying to use her arm as frequently as possible for her daily tasks.  She feels like it is coming along well.  She is mild pain at this time.  She has more pain when she tries to use it often.  She is using a walker for primary  mobility.  She reports minimal pain when she is putting  pressure through the walker.  She has an extensive past medical history of hip and back issues. Hand dominance: Right  PERTINENT HISTORY: COPD, RA , Low back pain, Fibromyalgia, Left shoulder OA, Osteopetrosis, Breat Cancer, cervical pain , left hip pain   PAIN:  Are you having pain? Yes: NPRS scale: 3/10 right now. Can be up to an 8-9  Pain location: right shoulder  Pain description: aching  Aggravating factors: use of the arm  Relieving factors: rest  PRECAUTIONS: None  RED FLAGS: None   WEIGHT BEARING RESTRICTIONS: No  FALLS:  Has patient fallen in last 6 months? No  LIVING ENVIRONMENT: Nothing pertinent  OCCUPATION: Retired  Hobbies:  Takes care of her cats  Reading   PLOF: Independent  PATIENT GOALS:  To be able to use her arm   NEXT MD VISIT:    OBJECTIVE:  Note: Objective measures were completed at Evaluation unless otherwise noted.  DIAGNOSTIC FINDINGS:   Nothing post op  PATIENT SURVEYS :  Quick Dash:  QUICK DASH  Please rate your ability do the following activities in the last week by selecting the number below the appropriate response.   Activities Rating  Open a tight or new jar.    Do heavy household chores (e.g., wash walls, floors).   Carry a shopping bag or briefcase   Wash your back.   Use a knife to cut food.   Recreational activities in which you take some force or impact through your arm, shoulder or hand (e.g., golf, hammering, tennis, etc.).   During the past week, to what extent has your arm, shoulder or hand problem interfered with your normal social activities with family, friends, neighbors or groups?    During the past week, were you limited in your work or other regular daily activities as a result of your arm, shoulder or hand problem?   Rate the severity of the following symptoms in the last week: Arm, Shoulder, or hand pain.   Rate the severity of the following  symptoms in the last week: Tingling (pins and needles) in your arm, shoulder or hand.   During the past week, how much difficulty have you had sleeping because of the pain in your arm, shoulder or hand?  32/55   (A QuickDASH score may not be calculated if there is greater than 1 missing item.)  Quick Dash Disability/Symptom Score: [(sum of 32 (n) responses/55 (n)] x 25 =   Minimally Clinically Important Difference (MCID): 15-20 points  (Franchignoni, F. et al. (2013). Minimally clinically important difference of the disabilities of the arm, shoulder, and hand outcome measures (DASH) and its shortened version (Quick DASH). Journal of Orthopaedic & Sports Physical Therapy, 44(1), 30-39)   COGNITION: Overall cognitive status: Within functional limits for tasks assessed     SENSATION: WFL  POSTURE:   UPPER EXTREMITY ROM:   Passive ROM Right eval Left eval  Shoulder flexion 86 with tightness   Shoulder extension    Shoulder abduction    Shoulder adduction    Shoulder internal rotation    Shoulder external rotation 40 with pain at end range  More range with distraction   Elbow flexion    Elbow extension Slightly bent in resting position ~10 degrees  Can straighten arm out passively   Wrist flexion    Wrist extension    Wrist ulnar deviation    Wrist radial deviation    Wrist pronation    Wrist supination    (Blank rows = not  tested)  UPPER EXTREMITY MMT: MMT deferred due to time and post-op   MMT Right eval Left eval  Shoulder flexion    Shoulder extension    Shoulder abduction    Shoulder adduction    Shoulder internal rotation    Shoulder external rotation    Middle trapezius    Lower trapezius    Elbow flexion    Elbow extension    Wrist flexion    Wrist extension    Wrist ulnar deviation    Wrist radial deviation    Wrist pronation    Wrist supination    Grip strength (lbs)    (Blank rows = not tested) Not tested 2nd to recent surgery      PALPATION:  No unexpected TTP  Moderate spasming of the upper trap                                                                                                                             TREATMENT DATE:    1/21 PROM all planes GHJ distraction STM to biceps, lats, teres, UT  Supine wand flexion 2x10 Supine short arc active flexion x12 S/l abduction 2x10 (cues) S/l ER 2x10 (cues) Seated scap retraction 5 2x10 Pulleys flexion x34min HEP update and review   PATIENT EDUCATION: Education details: HEP, symptom management, progression of activity at home. Person educated: Patient Education method: Explanation, Demonstration, Tactile cues, Verbal cues, and Handouts Education comprehension: verbalized understanding, returned demonstration, verbal cues required, tactile cues required, and needs further education  HOME EXERCISE PROGRAM: Access Code: [email or web address] URL: [email or web address] Date: 05/27/2024 Prepared by: Alm Don  Exercises - Supine Shoulder Press with Dowel  - 1 x daily - 7 x weekly - 3 sets - 10 reps - Theracane Over Shoulder  - 1 x daily - 7 x weekly - 3 sets - 1-2 min  hold  ASSESSMENT:  CLINICAL IMPRESSION:  Pt stiff into end range flexion and abduction passively. She requires tactile and verbal cuing for [facility] with all exercises. Able to progress AAROM today. Challenged by AROM, but good performance with s/l AROM. Unable to complete full arc supine active flexion due to difficulty/discomfort. Pt cued to decrease shoulder hiking with UT stretching and pulley performance. Updated HEP to include progressions as pt may not make PT each week due to transportation. Pt instructed to avoid pushing past pain limits with exercises. Reviewed healing timeline and precautions.   Eval: Patient is a 69 year old female status post l right reverse total shoulder arthroplasty without subscap repair.  She presents with expected limitations in motion, function,  and strength.  She would benefit from skilled therapy to improve ability to use right upper extremity.  OBJECTIVE IMPAIRMENTS: decreased activity tolerance, decreased endurance, difficulty walking, decreased ROM, decreased strength, increased fascial restrictions, increased muscle spasms, impaired UE functional use, and pain.   ACTIVITY LIMITATIONS: carrying, lifting, sitting, transfers, dressing, and reach over head  PARTICIPATION LIMITATIONS: cleaning and shopping  PERSONAL FACTORS:  Age and Time since onset of injury/illness/exacerbation are also affecting patient's functional outcome.   REHAB POTENTIAL: Good  CLINICAL DECISION MAKING: Evolving/moderate complexity  EVALUATION COMPLEXITY: Moderate  GOALS: Goals reviewed with patient? Yes  SHORT TERM GOALS: Target date: 06/24/2024    Patient will increase active right shoulder flexion to 110 degrees  Baseline: Goal status: INITIAL  2.  Patient will increase passive right ER to 50 degrees  Baseline:  Goal status: INITIAL  3.  Patient will be independent with base HEP Baseline:  Goal status: INITIAL   LONG TERM GOALS: Target date: 07/22/2024    Patient will rch behind her head without pain  Baseline:  Goal status: INITIAL  2.  Patient will reach behind her back to L1 without pain  Baseline:  Goal status: INITIAL  3.  Patient will reach over the head with a 2lb weight to a shelf without pain  Baseline: Pt. Will increase strength in shoulder press by 1 lb and will increase endurance.  Goal status: INITIAL   PLAN: PT FREQUENCY: 2x/week  PT DURATION: 8 weeks  PLANNED INTERVENTIONS: 97110-Therapeutic exercises, 97530- Therapeutic activity, 97112- Neuromuscular re-education, 97535- Self Care, [id number]- Manual therapy, [phone]- Aquatic Therapy, [phone]- Ultrasound, [phone] (1-2 muscles), [phone] (3+ muscles)- Dry Needling, Patient/Family education, Joint mobilization, DME instructions, and Cryotherapy  PLAN FOR NEXT SESSION:   Begin PROM into available ranges flexion most limited  Begin AAROM next visit  Consider table slide wand flexion  Self flexion  Active ER  Scap retraction to neutral Patient 5 weeks post op to start     Asberry BRAVO Hesham Womac, PTA 06/09/2024, 10:46 AM  "

## 2024-06-22 ENCOUNTER — Ambulatory Visit (HOSPITAL_BASED_OUTPATIENT_CLINIC_OR_DEPARTMENT_OTHER): Admitting: Physical Therapy

## 2024-06-24 ENCOUNTER — Inpatient Hospital Stay: Admitting: Hematology and Oncology

## 2024-06-24 ENCOUNTER — Other Ambulatory Visit: Payer: Self-pay | Admitting: *Deleted

## 2024-06-24 ENCOUNTER — Ambulatory Visit

## 2024-06-24 ENCOUNTER — Inpatient Hospital Stay: Attending: Hematology and Oncology

## 2024-06-24 DIAGNOSIS — C50411 Malignant neoplasm of upper-outer quadrant of right female breast: Secondary | ICD-10-CM

## 2024-06-24 DIAGNOSIS — Z170 Estrogen receptor positive status [ER+]: Secondary | ICD-10-CM

## 2024-06-24 LAB — CMP (CANCER CENTER ONLY)
ALT: 6 U/L (ref 0–44)
AST: 16 U/L (ref 15–41)
Albumin: 4 g/dL (ref 3.5–5.0)
Alkaline Phosphatase: 121 U/L (ref 38–126)
Anion gap: 12 (ref 5–15)
BUN: 8 mg/dL (ref 8–23)
CO2: 21 mmol/L — ABNORMAL LOW (ref 22–32)
Calcium: 9.1 mg/dL (ref 8.9–10.3)
Chloride: 108 mmol/L (ref 98–111)
Creatinine: 0.62 mg/dL (ref 0.44–1.00)
GFR, Estimated: >60 mL/min
Glucose, Bld: 88 mg/dL (ref 70–99)
Potassium: 3.4 mmol/L — ABNORMAL LOW (ref 3.5–5.1)
Sodium: 142 mmol/L (ref 135–145)
Total Bilirubin: 0.5 mg/dL (ref 0.0–1.2)
Total Protein: 7.3 g/dL (ref 6.5–8.1)

## 2024-06-24 LAB — CBC WITH DIFFERENTIAL (CANCER CENTER ONLY)
Abs Immature Granulocytes: 0.02 10*3/uL (ref 0.00–0.07)
Basophils Absolute: 0 10*3/uL (ref 0.0–0.1)
Basophils Relative: 0 %
Eosinophils Absolute: 0.2 10*3/uL (ref 0.0–0.5)
Eosinophils Relative: 2 %
HCT: 34.2 % — ABNORMAL LOW (ref 36.0–46.0)
Hemoglobin: 11.2 g/dL — ABNORMAL LOW (ref 12.0–15.0)
Immature Granulocytes: 0 %
Lymphocytes Relative: 35 %
Lymphs Abs: 3 10*3/uL (ref 0.7–4.0)
MCH: 25.7 pg — ABNORMAL LOW (ref 26.0–34.0)
MCHC: 32.7 g/dL (ref 30.0–36.0)
MCV: 78.4 fL — ABNORMAL LOW (ref 80.0–100.0)
Monocytes Absolute: 0.5 10*3/uL (ref 0.1–1.0)
Monocytes Relative: 6 %
Neutro Abs: 4.7 10*3/uL (ref 1.7–7.7)
Neutrophils Relative %: 57 %
Platelet Count: 269 10*3/uL (ref 150–400)
RBC: 4.36 MIL/uL (ref 3.87–5.11)
RDW: 15.9 % — ABNORMAL HIGH (ref 11.5–15.5)
WBC Count: 8.4 10*3/uL (ref 4.0–10.5)
nRBC: 0 % (ref 0.0–0.2)

## 2024-06-24 LAB — MAGNESIUM: Magnesium: 1.6 mg/dL — ABNORMAL LOW (ref 1.7–2.4)

## 2024-06-24 NOTE — Progress Notes (Signed)
 "  Patient Care Team: Emilio Joesph VEAR DEVONNA as PCP - General (Physician Assistant) [name] Redell RAMAN, MD as PCP - Cardiology (Cardiology) Odean Potts, MD as Consulting Physician (Hematology and Oncology) Aron Shoulders, MD as Consulting Physician (General Surgery) Keenan Hastings, MD as Consulting Physician (Radiation Oncology) Bonner Ade, MD as Consulting Physician (Physical Medicine and Rehabilitation) [name], Redell RAMAN, MD as Consulting Physician (Cardiology) Caleen Dirks, MD as Consulting Physician (Internal Medicine)  DIAGNOSIS:  Encounter Diagnosis  Name Primary?   Malignant neoplasm of upper-outer quadrant of right breast in female, estrogen receptor positive (HCC) Yes    SUMMARY OF ONCOLOGIC HISTORY: Oncology History  Breast cancer of upper-outer quadrant of right female breast (HCC)  12/16/2013 Mammogram   1. A palpable 4.1 cm diameter mass/asymmetry with associated coarse heterogeneous microcalcifications at 11 o'clock 2 cm from the right nipple demonstrates mammographic and sonographic features highly suggestive of malignancy.    12/16/2013 Initial Biopsy   Breast cancer of upper-outer quadrant of right female breast; Invasive Ductal Cancer Er 100%, PR: 0%; Her2 Positive Ratio 4.28: Ki 67:24%;    12/23/2013 Breast MRI   Right Breast 4.4 x 3 x 2.5 cm and a sub cm satellite lesion. No LN   01/28/2014 - 05/27/2014 Neo-Adjuvant Chemotherapy   TC HP x2 cycles ( developed hand-foot syndrome) GC HP x1 cycle (developed dehydration and renal failure) CHP from 04/08/2014   07/21/2014 - 09/01/2014 Chemotherapy   Herceptin  maintenance therapy   07/28/2014 Surgery   Right breast mastectomy: DCIS 7 cm, 4 lymph nodes negative, no invasive breast cancer was identified, complete pathologic response   08/19/2014 - 04/20/2015 Anti-estrogen oral therapy   Anastrozole  1 mg daily stopped 09/23/2014 due to diffuse body aches, diarrhea, nausea, fatigue we switched her to tamoxifen  in 03/22/2015  stopped after 2 weeks with diarrhea, tiredness and nausea     CHIEF COMPLIANT: Follow-up to discuss her treatment plan  HISTORY OF PRESENT ILLNESS:  History of Present Illness [name] is a 69 year old female with ER+/HER2+ right breast cancer in remission who presents for oncology follow-up and management of medication side effects and anemia.  She is ten years post-diagnosis of ER+/HER2+ right breast cancer, treated with neoadjuvant chemotherapy, mastectomy, adjuvant trastuzumab , and endocrine therapy. She stopped letrozole  on 04/12/2024 for severe night sweats and persistent malaise. Since stopping, night sweats are now mild and localized, but malaise persists. She also stopped Zometa  due to post-infusion malaise and paused calcium  and vitamin D3 to recover from medication effects. Recent surveillance imaging, including a mammogram 2-3 months ago, shows no evidence of recurrence.  She has chronic nausea and significant abdominal discomfort that began about five weeks before right shoulder arthroplasty. Gastroenterology workup with stool studies, celiac and gluten testing, and pancreatic assessment was unremarkable. She links some symptoms to perioperative anxiety and notes improvement with daily electrolyte solutions. During periods of increased anxiety she has generalized weakness, hand tremors, and poor oral intake.  She has chronic microcytic anemia, with hemoglobin improving from 10.4 to 11.2 g/dL on oral ferrous sulfate. She understands her rheumatoid arthritis may contribute. Prior hypokalemia has improved. She denies symptoms that she feels would require intravenous iron.  She has significant daily anxiety related to ongoing health issues and recent surgeries. She often feels too weak or unwell for usual activities such as mowing or gardening and attributes some of this to aging and chronic conditions.  Mar 03, 2024: Surveillance visit for history of ER+ right breast cancer; patient  on letrozole  since May  2024 with hair thinning as main side effect, continues medication. Reports 40-pound unintentional weight loss over 6 months, improved with mirtazapine. June 2025 CT showed stable lung changes and parotid gland mass (Warthin's tumor). Osteoporosis managed with Zometa ; plan includes mammogram, dietary/physical activity recommendations, and annual CT for parotid gland.     ALLERGIES:  is allergic to pegfilgrastim  and codeine.  MEDICATIONS:  Current Outpatient Medications  Medication Sig Dispense Refill   atenolol  (TENORMIN ) 50 MG tablet TAKE 1 TABLET(50 MG) BY MOUTH TWICE DAILY 180 tablet 3   B Complex-C (B-COMPLEX WITH VITAMIN C) tablet Take 1 tablet by mouth 2 (two) times a week.     calcium -vitamin D  (OSCAL WITH D) 500-5 MG-MCG tablet Take 1 tablet by mouth.     cholecalciferol  (VITAMIN D3) 25 MCG (1000 UNIT) tablet Take 2,000 Units by mouth daily.     colestipol (COLESTID) 1 g tablet Take 2 g by mouth.     famotidine  (PEPCID ) 40 MG tablet Take 40 mg by mouth at bedtime.     fluticasone  (FLONASE ) 50 MCG/ACT nasal spray Place 2 sprays into both nostrils daily. 16 g 6   folic acid (FOLVITE) 1 MG tablet Take 1 mg by mouth daily.     gabapentin  (NEURONTIN ) 600 MG tablet Take 600 mg by mouth 3 (three) times daily.     mirtazapine (REMERON) 7.5 MG tablet Take 15 mg by mouth daily.     Multiple Vitamins-Minerals (WOMENS MULTIVITAMIN PO) Take 1 tablet by mouth 3 (three) times a week.     pantoprazole  (PROTONIX ) 40 MG tablet Take 40 mg by mouth 2 (two) times daily.     polyethylene glycol (MIRALAX  / GLYCOLAX ) 17 g packet Take 17 g by mouth daily as needed for moderate constipation.     sertraline  (ZOLOFT ) 100 MG tablet Take 100 mg by mouth daily.     Turmeric (QC TUMERIC COMPLEX PO) Take 1 tablet by mouth daily.     No current facility-administered medications for this visit.    PHYSICAL EXAMINATION: ECOG PERFORMANCE STATUS: 1 - Symptomatic but completely  ambulatory  There were no vitals filed for this visit. There were no vitals filed for this visit.   LABORATORY DATA:  I have reviewed the data as listed    Latest Ref Rng & Units 06/24/2024    9:08 AM 05/05/2024    5:12 PM 03/03/2024    8:24 AM  CMP  Glucose 70 - 99 mg/dL 88  87  93   BUN 8 - 23 mg/dL 8  9  9    Creatinine 0.44 - 1.00 mg/dL 9.37  9.45  9.17   Sodium 135 - 145 mmol/L 142  145  144   Potassium 3.5 - 5.1 mmol/L 3.4  3.0  4.0   Chloride 98 - 111 mmol/L 108  110  112   CO2 22 - 32 mmol/L 21  19  25    Calcium  8.9 - 10.3 mg/dL 9.1  9.2  9.5   Total Protein 6.5 - 8.1 g/dL 7.3  7.6  7.6   Total Bilirubin 0.0 - 1.2 mg/dL 0.5  0.4  0.4   Alkaline Phos 38 - 126 U/L 121  162  124   AST 15 - 41 U/L 16  19  15    ALT 0 - 44 U/L 6  11  6      Lab Results  Component Value Date   WBC 8.4 06/24/2024   HGB 11.2 (L) 06/24/2024   HCT 34.2 (L) 06/24/2024  MCV 78.4 (L) 06/24/2024   PLT 269 06/24/2024   NEUTROABS 4.7 06/24/2024    ASSESSMENT & PLAN:  Breast cancer of upper-outer quadrant of right female breast (HCC) Right breast invasive ductal carcinoma 4.4 cm in size with a satellite lesion based on MRI result there were no abnormal lymph nodes ER 100% positive PR 0% HER-2/neu positive (HER-2/CEP 17 ratio 4.28) grade 3 T2, N0, M0 clinical stage IIa. Status post neoadjuvant chemotherapy with TCHPx2, GCHPx1, CHPx1, HPx2 , complete pathologic response with only residual DCIS 7 cm remaining. Anastrozole  started 08/19/2014 stopped 09/18/2014, Herceptin  stopped April 2016    PET CT scan 10/28/2022: Hypermetabolic left upper lobe mass (3.1 x 4.6 cm) with hypermetabolic nodules in the right hemithorax (11 mm) and hypermetabolic ipsilateral mediastinal lymph node (AP window lymph node 5 mm). 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 Shelah.  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   Current treatment: Letrozole  (patient discontinued letrozole  November 2025) This is because of profuse night sweats.  She had been on it since 2016 and therefore she is fine to discontinue at this time.   Pain issues: Going to see pain management  05/21/2023: Postop sup trochanteric left femur fracture ORIF  Parotid gland mass: Patient does not desire for surgery.  We will do scans to see if there is any evidence of progression. Rheumatoid arthritis and severe osteoarthritis December 2025: Right shoulder replacement surgery   Microcytic anemia: Currently on iron supplement today's hemoglobin 11.2 MCV 78.4   10/29/2023: CT CAP: Significantly diminished consolidation left upper lobe previously seen subpleural nodule not measurable, unchanged right parotid mass 1.2 cm, emphysema, hepatic steatosis    Osteoporosis: Patient does not want to do Zometa  anymore because it makes her feel ill for several days  Follow-up in 1 year with scans Assessment & Plan Estrogen receptor positive right breast cancer in remission Ten years post-diagnosis with complete pathologic response. Remains in remission with no evidence of recurrence. Endocrine therapies discontinued due to intolerable side effects. Prognosis excellent. - Continue annual mammogram and CT scan; next imaging and labs scheduled for January next year. - Continue clinical follow-up every 6-12 months. - Instructed to contact clinic promptly if new symptoms or concerning changes arise. - Monitor for long-term effects of prior therapies, including osteoporosis, cardiac toxicity, menopausal symptoms, and sarcopenia. - Continue survivorship care and address rehabilitation needs as appropriate.  Osteoporosis Osteoporosis likely secondary to prior cancer therapy and menopause. Previously managed  with Zometa , discontinued due to side effects. Ongoing risk persists. - Discontinued Zometa  infusions due to side effects. - Encouraged resumption of calcium  and vitamin D3 supplementation. - Monitor bone density periodically as per guidelines. - Monitor for symptoms of fracture or bone pain.  Microcytic anemia Chronic microcytic anemia likely due to rheumatoid arthritis and prior chemotherapy. Improved with oral iron. No active bleeding or hemolysis. Potassium and renal function stable. - Continue oral iron supplementation. - Monitor hemoglobin and potassium levels. - Administer IV iron only if hemoglobin <10 g/dL.  Rheumatoid arthritis and osteoarthritis Longstanding rheumatoid arthritis and severe osteoarthritis with persistent pain and functional limitations. No further joint surgery planned due to fatigue with recovery and her preference. - Continue physical therapy for shoulder rehabilitation. - Continue acetaminophen  as needed for pain. - No further joint surgery planned at this time per her preference.  Orders Placed This Encounter  Procedures   CT CHEST ABDOMEN PELVIS W CONTRAST    Standing Status:   Future    Expected Date:   06/15/2025    Expiration Date:   09/13/2025    If indicated for the ordered procedure, I authorize the administration of contrast media per Radiology protocol:   Yes    Does the patient have a contrast media/X-ray dye allergy?:   No    Preferred imaging location?:   [facility]    Release to patient:   Immediate    If indicated for the ordered procedure, I authorize the administration of oral contrast media per Radiology protocol:   No    Reason for no oral contrast::   Breast   CBC with Differential (Cancer Center Only)    Standing Status:   Future    Expiration Date:   06/24/2025   CMP (Cancer Center only)    Standing Status:   Future    Expiration Date:   06/24/2025   The patient has a good understanding of the overall plan. she  agrees with it. she will call with any problems that may develop before the next visit here.  I personally spent a total of 30 minutes in the care of the patient today including preparing to see the patient, getting/reviewing separately obtained history, performing a medically appropriate exam/evaluation, counseling and educating, placing orders, referring and communicating with other health care professionals, documenting clinical information in the EHR, independently interpreting results, communicating results, and coordinating care.   Dr.Morrill Bomkamp 06/24/24    "

## 2024-06-24 NOTE — Assessment & Plan Note (Signed)
 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 Status post neoadjuvant chemotherapy with TCHPx2, GCHPx1, CHPx1, HPx2 , complete pathologic response with only residual DCIS 7 cm remaining. Anastrozole  started 08/19/2014 stopped 09/18/2014, Herceptin  stopped April 2016    PET CT scan 10/28/2022: Hypermetabolic left upper lobe mass (3.1 x 4.6 cm) with hypermetabolic nodules in the right hemithorax (11 mm) and hypermetabolic ipsilateral mediastinal lymph node (AP window lymph node 5 mm). 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 Shelah.  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   Current treatment: Letrozole   Letrozole  toxicities: tolerating it well   Pain issues: Going to see pain management History of left hip fracture Parotid gland mass: Patient does not desire for surgery.  We will do scans to see if there is any evidence of progression. Rheumatoid arthritis and severe osteoarthritis   Microcytic anemia: 12/24/2023: Hemoglobin 11.1, MCV 77, potassium 3.2, calcium  8.6   10/29/2023: CT CAP: Significantly diminished consolidation left upper lobe previously seen subpleural nodule not measurable, unchanged right parotid mass 1.2 cm, emphysema, hepatic steatosis   05/21/2023: Postop sup trochanteric left femur fracture ORIF Osteoporosis: Recommend Zometa  infusion every 6 months Plan for scans in June 2026 Follow-up after scans to discuss results

## 2024-06-29 ENCOUNTER — Ambulatory Visit (HOSPITAL_BASED_OUTPATIENT_CLINIC_OR_DEPARTMENT_OTHER): Admitting: Physical Therapy

## 2024-07-06 ENCOUNTER — Encounter (HOSPITAL_BASED_OUTPATIENT_CLINIC_OR_DEPARTMENT_OTHER): Admitting: Physical Therapy

## 2024-07-13 ENCOUNTER — Encounter (HOSPITAL_BASED_OUTPATIENT_CLINIC_OR_DEPARTMENT_OTHER): Admitting: Physical Therapy

## 2024-11-10 ENCOUNTER — Inpatient Hospital Stay

## 2024-11-10 ENCOUNTER — Inpatient Hospital Stay: Admitting: Hematology and Oncology

## 2025-06-15 ENCOUNTER — Inpatient Hospital Stay: Attending: Hematology and Oncology

## 2025-06-22 ENCOUNTER — Inpatient Hospital Stay: Attending: Hematology and Oncology | Admitting: Hematology and Oncology
